# Patient Record
Sex: Male | Born: 1955
Health system: Southern US, Community
[De-identification: ages and names within clinical notes are randomized; demographics above are authoritative.]

## PROBLEM LIST (undated history)

## (undated) DIAGNOSIS — E669 Obesity, unspecified: Secondary | ICD-10-CM

## (undated) DIAGNOSIS — E785 Hyperlipidemia, unspecified: Secondary | ICD-10-CM

## (undated) DIAGNOSIS — D171 Benign lipomatous neoplasm of skin and subcutaneous tissue of trunk: Secondary | ICD-10-CM

## (undated) DIAGNOSIS — Z9289 Personal history of other medical treatment: Secondary | ICD-10-CM

## (undated) DIAGNOSIS — I10 Essential (primary) hypertension: Secondary | ICD-10-CM

## (undated) DIAGNOSIS — I2510 Atherosclerotic heart disease of native coronary artery without angina pectoris: Secondary | ICD-10-CM

## (undated) DIAGNOSIS — I441 Atrioventricular block, second degree: Secondary | ICD-10-CM

## (undated) DIAGNOSIS — G4733 Obstructive sleep apnea (adult) (pediatric): Secondary | ICD-10-CM

## (undated) DIAGNOSIS — M549 Dorsalgia, unspecified: Secondary | ICD-10-CM

## (undated) DIAGNOSIS — Z7902 Long term (current) use of antithrombotics/antiplatelets: Secondary | ICD-10-CM

## (undated) DIAGNOSIS — F329 Major depressive disorder, single episode, unspecified: Secondary | ICD-10-CM

## (undated) DIAGNOSIS — G4730 Sleep apnea, unspecified: Secondary | ICD-10-CM

## (undated) DIAGNOSIS — F419 Anxiety disorder, unspecified: Secondary | ICD-10-CM

## (undated) DIAGNOSIS — B269 Mumps without complication: Secondary | ICD-10-CM

## (undated) DIAGNOSIS — B059 Measles without complication: Secondary | ICD-10-CM

## (undated) DIAGNOSIS — K219 Gastro-esophageal reflux disease without esophagitis: Secondary | ICD-10-CM

## (undated) DIAGNOSIS — M109 Gout, unspecified: Secondary | ICD-10-CM

## (undated) DIAGNOSIS — F3289 Other specified depressive episodes: Secondary | ICD-10-CM

## (undated) DIAGNOSIS — M1990 Unspecified osteoarthritis, unspecified site: Secondary | ICD-10-CM

## (undated) HISTORY — DX: Mumps without complication: B26.9

## (undated) HISTORY — PX: KNEE SURGERY: SHX244

## (undated) HISTORY — DX: Atrioventricular block, second degree: I44.1

## (undated) HISTORY — DX: Personal history of other medical treatment: Z92.89

## (undated) HISTORY — DX: Hyperlipidemia, unspecified: E78.5

## (undated) HISTORY — PX: ANTERIOR FUSION LUMBAR SPINE: SUR629

## (undated) HISTORY — DX: Essential (primary) hypertension: I10

## (undated) HISTORY — DX: Atherosclerotic heart disease of native coronary artery without angina pectoris: I25.10

## (undated) HISTORY — DX: Unspecified osteoarthritis, unspecified site: M19.90

## (undated) HISTORY — DX: Major depressive disorder, single episode, unspecified: F32.9

## (undated) HISTORY — DX: Sleep apnea, unspecified: G47.30

## (undated) HISTORY — DX: Gout, unspecified: M10.9

## (undated) HISTORY — DX: Measles without complication: B05.9

## (undated) HISTORY — DX: Benign lipomatous neoplasm of skin and subcutaneous tissue of trunk: D17.1

## (undated) HISTORY — DX: Anxiety disorder, unspecified: F41.9

## (undated) HISTORY — DX: Other specified depressive episodes: F32.89

## (undated) HISTORY — DX: Dorsalgia, unspecified: M54.9

## (undated) HISTORY — PX: TONSILLECTOMY AND ADENOIDECTOMY: SUR1326

## (undated) HISTORY — PX: TOTAL SHOULDER REPLACEMENT: SUR1217

## (undated) HISTORY — DX: Other hemochromatosis: E83.118

---

## 2002-06-24 ENCOUNTER — Encounter: Payer: Self-pay | Admitting: Neurosurgery

## 2002-06-24 ENCOUNTER — Encounter: Admission: RE | Admit: 2002-06-24 | Discharge: 2002-06-24 | Payer: Self-pay | Admitting: Neurosurgery

## 2002-07-08 ENCOUNTER — Encounter: Payer: Self-pay | Admitting: Neurosurgery

## 2002-07-08 ENCOUNTER — Encounter: Admission: RE | Admit: 2002-07-08 | Discharge: 2002-07-08 | Payer: Self-pay | Admitting: Neurosurgery

## 2002-07-23 ENCOUNTER — Encounter: Admission: RE | Admit: 2002-07-23 | Discharge: 2002-07-23 | Payer: Self-pay | Admitting: Neurosurgery

## 2002-07-23 ENCOUNTER — Encounter: Payer: Self-pay | Admitting: Neurosurgery

## 2003-01-23 HISTORY — PX: COLONOSCOPY: SHX174

## 2003-07-14 ENCOUNTER — Encounter: Admission: RE | Admit: 2003-07-14 | Discharge: 2003-07-14 | Payer: Self-pay | Admitting: Neurosurgery

## 2003-08-02 ENCOUNTER — Encounter: Admission: RE | Admit: 2003-08-02 | Discharge: 2003-08-02 | Payer: Self-pay | Admitting: Neurosurgery

## 2003-08-23 ENCOUNTER — Encounter: Admission: RE | Admit: 2003-08-23 | Discharge: 2003-08-23 | Payer: Self-pay | Admitting: Neurosurgery

## 2003-11-02 ENCOUNTER — Ambulatory Visit (HOSPITAL_COMMUNITY): Admission: RE | Admit: 2003-11-02 | Discharge: 2003-11-03 | Payer: Self-pay | Admitting: Neurosurgery

## 2005-02-04 ENCOUNTER — Emergency Department (HOSPITAL_COMMUNITY): Admission: EM | Admit: 2005-02-04 | Discharge: 2005-02-04 | Payer: Self-pay | Admitting: Emergency Medicine

## 2006-08-07 ENCOUNTER — Encounter: Admission: RE | Admit: 2006-08-07 | Discharge: 2006-08-07 | Payer: Self-pay | Admitting: Orthopaedic Surgery

## 2006-09-01 ENCOUNTER — Encounter: Admission: RE | Admit: 2006-09-01 | Discharge: 2006-09-01 | Payer: Self-pay | Admitting: Neurosurgery

## 2007-02-09 ENCOUNTER — Encounter: Admission: RE | Admit: 2007-02-09 | Discharge: 2007-02-09 | Payer: Self-pay | Admitting: Neurosurgery

## 2008-01-23 DIAGNOSIS — T8859XA Other complications of anesthesia, initial encounter: Secondary | ICD-10-CM

## 2008-01-23 HISTORY — DX: Other complications of anesthesia, initial encounter: T88.59XA

## 2008-05-28 ENCOUNTER — Encounter: Admission: RE | Admit: 2008-05-28 | Discharge: 2008-05-28 | Payer: Self-pay | Admitting: Neurosurgery

## 2010-01-22 HISTORY — PX: BACK SURGERY: SHX140

## 2010-06-09 NOTE — H&P (Signed)
NAMEKENDELL, SAGRAVES NO.:  [id number]   MEDICAL RECORD NO.:  [id number]          PATIENT TYPE:  OIB   LOCATION:  3013                         FACILITY:  [facility]   PHYSICIAN:  Payton Doughty, M.D.      DATE OF BIRTH:  10-15-1955   DATE OF ADMISSION:  11/02/2003  DATE OF DISCHARGE:  11/03/2003                                HISTORY & PHYSICAL   ADMISSION DIAGNOSIS:  Herniated disc on the right side at L4-L5.   HISTORY OF PRESENT ILLNESS:  This is a 55 year old, right-handed, white  gentleman who has had back pain for a number of years.  I actually saw him 1-  1/2 years ago with pain in his back with degenerative change.  He got  epidural steroids and did reasonably well.  I saw him back in August with  increased pain down his right leg with foraminal disc on the right.  He is  now admitted for a right foraminal diskectomy at L4-L5.   PAST MEDICAL HISTORY:  Reflux.   MEDICATIONS:  1.  Antiinflammatory twice a Trela.  2.  Nexium 40 mg a Roller.   ALLERGIES:  No known drug allergies.   PAST SURGICAL HISTORY:  1.  Left knee reconstruction in 2000.  2.  Knee scope in 2003.  3.  Shoulder operation in 1994.   SOCIAL HISTORY:  He does not smoke.  Drinks occasionally on a social basis.  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   FAMILY HISTORY:  Parents are deceased.  History is not given.   REVIEW OF SYSTEMS:  MUSCULOSKELETAL:  Leg pain while walking.  Broken leg at  the age of 40.  Leg weakness, back pain, joint pain and arthritis.  HEENT:  Within normal limits.  He has reasonable range of motion in neck.  CHEST:  Clear.  CARDIAC:  Regular rate and rhythm.  ABDOMEN:  Nontender with no hepatosplenomegaly.  EXTREMITIES:  Without clubbing, cyanosis or edema.  Peripheral pulses are  good.  GENITALIA:  Deferred.  NEUROLOGIC:  He is awake, alert and oriented.  His cranial nerves are  intact.  Motor exam shows 5/5 strength throughout the upper and lower  extremities with the dorsiflexors of the right foot and knee extensors in  the right leg which are pain limited 5-/5.  He has sensory dysesthesias in  the L5-S1 distribution.  Reflexes are 2 at the left knee, 1 at the right, 1  at the right ankle, absent at the left ankle.  Straight leg raise is  positive on the right.  Reverse straight leg raise is not positive.  MR  demonstrates spondylitic changes at L3-L4 and L4-L5 and L5-S1 and he has a  foraminal disc at L4-L5 eccentric to the right with compression at both the  L4-L5 root.   IMPRESSION:  Right L4 radiculopathy related to foraminal disc at L4-L5 on  the right.   PLAN:  Foraminal diskectomy on the right side at L4-L5.  The risks and  benefits of this approach have been discussed with him  and wishes to  proceed.       MWR/MEDQ  D:  11/02/2003  T:  11/02/2003  Job:  [id number]

## 2010-06-09 NOTE — Op Note (Signed)
NAMELORENZA, WINKLEMAN NO.:  [id number]   MEDICAL RECORD NO.:  [id number]          PATIENT TYPE:  OIB   LOCATION:  2899                         FACILITY:  [facility]   PHYSICIAN:  Payton Doughty, M.D.      DATE OF BIRTH:  Jun 02, 1955   DATE OF PROCEDURE:  11/02/2003  DATE OF DISCHARGE:                                 OPERATIVE REPORT   PREOPERATIVE DIAGNOSIS:  Herniated disk at L4-5 in the foraminal position on  the right side.   POSTOPERATIVE DIAGNOSIS:  Herniated disk at L4-5 in the foraminal position  on the right side.   OPERATION:  Right L4-5 extraforaminal diskectomy.   SURGEON:  Payton Doughty, M.D.   ANESTHESIA:  General endotracheal.   PREPARATION:  Prepped and sterilely draped with alcohol wipe.   COMPLICATIONS:  None.   NURSE ASSISTANT:  [place].   PHYSICIAN ASSISTANT:  Hewitt Shorts, M.D.   INDICATIONS FOR PROCEDURE:  This 55 year old gentleman with a right L4  radiculopathy related to foraminal narrowing and disk at L4-5 on the right.  He is taken to the operating room and smoothly anesthetized and intubated,  and placed prone on the operating room table.  Following a shave, prep and  drape in the usual sterile fashion, the skin was then prepared with 1%  lidocaine with 1:400,000 epinephrine.  A localizing needle was placed and an  x-ray obtained.  The skin was incised from the top of L4 to the bottom of  L4, the lamina of L4, and the transverse processes of L4 and L5 were exposed  on the right side in the subperiosteal plane.  Intraoperative x-ray once  again confirmed the correctness of the level.  Using the high-speed drill, a  bit of the superior part of the inferior facet of L5, as well as the lateral  portion of the pars interarticularis was removed.  The superior end of the  superior facet of L5 was removed.  This allowed access to the right L4  dorsal ganglion, after the removal of the ligament of flavum.  The dorsal  ganglion was  carefully dissected free, mobilized slightly inferiorly.  This  allowed access to the disk space.  Entrance into the disk space produced a  small amount of disk material, but no large fragment; however, the  decompressive portion allowed marked decompression of the dorsal ganglion.  The nerve root was carefully explored both medially into the canal and  laterally into the extra-spinous tissues and found to be free.  The wound  was irrigated and hemostasis assured.  The defect in the bone was filled  with Depo-Medrol-soaked fat.  The fascia was reapproximated with #0 Vicryl  in an interrupted fashion.  The subcutaneous tissues were  reapproximated with #0 Vicryl in an interrupted fashion.  The skin was  closed with #3-0 nylon in a simple running fashion.  Betadine and a Telfa  dressing were applied and made occlusive with Op-Site.  The patient returned to the recovery room in good condition.       MWR/MEDQ  D:  11/02/2003  T:  11/02/2003  Job:  [id number]

## 2010-07-12 ENCOUNTER — Ambulatory Visit: Payer: Self-pay | Admitting: General Practice

## 2010-10-16 DIAGNOSIS — M67919 Unspecified disorder of synovium and tendon, unspecified shoulder: Secondary | ICD-10-CM | POA: Insufficient documentation

## 2011-01-03 DIAGNOSIS — M19019 Primary osteoarthritis, unspecified shoulder: Secondary | ICD-10-CM | POA: Insufficient documentation

## 2011-04-03 ENCOUNTER — Other Ambulatory Visit: Payer: Self-pay | Admitting: Neurosurgery

## 2011-04-03 DIAGNOSIS — M47817 Spondylosis without myelopathy or radiculopathy, lumbosacral region: Secondary | ICD-10-CM

## 2011-05-16 ENCOUNTER — Ambulatory Visit: Payer: Self-pay | Admitting: Internal Medicine

## 2011-08-13 ENCOUNTER — Encounter: Payer: Self-pay | Admitting: Cardiovascular Disease

## 2011-08-20 ENCOUNTER — Encounter: Payer: Self-pay | Admitting: Cardiovascular Disease

## 2011-08-20 ENCOUNTER — Ambulatory Visit (INDEPENDENT_AMBULATORY_CARE_PROVIDER_SITE_OTHER): Payer: BC Managed Care – PPO | Admitting: Cardiovascular Disease

## 2011-08-20 VITALS — BP 134/84 | HR 66 | Ht 72.0 in | Wt 227.2 lb

## 2011-08-20 DIAGNOSIS — E785 Hyperlipidemia, unspecified: Secondary | ICD-10-CM

## 2011-08-20 DIAGNOSIS — I499 Cardiac arrhythmia, unspecified: Secondary | ICD-10-CM | POA: Insufficient documentation

## 2011-08-20 DIAGNOSIS — E782 Mixed hyperlipidemia: Secondary | ICD-10-CM | POA: Insufficient documentation

## 2011-08-20 MED ORDER — ATORVASTATIN CALCIUM 20 MG PO TABS: 20.0000 mg | ORAL_TABLET | Freq: Every day | ORAL | Status: DC

## 2011-08-20 NOTE — Progress Notes (Signed)
Patient ID: Robert Greer, male    DOB: 1955/08/22, 56 y.o.   MRN: [id number]  HPI Comments: Mr. 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  He reports in the setting of his anxiety and depression, was noted to have abnormal heart rhythm. He reports feeling palpitations on  rare episodes. Mostly he appreciated the abnormal rhythm by feeling his pulse. No dizziness, near syncope or syncope, no lightheadedness. Otherwise his energy has been good with no complaints. No chest pains or shortness of breath.  He has been on cholesterol medication for several years Recent lab work showed total cholesterol 110, LDL 41, HDL 48. This is on Lipitor 10 mg daily  He reports that his sleep is better. When the economy he was not doing well, he had significant anxiety  Several EKGs are provided today from Dr. Juanetta Gosling and [facility].  EKG today shows normal sinus rhythm with no significant ST or T wave changes, no ectopy Longer rhythm strip was also run that showed no significant abnormality, no arrhythmia  Review of previous EKGs showed sinus arrhythmia, APCs, frequently running in triplets,  Does not appear to be second-degree AV block type 2 as there are not clear nonconducted P waves. Original EKGs not available and image quality is fair  He had a normal stress test and echocardiogram. Echocardiogram report was not completed, ejection fraction unavailable but estimated at normal, 60% with no wall motion abnormality on stress test   Outpatient Encounter Prescriptions as of 08/20/2011  Medication Sig Dispense Refill  . atorvastatin (LIPITOR) 20 MG tablet Take 1 tablet (20 mg total) by mouth daily.  90 tablet  3  . cetirizine (ZYRTEC) 10 MG tablet Take 10 mg by mouth as needed.      Marland Kitchen escitalopram (LEXAPRO) 10 MG tablet Take 10 mg by mouth daily.       . meloxicam (MOBIC) 7.5 MG tablet Take 7.5 mg by mouth 2 (two) times daily.      . Multiple Vitamin (MULTIVITAMIN) tablet Take 1 tablet by mouth daily.       Review of Systems  Constitutional: Negative.   HENT: Negative.   Eyes: Negative.   Respiratory: Negative.   Cardiovascular: Negative.   Gastrointestinal: Negative.   Musculoskeletal: Negative.   Skin: Negative.   Neurological: Negative.   Hematological: Negative.   Psychiatric/Behavioral: Negative.   All other systems reviewed and are negative.    BP 134/84  Pulse 66  Ht 6' (1.829 m)  Wt 227 lb 4 oz (103.08 kg)  BMI 30.82 kg/m2  Physical Exam  Nursing note and vitals reviewed. Constitutional: He is oriented to person, place, and time. He appears well-developed and well-nourished.  HENT:  Head: Normocephalic.  Nose: Nose normal.  Mouth/Throat: Oropharynx is clear and moist.  Eyes: Conjunctivae are normal. Pupils are equal, round, and reactive to light.  Neck: Normal range of motion. Neck supple. No JVD present.  Cardiovascular: Normal rate, regular rhythm, S1 normal, S2 normal, normal heart sounds and intact distal pulses.  Exam reveals no gallop and no friction rub.   No murmur heard. Pulmonary/Chest: Effort normal and breath sounds normal. No respiratory distress. He has no wheezes. He has no rales. He exhibits no tenderness.  Abdominal: Soft. Bowel sounds are normal. He exhibits no distension. There is no tenderness.  Musculoskeletal: Normal range of motion. He  exhibits no edema and no tenderness.  Lymphadenopathy:    He has no cervical adenopathy.  Neurological: He is alert and oriented to person, place, and time. Coordination normal.  Skin: Skin is warm and dry. No rash noted. No erythema.  Psychiatric: He has a normal mood and affect. His behavior is normal. Judgment and thought content normal.           Assessment and Plan

## 2011-08-20 NOTE — Patient Instructions (Addendum)
You are doing well. No medication changes were made.  Please call us if you have new issues that need to be addressed before your next appt.  Your physician wants you to follow-up in: 12 months.  You will receive a reminder letter in the mail two months in advance. If you don't receive a letter, please call our office to schedule the follow-up appointment. 

## 2011-08-20 NOTE — Assessment & Plan Note (Signed)
Cholesterol is at goal on the current lipid regimen. No changes to the medications were made.  

## 2011-08-20 NOTE — Assessment & Plan Note (Signed)
No arrhythmia on today's visit. He feels it was brought on by stress and anxiety in the winter of 2012. He feels much better now. He reports not being able to feel any arrhythmia for quite some time.  Evaluation of the EKGs is concerning for sinus arrhythmia. Unable to clearly see consistent drop atrial conduction consistent with second-degree AV block type II. I suspect he had sinus arrhythmia, APCs, possible sinus pauses. As he was asymptomatic and arrhythmia has resolved, no intervention is needed. No further workup needed as he has had stress test and echo.

## 2012-08-26 ENCOUNTER — Telehealth: Payer: Self-pay | Admitting: *Deleted

## 2012-08-26 NOTE — Telephone Encounter (Signed)
Lmom to schedule a overdue appt.

## 2013-05-04 ENCOUNTER — Ambulatory Visit: Payer: Self-pay | Admitting: Neurosurgery

## 2013-06-17 DIAGNOSIS — C44321 Squamous cell carcinoma of skin of nose: Secondary | ICD-10-CM | POA: Insufficient documentation

## 2013-12-21 ENCOUNTER — Other Ambulatory Visit: Payer: Self-pay

## 2013-12-21 NOTE — Telephone Encounter (Signed)
Refill sent for atorvastatin. 

## 2014-01-22 DIAGNOSIS — I213 ST elevation (STEMI) myocardial infarction of unspecified site: Secondary | ICD-10-CM

## 2014-01-22 HISTORY — DX: ST elevation (STEMI) myocardial infarction of unspecified site: I21.3

## 2014-06-29 ENCOUNTER — Other Ambulatory Visit: Payer: Self-pay

## 2014-06-29 NOTE — Telephone Encounter (Signed)
Refill denied for atorvastatin. Patient should make a follow up with Dr. Rockey Situ.

## 2014-06-30 ENCOUNTER — Ambulatory Visit (INDEPENDENT_AMBULATORY_CARE_PROVIDER_SITE_OTHER): Payer: BLUE CROSS/BLUE SHIELD | Admitting: Family Medicine

## 2014-06-30 ENCOUNTER — Encounter: Payer: Self-pay | Admitting: Family Medicine

## 2014-06-30 VITALS — BP 133/81 | HR 79 | Temp 98.3°F | Resp 16 | Ht 72.0 in | Wt 237.6 lb

## 2014-06-30 DIAGNOSIS — M5442 Lumbago with sciatica, left side: Secondary | ICD-10-CM | POA: Diagnosis not present

## 2014-06-30 DIAGNOSIS — M545 Low back pain, unspecified: Secondary | ICD-10-CM | POA: Insufficient documentation

## 2014-06-30 DIAGNOSIS — E785 Hyperlipidemia, unspecified: Secondary | ICD-10-CM | POA: Diagnosis not present

## 2014-06-30 DIAGNOSIS — I499 Cardiac arrhythmia, unspecified: Secondary | ICD-10-CM | POA: Diagnosis not present

## 2014-06-30 DIAGNOSIS — R009 Unspecified abnormalities of heart beat: Secondary | ICD-10-CM

## 2014-06-30 DIAGNOSIS — M5441 Lumbago with sciatica, right side: Secondary | ICD-10-CM

## 2014-06-30 DIAGNOSIS — I470 Re-entry ventricular arrhythmia: Secondary | ICD-10-CM | POA: Diagnosis not present

## 2014-06-30 DIAGNOSIS — G8929 Other chronic pain: Secondary | ICD-10-CM | POA: Insufficient documentation

## 2014-06-30 LAB — EKG 12-LEAD

## 2014-06-30 NOTE — Progress Notes (Signed)
Name: Robert Greer   MRN: [id number]    DOB: 07/13/1955   Date:06/30/2014       Progress Note  Subjective  Chief Complaint  Chief Complaint  Patient presents with  . Annual Exam  . Depression  . Back Pain  . Hyperlipidemia    HPI  Here for annual physical. He has chronic back pain and sees Dr. Carloyn Manner in [place].  Also has seen Dr. Rockey Situ re: card arrhythmia.  His depression is under good control.   No problem-specific assessment & plan notes found for this encounter.   Past Medical History  Diagnosis Date  . Hyperlipidemia   . Anxiety   . Mobitz (type) II atrioventricular block   . Arthritis   . Backache, unspecified   . Depressive disorder, not elsewhere classified   . Hypertension   . Mumps   . Measles     Past Surgical History  Procedure Laterality Date  . Back surgery  2012    x2  . Tonsillectomy and adenoidectomy    . Knee surgery      bilateral  . Anterior fusion lumbar spine      History reviewed. No pertinent family history.  History   Social History  . Marital Status: Married    Spouse Name: N/A  . Number of Children: N/A  . Years of Education: N/A   Occupational History  . Not on file.   Social History Main Topics  . Smoking status: Never Smoker   . Smokeless tobacco: Current User    Types: Snuff  . Alcohol Use: 1.2 - 1.8 oz/week    2-3 Cans of beer per week     Comment: ocassionally  . Drug Use: No  . Sexual Activity: Not on file   Other Topics Concern  . Not on file   Social History Narrative     Current outpatient prescriptions:  .  atorvastatin (LIPITOR) 20 MG tablet, Take 1 tablet (20 mg total) by mouth daily., Disp: 90 tablet, Rfl: 3 .  cetirizine (ZYRTEC) 10 MG tablet, Take 10 mg by mouth as needed., Disp: , Rfl:  .  DULoxetine (CYMBALTA) 20 MG capsule, , Disp: , Rfl: 0 .  meloxicam (MOBIC) 7.5 MG tablet, Take 7.5 mg by mouth 2 (two) times daily., Disp: , Rfl:  .  Multiple Vitamin (MULTIVITAMIN) tablet, Take 1 tablet by mouth  daily., Disp: , Rfl:  .  oxyCODONE (OXY IR/ROXICODONE) 5 MG immediate release tablet, Take 5 mg by mouth., Disp: , Rfl:   No Known Allergies   Review of Systems  Constitutional: Negative.  Negative for fever, weight loss and malaise/fatigue.  HENT: Negative for congestion and sore throat.   Eyes: Negative.  Negative for double vision.  Respiratory: Negative.  Negative for cough, shortness of breath and wheezing.   Cardiovascular: Negative.   Gastrointestinal: Negative.  Negative for heartburn, nausea, vomiting, diarrhea, constipation and blood in stool.  Genitourinary: Negative.  Negative for dysuria and frequency.  Musculoskeletal: Positive for back pain and neck pain.  Skin: Negative.  Negative for rash.  Neurological: Negative.  Negative for tingling, seizures, loss of consciousness, weakness and headaches.  Endo/Heme/Allergies: Negative.  Negative for polydipsia.  Psychiatric/Behavioral: Negative.  Negative for depression. The patient is not nervous/anxious and does not have insomnia.       Objective  Filed Vitals:   06/30/14 1412  BP: 133/81  Pulse: 79  Temp: 98.3 F (36.8 C)  TempSrc: Oral  Resp: 16  Height: 6' (1.829  m)  Weight: 237 lb 9.6 oz (107.775 kg)    Physical Exam  Constitutional: He is oriented to person, place, and time and well-developed, well-nourished, and in no distress. No distress.  HENT:  Head: Normocephalic and atraumatic.  Right Ear: External ear normal.  Left Ear: External ear normal.  Nose: Nose normal.  Mouth/Throat: Oropharynx is clear and moist.  Eyes: Conjunctivae are normal. Pupils are equal, round, and reactive to light. No scleral icterus.  Neck: Normal range of motion. Neck supple. No tracheal deviation present. No thyromegaly present.  Cardiovascular: Normal rate, regular rhythm, normal heart sounds and intact distal pulses.  Exam reveals no gallop and no friction rub.   No murmur heard. Pulmonary/Chest: Breath sounds normal. No  respiratory distress. He has no wheezes. He has no rales. He exhibits no tenderness.  Abdominal: Soft. Bowel sounds are normal. He exhibits no mass. There is no tenderness.  Genitourinary: Rectum normal, prostate normal and penis normal. No discharge found.  Musculoskeletal: Normal range of motion. He exhibits no edema.  Lymphadenopathy:    He has no cervical adenopathy.  Neurological: He is alert and oriented to person, place, and time.  Skin: Skin is warm and dry. No rash noted.  Psychiatric: Memory, affect and judgment normal.  Vitals reviewed.     Recent Results (from the past 2160 hour(s))  EKG 12-Lead     Status: Normal   Collection Time: 06/30/14  3:30 PM  Result Value Ref Range                                . SR PACS     Comment: NSR with PACs.     Assessment & Plan  Problem List Items Addressed This Visit      Cardiovascular and Mediastinum   Arrhythmia     Other   Low back pain (Chronic)   Relevant Medications   oxyCODONE (OXY IR/ROXICODONE) 5 MG immediate release tablet   Hyperlipidemia   Relevant Orders   Lipid Profile   COMPLETE METABOLIC PANEL WITH GFR    Other Visit Diagnoses    Heart beat abnormality    -  Primary    Relevant Orders    CBC with Differential    EKG 12-Lead (Completed)       Meds ordered this encounter  Medications  . DISCONTD: HYDROcodone-acetaminophen (NORCO/VICODIN) 5-325 MG per tablet    Sig: Take by mouth.  . oxyCODONE (OXY IR/ROXICODONE) 5 MG immediate release tablet    Sig: Take 5 mg by mouth.  . DISCONTD: meloxicam (MOBIC) 7.5 MG tablet    Sig: Take 7.5 mg by mouth.  . DULoxetine (CYMBALTA) 20 MG capsule    Sig:     Refill:  0

## 2014-08-25 ENCOUNTER — Encounter: Payer: Self-pay | Admitting: Cardiovascular Disease

## 2014-08-25 ENCOUNTER — Encounter (INDEPENDENT_AMBULATORY_CARE_PROVIDER_SITE_OTHER): Payer: Self-pay

## 2014-08-25 ENCOUNTER — Ambulatory Visit (INDEPENDENT_AMBULATORY_CARE_PROVIDER_SITE_OTHER): Payer: BLUE CROSS/BLUE SHIELD | Admitting: Cardiovascular Disease

## 2014-08-25 VITALS — BP 130/78 | HR 77 | Ht 72.0 in | Wt 239.2 lb

## 2014-08-25 DIAGNOSIS — E785 Hyperlipidemia, unspecified: Secondary | ICD-10-CM | POA: Diagnosis not present

## 2014-08-25 DIAGNOSIS — I470 Re-entry ventricular arrhythmia: Secondary | ICD-10-CM

## 2014-08-25 NOTE — Assessment & Plan Note (Signed)
He denies any palpitations concerning for arrhythmia. Suspect prior palpitations were in the setting of anxiety/stress. No changes needed to the medications. No further testing needed

## 2014-08-25 NOTE — Progress Notes (Signed)
Patient ID: Robert Greer, male    DOB: March 03, 1955, 59 y.o.   MRN: [id number]  HPI Comments: Mr. 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 Denies having any problems with depression or anxiety, this was during the period of the recession During this time he had palpitations. Since then he has felt well with no further arrhythmia or palpitations No chest pains or shortness of breath He has been on cholesterol medication for several years Most recent lab work not available, this has been done through the city   on Lipitor 20 mg daily, total cholesterol 165, HDL 58, LDL 87, TSH 3.6  EKG on today's visit shows normal sinus rhythm with rate 77 bpm, no significant ST or T-wave changes  Other past medical history Several EKGs are provided today from Dr. Luan Pulling and [facility]  EKG today shows normal sinus rhythm with no significant ST or T wave changes, no ectopy Longer rhythm strip was also run that showed no significant abnormality, no arrhythmia  Review of previous EKGs showed sinus arrhythmia, APCs, frequently running in triplets,  Does not appear to be second-degree AV block type 2 as there are not clear nonconducted P waves. Original EKGs not available and image quality is fair  He had a normal stress test and echocardiogram. Echocardiogram report was not completed, ejection fraction unavailable but estimated at normal, 60% with no wall motion abnormality on stress test  No  Known Allergies  Current Outpatient Prescriptions on File Prior to Visit  Medication Sig Dispense Refill  . atorvastatin (LIPITOR) 20 MG tablet Take 1 tablet (20 mg total) by mouth daily. 90 tablet 3  . cetirizine (ZYRTEC) 10 MG tablet Take 10 mg by mouth as needed.    . DULoxetine (CYMBALTA) 20 MG capsule Take 20 mg by mouth 2 (two) times daily.   0  . meloxicam (MOBIC) 7.5 MG tablet Take 7.5 mg by mouth 2 (two) times daily.    . Multiple Vitamin (MULTIVITAMIN) tablet Take 1 tablet by mouth daily.    Marland Kitchen oxyCODONE (OXY IR/ROXICODONE) 5 MG immediate release tablet Take 5 mg by mouth daily as needed.      No current facility-administered medications on file prior to visit.    Past Medical History  Diagnosis Date  . Hyperlipidemia   . Anxiety   . Mobitz (type) II atrioventricular block   . Arthritis   . Backache, unspecified   . Depressive disorder, not elsewhere classified   . Hypertension   . Mumps   . Measles     Past Surgical History  Procedure Laterality Date  . Back surgery  2012    x2  . Tonsillectomy and adenoidectomy    . Knee surgery      bilateral  . Anterior fusion lumbar spine    . Total shoulder replacement Right     Social History  reports that he has never smoked. His smokeless tobacco use includes Snuff. He reports that he drinks about 1.2 - 1.8 oz of  alcohol per week. He reports that he does not use illicit drugs.  Family History family history is not on file.   Review of Systems  Constitutional: Negative.   Respiratory: Negative.   Cardiovascular: Negative.   Gastrointestinal: Negative.   Musculoskeletal: Positive for arthralgias.  Skin: Negative.   Neurological: Negative.   Psychiatric/Behavioral: Negative.   All other systems reviewed and are negative.   BP 130/78 mmHg  Pulse 77  Ht 6' (1.829 m)  Wt 239 lb 4 oz (108.523 kg)  BMI 32.44 kg/m2  Physical Exam  Constitutional: He is oriented to person, place, and time. He appears  well-developed and well-nourished.  HENT:  Head: Normocephalic.  Nose: Nose normal.  Mouth/Throat: Oropharynx is clear and moist.  Eyes: Conjunctivae are normal. Pupils are equal, round, and reactive to light.  Neck: Normal range of motion. Neck supple. No JVD present.  Cardiovascular: Normal rate, regular rhythm, S1 normal, S2 normal, normal heart sounds and intact distal pulses.  Exam reveals no gallop and no friction rub.   No murmur heard. Pulmonary/Chest: Effort normal and breath sounds normal. No respiratory distress. He has no wheezes. He has no rales. He exhibits no tenderness.  Abdominal: Soft. Bowel sounds are normal. He exhibits no distension. There is no tenderness.  Musculoskeletal: Normal range of motion. He exhibits no edema or tenderness.  Lymphadenopathy:    He has no cervical adenopathy.  Neurological: He is alert and oriented to person, place, and time. Coordination normal.  Skin: Skin is warm and dry. No rash noted. No erythema.  Psychiatric: He has a normal mood and affect. His behavior is normal. Judgment and thought content normal.      Assessment and Plan   Nursing note and vitals reviewed.

## 2014-08-25 NOTE — Patient Instructions (Signed)
You are doing well. No medication changes were made.  Please call us if you have new issues that need to be addressed before your next appt.  Your physician wants you to follow-up in: 12 months.  You will receive a reminder letter in the mail two months in advance. If you don't receive a letter, please call our office to schedule the follow-up appointment. 

## 2014-08-25 NOTE — Assessment & Plan Note (Addendum)
Cholesterol labs were requested from primary care doctor's office, Dr. Luan Pulling . These numbers were reviewed.  We will Encourage him to stay on his current dose of Lipitor. Cholesterol under excellent control No known coronary artery disease If he is concerned about underlying coronary artery disease, coronary calcium score could be ordered in [place].

## 2014-08-25 NOTE — Addendum Note (Signed)
Addended by: Minna Merritts on: 08/25/2014 04:44 PM   Modules accepted: Level of Service

## 2014-10-27 ENCOUNTER — Telehealth: Payer: Self-pay | Admitting: Family Medicine

## 2014-10-27 NOTE — Telephone Encounter (Signed)
Advised as dr. Luan Pulling-- his stung was from yesterday an Left arm and he will follow advise as per Dr. Luan Pulling and suggested to call office if not resolved  by week.

## 2014-10-27 NOTE — Telephone Encounter (Signed)
Cont. Benadryl, 1-2 up to 4 times a Mahadeo as need.  Use ice to swollen area 4 times a Brickel.-jh

## 2014-10-27 NOTE — Telephone Encounter (Signed)
LMTCB

## 2014-10-27 NOTE — Telephone Encounter (Signed)
Pt was stung by wasp on his hand.  His hand is swelling and going up arm.  He is taking benadryl but doesn't seem to be helping.  Please call [phone]

## 2015-01-07 ENCOUNTER — Encounter: Payer: Self-pay | Admitting: Emergency Medicine

## 2015-01-07 ENCOUNTER — Emergency Department: Payer: Medicare Other

## 2015-01-07 ENCOUNTER — Other Ambulatory Visit: Payer: Self-pay | Admitting: *Deleted

## 2015-01-07 ENCOUNTER — Inpatient Hospital Stay
Admission: EM | Admit: 2015-01-07 | Discharge: 2015-01-11 | DRG: 247 | Disposition: A | Discharge status: Home / Self Care | Payer: Medicare Other | Attending: Internal Medicine | Admitting: Internal Medicine

## 2015-01-07 DIAGNOSIS — R079 Chest pain, unspecified: Secondary | ICD-10-CM

## 2015-01-07 DIAGNOSIS — R7989 Other specified abnormal findings of blood chemistry: Secondary | ICD-10-CM | POA: Diagnosis not present

## 2015-01-07 DIAGNOSIS — E785 Hyperlipidemia, unspecified: Secondary | ICD-10-CM | POA: Diagnosis present

## 2015-01-07 DIAGNOSIS — Z79899 Other long term (current) drug therapy: Secondary | ICD-10-CM | POA: Diagnosis not present

## 2015-01-07 DIAGNOSIS — F17290 Nicotine dependence, other tobacco product, uncomplicated: Secondary | ICD-10-CM | POA: Diagnosis present

## 2015-01-07 DIAGNOSIS — I214 Non-ST elevation (NSTEMI) myocardial infarction: Secondary | ICD-10-CM

## 2015-01-07 DIAGNOSIS — F329 Major depressive disorder, single episode, unspecified: Secondary | ICD-10-CM | POA: Diagnosis present

## 2015-01-07 DIAGNOSIS — Z96611 Presence of right artificial shoulder joint: Secondary | ICD-10-CM | POA: Diagnosis present

## 2015-01-07 DIAGNOSIS — I2510 Atherosclerotic heart disease of native coronary artery without angina pectoris: Secondary | ICD-10-CM | POA: Diagnosis not present

## 2015-01-07 DIAGNOSIS — M1990 Unspecified osteoarthritis, unspecified site: Secondary | ICD-10-CM | POA: Diagnosis present

## 2015-01-07 DIAGNOSIS — I10 Essential (primary) hypertension: Secondary | ICD-10-CM | POA: Diagnosis present

## 2015-01-07 DIAGNOSIS — I441 Atrioventricular block, second degree: Secondary | ICD-10-CM | POA: Diagnosis present

## 2015-01-07 DIAGNOSIS — M549 Dorsalgia, unspecified: Secondary | ICD-10-CM | POA: Diagnosis present

## 2015-01-07 DIAGNOSIS — R072 Precordial pain: Secondary | ICD-10-CM

## 2015-01-07 DIAGNOSIS — I200 Unstable angina: Secondary | ICD-10-CM | POA: Diagnosis not present

## 2015-01-07 DIAGNOSIS — I252 Old myocardial infarction: Secondary | ICD-10-CM

## 2015-01-07 DIAGNOSIS — R778 Other specified abnormalities of plasma proteins: Secondary | ICD-10-CM

## 2015-01-07 HISTORY — DX: Non-ST elevation (NSTEMI) myocardial infarction: I21.4

## 2015-01-07 LAB — BASIC METABOLIC PANEL
Anion gap: 8 (ref 5–15)
BUN: 17 mg/dL (ref 6–20)
CHLORIDE: 103 mmol/L (ref 101–111)
CO2: 28 mmol/L (ref 22–32)
Calcium: 9.1 mg/dL (ref 8.9–10.3)
Creatinine, Ser: 1.03 mg/dL (ref 0.61–1.24)
GFR calc Af Amer: >60 mL/min (ref >60)
GFR calc non Af Amer: >60 mL/min (ref >60)
GLUCOSE: 101 mg/dL — AB (ref 65–99)
POTASSIUM: 4.5 mmol/L (ref 3.5–5.1)
Sodium: 139 mmol/L (ref 135–145)

## 2015-01-07 LAB — TROPONIN I
TROPONIN I: 0.03 ng/mL (ref <0.031)
TROPONIN I: 0.63 ng/mL — AB (ref <0.031)
TROPONIN I: 3.16 ng/mL — AB (ref <0.031)

## 2015-01-07 LAB — CBC
HEMATOCRIT: 46.1 % (ref 40.0–52.0)
HEMOGLOBIN: 15.3 g/dL (ref 13.0–18.0)
MCH: 30.4 pg (ref 26.0–34.0)
MCHC: 33.1 g/dL (ref 32.0–36.0)
MCV: 91.8 fL (ref 80.0–100.0)
PLATELETS: 264 10*3/uL (ref 150–440)
RBC: 5.02 MIL/uL (ref 4.40–5.90)
RDW: 12.8 % (ref 11.5–14.5)
WBC: 8.5 10*3/uL (ref 3.8–10.6)

## 2015-01-07 LAB — TSH: TSH: 7.329 u[IU]/mL — ABNORMAL HIGH (ref 0.350–4.500)

## 2015-01-07 LAB — APTT: aPTT: 30 seconds (ref 24–36)

## 2015-01-07 MED ORDER — ATORVASTATIN CALCIUM 20 MG PO TABS: 20.0000 mg | ORAL_TABLET | Freq: Every day | ORAL | Status: DC

## 2015-01-07 MED ORDER — ONDANSETRON HCL 4 MG PO TABS: 4.0000 mg | ORAL_TABLET | Freq: Four times a day (QID) | ORAL | Status: DC | PRN

## 2015-01-07 MED ORDER — ATORVASTATIN CALCIUM 20 MG PO TABS
20.0000 mg | ORAL_TABLET | Freq: Every day | ORAL | Status: DC
  Administered 2015-01-07 – 2015-01-08 (×2): 20 mg via ORAL
  Filled 2015-01-07 (×2): qty 1

## 2015-01-07 MED ORDER — DOCUSATE SODIUM 100 MG PO CAPS
100.0000 mg | ORAL_CAPSULE | Freq: Two times a day (BID) | ORAL | Status: DC
  Administered 2015-01-07 – 2015-01-11 (×7): 100 mg via ORAL
  Filled 2015-01-07 (×7): qty 1

## 2015-01-07 MED ORDER — GI COCKTAIL ~~LOC~~
ORAL | Status: AC
  Filled 2015-01-07: qty 30

## 2015-01-07 MED ORDER — NITROGLYCERIN 2 % TD OINT
0.5000 [in_us] | TOPICAL_OINTMENT | Freq: Four times a day (QID) | TRANSDERMAL | Status: DC
  Administered 2015-01-07 – 2015-01-10 (×2): 0.5 [in_us] via TOPICAL
  Filled 2015-01-07 (×2): qty 1

## 2015-01-07 MED ORDER — NITROGLYCERIN 2 % TD OINT
1.0000 [in_us] | TOPICAL_OINTMENT | Freq: Once | TRANSDERMAL | Status: AC
  Administered 2015-01-07: 1 [in_us] via TOPICAL
  Filled 2015-01-07: qty 1

## 2015-01-07 MED ORDER — HEPARIN BOLUS VIA INFUSION
4000.0000 [IU] | Freq: Once | INTRAVENOUS | Status: AC
  Administered 2015-01-07: 4000 [IU] via INTRAVENOUS
  Filled 2015-01-07: qty 4000

## 2015-01-07 MED ORDER — OXYCODONE HCL 5 MG PO TABS
5.0000 mg | ORAL_TABLET | Freq: Every day | ORAL | Status: DC | PRN
  Administered 2015-01-07 – 2015-01-10 (×2): 5 mg via ORAL
  Filled 2015-01-07 (×2): qty 1

## 2015-01-07 MED ORDER — MORPHINE SULFATE (PF) 4 MG/ML IV SOLN: 4.0000 mg | INTRAVENOUS | Status: DC | PRN

## 2015-01-07 MED ORDER — ONDANSETRON HCL 4 MG/2ML IJ SOLN: 4.0000 mg | Freq: Four times a day (QID) | INTRAMUSCULAR | Status: DC | PRN

## 2015-01-07 MED ORDER — ACETAMINOPHEN 325 MG PO TABS
650.0000 mg | ORAL_TABLET | Freq: Once | ORAL | Status: AC
  Administered 2015-01-07: 650 mg via ORAL
  Filled 2015-01-07: qty 2

## 2015-01-07 MED ORDER — ASPIRIN 81 MG PO CHEW
CHEWABLE_TABLET | ORAL | Status: AC
  Filled 2015-01-07: qty 4

## 2015-01-07 MED ORDER — GI COCKTAIL ~~LOC~~
30.0000 mL | Freq: Once | ORAL | Status: AC
  Administered 2015-01-07: 30 mL via ORAL

## 2015-01-07 MED ORDER — METOPROLOL TARTRATE 25 MG PO TABS
25.0000 mg | ORAL_TABLET | Freq: Four times a day (QID) | ORAL | Status: DC
  Administered 2015-01-07 – 2015-01-10 (×7): 25 mg via ORAL
  Filled 2015-01-07 (×9): qty 1

## 2015-01-07 MED ORDER — ASPIRIN 81 MG PO CHEW: 324.0000 mg | CHEWABLE_TABLET | Freq: Once | ORAL | Status: DC

## 2015-01-07 MED ORDER — DULOXETINE HCL 20 MG PO CPEP
20.0000 mg | ORAL_CAPSULE | Freq: Two times a day (BID) | ORAL | Status: DC
  Administered 2015-01-07 – 2015-01-11 (×7): 20 mg via ORAL
  Filled 2015-01-07 (×7): qty 1

## 2015-01-07 MED ORDER — LORATADINE 10 MG PO TABS
10.0000 mg | ORAL_TABLET | Freq: Every day | ORAL | Status: DC
  Administered 2015-01-07 – 2015-01-11 (×4): 10 mg via ORAL
  Filled 2015-01-07 (×4): qty 1

## 2015-01-07 MED ORDER — SODIUM CHLORIDE 0.9 % IJ SOLN
3.0000 mL | Freq: Two times a day (BID) | INTRAMUSCULAR | Status: DC
  Administered 2015-01-07 – 2015-01-09 (×4): 3 mL via INTRAVENOUS

## 2015-01-07 MED ORDER — ACETAMINOPHEN 650 MG RE SUPP: 650.0000 mg | Freq: Four times a day (QID) | RECTAL | Status: DC | PRN

## 2015-01-07 MED ORDER — ASPIRIN EC 325 MG PO TBEC
325.0000 mg | DELAYED_RELEASE_TABLET | Freq: Every day | ORAL | Status: DC
  Administered 2015-01-07 – 2015-01-09 (×3): 325 mg via ORAL
  Filled 2015-01-07 (×3): qty 1

## 2015-01-07 MED ORDER — GI COCKTAIL ~~LOC~~: 30.0000 mL | Freq: Once | ORAL | Status: DC

## 2015-01-07 MED ORDER — HEPARIN (PORCINE) IN NACL 100-0.45 UNIT/ML-% IJ SOLN
1300.0000 [IU]/h | INTRAMUSCULAR | Status: DC
  Administered 2015-01-07: 1300 [IU]/h via INTRAVENOUS
  Filled 2015-01-07 (×2): qty 250

## 2015-01-07 MED ORDER — ACETAMINOPHEN 325 MG PO TABS
650.0000 mg | ORAL_TABLET | Freq: Four times a day (QID) | ORAL | Status: DC | PRN
  Administered 2015-01-08: 650 mg via ORAL
  Filled 2015-01-07: qty 2

## 2015-01-07 NOTE — ED Notes (Signed)
Patient states improvement after taking GI cocktail.

## 2015-01-07 NOTE — H&P (Signed)
Robert Greer at [place] NAME: Robert Greer    MR#:  [id number]  DATE OF BIRTH:  Nov 21, 1955  DATE OF ADMISSION:  01/07/2015  PRIMARY CARE PHYSICIAN: Dicky Doe, MD   REQUESTING/REFERRING PHYSICIAN:   CHIEF COMPLAINT:   Chief Complaint  Patient presents with  . Chest Pain    HISTORY OF PRESENT ILLNESS: Robert Greer  is a 59 y.o. male with a known history of lipidemia, unspecified arrhythmia for which patient underwent cardiac workup approximately 40 years ago with Dr. Candis Musa who presents to the hospital with complaints of chest pain. Patient does believe he was driving at around 1 PM and started having tightness in the midsternal area radiating to his neck and jaws. Pain was very intense, 9-10 out of 10 by intensity and lasted for 45 minutes. Even now in emergency room , 7 hours later, he still has some discomfort in his chest. Pain was accompanied by nausea as well as dyspnea and the diaphoresis. Patient's EKG in the emergency room was unremarkable except of nonspecific ST-T abnormality. Patient's second troponin was found to be elevated at 0.63 and hospitalist services were contacted for admission of this patient with unstable angina. Of note, patient's chest pain subsided with GI cocktail.   PAST MEDICAL HISTORY:   Past Medical History  Diagnosis Date  . Hyperlipidemia   . Anxiety   . Mobitz (type) II atrioventricular block   . Arthritis   . Backache, unspecified   . Depressive disorder, not elsewhere classified   . Hypertension   . Mumps   . Measles     PAST SURGICAL HISTORY:  Past Surgical History  Procedure Laterality Date  . Back surgery  2012    x2  . Tonsillectomy and adenoidectomy    . Knee surgery      bilateral  . Anterior fusion lumbar spine    . Total shoulder replacement Right     SOCIAL HISTORY:  Social History  Substance Use Topics  . Smoking status: Never Smoker   . Smokeless tobacco: Current User     Types: Snuff  . Alcohol Use: 1.2 - 1.8 oz/week    2-3 Cans of beer per week     Comment: ocassionally    FAMILY HISTORY: No family history on file.  DRUG ALLERGIES: No Known Allergies  Review of Systems  Constitutional: Negative for fever, chills, weight loss and malaise/fatigue.  HENT: Negative for congestion.   Eyes: Negative for blurred vision and double vision.  Respiratory: Negative for cough, sputum production, shortness of breath and wheezing.   Cardiovascular: Positive for chest pain. Negative for palpitations, orthopnea, leg swelling and PND.  Gastrointestinal: Positive for nausea. Negative for vomiting, abdominal pain, diarrhea, constipation, blood in stool and melena.  Genitourinary: Negative for dysuria, urgency, frequency and hematuria.  Musculoskeletal: Negative for falls.  Skin: Negative for rash.  Neurological: Negative for dizziness and weakness.  Psychiatric/Behavioral: Negative for depression and memory loss. The patient is not nervous/anxious.     MEDICATIONS AT HOME:  Prior to Admission medications   Medication Sig Start Date End Date Taking? Authorizing Provider  atorvastatin (LIPITOR) 20 MG tablet Take 1 tablet (20 mg total) by mouth daily. 01/07/15   Minna Merritts, MD  cetirizine (ZYRTEC) 10 MG tablet Take 10 mg by mouth as needed.    Historical Provider, MD  DULoxetine (CYMBALTA) 20 MG capsule Take 20 mg by mouth 2 (two) times daily.  06/28/14   Historical  Provider, MD  meloxicam (MOBIC) 7.5 MG tablet Take 7.5 mg by mouth 2 (two) times daily.    Historical Provider, MD  Multiple Vitamin (MULTIVITAMIN) tablet Take 1 tablet by mouth daily.    Historical Provider, MD  oxyCODONE (OXY IR/ROXICODONE) 5 MG immediate release tablet Take 5 mg by mouth daily as needed.  10/30/11   Historical Provider, MD      PHYSICAL EXAMINATION:   VITAL SIGNS: Blood pressure 127/83, pulse 64, temperature 98.4 F (36.9 C), temperature source Oral, resp. rate 18, height 6' (1.829  m), weight 106.595 kg (235 lb), SpO2 98 %.  GENERAL:  59 y.o.-year-old patient lying in the bed with no acute distress.  EYES: Pupils equal, round, reactive to light and accommodation. No scleral icterus. Extraocular muscles intact.  HEENT: Head atraumatic, normocephalic. Oropharynx and nasopharynx clear.  NECK:  Supple, no jugular venous distention. No thyroid enlargement, no tenderness.  LUNGS: Normal breath sounds bilaterally, no wheezing, rales,rhonchi or crepitation. No use of accessory muscles of respiration.  CARDIOVASCULAR: S1, S2 normal. No murmurs, rubs, or gallops.  ABDOMEN: Soft, nontender, nondistended. Bowel sounds present. No organomegaly or mass.  EXTREMITIES: No pedal edema, cyanosis, or clubbing.  NEUROLOGIC: Cranial nerves II through XII are intact. Muscle strength 5/5 in all extremities. Sensation intact. Gait not checked.  PSYCHIATRIC: The patient is alert and oriented x 3.  SKIN: No obvious rash, lesion, or ulcer.   LABORATORY PANEL:   CBC  Recent Labs Lab 01/07/15 1454  WBC 8.5  HGB 15.3  HCT 46.1  PLT 264  MCV 91.8  MCH 30.4  MCHC 33.1  RDW 12.8   ------------------------------------------------------------------------------------------------------------------  Chemistries   Recent Labs Lab 01/07/15 1454  NA 139  K 4.5  CL 103  CO2 28  GLUCOSE 101*  BUN 17  CREATININE 1.03  CALCIUM 9.1   ------------------------------------------------------------------------------------------------------------------  Cardiac Enzymes  Recent Labs Lab 01/07/15 1454 01/07/15 1800  TROPONINI 0.03 0.63*   ------------------------------------------------------------------------------------------------------------------  RADIOLOGY: Dg Chest 2 View  01/07/2015  CLINICAL DATA:  59 year old male with chest pain radiating to the jaw and left upper extremity. Initial encounter. EXAM: CHEST  2 VIEW COMPARISON:  Chest radiographs 10/28/2003 FINDINGS: Lung  volumes remain within normal limits. Normal cardiac size and mediastinal contours. Visualized tracheal air column is within normal limits. No pneumothorax, pulmonary edema, pleural effusion or confluent pulmonary opacity. No acute osseous abnormality identified. Right shoulder arthroplasty is new since 2005. IMPRESSION: No acute cardiopulmonary abnormality. Electronically Signed   By: Genevie Ann M.D.   On: 01/07/2015 15:24    EKG: Orders placed or performed during the hospital encounter of 01/07/15  . EKG 12-Lead  . EKG 12-Lead  . ED EKG within 10 minutes  . ED EKG within 10 minutes    IMPRESSION AND PLAN:  Active Problems:   Unstable angina ([place]) 1. Unstable angina. Admit patient to medical floor. Initiate him on metoprolol, aspirin, nitroglycerin topically and the heparin intravenously checkwriting enzymes 3 days, cardiologist, Dr. Candis Musa to see patient in consultation, possible cardiac catheterization. 2. Elevated blood pressure without history of hypertension, subsided with chest pain management, follow closely and continue patient on metoprolol as well as nitroglycerin, may need to be on antihypertensives at home 3. Hyperlipidemia. Continue patient on the Lipitor. Check lipid panel 4. History of depression. Continue patient on Cymbalta   All the records are reviewed and case discussed with ED provider. Management plans discussed with the patient, family and they are in agreement.  CODE STATUS: Advance Directive  Documentation        Most Recent Value   Type of Advance Directive  Healthcare Power of Attorney, Living will   Pre-existing out of facility DNR order (yellow form or pink MOST form)     "MOST" Form in Place?         TOTAL TIME TAKING CARE OF THIS PATIENT: 55 minutes.    Theodoro Grist M.D on 01/07/2015 at 8:01 PM  Between 7am to 6pm - Pager - [phone] After 6pm go to [email or web address] - password [email or web address] [facility]  Office   [phone]  CC: Primary care physician; Dicky Doe, MD

## 2015-01-07 NOTE — ED Notes (Signed)
Patient transported to x-ray. ?

## 2015-01-07 NOTE — ED Provider Notes (Signed)
[facility] Emergency Department Provider Note   ____________________________________________  Time seen:  I have reviewed the triage vital signs and the triage nursing note.  HISTORY  Chief Complaint Chest Pain   Historian Patient, wife and daughter  HPI Robert Greer is a 59 y.o. male who is here for evaluation of an episode of substernal chest pain which was described as pressure and associated with nausea and sweating that occurred around 1:30 or so. He checked his blood pressure at that point in time and his blood pressure was about 160/100. He does not have a history of hypertension. He is not a smoker. No family history of coronary artery disease. He has had a stress test probably several years ago. He is followed with Dr. Candis Musa in the past due to irregular heartbeat, but was cleared from that - was not reportedly afib, was "2 beats."  He's had a history of GERD and had been on Nexium but this was about 10 years ago. His initial impression was that this was an episode of GERD.  Symptoms were severe at first, and currently are mild. He does not typically take aspirin.      Past Medical History  Diagnosis Date  . Hyperlipidemia   . Anxiety   . Mobitz (type) II atrioventricular block   . Arthritis   . Backache, unspecified   . Depressive disorder, not elsewhere classified   . Hypertension   . Mumps   . Measles     Patient Active Problem List   Diagnosis Date Noted  . Low back pain 06/30/2014  . Squamous cell carcinoma of nose ([place]) 06/17/2013  . Arrhythmia 08/20/2011  . Hyperlipidemia 08/20/2011    Past Surgical History  Procedure Laterality Date  . Back surgery  2012    x2  . Tonsillectomy and adenoidectomy    . Knee surgery      bilateral  . Anterior fusion lumbar spine    . Total shoulder replacement Right     Current Outpatient Rx  Name  Route  Sig  Dispense  Refill  . atorvastatin (LIPITOR) 20 MG tablet   Oral   Take 1 tablet  (20 mg total) by mouth daily.   90 tablet   3   . cetirizine (ZYRTEC) 10 MG tablet   Oral   Take 10 mg by mouth as needed.         . DULoxetine (CYMBALTA) 20 MG capsule   Oral   Take 20 mg by mouth 2 (two) times daily.       0   . meloxicam (MOBIC) 7.5 MG tablet   Oral   Take 7.5 mg by mouth 2 (two) times daily.         . Multiple Vitamin (MULTIVITAMIN) tablet   Oral   Take 1 tablet by mouth daily.         Marland Kitchen oxyCODONE (OXY IR/ROXICODONE) 5 MG immediate release tablet   Oral   Take 5 mg by mouth daily as needed.            Allergies Review of patient's allergies indicates no known allergies.  No family history on file.  Social History Social History  Substance Use Topics  . Smoking status: Never Smoker   . Smokeless tobacco: Current User    Types: Snuff  . Alcohol Use: 1.2 - 1.8 oz/week    2-3 Cans of beer per week     Comment: ocassionally    Review of Systems  Constitutional: Negative for fever. Eyes: Negative for visual changes. ENT: Negative for sore throat. Cardiovascular: Positive for chest pain. Respiratory: Negative for shortness of breath. Gastrointestinal: Negative for abdominal pain, vomiting and diarrhea. Genitourinary: Negative for dysuria. Musculoskeletal: Negative for back pain. Skin: Negative for rash. Neurological: Negative for headache. 10 point Review of Systems otherwise negative ____________________________________________   PHYSICAL EXAM:  VITAL SIGNS: ED Triage Vitals  Enc Vitals Group     BP 01/07/15 1453 148/88 mmHg     Pulse Rate 01/07/15 1453 73     Resp 01/07/15 1453 18     Temp 01/07/15 1453 98.4 F (36.9 C)     Temp Source 01/07/15 1453 Oral     SpO2 01/07/15 1453 96 %     Weight 01/07/15 1450 235 lb (106.595 kg)     Height 01/07/15 1450 6' (1.829 m)     Head Cir --      Peak Flow --      Pain Score 01/07/15 1451 8     Pain Loc --      Pain Edu? --      Excl. in [place]? --      Constitutional: Alert  and oriented. Well appearing and in no distress. Eyes: Conjunctivae are normal. PERRL. Normal extraocular movements. ENT   Head: Normocephalic and atraumatic.   Nose: No congestion/rhinnorhea.   Mouth/Throat: Mucous membranes are moist.   Neck: No stridor. Cardiovascular/Chest: Normal rate, regular rhythm.  No murmurs, rubs, or gallops. Respiratory: Normal respiratory effort without tachypnea nor retractions. Breath sounds are clear and equal bilaterally. No wheezes/rales/rhonchi. Gastrointestinal: Soft. No distention, no guarding, no rebound. Nontender   Genitourinary/rectal:Deferred Musculoskeletal: Nontender with normal range of motion in all extremities. No joint effusions.  No lower extremity tenderness.  No edema. Neurologic:  Normal speech and language. No gross or focal neurologic deficits are appreciated. Skin:  Skin is warm, dry and intact. No rash noted. Psychiatric: Mood and affect are normal. Speech and behavior are normal. Patient exhibits appropriate insight and judgment.  ____________________________________________   EKG I, Lisa Roca, MD, the attending physician have personally viewed and interpreted all ECGs.  72 bpm. Normal sinus rhythm. Narrow QRS. Normal axis. Nonspecific ST/T-wave ____________________________________________  LABS (pertinent positives/negatives)  Basic metabolic panel, CBC are within normal limits Troponin 0.03 Repeat troponin 0.63  ____________________________________________  RADIOLOGY All Xrays were viewed by me. Imaging interpreted by Radiologist.  Chest x-ray two-view: No acute cardio pulmonary abnormality. __________________________________________  PROCEDURES  Procedure(s) performed: None  Critical Care performed: None  ____________________________________________   ED COURSE / ASSESSMENT AND PLAN  CONSULTATIONS: I spoke with Dr. Fletcher Anon, [facility] cardiology - set up for appointment, on Monday after initial  troponin negative in preparation for possible discharge. However once troponin repeat came back elevated, patient was admitted to the hospitalist.      Pertinent labs & imaging results that were available during my care of the patient were reviewed by me and considered in my medical decision making (see chart for details).     Patient's symptoms sound like they could be related to an episode of indigestion, however could also be related to cardiac cause based on description of symptoms. His initial EKG is without signs of acute ischemia. His initial laboratory evaluation is negative including troponin 0.03. He has risk factor for coronary artery disease of hyperlipidemia, and I will refer him for outpatient follow-up for likely stress testing. He received aspirin by fire department prior to arrival to the ED.  Patient  was treated in the emergency department with a GI cocktail. This gave him good relief. He is just complaining of feeling worn out.  I repeated a troponin, and this came back elevated 0.63. I discussed this with the patient and start him on heparin given the symptoms could have been related to acute coronary syndrome with now the elevated troponin, he'll be treated with a blood thinner until repeat troponin. I discussed the case with the hospitalist for admission.    Patient / Family / Caregiver informed of clinical course, medical decision-making process, and agree with plan.    ___________________________________________   FINAL CLINICAL IMPRESSION(S) / ED DIAGNOSES   Final diagnoses:  Precordial pain  Troponin I above reference range       Lisa Roca, MD 01/07/15 1929

## 2015-01-07 NOTE — Progress Notes (Signed)
Patient arrived to 2A Room 238. Patient denies pain and all questions answered. Patient oriented to unit and Fall Safety Plan signed. Skin assessment completed with Harriette Bouillon. Skin intact. Nursing staff will continue to monitor. Earleen Reaper, RN

## 2015-01-07 NOTE — ED Notes (Signed)
Pt states he had taken lunch to his wife today, states he broke out in a sweat, pain began midsternal chest, throat, and jaw. Pt called fire dept, EKG was "good" according to them, pt states his bp was elevated and his sugar was high. Pt anxious in triage, still having neck and jaw pain now. Pt asking for GI cocktail.

## 2015-01-07 NOTE — Progress Notes (Signed)
ANTICOAGULATION CONSULT NOTE - Initial Consult  Pharmacy Consult for Heparin  Indication: chest pain/ACS  No Known Allergies  Patient Measurements: Height: 6' (182.9 cm) Weight: 235 lb (106.595 kg) IBW/kg (Calculated) : 77.6 Heparin Dosing Weight: 99.9 kg  Vital Signs: Temp: 98.4 F (36.9 C) (12/16 1453) Temp Source: Oral (12/16 1453) BP: 127/83 mmHg (12/16 1930) Pulse Rate: 64 (12/16 1930)  Labs:  Recent Labs  01/07/15 1454 01/07/15 1800  HGB 15.3  --   HCT 46.1  --   PLT 264  --   APTT 30  --   CREATININE 1.03  --   TROPONINI 0.03 0.63*    Estimated Creatinine Clearance: 95 mL/min (by C-G formula based on Cr of 1.03).   Medical History: Past Medical History  Diagnosis Date  . Hyperlipidemia   . Anxiety   . Mobitz (type) II atrioventricular block   . Arthritis   . Backache, unspecified   . Depressive disorder, not elsewhere classified   . Hypertension   . Mumps   . Measles     Medications:   (Not in a hospital admission)  Assessment: CrCl = 95 ml/min Goal of Therapy:  Heparin level 0.3-0.7 units/ml Monitor platelets by anticoagulation protocol: Yes   Plan:  Give 4000 units bolus x 1 Start heparin infusion at 1300 units/hr  Will draw 1st HL 6 hrs after start of drip on 12/17 @ 1:30. Will check CBC daily.   Robert Greer D 01/07/2015,7:49 PM

## 2015-01-07 NOTE — ED Notes (Signed)
Second troponin collected and sent to lab.

## 2015-01-08 ENCOUNTER — Inpatient Hospital Stay (HOSPITAL_COMMUNITY)
Admit: 2015-01-08 | Discharge: 2015-01-08 | Disposition: A | Discharge status: Home / Self Care | Payer: Medicare Other | Attending: Cardiology | Admitting: Cardiology

## 2015-01-08 DIAGNOSIS — I214 Non-ST elevation (NSTEMI) myocardial infarction: Principal | ICD-10-CM

## 2015-01-08 DIAGNOSIS — R079 Chest pain, unspecified: Secondary | ICD-10-CM

## 2015-01-08 LAB — CBC
HEMATOCRIT: 43 % (ref 40.0–52.0)
Hemoglobin: 14.2 g/dL (ref 13.0–18.0)
MCH: 30.4 pg (ref 26.0–34.0)
MCHC: 32.9 g/dL (ref 32.0–36.0)
MCV: 92.4 fL (ref 80.0–100.0)
Platelets: 224 10*3/uL (ref 150–440)
RBC: 4.66 MIL/uL (ref 4.40–5.90)
RDW: 12.8 % (ref 11.5–14.5)
WBC: 8.8 10*3/uL (ref 3.8–10.6)

## 2015-01-08 LAB — HEPARIN LEVEL (UNFRACTIONATED)
HEPARIN UNFRACTIONATED: 0.33 [IU]/mL (ref 0.30–0.70)
HEPARIN UNFRACTIONATED: 0.14 [IU]/mL — AB (ref 0.30–0.70)
HEPARIN UNFRACTIONATED: 0.65 [IU]/mL (ref 0.30–0.70)
Heparin Unfractionated: 0.2 IU/mL — ABNORMAL LOW (ref 0.30–0.70)

## 2015-01-08 LAB — TROPONIN I
TROPONIN I: 9.69 ng/mL — AB (ref <0.031)
Troponin I: 10.08 ng/mL — ABNORMAL HIGH (ref <0.031)
Troponin I: 9.73 ng/mL — ABNORMAL HIGH (ref <0.031)
Troponin I: 9.58 ng/mL — ABNORMAL HIGH (ref <0.031)

## 2015-01-08 LAB — LIPID PANEL
CHOLESTEROL: 149 mg/dL (ref 0–200)
HDL: 48 mg/dL (ref >40)
LDL CALC: 78 mg/dL (ref 0–99)
TRIGLYCERIDES: 114 mg/dL (ref <150)
Total CHOL/HDL Ratio: 3.1 RATIO
VLDL: 23 mg/dL (ref 0–40)

## 2015-01-08 LAB — PROTIME-INR
INR: 1.01
Prothrombin Time: 13.5 seconds (ref 11.4–15.0)

## 2015-01-08 LAB — HEMOGLOBIN A1C: HEMOGLOBIN A1C: 5.5 % (ref 4.0–6.0)

## 2015-01-08 LAB — BASIC METABOLIC PANEL
ANION GAP: 7 (ref 5–15)
BUN: 17 mg/dL (ref 6–20)
CHLORIDE: 109 mmol/L (ref 101–111)
CO2: 24 mmol/L (ref 22–32)
Calcium: 8.5 mg/dL — ABNORMAL LOW (ref 8.9–10.3)
Creatinine, Ser: 1 mg/dL (ref 0.61–1.24)
Glucose, Bld: 102 mg/dL — ABNORMAL HIGH (ref 65–99)
POTASSIUM: 4.4 mmol/L (ref 3.5–5.1)
SODIUM: 140 mmol/L (ref 135–145)

## 2015-01-08 MED ORDER — CLOPIDOGREL BISULFATE 75 MG PO TABS
300.0000 mg | ORAL_TABLET | Freq: Once | ORAL | Status: AC
  Administered 2015-01-08: 300 mg via ORAL
  Filled 2015-01-08: qty 4

## 2015-01-08 MED ORDER — ATORVASTATIN CALCIUM 20 MG PO TABS
80.0000 mg | ORAL_TABLET | Freq: Every day | ORAL | Status: DC
  Administered 2015-01-09 – 2015-01-11 (×2): 80 mg via ORAL
  Filled 2015-01-08 (×2): qty 4

## 2015-01-08 MED ORDER — HEPARIN BOLUS VIA INFUSION
1500.0000 [IU] | Freq: Once | INTRAVENOUS | Status: AC
  Administered 2015-01-08: 1500 [IU] via INTRAVENOUS
  Filled 2015-01-08: qty 1500

## 2015-01-08 MED ORDER — CLOPIDOGREL BISULFATE 75 MG PO TABS
75.0000 mg | ORAL_TABLET | Freq: Every day | ORAL | Status: DC
  Administered 2015-01-09: 75 mg via ORAL
  Filled 2015-01-08: qty 1

## 2015-01-08 MED ORDER — HEPARIN (PORCINE) IN NACL 100-0.45 UNIT/ML-% IJ SOLN
1500.0000 [IU]/h | INTRAMUSCULAR | Status: DC
  Administered 2015-01-08: 1500 [IU]/h via INTRAVENOUS
  Filled 2015-01-08 (×3): qty 250

## 2015-01-08 MED ORDER — IBUPROFEN 200 MG PO TABS: 400.0000 mg | ORAL_TABLET | Freq: Four times a day (QID) | ORAL | Status: DC | PRN

## 2015-01-08 MED ORDER — HEPARIN (PORCINE) IN NACL 100-0.45 UNIT/ML-% IJ SOLN
1800.0000 [IU]/h | INTRAMUSCULAR | Status: DC
  Administered 2015-01-08 – 2015-01-10 (×3): 1800 [IU]/h via INTRAVENOUS
  Filled 2015-01-08 (×7): qty 250

## 2015-01-08 MED ORDER — HEPARIN BOLUS VIA INFUSION
3000.0000 [IU] | Freq: Once | INTRAVENOUS | Status: AC
  Administered 2015-01-08: 3000 [IU] via INTRAVENOUS
  Filled 2015-01-08: qty 3000

## 2015-01-08 NOTE — Consult Note (Addendum)
CARDIOLOGY CONSULT NOTE     Primary Care Physician: Dicky Doe, MD Referring Physician:  Admit Date: 01/07/2015  Reason for consultation:  Robert Greer is a 59 y.o. male with a h/o hyperlipidemia who presents to [facility] complaining of chest pain. The chest pain started yesterday as he was driving home. He states that the pain is in the middle of his chest and radiated to his neck and was a pressure type pain. He also had heartburn-type feelings and sweating. He returned home and his son called the rescue squad. Per the patient the rescue squad noted no changes on his EKG but suggested that he go to the hospital. His son drove him to the hospital and on arrival his chest pain subsided with the GI cocktail. His troponins were noted to be elevated and he was started on IV heparin and admitted to the hospital. Currently he feels well without any chest pain.  Today, he denies symptoms of palpitations, chest pain, shortness of breath, orthopnea, PND, lower extremity edema, dizziness, presyncope, syncope, or neurologic sequela. The patient is tolerating medications without difficulties and is otherwise without complaint today.   Past Medical History  Diagnosis Date  . Hyperlipidemia   . Anxiety   . Mobitz (type) II atrioventricular block   . Arthritis   . Backache, unspecified   . Depressive disorder, not elsewhere classified   . Hypertension   . Mumps   . Measles    Past Surgical History  Procedure Laterality Date  . Back surgery  2012    x2  . Tonsillectomy and adenoidectomy    . Knee surgery      bilateral  . Anterior fusion lumbar spine    . Total shoulder replacement Right     . aspirin EC  325 mg Oral Daily  . atorvastatin  20 mg Oral Daily  . docusate sodium  100 mg Oral BID  . DULoxetine  20 mg Oral BID  . loratadine  10 mg Oral Daily  . metoprolol tartrate  25 mg Oral Q6H  . nitroGLYCERIN  0.5 inch Topical 4 times per [place]  . sodium chloride  3 mL  Intravenous Q12H   . heparin 1,300 Units/hr (01/07/15 1925)    No Known Allergies  Social History   Social History  . Marital Status: Married    Spouse Name: N/A  . Number of Children: N/A  . Years of Education: N/A   Occupational History  . Not on file.   Social History Main Topics  . Smoking status: Never Smoker   . Smokeless tobacco: Current User    Types: Snuff  . Alcohol Use: 1.2 - 1.8 oz/week    2-3 Cans of beer per week     Comment: ocassionally  . Drug Use: No  . Sexual Activity: Not on file   Other Topics Concern  . Not on file   Social History Narrative    No family history on file.  ROS- All systems are reviewed and negative except as per the HPI above  Physical Exam: Telemetry: Filed Vitals:   01/07/15 2051 01/08/15 0138 01/08/15 0526 01/08/15 0805  BP: 139/89  110/71 123/74  Pulse: 68 60 59 63  Temp: 98.1 F (36.7 C)  98 F (36.7 C)   TempSrc: Oral  Oral   Resp: 18  22   Height:      Weight:      SpO2: 96%  99%     GEN- The patient  is well appearing, alert and oriented x 3 today.   Head- normocephalic, atraumatic Eyes-  Sclera clear, conjunctiva pink Ears- hearing intact Oropharynx- clear Neck- supple, no JVP Lymph- no cervical lymphadenopathy Lungs- Clear to ausculation bilaterally, normal work of breathing Heart- Regular rate and rhythm, no murmurs, rubs or gallops, PMI not laterally displaced GI- soft, NT, ND, + BS Extremities- no clubbing, cyanosis, or edema MS- no significant deformity or atrophy Skin- no rash or lesion Psych- euthymic mood, full affect Neuro- strength and sensation are intact  EKG: sinus rhythm, < 50mm coved ST elevation in II, no other ST-T wave changes  Labs:   Lab Results  Component Value Date   WBC 8.8 01/08/2015   HGB 14.2 01/08/2015   HCT 43.0 01/08/2015   MCV 92.4 01/08/2015   PLT 224 01/08/2015    Recent Labs Lab 01/08/15 0225  NA 140  K 4.4  CL 109  CO2 24  BUN 17  CREATININE 1.00    CALCIUM 8.5*  GLUCOSE 102*   Lab Results  Component Value Date   TROPONINI 10.08* 01/08/2015    Lab Results  Component Value Date   CHOL 149 01/08/2015   Lab Results  Component Value Date   HDL 48 01/08/2015   Lab Results  Component Value Date   LDLCALC 78 01/08/2015   Lab Results  Component Value Date   TRIG 114 01/08/2015   Lab Results  Component Value Date   CHOLHDL 3.1 01/08/2015   No results found for: LDLDIRECT    Radiology:CXR No acute cardiopulmonary abnormality.  Echo: pending  ASSESSMENT AND PLAN:   1. NSTEMI: With his history of chest pain and elevated troponins and without acute changes on his EKG he is currently having an an NSTEMI. He more Troponins are currently at 10 and had not yet peaked. We'll continue him on IV heparin and start 300 mg of Plavix followed by 75 mg per [place]. We will set him up to have a cardiac cath with possible intervention on Monday.  Will order a TTE today to assess LV function.  2. Hyperlipidemia: Will increase his atorvastatin to 80 mg.    Will Meredith Leeds, MD 01/08/2015  10:11 AM

## 2015-01-08 NOTE — Progress Notes (Signed)
[facility] at [place] NAME: Beniamin Mcknight    MR#:  [id number]  DATE OF BIRTH:  [birth year]  SUBJECTIVE:  CHIEF COMPLAINT:   Chief Complaint  Patient presents with  . Chest Pain   Continues to have slight substernal chest pain. No shortness of breath.  REVIEW OF SYSTEMS:   Review of Systems  Constitutional: Negative for fever.  Respiratory: Negative for shortness of breath.   Cardiovascular: Positive for chest pain. Negative for palpitations.  Gastrointestinal: Negative for nausea, vomiting and abdominal pain.  Genitourinary: Negative for dysuria.   DRUG ALLERGIES:  No Known Allergies  VITALS:  Blood pressure 120/75, pulse 67, temperature 98 F (36.7 C), temperature source Oral, resp. rate 18, height 6' (1.829 m), weight 106.595 kg (235 lb), SpO2 95 %.  PHYSICAL EXAMINATION:  GENERAL:  59 y.o.-year-old patient lying in the bed with no acute distress.  LUNGS: Normal breath sounds bilaterally, no wheezing, rales,rhonchi or crepitation. No use of accessory muscles of respiration.  CARDIOVASCULAR: S1, S2 normal. No murmurs, rubs, or gallops.  ABDOMEN: Soft, nontender, nondistended. Bowel sounds present. No organomegaly or mass.  EXTREMITIES: No pedal edema, cyanosis, or clubbing.  NEUROLOGIC: Cranial nerves II through XII are intact. Muscle strength 5/5 in all extremities. Sensation intact. Gait not checked.  PSYCHIATRIC: The patient is alert and oriented x 3.  SKIN: No obvious rash, lesion, or ulcer.   LABORATORY PANEL:   CBC  Recent Labs Lab 01/08/15 0225  WBC 8.8  HGB 14.2  HCT 43.0  PLT 224   ------------------------------------------------------------------------------------------------------------------  Chemistries   Recent Labs Lab 01/08/15 0225  NA 140  K 4.4  CL 109  CO2 24  GLUCOSE 102*  BUN 17  CREATININE 1.00  CALCIUM 8.5*    ------------------------------------------------------------------------------------------------------------------  Cardiac Enzymes  Recent Labs Lab 01/08/15 0924  TROPONINI 9.73*   ------------------------------------------------------------------------------------------------------------------  RADIOLOGY:  Dg Chest 2 View  01/07/2015  CLINICAL DATA:  59 year old male with chest pain radiating to the jaw and left upper extremity. Initial encounter. EXAM: CHEST  2 VIEW COMPARISON:  Chest radiographs 10/28/2003 FINDINGS: Lung volumes remain within normal limits. Normal cardiac size and mediastinal contours. Visualized tracheal air column is within normal limits. No pneumothorax, pulmonary edema, pleural effusion or confluent pulmonary opacity. No acute osseous abnormality identified. Right shoulder arthroplasty is new since 2005. IMPRESSION: No acute cardiopulmonary abnormality. Electronically Signed   By: Genevie Ann M.D.   On: 01/07/2015 15:24    EKG:   Orders placed or performed during the hospital encounter of 01/07/15  . EKG 12-Lead  . EKG 12-Lead  . ED EKG within 10 minutes  . ED EKG within 10 minutes    ASSESSMENT AND PLAN:   1. NSTEMI - Pain controlled - Appreciate cardiology consultation - Continue IV heparin, Plavix started, cardiac catheterization planned for Monday - Echo ordered - Continue to cycle cardiac enzymes  2. Hyperlipidemia: - Increase to high put into a statin  3. Hypertension - Controlled. Continue metoprolol  4. Depression - Continue Cymbalta  All the records are reviewed and case discussed with Care Management/Social Workerr. Management plans discussed with the patient, family and they are in agreement.  CODE STATUS: NSTEMI  TOTAL TIME TAKING CARE OF THIS PATIENT: 30 minutes.  Greater than 50% of time spent in care coordination and counseling. POSSIBLE D/C IN 4 DAYS, DEPENDING ON CLINICAL CONDITION.   Myrtis Ser M.D on 01/08/2015 at  2:14 PM  Between 7am to 6pm -  Pager - [phone]  After 6pm go to [email or web address] - password [email or web address] [facility]  Office  [phone]  CC: Primary care physician; Dicky Doe, MD

## 2015-01-08 NOTE — Progress Notes (Signed)
ANTICOAGULATION CONSULT NOTE -Follow up  Pharmacy Consult for Heparin  Indication: chest pain/ACS  No Known Allergies  Patient Measurements: Height: 6' (182.9 cm) Weight: 235 lb (106.595 kg) IBW/kg (Calculated) : 77.6 Heparin Dosing Weight: 99.9 kg  Vital Signs: Temp: 98 F (36.7 C) (12/17 1103) Temp Source: Oral (12/17 1103) BP: 120/75 mmHg (12/17 1346) Pulse Rate: 67 (12/17 1346)  Labs:  Recent Labs  01/07/15 1454  01/08/15 0225 01/08/15 0924 01/08/15 0944 01/08/15 1437 01/08/15 1438  HGB 15.3  --  14.2  --   --   --   --   HCT 46.1  --  43.0  --   --   --   --   PLT 264  --  224  --   --   --   --   APTT 30  --   --   --   --   --   --   LABPROT  --   --   --   --  13.5  --   --   INR  --   --   --   --  1.01  --   --   HEPARINUNFRC  --   --  0.33  --  0.20*  --  0.14*  CREATININE 1.03  --  1.00  --   --   --   --   TROPONINI 0.03  < > 10.08* 9.73*  --  9.58*  --   < > = values in this interval not displayed.  Estimated Creatinine Clearance: 97.9 mL/min (by C-G formula based on Cr of 1).   Medical History: Past Medical History  Diagnosis Date  . Hyperlipidemia   . Anxiety   . Mobitz (type) II atrioventricular block   . Arthritis   . Backache, unspecified   . Depressive disorder, not elsewhere classified   . Hypertension   . Mumps   . Measles     Medications:  Prescriptions prior to admission  Medication Sig Dispense Refill Last Dose  . atorvastatin (LIPITOR) 20 MG tablet Take 1 tablet (20 mg total) by mouth daily. 90 tablet 3 01/06/2015 at Unknown time  . cetirizine (ZYRTEC) 10 MG tablet Take 10 mg by mouth as needed for allergies.    PRN at PRN  . DULoxetine (CYMBALTA) 20 MG capsule Take 40 mg by mouth daily.   0 01/07/2015 at Unknown time  . meloxicam (MOBIC) 7.5 MG tablet Take 15 mg by mouth daily.    01/07/2015 at 0630  . Multiple Vitamin (MULTIVITAMIN WITH MINERALS) TABS tablet Take 1 tablet by mouth daily.   01/07/2015 at Unknown time  .  oxyCODONE-acetaminophen (PERCOCET) 10-325 MG tablet Take 1 tablet by mouth every 4 (four) hours as needed for pain.   01/07/2015 at 0630    Assessment: CrCl = 97.9 ml/min   Goal of Therapy:  Heparin level 0.3-0.7 units/ml Monitor platelets by anticoagulation protocol: Yes   Plan:  Give 4000 units bolus x 1 Start heparin infusion at 1300 units/hr  Will draw 1st HL 6 hrs after start of drip on 12/17 @ 1:30. Will check CBC daily.   12/17 02:30 heparin level 0.33.  Recheck in 6 hours to confirm. Next level 12/17 @ 0800.  12/17 Heparin level at 0944= 0.20. Will give Heparin bolus of 1500 units x 1 and increase Heparin Drip to 1500 units/hr. Next level in 6 hours today at 1600.  12/17 Heparin level at 1630= 0.14. Give Heparin bolus of  3000 units x 1 and increase Heparin drip to 1800 units/hr. Next Heparin level in 6 hrs at 2230.  Chinita Greenland PharmD Clinical Pharmacist 01/08/2015 ,5:34 PM

## 2015-01-08 NOTE — Progress Notes (Signed)
Patient requested Mobic for back pain. Pt stated he takes it at home. Notified Dr. Volanda Napoleon. Per MD okay to order ibuprofen 400 mg p6h PRN. Will continue to monitor. Horton Finer

## 2015-01-08 NOTE — Progress Notes (Signed)
ANTICOAGULATION CONSULT NOTE -Follow up  Pharmacy Consult for Heparin  Indication: chest pain/ACS  No Known Allergies  Patient Measurements: Height: 6' (182.9 cm) Weight: 235 lb (106.595 kg) IBW/kg (Calculated) : 77.6 Heparin Dosing Weight: 99.9 kg  Vital Signs: Temp: 98 F (36.7 C) (12/17 0526) Temp Source: Oral (12/17 0526) BP: 123/74 mmHg (12/17 0805) Pulse Rate: 63 (12/17 0805)  Labs:  Recent Labs  01/07/15 1454  01/07/15 2054 01/08/15 0225 01/08/15 0924 01/08/15 0944  HGB 15.3  --   --  14.2  --   --   HCT 46.1  --   --  43.0  --   --   PLT 264  --   --  224  --   --   APTT 30  --   --   --   --   --   LABPROT  --   --   --   --   --  13.5  INR  --   --   --   --   --  1.01  HEPARINUNFRC  --   --   --  0.33  --  0.20*  CREATININE 1.03  --   --  1.00  --   --   TROPONINI 0.03  < > 3.16* 10.08* 9.73*  --   < > = values in this interval not displayed.  Estimated Creatinine Clearance: 97.9 mL/min (by C-G formula based on Cr of 1).   Medical History: Past Medical History  Diagnosis Date  . Hyperlipidemia   . Anxiety   . Mobitz (type) II atrioventricular block   . Arthritis   . Backache, unspecified   . Depressive disorder, not elsewhere classified   . Hypertension   . Mumps   . Measles     Medications:  Prescriptions prior to admission  Medication Sig Dispense Refill Last Dose  . atorvastatin (LIPITOR) 20 MG tablet Take 1 tablet (20 mg total) by mouth daily. 90 tablet 3 01/06/2015 at Unknown time  . cetirizine (ZYRTEC) 10 MG tablet Take 10 mg by mouth as needed for allergies.    PRN at PRN  . DULoxetine (CYMBALTA) 20 MG capsule Take 40 mg by mouth daily.   0 01/07/2015 at Unknown time  . meloxicam (MOBIC) 7.5 MG tablet Take 15 mg by mouth daily.    01/07/2015 at 0630  . Multiple Vitamin (MULTIVITAMIN WITH MINERALS) TABS tablet Take 1 tablet by mouth daily.   01/07/2015 at Unknown time  . oxyCODONE-acetaminophen (PERCOCET) 10-325 MG tablet Take 1 tablet  by mouth every 4 (four) hours as needed for pain.   01/07/2015 at 0630    Assessment: CrCl = 97.9 ml/min   Goal of Therapy:  Heparin level 0.3-0.7 units/ml Monitor platelets by anticoagulation protocol: Yes   Plan:  Give 4000 units bolus x 1 Start heparin infusion at 1300 units/hr  Will draw 1st HL 6 hrs after start of drip on 12/17 @ 1:30. Will check CBC daily.   12/17 02:30 heparin level 0.33.  Recheck in 6 hours to confirm. Next level 12/17 @ 0800.  12/17 Heparin level at 0944= 0.20. Will give Heparin bolus of 1500 units x 1 and increase Heparin Drip to 1500 units/hr. Next level in 6 hours today at 1600.  Chinita Greenland PharmD Clinical Pharmacist 01/08/2015 ,10:50 AM

## 2015-01-08 NOTE — Progress Notes (Signed)
ANTICOAGULATION CONSULT NOTE - Initial Consult  Pharmacy Consult for Heparin  Indication: chest pain/ACS  No Known Allergies  Patient Measurements: Height: 6' (182.9 cm) Weight: 235 lb (106.595 kg) IBW/kg (Calculated) : 77.6 Heparin Dosing Weight: 99.9 kg  Vital Signs: Temp: 98.1 F (36.7 C) (12/16 2051) Temp Source: Oral (12/16 2051) BP: 139/89 mmHg (12/16 2051) Pulse Rate: 60 (12/17 0138)  Labs:  Recent Labs  01/07/15 1454 01/07/15 1800 01/07/15 2054 01/08/15 0225  HGB 15.3  --   --  14.2  HCT 46.1  --   --  43.0  PLT 264  --   --  224  APTT 30  --   --   --   HEPARINUNFRC  --   --   --  0.33  CREATININE 1.03  --   --   --   TROPONINI 0.03 0.63* 3.16*  --     Estimated Creatinine Clearance: 95 mL/min (by C-G formula based on Cr of 1.03).   Medical History: Past Medical History  Diagnosis Date  . Hyperlipidemia   . Anxiety   . Mobitz (type) II atrioventricular block   . Arthritis   . Backache, unspecified   . Depressive disorder, not elsewhere classified   . Hypertension   . Mumps   . Measles     Medications:  Prescriptions prior to admission  Medication Sig Dispense Refill Last Dose  . atorvastatin (LIPITOR) 20 MG tablet Take 1 tablet (20 mg total) by mouth daily. 90 tablet 3 01/06/2015 at Unknown time  . cetirizine (ZYRTEC) 10 MG tablet Take 10 mg by mouth as needed for allergies.    PRN at PRN  . DULoxetine (CYMBALTA) 20 MG capsule Take 40 mg by mouth daily.   0 01/07/2015 at Unknown time  . meloxicam (MOBIC) 7.5 MG tablet Take 15 mg by mouth daily.    01/07/2015 at 0630  . Multiple Vitamin (MULTIVITAMIN WITH MINERALS) TABS tablet Take 1 tablet by mouth daily.   01/07/2015 at Unknown time  . oxyCODONE-acetaminophen (PERCOCET) 10-325 MG tablet Take 1 tablet by mouth every 4 (four) hours as needed for pain.   01/07/2015 at 0630    Assessment: CrCl = 95 ml/min Goal of Therapy:  Heparin level 0.3-0.7 units/ml Monitor platelets by anticoagulation  protocol: Yes   Plan:  Give 4000 units bolus x 1 Start heparin infusion at 1300 units/hr  Will draw 1st HL 6 hrs after start of drip on 12/17 @ 1:30. Will check CBC daily.   12/17 02:30 heparin level 0.33.  Recheck in 6 hours to confirm. Next level 12/17 @ 0800.  Aiden Helzer S 01/08/2015,2:53 AM

## 2015-01-09 LAB — CBC
HCT: 45.4 % (ref 40.0–52.0)
Hemoglobin: 15.2 g/dL (ref 13.0–18.0)
MCH: 30.8 pg (ref 26.0–34.0)
MCHC: 33.5 g/dL (ref 32.0–36.0)
MCV: 92 fL (ref 80.0–100.0)
PLATELETS: 231 10*3/uL (ref 150–440)
RBC: 4.93 MIL/uL (ref 4.40–5.90)
RDW: 12.9 % (ref 11.5–14.5)
WBC: 13.2 10*3/uL — AB (ref 3.8–10.6)

## 2015-01-09 LAB — TROPONIN I: TROPONIN I: 9.24 ng/mL — AB (ref <0.031)

## 2015-01-09 LAB — HEPARIN LEVEL (UNFRACTIONATED): Heparin Unfractionated: 0.48 IU/mL (ref 0.30–0.70)

## 2015-01-09 MED ORDER — SODIUM CHLORIDE 0.9 % WEIGHT BASED INFUSION: 1.0000 mL/kg/h | INTRAVENOUS | Status: DC

## 2015-01-09 MED ORDER — ASPIRIN 81 MG PO CHEW
81.0000 mg | CHEWABLE_TABLET | ORAL | Status: AC
  Administered 2015-01-10: 81 mg via ORAL
  Filled 2015-01-09: qty 1

## 2015-01-09 MED ORDER — SODIUM CHLORIDE 0.9 % WEIGHT BASED INFUSION
3.0000 mL/kg/h | INTRAVENOUS | Status: DC
  Administered 2015-01-10: 3 mL/kg/h via INTRAVENOUS

## 2015-01-09 MED ORDER — SODIUM CHLORIDE 0.9 % IJ SOLN: 3.0000 mL | INTRAMUSCULAR | Status: DC | PRN

## 2015-01-09 MED ORDER — SODIUM CHLORIDE 0.9 % IV SOLN: 250.0000 mL | INTRAVENOUS | Status: DC | PRN

## 2015-01-09 MED ORDER — SODIUM CHLORIDE 0.9 % IJ SOLN
3.0000 mL | Freq: Two times a day (BID) | INTRAMUSCULAR | Status: DC
  Administered 2015-01-09 (×2): 3 mL via INTRAVENOUS

## 2015-01-09 NOTE — Progress Notes (Signed)
A& O. Ambulated in the hallway and tolerated it well. Plan is to do cath 12/19. Pt reports no CP. ROom air. NSR. Heparin drip at 18. Cath education was done to pt and family. Pt has no further concerns at this time.

## 2015-01-09 NOTE — Progress Notes (Signed)
[facility] at [place] NAME: Robert Greer    MR#:  [id number]  DATE OF BIRTH:  08/19/1955  SUBJECTIVE:  CHIEF COMPLAINT:   Chief Complaint  Patient presents with  . Chest Pain   No chest pain or shortness of breath. Up and walking in the hall with heparin drip.  REVIEW OF SYSTEMS:   Review of Systems  Constitutional: Negative for fever.  Respiratory: Negative for shortness of breath.   Cardiovascular: Positive for chest pain. Negative for palpitations.  Gastrointestinal: Negative for nausea, vomiting and abdominal pain.  Genitourinary: Negative for dysuria.   DRUG ALLERGIES:  No Known Allergies  VITALS:  Blood pressure 112/70, pulse 65, temperature 97.5 F (36.4 C), temperature source Oral, resp. rate 18, height 6' (1.829 m), weight 106.595 kg (235 lb), SpO2 95 %.  PHYSICAL EXAMINATION:  GENERAL:  59 y.o.-year-old patient standing up, no distress LUNGS: Normal breath sounds bilaterally, no wheezing, rales,rhonchi or crepitation. No use of accessory muscles of respiration.  CARDIOVASCULAR: S1, S2 normal. No murmurs, rubs, or gallops.  ABDOMEN: Soft, nontender, nondistended. Bowel sounds present. No organomegaly or mass.  EXTREMITIES: No pedal edema, cyanosis, or clubbing.  NEUROLOGIC: Cranial nerves II through XII are intact. Muscle strength 5/5 in all extremities. Sensation intact. Gait not checked.  PSYCHIATRIC: The patient is alert and oriented x 3.  SKIN: No obvious rash, lesion, or ulcer.   LABORATORY PANEL:   CBC  Recent Labs Lab 01/09/15 0341  WBC 13.2*  HGB 15.2  HCT 45.4  PLT 231   ------------------------------------------------------------------------------------------------------------------  Chemistries   Recent Labs Lab 01/08/15 0225  NA 140  K 4.4  CL 109  CO2 24  GLUCOSE 102*  BUN 17  CREATININE 1.00  CALCIUM 8.5*    ------------------------------------------------------------------------------------------------------------------  Cardiac Enzymes  Recent Labs Lab 01/09/15 0341  TROPONINI 9.24*   ------------------------------------------------------------------------------------------------------------------  RADIOLOGY:  No results found.  EKG:   Orders placed or performed during the hospital encounter of 01/07/15  . EKG 12-Lead  . EKG 12-Lead  . ED EKG within 10 minutes  . ED EKG within 10 minutes    ASSESSMENT AND PLAN:   1. NSTEMI - No pain today - Appreciate cardiology consultation - Continue IV heparin, Plavix started, cardiac catheterization planned for tomorrow - Echo normal, EF 60-65% - Troponin peaked at 10.08  2. Hyperlipidemia: - Increase to high intensity statin  3. Hypertension - Controlled. Continue metoprolol  4. Depression - Continue Cymbalta  All the records are reviewed and case discussed with Care Management/Social Workerr. Management plans discussed with the patient, family and they are in agreement.  CODE STATUS: Full  TOTAL TIME TAKING CARE OF THIS PATIENT: 30 minutes.  Greater than 50% of time spent in care coordination and counseling. POSSIBLE D/C IN 4 DAYS, DEPENDING ON CLINICAL CONDITION.   Myrtis Ser M.D on 01/09/2015 at 3:29 PM  Between 7am to 6pm - Pager - [phone]  After 6pm go to [email or web address] - password [email or web address] [facility]  Office  [phone]  CC: Primary care physician; Dicky Doe, MD

## 2015-01-09 NOTE — Progress Notes (Signed)
ANTICOAGULATION CONSULT NOTE -Follow up  Pharmacy Consult for Heparin  Indication: chest pain/ACS  No Known Allergies  Patient Measurements: Height: 6' (182.9 cm) Weight: 235 lb (106.595 kg) IBW/kg (Calculated) : 77.6 Heparin Dosing Weight: 99.9 kg  Vital Signs: Temp: 98.3 F (36.8 C) (12/17 2024) Temp Source: Oral (12/17 2024) BP: 140/72 mmHg (12/17 2024) Pulse Rate: 69 (12/17 2024)  Labs:  Recent Labs  01/07/15 1454  01/08/15 0225 01/08/15 0924 01/08/15 0944 01/08/15 1437 01/08/15 1438 01/08/15 2005 01/08/15 2006  HGB 15.3  --  14.2  --   --   --   --   --   --   HCT 46.1  --  43.0  --   --   --   --   --   --   PLT 264  --  224  --   --   --   --   --   --   APTT 30  --   --   --   --   --   --   --   --   LABPROT  --   --   --   --  13.5  --   --   --   --   INR  --   --   --   --  1.01  --   --   --   --   HEPARINUNFRC  --   < > 0.33  --  0.20*  --  0.14*  --  0.65  CREATININE 1.03  --  1.00  --   --   --   --   --   --   TROPONINI 0.03  < > 10.08* 9.73*  --  9.58*  --  9.69*  --   < > = values in this interval not displayed.  Estimated Creatinine Clearance: 97.9 mL/min (by C-G formula based on Cr of 1).   Medical History: Past Medical History  Diagnosis Date  . Hyperlipidemia   . Anxiety   . Mobitz (type) II atrioventricular block   . Arthritis   . Backache, unspecified   . Depressive disorder, not elsewhere classified   . Hypertension   . Mumps   . Measles     Medications:  Prescriptions prior to admission  Medication Sig Dispense Refill Last Dose  . atorvastatin (LIPITOR) 20 MG tablet Take 1 tablet (20 mg total) by mouth daily. 90 tablet 3 01/06/2015 at Unknown time  . cetirizine (ZYRTEC) 10 MG tablet Take 10 mg by mouth as needed for allergies.    PRN at PRN  . DULoxetine (CYMBALTA) 20 MG capsule Take 40 mg by mouth daily.   0 01/07/2015 at Unknown time  . meloxicam (MOBIC) 7.5 MG tablet Take 15 mg by mouth daily.    01/07/2015 at 0630  .  Multiple Vitamin (MULTIVITAMIN WITH MINERALS) TABS tablet Take 1 tablet by mouth daily.   01/07/2015 at Unknown time  . oxyCODONE-acetaminophen (PERCOCET) 10-325 MG tablet Take 1 tablet by mouth every 4 (four) hours as needed for pain.   01/07/2015 at 0630    Assessment: CrCl = 97.9 ml/min   Goal of Therapy:  Heparin level 0.3-0.7 units/ml Monitor platelets by anticoagulation protocol: Yes   Plan:  Give 4000 units bolus x 1 Start heparin infusion at 1300 units/hr  Will draw 1st HL 6 hrs after start of drip on 12/17 @ 1:30. Will check CBC daily.   12/17 02:30 heparin level 0.33.  Recheck in 6 hours to confirm. Next level 12/17 @ 0800.  12/17 Heparin level at 0944= 0.20. Will give Heparin bolus of 1500 units x 1 and increase Heparin Drip to 1500 units/hr. Next level in 6 hours today at 1600.  12/17 Heparin level at 1630= 0.14. Give Heparin bolus of 3000 units x 1 and increase Heparin drip to 1800 units/hr. Next Heparin level in 6 hrs at 2230.  12/17 2000 heparin level 0.65. Recheck with CBC in 6 hours to confirm.   Sim Boast PharmD Clinical Pharmacist 01/09/2015 ,12:27 AM

## 2015-01-09 NOTE — Progress Notes (Signed)
ANTICOAGULATION CONSULT NOTE -Follow up  Pharmacy Consult for Heparin  Indication: chest pain/ACS  No Known Allergies  Patient Measurements: Height: 6' (182.9 cm) Weight: 235 lb (106.595 kg) IBW/kg (Calculated) : 77.6 Heparin Dosing Weight: 99.9 kg  Vital Signs: Temp: 97.5 F (36.4 C) (12/18 0330) Temp Source: Oral (12/17 2024) BP: 108/76 mmHg (12/18 0330) Pulse Rate: 62 (12/18 0330)  Labs:  Recent Labs  01/07/15 1454  01/08/15 0225  01/08/15 0944 01/08/15 1437 01/08/15 1438 01/08/15 2005 01/08/15 2006 01/09/15 0341  HGB 15.3  --  14.2  --   --   --   --   --   --  15.2  HCT 46.1  --  43.0  --   --   --   --   --   --  45.4  PLT 264  --  224  --   --   --   --   --   --  231  APTT 30  --   --   --   --   --   --   --   --   --   LABPROT  --   --   --   --  13.5  --   --   --   --   --   INR  --   --   --   --  1.01  --   --   --   --   --   HEPARINUNFRC  --   < > 0.33  --  0.20*  --  0.14*  --  0.65 0.48  CREATININE 1.03  --  1.00  --   --   --   --   --   --   --   TROPONINI 0.03  < > 10.08*  < >  --  9.58*  --  9.69*  --  9.24*  < > = values in this interval not displayed.  Estimated Creatinine Clearance: 97.9 mL/min (by C-G formula based on Cr of 1).   Medical History: Past Medical History  Diagnosis Date  . Hyperlipidemia   . Anxiety   . Mobitz (type) II atrioventricular block   . Arthritis   . Backache, unspecified   . Depressive disorder, not elsewhere classified   . Hypertension   . Mumps   . Measles     Medications:  Prescriptions prior to admission  Medication Sig Dispense Refill Last Dose  . atorvastatin (LIPITOR) 20 MG tablet Take 1 tablet (20 mg total) by mouth daily. 90 tablet 3 01/06/2015 at Unknown time  . cetirizine (ZYRTEC) 10 MG tablet Take 10 mg by mouth as needed for allergies.    PRN at PRN  . DULoxetine (CYMBALTA) 20 MG capsule Take 40 mg by mouth daily.   0 01/07/2015 at Unknown time  . meloxicam (MOBIC) 7.5 MG tablet Take 15 mg  by mouth daily.    01/07/2015 at 0630  . Multiple Vitamin (MULTIVITAMIN WITH MINERALS) TABS tablet Take 1 tablet by mouth daily.   01/07/2015 at Unknown time  . oxyCODONE-acetaminophen (PERCOCET) 10-325 MG tablet Take 1 tablet by mouth every 4 (four) hours as needed for pain.   01/07/2015 at 0630    Assessment: CrCl = 97.9 ml/min   Goal of Therapy:  Heparin level 0.3-0.7 units/ml Monitor platelets by anticoagulation protocol: Yes   Plan:  Give 4000 units bolus x 1 Start heparin infusion at 1300 units/hr  Will draw 1st HL 6  hrs after start of drip on 12/17 @ 1:30. Will check CBC daily.   12/17 02:30 heparin level 0.33.  Recheck in 6 hours to confirm. Next level 12/17 @ 0800.  12/17 Heparin level at 0944= 0.20. Will give Heparin bolus of 1500 units x 1 and increase Heparin Drip to 1500 units/hr. Next level in 6 hours today at 1600.  12/17 Heparin level at 1630= 0.14. Give Heparin bolus of 3000 units x 1 and increase Heparin drip to 1800 units/hr. Next Heparin level in 6 hrs at 2230.  12/17 2000 heparin level 0.65. Recheck with CBC in 6 hours to confirm.   12/18 0330 heparin level 0.48. Continue current regimen. F/u CBC and heparin level tomorrow AM.   Sim Boast PharmD Clinical Pharmacist 01/09/2015 ,4:54 AM

## 2015-01-09 NOTE — Progress Notes (Signed)
A&O. Still on heparin drip at 18. Room air. No complaints.

## 2015-01-09 NOTE — Progress Notes (Signed)
SUBJECTIVE: The patient is doing well today.  At this time, he denies chest pain, shortness of breath, or any new concerns.  Marland Kitchen aspirin EC  325 mg Oral Daily  . atorvastatin  80 mg Oral Daily  . clopidogrel  75 mg Oral Daily  . docusate sodium  100 mg Oral BID  . DULoxetine  20 mg Oral BID  . loratadine  10 mg Oral Daily  . metoprolol tartrate  25 mg Oral Q6H  . nitroGLYCERIN  0.5 inch Topical 4 times per [place]  . sodium chloride  3 mL Intravenous Q12H   . heparin 1,800 Units/hr (01/08/15 2024)    OBJECTIVE: Physical Exam: Filed Vitals:   01/08/15 1346 01/08/15 1736 01/08/15 2024 01/09/15 0330  BP: 120/75 112/69 140/72 108/76  Pulse: 67 66 69 62  Temp:   98.3 F (36.8 C) 97.5 F (36.4 C)  TempSrc:   Oral Oral  Resp:   22 24  Height:      Weight:      SpO2:   95% 95%    Intake/Output Summary (Last 24 hours) at 01/09/15 0934 Last data filed at 01/09/15 0449  Gross per 24 hour  Intake 877.63 ml  Output   1650 ml  Net -772.37 ml    Telemetry reveals sinus rhythm  GEN- The patient is well appearing, alert and oriented x 3 today.   Head- normocephalic, atraumatic Eyes-  Sclera clear, conjunctiva pink Ears- hearing intact Oropharynx- clear Neck- supple, no JVP Lymph- no cervical lymphadenopathy Lungs- Clear to ausculation bilaterally, normal work of breathing Heart- Regular rate and rhythm, no murmurs, rubs or gallops, PMI not laterally displaced GI- soft, NT, ND, + BS Extremities- no clubbing, cyanosis, or edema Skin- no rash or lesion Psych- euthymic mood, full affect Neuro- strength and sensation are intact  LABS: Basic Metabolic Panel:  Recent Labs  01/07/15 1454 01/08/15 0225  NA 139 140  K 4.5 4.4  CL 103 109  CO2 28 24  GLUCOSE 101* 102*  BUN 17 17  CREATININE 1.03 1.00  CALCIUM 9.1 8.5*   TTE Study Conclusions  - Left ventricle: The cavity size was normal. There was moderate concentric hypertrophy. Systolic function was normal.  The estimated ejection fraction was in the range of 60% to 65%. Wall motion was normal; there were no regional wall motion abnormalities. Left ventricular diastolic function parameters were normal. - Mitral valve: There was mild regurgitation. - Right ventricle: Systolic function was normal. - Pulmonary arteries: Systolic pressure was within the normal range.  Liver Function Tests: No results for input(s): AST, ALT, ALKPHOS, BILITOT, PROT, ALBUMIN in the last 72 hours. No results for input(s): LIPASE, AMYLASE in the last 72 hours. CBC:  Recent Labs  01/08/15 0225 01/09/15 0341  WBC 8.8 13.2*  HGB 14.2 15.2  HCT 43.0 45.4  MCV 92.4 92.0  PLT 224 231   Cardiac Enzymes:  Recent Labs  01/08/15 1437 01/08/15 2005 01/09/15 0341  TROPONINI 9.58* 9.69* 9.24*   BNP: Invalid input(s): POCBNP D-Dimer: No results for input(s): DDIMER in the last 72 hours. Hemoglobin A1C:  Recent Labs  01/07/15 2054  HGBA1C 5.5   Fasting Lipid Panel:  Recent Labs  01/08/15 0225  CHOL 149  HDL 48  LDLCALC 78  TRIG 114  CHOLHDL 3.1   Thyroid Function Tests:  Recent Labs  01/07/15 2054  TSH 7.329*   Anemia Panel: No results for input(s): VITAMINB12, FOLATE, FERRITIN, TIBC, IRON, RETICCTPCT in the last 72  hours.  RADIOLOGY: Dg Chest 2 View  01/07/2015  CLINICAL DATA:  59 year old male with chest pain radiating to the jaw and left upper extremity. Initial encounter. EXAM: CHEST  2 VIEW COMPARISON:  Chest radiographs 10/28/2003 FINDINGS: Lung volumes remain within normal limits. Normal cardiac size and mediastinal contours. Visualized tracheal air column is within normal limits. No pneumothorax, pulmonary edema, pleural effusion or confluent pulmonary opacity. No acute osseous abnormality identified. Right shoulder arthroplasty is new since 2005. IMPRESSION: No acute cardiopulmonary abnormality. Electronically Signed   By: Genevie Ann M.D.   On: 01/07/2015 15:24     ASSESSMENT AND PLAN:  Active Problems:   Unstable angina (HCC) ASSESSMENT AND PLAN:   1. NSTEMI: With his history of chest pain and elevated troponins and without acute changes on his EKG he is currently having an an NSTEMI. He more Troponins peaked at 10.08. We'll continue him on IV heparin.  Started 300 mg of Plavix yesterday followed by 75 mg per [place]. We will set him up to have a cardiac cath with possible intervention on Monday. HR 60 and BP controlled, will not add beta blocker or ACEI at this time.  TTE normal.  2. Hyperlipidemia: Will increase his atorvastatin to 80 mg.   Will Meredith Leeds, MD 01/09/2015 9:34 AM

## 2015-01-10 ENCOUNTER — Encounter
Admission: EM | Disposition: A | Discharge status: Home / Self Care | Payer: Self-pay | Source: Home / Self Care | Attending: Internal Medicine

## 2015-01-10 ENCOUNTER — Telehealth: Payer: Self-pay | Admitting: *Deleted

## 2015-01-10 DIAGNOSIS — I252 Old myocardial infarction: Secondary | ICD-10-CM

## 2015-01-10 DIAGNOSIS — I2510 Atherosclerotic heart disease of native coronary artery without angina pectoris: Secondary | ICD-10-CM

## 2015-01-10 HISTORY — PX: CARDIAC CATHETERIZATION: SHX172

## 2015-01-10 LAB — CBC
HCT: 44.9 % (ref 40.0–52.0)
HEMOGLOBIN: 15.1 g/dL (ref 13.0–18.0)
MCH: 31.5 pg (ref 26.0–34.0)
MCHC: 33.6 g/dL (ref 32.0–36.0)
MCV: 93.9 fL (ref 80.0–100.0)
PLATELETS: 217 10*3/uL (ref 150–440)
RBC: 4.78 MIL/uL (ref 4.40–5.90)
RDW: 12.9 % (ref 11.5–14.5)
WBC: 10.9 10*3/uL — ABNORMAL HIGH (ref 3.8–10.6)

## 2015-01-10 LAB — HEPARIN LEVEL (UNFRACTIONATED): HEPARIN UNFRACTIONATED: 0.4 [IU]/mL (ref 0.30–0.70)

## 2015-01-10 SURGERY — LEFT HEART CATH AND CORONARY ANGIOGRAPHY
Anesthesia: Moderate Sedation

## 2015-01-10 MED ORDER — VERAPAMIL HCL 2.5 MG/ML IV SOLN
INTRAVENOUS | Status: DC | PRN
  Administered 2015-01-10: 2.5 mg via INTRA_ARTERIAL

## 2015-01-10 MED ORDER — TICAGRELOR 90 MG PO TABS
90.0000 mg | ORAL_TABLET | Freq: Two times a day (BID) | ORAL | Status: DC
  Administered 2015-01-11: 90 mg via ORAL
  Filled 2015-01-10 (×2): qty 1

## 2015-01-10 MED ORDER — FENTANYL CITRATE (PF) 100 MCG/2ML IJ SOLN
INTRAMUSCULAR | Status: DC | PRN
  Administered 2015-01-10 (×2): 50 ug via INTRAVENOUS
  Administered 2015-01-10 (×2): 25 ug via INTRAVENOUS

## 2015-01-10 MED ORDER — SODIUM CHLORIDE 0.9 % IV SOLN: 250.0000 mL | INTRAVENOUS | Status: DC | PRN

## 2015-01-10 MED ORDER — TICAGRELOR 90 MG PO TABS
ORAL_TABLET | ORAL | Status: AC
  Filled 2015-01-10: qty 2

## 2015-01-10 MED ORDER — ASPIRIN 81 MG PO CHEW
81.0000 mg | CHEWABLE_TABLET | Freq: Every day | ORAL | Status: DC
  Administered 2015-01-10 – 2015-01-11 (×2): 81 mg via ORAL
  Filled 2015-01-10 (×2): qty 1

## 2015-01-10 MED ORDER — VERAPAMIL HCL 2.5 MG/ML IV SOLN
INTRAVENOUS | Status: AC
  Filled 2015-01-10: qty 2

## 2015-01-10 MED ORDER — HEPARIN SODIUM (PORCINE) 1000 UNIT/ML IJ SOLN
INTRAMUSCULAR | Status: DC | PRN
  Administered 2015-01-10: 6000 [IU] via INTRAVENOUS
  Administered 2015-01-10 (×2): 2000 [IU] via INTRAVENOUS

## 2015-01-10 MED ORDER — SODIUM CHLORIDE 0.9 % IJ SOLN
3.0000 mL | Freq: Two times a day (BID) | INTRAMUSCULAR | Status: DC
Start: 2015-01-10 — End: 2015-01-10

## 2015-01-10 MED ORDER — SODIUM CHLORIDE 0.9 % IJ SOLN: 3.0000 mL | INTRAMUSCULAR | Status: DC | PRN

## 2015-01-10 MED ORDER — TICAGRELOR 90 MG PO TABS
ORAL_TABLET | ORAL | Status: DC | PRN
  Administered 2015-01-10: 180 mg via ORAL

## 2015-01-10 MED ORDER — LIDOCAINE HCL (PF) 1 % IJ SOLN
INTRAMUSCULAR | Status: DC | PRN
  Administered 2015-01-10: 10:00:00

## 2015-01-10 MED ORDER — NITROGLYCERIN 5 MG/ML IV SOLN
INTRAVENOUS | Status: AC
  Filled 2015-01-10: qty 10

## 2015-01-10 MED ORDER — SODIUM CHLORIDE 0.9 % IV SOLN
INTRAVENOUS | Status: DC
  Administered 2015-01-10 (×2): via INTRAVENOUS

## 2015-01-10 MED ORDER — MIDAZOLAM HCL 2 MG/2ML IJ SOLN
INTRAMUSCULAR | Status: AC
  Filled 2015-01-10: qty 2

## 2015-01-10 MED ORDER — FENTANYL CITRATE (PF) 100 MCG/2ML IJ SOLN
INTRAMUSCULAR | Status: AC
  Filled 2015-01-10: qty 2

## 2015-01-10 MED ORDER — MIDAZOLAM HCL 2 MG/2ML IJ SOLN
INTRAMUSCULAR | Status: DC | PRN
  Administered 2015-01-10: 1 mg via INTRAVENOUS
  Administered 2015-01-10 (×2): 0.5 mg via INTRAVENOUS

## 2015-01-10 MED ORDER — TIROFIBAN HCL IV 12.5 MG/250 ML
INTRAVENOUS | Status: DC | PRN
  Administered 2015-01-10: 0.15 ug/kg/min via INTRAVENOUS

## 2015-01-10 MED ORDER — IOHEXOL 300 MG/ML  SOLN
INTRAMUSCULAR | Status: DC | PRN
  Administered 2015-01-10: 335 mL via INTRAVENOUS

## 2015-01-10 MED ORDER — HEPARIN (PORCINE) IN NACL 2-0.9 UNIT/ML-% IJ SOLN
INTRAMUSCULAR | Status: AC
  Filled 2015-01-10: qty 1000

## 2015-01-10 MED ORDER — HEPARIN SODIUM (PORCINE) 1000 UNIT/ML IJ SOLN
INTRAMUSCULAR | Status: AC
  Filled 2015-01-10: qty 1

## 2015-01-10 MED ORDER — METOPROLOL TARTRATE 50 MG PO TABS
50.0000 mg | ORAL_TABLET | Freq: Two times a day (BID) | ORAL | Status: DC
  Administered 2015-01-10 – 2015-01-11 (×2): 50 mg via ORAL
  Filled 2015-01-10 (×2): qty 1

## 2015-01-10 SURGICAL SUPPLY — 20 items
BALLN ~~LOC~~ TREK RX 2.5X20 (BALLOONS) ×3
BALLN ~~LOC~~ TREK RX 2.75X12 (BALLOONS) ×3
BALLN ~~LOC~~ TREK RX 2.75X15 (BALLOONS) ×3
BALLOON ~~LOC~~ TREK RX 2.5X20 (BALLOONS) ×1 IMPLANT
BALLOON ~~LOC~~ TREK RX 2.75X12 (BALLOONS) ×1 IMPLANT
BALLOON ~~LOC~~ TREK RX 2.75X15 (BALLOONS) ×1 IMPLANT
CATH INFINITI 5FR ANG PIGTAIL (CATHETERS) ×3 IMPLANT
CATH OPTITORQUE JACKY 4.0 5F (CATHETERS) ×3 IMPLANT
CATH VISTA GUIDE 6FR XBLAD3.5 (CATHETERS) ×3 IMPLANT
DEVICE INFLAT 30 PLUS (MISCELLANEOUS) ×3 IMPLANT
DEVICE RAD COMP TR BAND LRG (VASCULAR PRODUCTS) ×3 IMPLANT
GLIDESHEATH SLEND SS 6F .021 (SHEATH) ×3 IMPLANT
GUIDELINER 6F (CATHETERS) ×3 IMPLANT
KIT MANI 3VAL PERCEP (MISCELLANEOUS) ×3 IMPLANT
PACK CARDIAC CATH (CUSTOM PROCEDURE TRAY) ×3 IMPLANT
STENT RESOLUTE INTEG 2.5X22 (Permanent Stent) ×3 IMPLANT
STENT RESOLUTE INTEG 2.75X12 (Permanent Stent) ×3 IMPLANT
STENT RESOLUTE INTEG 2.75X22 (Permanent Stent) ×3 IMPLANT
WIRE RUNTHROUGH .014X180CM (WIRE) ×6 IMPLANT
WIRE SAFE-T 1.5MM-J .035X260CM (WIRE) ×3 IMPLANT

## 2015-01-10 NOTE — Progress Notes (Signed)
[facility] [place] NAME: Robert Greer    MR#:  [id number]  DATE OF BIRTH:  1956/01/22  SUBJECTIVE:  CHIEF COMPLAINT:   Chief Complaint  Patient presents with  . Chest Pain   Seen after cath. Doing well, no complaints.  REVIEW OF SYSTEMS:   Review of Systems  Constitutional: Negative for fever.  Respiratory: Negative for shortness of breath.   Cardiovascular: Positive for chest pain. Negative for palpitations.  Gastrointestinal: Negative for nausea, vomiting and abdominal pain.  Genitourinary: Negative for dysuria.   DRUG ALLERGIES:  No Known Allergies  VITALS:  Blood pressure 126/71, pulse 70, temperature 99.3 F (37.4 C), temperature source Oral, resp. rate 20, height 6' (1.829 m), weight 106.595 kg (235 lb), SpO2 95 %.  PHYSICAL EXAMINATION:  GENERAL:  59 y.o.-year-old patient sitting up, no distress LUNGS: Normal breath sounds bilaterally, no wheezing, rales,rhonchi or crepitation. No use of accessory muscles of respiration.  CARDIOVASCULAR: S1, S2 normal. No murmurs, rubs, or gallops.  ABDOMEN: Soft, nontender, nondistended. Bowel sounds present. No organomegaly or mass.  EXTREMITIES: No pedal edema, cyanosis, or clubbing.  NEUROLOGIC: Cranial nerves II through XII are intact. Muscle strength 5/5 in all extremities. Sensation intact. Gait not checked.  PSYCHIATRIC: The patient is alert and oriented x 3.  SKIN: No obvious rash, lesion, or ulcer.   LABORATORY PANEL:   CBC  Recent Labs Lab 01/10/15 0435  WBC 10.9*  HGB 15.1  HCT 44.9  PLT 217   ------------------------------------------------------------------------------------------------------------------  Chemistries   Recent Labs Lab 01/08/15 0225  NA 140  K 4.4  CL 109  CO2 24  GLUCOSE 102*  BUN 17  CREATININE 1.00  CALCIUM 8.5*    ------------------------------------------------------------------------------------------------------------------  Cardiac Enzymes  Recent Labs Lab 01/09/15 0341  TROPONINI 9.24*   ------------------------------------------------------------------------------------------------------------------  RADIOLOGY:  No results found.  EKG:   Orders placed or performed during the hospital encounter of 01/07/15  . EKG 12-Lead  . EKG 12-Lead  . ED EKG within 10 minutes  . ED EKG within 10 minutes  . EKG 12-Lead immediately post procedure  . EKG 12-Lead  . EKG 12-Lead immediately post procedure    ASSESSMENT AND PLAN:   1. NSTEMI - cath today with multivessel disease, sp PCI - continue brilinta, asa, metoprolol, atrovastatin  - Echo normal, EF 60-65% - Troponin peaked at 10.08  2. Hyperlipidemia: - Increase to high intensity statin  3. Hypertension - Controlled. Continue metoprolol  4. Depression - Continue Cymbalta  All the records are reviewed and case discussed with Care Management/Social Workerr. Management plans discussed with the patient, family and they are in agreement.  CODE STATUS: Full  TOTAL TIME TAKING CARE OF THIS PATIENT: 30 minutes.  Greater than 50% of time spent in care coordination and counseling. POSSIBLE D/C IN 1 DAYS, DEPENDING ON CLINICAL CONDITION.   Myrtis Ser M.D on 01/10/2015 at 3:51 PM  Between 7am to 6pm - Pager - [phone]  After 6pm go to [email or web address] - password [email or web address]  Office  [phone]  CC: Primary care physician; Dicky Doe, MD

## 2015-01-10 NOTE — Progress Notes (Signed)
Pt is alert and oriented x 4, denies pain, heparin drip infusing at start of shift, cardiac cath performed, on room air, up in room independently, initial loading dose of brilinta given in cath lab , on IV aggrastat following cardiac cath, Normal saline infusing, incision site dry and intact. Family at bedside, resting in bed quietly.

## 2015-01-10 NOTE — H&P (View-Only) (Signed)
SUBJECTIVE: The patient is doing well today.  At this time, he denies chest pain, shortness of breath, or any new concerns.  Marland Kitchen aspirin EC  325 mg Oral Daily  . atorvastatin  80 mg Oral Daily  . clopidogrel  75 mg Oral Daily  . docusate sodium  100 mg Oral BID  . DULoxetine  20 mg Oral BID  . loratadine  10 mg Oral Daily  . metoprolol tartrate  25 mg Oral Q6H  . nitroGLYCERIN  0.5 inch Topical 4 times per [place]  . sodium chloride  3 mL Intravenous Q12H   . heparin 1,800 Units/hr (01/08/15 2024)    OBJECTIVE: Physical Exam: Filed Vitals:   01/08/15 1346 01/08/15 1736 01/08/15 2024 01/09/15 0330  BP: 120/75 112/69 140/72 108/76  Pulse: 67 66 69 62  Temp:   98.3 F (36.8 C) 97.5 F (36.4 C)  TempSrc:   Oral Oral  Resp:   22 24  Height:      Weight:      SpO2:   95% 95%    Intake/Output Summary (Last 24 hours) at 01/09/15 0934 Last data filed at 01/09/15 0449  Gross per 24 hour  Intake 877.63 ml  Output   1650 ml  Net -772.37 ml    Telemetry reveals sinus rhythm  GEN- The patient is well appearing, alert and oriented x 3 today.   Head- normocephalic, atraumatic Eyes-  Sclera clear, conjunctiva pink Ears- hearing intact Oropharynx- clear Neck- supple, no JVP Lymph- no cervical lymphadenopathy Lungs- Clear to ausculation bilaterally, normal work of breathing Heart- Regular rate and rhythm, no murmurs, rubs or gallops, PMI not laterally displaced GI- soft, NT, ND, + BS Extremities- no clubbing, cyanosis, or edema Skin- no rash or lesion Psych- euthymic mood, full affect Neuro- strength and sensation are intact  LABS: Basic Metabolic Panel:  Recent Labs  01/07/15 1454 01/08/15 0225  NA 139 140  K 4.5 4.4  CL 103 109  CO2 28 24  GLUCOSE 101* 102*  BUN 17 17  CREATININE 1.03 1.00  CALCIUM 9.1 8.5*   TTE Study Conclusions  - Left ventricle: The cavity size was normal. There was moderate concentric hypertrophy. Systolic function was normal.  The estimated ejection fraction was in the range of 60% to 65%. Wall motion was normal; there were no regional wall motion abnormalities. Left ventricular diastolic function parameters were normal. - Mitral valve: There was mild regurgitation. - Right ventricle: Systolic function was normal. - Pulmonary arteries: Systolic pressure was within the normal range.  Liver Function Tests: No results for input(s): AST, ALT, ALKPHOS, BILITOT, PROT, ALBUMIN in the last 72 hours. No results for input(s): LIPASE, AMYLASE in the last 72 hours. CBC:  Recent Labs  01/08/15 0225 01/09/15 0341  WBC 8.8 13.2*  HGB 14.2 15.2  HCT 43.0 45.4  MCV 92.4 92.0  PLT 224 231   Cardiac Enzymes:  Recent Labs  01/08/15 1437 01/08/15 2005 01/09/15 0341  TROPONINI 9.58* 9.69* 9.24*   BNP: Invalid input(s): POCBNP D-Dimer: No results for input(s): DDIMER in the last 72 hours. Hemoglobin A1C:  Recent Labs  01/07/15 2054  HGBA1C 5.5   Fasting Lipid Panel:  Recent Labs  01/08/15 0225  CHOL 149  HDL 48  LDLCALC 78  TRIG 114  CHOLHDL 3.1   Thyroid Function Tests:  Recent Labs  01/07/15 2054  TSH 7.329*   Anemia Panel: No results for input(s): VITAMINB12, FOLATE, FERRITIN, TIBC, IRON, RETICCTPCT in the last 72  hours.  RADIOLOGY: Dg Chest 2 View  01/07/2015  CLINICAL DATA:  59 year old male with chest pain radiating to the jaw and left upper extremity. Initial encounter. EXAM: CHEST  2 VIEW COMPARISON:  Chest radiographs 10/28/2003 FINDINGS: Lung volumes remain within normal limits. Normal cardiac size and mediastinal contours. Visualized tracheal air column is within normal limits. No pneumothorax, pulmonary edema, pleural effusion or confluent pulmonary opacity. No acute osseous abnormality identified. Right shoulder arthroplasty is new since 2005. IMPRESSION: No acute cardiopulmonary abnormality. Electronically Signed   By: Genevie Ann M.D.   On: 01/07/2015 15:24     ASSESSMENT AND PLAN:  Active Problems:   Unstable angina (HCC) ASSESSMENT AND PLAN:   1. NSTEMI: With his history of chest pain and elevated troponins and without acute changes on his EKG he is currently having an an NSTEMI. He more Troponins peaked at 10.08. We'll continue him on IV heparin.  Started 300 mg of Plavix yesterday followed by 75 mg per [place]. We Arielys Wandersee set him up to have a cardiac cath with possible intervention on Monday. HR 60 and BP controlled, Deren Degrazia not add beta blocker or ACEI at this time.  TTE normal.  2. Hyperlipidemia: Kimbely Whiteaker increase his atorvastatin to 80 mg.   Robert Bickle Meredith Leeds, MD 01/09/2015 9:34 AM

## 2015-01-10 NOTE — Progress Notes (Signed)
Per MD, restart 2 g sodium diet, Dr. Volanda Napoleon via telephone

## 2015-01-10 NOTE — Interval H&P Note (Signed)
History and Physical Interval Note:  01/10/2015 9:38 AM  Robert Greer  has presented today for surgery, with the diagnosis of chest pain  The various methods of treatment have been discussed with the patient and family. After consideration of risks, benefits and other options for treatment, the patient has consented to  Procedure(s): Left Heart Cath and Coronary Angiography (N/A) as a surgical intervention .  The patient's history has been reviewed, patient examined, no change in status, stable for surgery.  I have reviewed the patient's chart and labs.  Questions were answered to the patient's satisfaction.     Kathlyn Sacramento

## 2015-01-10 NOTE — OR Nursing (Signed)
Pt alert and oriented upon arrival and etCO2 d/c upon transfer to [facility] and initial etco2 reading28.. Pain MSCP tightness 5/10 MD aware and check post EKG. No treatment required at this time per MD

## 2015-01-10 NOTE — Telephone Encounter (Signed)
lmov to schedule a new patient hopsital fu

## 2015-01-10 NOTE — Progress Notes (Signed)
ANTICOAGULATION CONSULT NOTE -Follow up  Pharmacy Consult for Heparin  Indication: chest pain/ACS  No Known Allergies  Patient Measurements: Height: 6' (182.9 cm) Weight: 235 lb (106.595 kg) IBW/kg (Calculated) : 77.6 Heparin Dosing Weight: 99.9 kg  Vital Signs: Temp: 97.9 F (36.6 C) (12/18 2001) Temp Source: Oral (12/18 2001) BP: 116/74 mmHg (12/19 0331) Pulse Rate: 69 (12/19 0331)  Labs:  Recent Labs  01/07/15 1454  01/08/15 0225  01/08/15 0944 01/08/15 1437  01/08/15 2005 01/08/15 2006 01/09/15 0341 01/10/15 0435  HGB 15.3  --  14.2  --   --   --   --   --   --  15.2 15.1  HCT 46.1  --  43.0  --   --   --   --   --   --  45.4 44.9  PLT 264  --  224  --   --   --   --   --   --  231 217  APTT 30  --   --   --   --   --   --   --   --   --   --   LABPROT  --   --   --   --  13.5  --   --   --   --   --   --   INR  --   --   --   --  1.01  --   --   --   --   --   --   HEPARINUNFRC  --   < > 0.33  --  0.20*  --   < >  --  0.65 0.48 0.40  CREATININE 1.03  --  1.00  --   --   --   --   --   --   --   --   TROPONINI 0.03  < > 10.08*  < >  --  9.58*  --  9.69*  --  9.24*  --   < > = values in this interval not displayed.  Estimated Creatinine Clearance: 97.9 mL/min (by C-G formula based on Cr of 1).   Medical History: Past Medical History  Diagnosis Date  . Hyperlipidemia   . Anxiety   . Mobitz (type) II atrioventricular block   . Arthritis   . Backache, unspecified   . Depressive disorder, not elsewhere classified   . Hypertension   . Mumps   . Measles     Medications:  Prescriptions prior to admission  Medication Sig Dispense Refill Last Dose  . atorvastatin (LIPITOR) 20 MG tablet Take 1 tablet (20 mg total) by mouth daily. 90 tablet 3 01/06/2015 at Unknown time  . cetirizine (ZYRTEC) 10 MG tablet Take 10 mg by mouth as needed for allergies.    PRN at PRN  . DULoxetine (CYMBALTA) 20 MG capsule Take 40 mg by mouth daily.   0 01/07/2015 at Unknown time   . meloxicam (MOBIC) 7.5 MG tablet Take 15 mg by mouth daily.    01/07/2015 at 0630  . Multiple Vitamin (MULTIVITAMIN WITH MINERALS) TABS tablet Take 1 tablet by mouth daily.   01/07/2015 at Unknown time  . oxyCODONE-acetaminophen (PERCOCET) 10-325 MG tablet Take 1 tablet by mouth every 4 (four) hours as needed for pain.   01/07/2015 at 0630    Assessment: CrCl = 97.9 ml/min   Goal of Therapy:  Heparin level 0.3-0.7 units/ml Monitor platelets by anticoagulation protocol: Yes  Plan:  Give 4000 units bolus x 1 Start heparin infusion at 1300 units/hr  Will draw 1st HL 6 hrs after start of drip on 12/17 @ 1:30. Will check CBC daily.   12/17 02:30 heparin level 0.33.  Recheck in 6 hours to confirm. Next level 12/17 @ 0800.  12/17 Heparin level at 0944= 0.20. Will give Heparin bolus of 1500 units x 1 and increase Heparin Drip to 1500 units/hr. Next level in 6 hours today at 1600.  12/17 Heparin level at 1630= 0.14. Give Heparin bolus of 3000 units x 1 and increase Heparin drip to 1800 units/hr. Next Heparin level in 6 hrs at 2230.  12/17 2000 heparin level 0.65. Recheck with CBC in 6 hours to confirm.   12/18 0330 heparin level 0.48. Continue current regimen. F/u CBC and heparin level tomorrow AM.  12/19 0400 heparin level 0.4. Continue current regimen and recheck CBC and heparin level tomorrow AM.   Sim Boast PharmD Clinical Pharmacist 01/10/2015 ,5:47 AM

## 2015-01-11 ENCOUNTER — Encounter: Payer: Self-pay | Admitting: Cardiovascular Disease

## 2015-01-11 ENCOUNTER — Other Ambulatory Visit: Payer: Self-pay | Admitting: Cardiovascular Disease

## 2015-01-11 DIAGNOSIS — R079 Chest pain, unspecified: Secondary | ICD-10-CM

## 2015-01-11 DIAGNOSIS — I200 Unstable angina: Secondary | ICD-10-CM

## 2015-01-11 DIAGNOSIS — R7989 Other specified abnormal findings of blood chemistry: Secondary | ICD-10-CM

## 2015-01-11 LAB — CBC
HEMATOCRIT: 44 % (ref 40.0–52.0)
HEMOGLOBIN: 14.6 g/dL (ref 13.0–18.0)
MCH: 31.1 pg (ref 26.0–34.0)
MCHC: 33.3 g/dL (ref 32.0–36.0)
MCV: 93.6 fL (ref 80.0–100.0)
Platelets: 249 10*3/uL (ref 150–440)
RBC: 4.71 MIL/uL (ref 4.40–5.90)
RDW: 13.1 % (ref 11.5–14.5)
WBC: 10.5 10*3/uL (ref 3.8–10.6)

## 2015-01-11 LAB — BASIC METABOLIC PANEL
Anion gap: 4 — ABNORMAL LOW (ref 5–15)
BUN: 12 mg/dL (ref 6–20)
CALCIUM: 8.9 mg/dL (ref 8.9–10.3)
CHLORIDE: 109 mmol/L (ref 101–111)
CO2: 28 mmol/L (ref 22–32)
CREATININE: 1.12 mg/dL (ref 0.61–1.24)
GFR calc Af Amer: >60 mL/min (ref >60)
GFR calc non Af Amer: >60 mL/min (ref >60)
GLUCOSE: 114 mg/dL — AB (ref 65–99)
Potassium: 4.3 mmol/L (ref 3.5–5.1)
Sodium: 141 mmol/L (ref 135–145)

## 2015-01-11 MED ORDER — EZETIMIBE 10 MG PO TABS: 10.0000 mg | ORAL_TABLET | Freq: Every day | ORAL | Status: DC

## 2015-01-11 MED ORDER — NITROGLYCERIN 0.4 MG SL SUBL: 0.4000 mg | SUBLINGUAL_TABLET | SUBLINGUAL | Status: DC | PRN

## 2015-01-11 MED ORDER — TICAGRELOR 90 MG PO TABS: 90.0000 mg | ORAL_TABLET | Freq: Two times a day (BID) | ORAL | Status: DC

## 2015-01-11 MED ORDER — ASPIRIN 81 MG PO CHEW: 81.0000 mg | CHEWABLE_TABLET | Freq: Every day | ORAL | Status: DC

## 2015-01-11 MED ORDER — METOPROLOL TARTRATE 50 MG PO TABS: 50.0000 mg | ORAL_TABLET | Freq: Two times a day (BID) | ORAL | Status: DC

## 2015-01-11 MED ORDER — ATORVASTATIN CALCIUM 80 MG PO TABS: 80.0000 mg | ORAL_TABLET | Freq: Every day | ORAL | Status: DC

## 2015-01-11 NOTE — Care Management (Signed)
Patient has Pharmacist, community and confirms that drug coverage is included.  To discharge home on new Brilinta and provided with coupon for 30 Zafar free and copay assist

## 2015-01-11 NOTE — Discharge Instructions (Signed)
° °  DIET:  °Cardiac diet and Low fat, Low cholesterol diet ° °DISCHARGE CONDITION:  °Fair ° °ACTIVITY:  °Activity as tolerated ° °OXYGEN:  °Home Oxygen: No. °  °Oxygen Delivery: room air ° °DISCHARGE LOCATION:  °home  ° °If you experience worsening of your admission symptoms, develop shortness of breath, life threatening emergency, suicidal or homicidal thoughts you must seek medical attention immediately by calling 911 or calling your MD immediately  if symptoms less severe. ° °You Must read complete instructions/literature along with all the possible adverse reactions/side effects for all the Medicines you take and that have been prescribed to you. Take any new Medicines after you have completely understood and accpet all the possible adverse reactions/side effects.  ° °Please note ° °You were cared for by a hospitalist during your hospital stay. If you have any questions about your discharge medications or the care you received while you were in the hospital after you are discharged, you can call the unit and asked to speak with the hospitalist on call if the hospitalist that took care of you is not available. Once you are discharged, your primary care physician will handle any further medical issues. Please note that NO REFILLS for any discharge medications will be authorized once you are discharged, as it is imperative that you return to your primary care physician (or establish a relationship with a primary care physician if you do not have one) for your aftercare needs so that they can reassess your need for medications and monitor your lab values. ° ° °

## 2015-01-11 NOTE — Progress Notes (Signed)
IV discontinued without incident. Home medication and follow-up appointment information reviewed. Pt verbalized understanding without any questions.

## 2015-01-11 NOTE — Progress Notes (Signed)
Patient: Robert Greer / Admit Date: 01/07/2015 / Date of Encounter: 01/11/2015, 10:02 AM   Subjective: Denies any significant chest pain overnight Ambulating without symptoms, ready to go home Reports he has taken Lipitor for many years, nondiabetic, nonsmoker Anginal sx: chest pain to jaw  Review of Systems: Review of Systems  Constitutional: Negative.   Respiratory: Negative.   Cardiovascular: Negative.   Gastrointestinal: Negative.   Musculoskeletal: Negative.   Neurological: Negative.   Psychiatric/Behavioral: Negative.   All other systems reviewed and are negative.    Objective: Telemetry: NSR Physical Exam: Blood pressure 122/74, pulse 68, temperature 98.6 F (37 C), temperature source Oral, resp. rate 16, height 6' (1.829 m), weight 235 lb (106.595 kg), SpO2 98 %. Body mass index is 31.86 kg/(m^2). General: Well developed, well nourished, in no acute distress. Head: Normocephalic, atraumatic, sclera non-icteric, no xanthomas, nares are without discharge. Neck: Negative for carotid bruits. JVP not elevated. Lungs: Clear bilaterally to auscultation without wheezes, rales, or rhonchi. Breathing is unlabored. Heart: RRR S1 S2 without murmurs, rubs, or gallops.  Abdomen: Soft, non-tender, non-distended with normoactive bowel sounds. No rebound/guarding. Extremities: No clubbing or cyanosis. No edema. Distal pedal pulses are 2+ and equal bilaterally. Neuro: Alert and oriented X 3. Moves all extremities spontaneously. Psych:  Responds to questions appropriately with a normal affect.   Intake/Output Summary (Last 24 hours) at 01/11/15 1002 Last data filed at 01/11/15 0959  Gross per 24 hour  Intake    480 ml  Output      0 ml  Net    480 ml    Inpatient Medications:  . aspirin  81 mg Oral Daily  . atorvastatin  80 mg Oral Daily  . docusate sodium  100 mg Oral BID  . DULoxetine  20 mg Oral BID  . loratadine  10 mg Oral Daily  . metoprolol tartrate  50 mg Oral BID   . nitroGLYCERIN  0.5 inch Topical 4 times per [place]  . ticagrelor  90 mg Oral BID   Infusions:    Labs:  Recent Labs  01/11/15 0507  NA 141  K 4.3  CL 109  CO2 28  GLUCOSE 114*  BUN 12  CREATININE 1.12  CALCIUM 8.9   No results for input(s): AST, ALT, ALKPHOS, BILITOT, PROT, ALBUMIN in the last 72 hours.  Recent Labs  01/10/15 0435 01/11/15 0507  WBC 10.9* 10.5  HGB 15.1 14.6  HCT 44.9 44.0  MCV 93.9 93.6  PLT 217 249    Recent Labs  01/08/15 1437 01/08/15 2005 01/09/15 0341  TROPONINI 9.58* 9.69* 9.24*   Invalid input(s): POCBNP No results for input(s): HGBA1C in the last 72 hours.   Weights: Filed Weights   01/07/15 1450  Weight: 235 lb (106.595 kg)     Radiology/Studies:  Dg Chest 2 View  01/07/2015  CLINICAL DATA:  59 year old male with chest pain radiating to the jaw and left upper extremity. Initial encounter. EXAM: CHEST  2 VIEW COMPARISON:  Chest radiographs 10/28/2003 FINDINGS: Lung volumes remain within normal limits. Normal cardiac size and mediastinal contours. Visualized tracheal air column is within normal limits. No pneumothorax, pulmonary edema, pleural effusion or confluent pulmonary opacity. No acute osseous abnormality identified. Right shoulder arthroplasty is new since 2005. IMPRESSION: No acute cardiopulmonary abnormality. Electronically Signed   By: Genevie Ann M.D.   On: 01/07/2015 15:24     Assessment and Plan  59 y.o. male   1. NSTEMI:  Several stents, drug-coated,  placed to the LAD [place] RCA  Currently without symptoms, ready to go home  Long discussion concerning his risk factors, hyperlipidemia  He is not a smoker, no diabetes  ----Recommended he increase Lipitor to 80 mg daily, also will start  Zetia 10 mg daily.     2. Hyperlipidemia:  Will increase his atorvastatin to 80 mg.  Add zetia 10 mg daily  3. Anxiety/depression Prior history of palpitations, not an active issue on last office visit August 2016  we'll  arrange follow-up in clinic   Signed, Esmond Plants, MD [place] 01/11/2015, 10:02 AM

## 2015-01-15 NOTE — Discharge Summary (Signed)
[facility] [place] NAME: Robert Greer    MR#:  [id number]  DATE OF BIRTH:  12-17-1955  DATE OF ADMISSION:  01/07/2015 ADMITTING PHYSICIAN: Theodoro Grist, MD  DATE OF DISCHARGE: 01/11/2015  PRIMARY CARE PHYSICIAN: Dicky Doe, MD    ADMISSION DIAGNOSIS:  Precordial pain [R07.2] Troponin I above reference range [R79.89]  DISCHARGE DIAGNOSIS:  Active Problems:   Unstable angina (HCC)   NSTEMI (non-ST elevated myocardial infarction) ([place])   Pain in the chest   Troponin I above reference range   SECONDARY DIAGNOSIS:   Past Medical History  Diagnosis Date  . Hyperlipidemia   . Anxiety   . Mobitz (type) II atrioventricular block   . Arthritis   . Backache, unspecified   . Depressive disorder, not elsewhere classified   . Hypertension   . Mumps   . Measles     HOSPITAL COURSE:    1. NSTEMI: - cath 12/19 with multivessel disease, sp PCI 3 in the LAD, drug eluding - continue brilinta, asa, metoprolol, atrovastatin 80 - Echo normal, EF 60-65% - Troponin peaked at 10.08  2. Hyperlipidemia: - Increase to high intensity statin. Dr. Candis Musa will start  3. Hypertension - Controlled. Continue metoprolol  4. Depression - Continue Cymbalta  DISCHARGE CONDITIONS:   They will  CONSULTS OBTAINED:  Treatment Team:  Will Meredith Leeds, MD  DRUG ALLERGIES:  No Known Allergies  DISCHARGE MEDICATIONS:   Discharge Medication List as of 01/11/2015  9:19 AM    START taking these medications   Details  aspirin 81 MG chewable tablet Chew 1 tablet (81 mg total) by mouth daily., Starting 01/11/2015, Until Discontinued, Print    metoprolol (LOPRESSOR) 50 MG tablet Take 1 tablet (50 mg total) by mouth 2 (two) times daily., Starting 01/11/2015, Until Discontinued, Print    ticagrelor (BRILINTA) 90 MG TABS tablet Take 1 tablet (90 mg total) by mouth 2 (two) times daily., Starting 01/11/2015, Until  Discontinued, Print      CONTINUE these medications which have CHANGED   Details  atorvastatin (LIPITOR) 80 MG tablet Take 1 tablet (80 mg total) by mouth daily., Starting 01/11/2015, Until Discontinued, Print      CONTINUE these medications which have NOT CHANGED   Details  cetirizine (ZYRTEC) 10 MG tablet Take 10 mg by mouth as needed for allergies. , Until Discontinued, Historical Med    DULoxetine (CYMBALTA) 20 MG capsule Take 40 mg by mouth daily. , Until Discontinued, Historical Med    meloxicam (MOBIC) 7.5 MG tablet Take 15 mg by mouth daily. , Until Discontinued, Historical Med    Multiple Vitamin (MULTIVITAMIN WITH MINERALS) TABS tablet Take 1 tablet by mouth daily., Until Discontinued, Historical Med    oxyCODONE-acetaminophen (PERCOCET) 10-325 MG tablet Take 1 tablet by mouth every 4 (four) hours as needed for pain., Until Discontinued, Historical Med      STOP taking these medications     oxyCODONE (OXY IR/ROXICODONE) 5 MG immediate release tablet          DISCHARGE INSTRUCTIONS:   Heart healthy diet. No home health needs. No home oxygen. Activity as tolerated.   If you experience worsening of your admission symptoms, develop shortness of breath, life threatening emergency, suicidal or homicidal thoughts you must seek medical attention immediately by calling 911 or calling your MD immediately  if symptoms less severe.  You Must read complete instructions/literature along with all the possible adverse reactions/side effects for  all the Medicines you take and that have been prescribed to you. Take any new Medicines after you have completely understood and accept all the possible adverse reactions/side effects.   Please note  You were cared for by a hospitalist during your hospital stay. If you have any questions about your discharge medications or the care you received while you were in the hospital after you are discharged, you can call the unit and asked to speak  with the hospitalist on call if the hospitalist that took care of you is not available. Once you are discharged, your primary care physician will handle any further medical issues. Please note that NO REFILLS for any discharge medications will be authorized once you are discharged, as it is imperative that you return to your primary care physician (or establish a relationship with a primary care physician if you do not have one) for your aftercare needs so that they can reassess your need for medications and monitor your lab values.    Today   CHIEF COMPLAINT:   Chief Complaint  Patient presents with  . Chest Pain    HISTORY OF PRESENT ILLNESS:  Robert Greer is a 59 y.o. male with a known history of lipidemia, unspecified arrhythmia for which patient underwent cardiac workup approximately 40 years ago with Dr. Candis Musa who presents to the hospital with complaints of chest pain. Patient does believe he was driving at around 1 PM and started having tightness in the midsternal area radiating to his neck and jaws. Pain was very intense, 9-10 out of 10 by intensity and lasted for 45 minutes. Even now in emergency room , 7 hours later, he still has some discomfort in his chest. Pain was accompanied by nausea as well as dyspnea and the diaphoresis. Patient's EKG in the emergency room was unremarkable except of nonspecific ST-T abnormality. Patient's second troponin was found to be elevated at 0.63 and hospitalist services were contacted for admission of this patient with unstable angina. Of note, patient's chest pain subsided with GI cocktail.   VITAL SIGNS:  Blood pressure 122/74, pulse 68, temperature 98.6 F (37 C), temperature source Oral, resp. rate 16, height 6' (1.829 m), weight 106.595 kg (235 lb), SpO2 98 %.  I/O:  No intake or output data in the 24 hours ending 01/15/15 1546  PHYSICAL EXAMINATION:  GENERAL:  59 y.o.-year-old patient lying in the bed with no acute distress.  LUNGS: Normal  breath sounds bilaterally, no wheezing, rales,rhonchi or crepitation. No use of accessory muscles of respiration.  CARDIOVASCULAR: S1, S2 normal. No murmurs, rubs, or gallops.  ABDOMEN: Soft, non-tender, non-distended. Bowel sounds present. No organomegaly or mass.  EXTREMITIES: No pedal edema, cyanosis, or clubbing.  NEUROLOGIC: Cranial nerves II through XII are intact. Muscle strength 5/5 in all extremities. Sensation intact. Gait not checked.  PSYCHIATRIC: The patient is alert and oriented x 3.  SKIN: No obvious rash, lesion, or ulcer.   DATA REVIEW:   CBC  Recent Labs Lab 01/11/15 0507  WBC 10.5  HGB 14.6  HCT 44.0  PLT 249    Chemistries   Recent Labs Lab 01/11/15 0507  NA 141  K 4.3  CL 109  CO2 28  GLUCOSE 114*  BUN 12  CREATININE 1.12  CALCIUM 8.9    Cardiac Enzymes  Recent Labs Lab 01/09/15 0341  TROPONINI 9.24*    Microbiology Results  No results found for this or any previous visit.  RADIOLOGY:  No results found.  EKG:  Orders placed or performed during the hospital encounter of 01/07/15  . EKG 12-Lead  . EKG 12-Lead  . ED EKG within 10 minutes  . ED EKG within 10 minutes  . EKG 12-Lead immediately post procedure  . EKG 12-Lead  . EKG 12-Lead immediately post procedure  . EKG 12-Lead  . EKG      Management plans discussed with the patient, family and they are in agreement.  CODE STATUS:  Advance Directive Documentation        Most Recent Value   Type of Advance Directive  Healthcare Power of Attorney, Living will   Pre-existing out of facility DNR order (yellow form or pink MOST form)     "MOST" Form in Place?        TOTAL TIME TAKING CARE OF THIS PATIENT: 35 minutes.  Greater than 50% of time spent in care coordination and counseling.  Myrtis Ser M.D on 01/15/2015 at 3:46 PM  Between 7am to 6pm - Pager - [phone]  After 6pm go to [email or web address] - password [email or web address] [facility]  Office   [phone]  CC: Primary care physician; Dicky Doe, MD

## 2015-01-31 ENCOUNTER — Inpatient Hospital Stay: Payer: BLUE CROSS/BLUE SHIELD | Admitting: Family Medicine

## 2015-02-09 ENCOUNTER — Ambulatory Visit (INDEPENDENT_AMBULATORY_CARE_PROVIDER_SITE_OTHER): Payer: BLUE CROSS/BLUE SHIELD | Admitting: Family Medicine

## 2015-02-09 ENCOUNTER — Encounter: Payer: Self-pay | Admitting: Family Medicine

## 2015-02-09 VITALS — BP 137/89 | HR 70 | Temp 98.7°F | Resp 16 | Wt 243.0 lb

## 2015-02-09 DIAGNOSIS — F419 Anxiety disorder, unspecified: Secondary | ICD-10-CM | POA: Insufficient documentation

## 2015-02-09 DIAGNOSIS — F329 Major depressive disorder, single episode, unspecified: Secondary | ICD-10-CM | POA: Insufficient documentation

## 2015-02-09 DIAGNOSIS — I2510 Atherosclerotic heart disease of native coronary artery without angina pectoris: Secondary | ICD-10-CM

## 2015-02-09 DIAGNOSIS — E785 Hyperlipidemia, unspecified: Secondary | ICD-10-CM

## 2015-02-09 DIAGNOSIS — F32A Depression, unspecified: Secondary | ICD-10-CM | POA: Insufficient documentation

## 2015-02-09 DIAGNOSIS — I10 Essential (primary) hypertension: Secondary | ICD-10-CM

## 2015-02-09 DIAGNOSIS — R739 Hyperglycemia, unspecified: Secondary | ICD-10-CM | POA: Insufficient documentation

## 2015-02-09 DIAGNOSIS — J302 Other seasonal allergic rhinitis: Secondary | ICD-10-CM | POA: Insufficient documentation

## 2015-02-09 DIAGNOSIS — F411 Generalized anxiety disorder: Secondary | ICD-10-CM | POA: Insufficient documentation

## 2015-02-09 DIAGNOSIS — M1990 Unspecified osteoarthritis, unspecified site: Secondary | ICD-10-CM | POA: Insufficient documentation

## 2015-02-09 DIAGNOSIS — D171 Benign lipomatous neoplasm of skin and subcutaneous tissue of trunk: Secondary | ICD-10-CM

## 2015-02-09 DIAGNOSIS — E7800 Pure hypercholesterolemia, unspecified: Secondary | ICD-10-CM | POA: Insufficient documentation

## 2015-02-09 DIAGNOSIS — R7309 Other abnormal glucose: Secondary | ICD-10-CM

## 2015-02-09 DIAGNOSIS — Z87898 Personal history of other specified conditions: Secondary | ICD-10-CM | POA: Insufficient documentation

## 2015-02-09 DIAGNOSIS — Z8619 Personal history of other infectious and parasitic diseases: Secondary | ICD-10-CM | POA: Insufficient documentation

## 2015-02-09 DIAGNOSIS — K219 Gastro-esophageal reflux disease without esophagitis: Secondary | ICD-10-CM | POA: Diagnosis not present

## 2015-02-09 DIAGNOSIS — J329 Chronic sinusitis, unspecified: Secondary | ICD-10-CM | POA: Insufficient documentation

## 2015-02-09 HISTORY — DX: Benign lipomatous neoplasm of skin and subcutaneous tissue of trunk: D17.1

## 2015-02-09 LAB — POCT GLYCOSYLATED HEMOGLOBIN (HGB A1C): Hemoglobin A1C: 6.6

## 2015-02-09 MED ORDER — ESOMEPRAZOLE MAGNESIUM 40 MG PO CPDR: 40.0000 mg | DELAYED_RELEASE_CAPSULE | Freq: Every day | ORAL | Status: DC

## 2015-02-09 NOTE — Progress Notes (Signed)
Name: Robert Greer   MRN: [id number]    DOB: 1955-03-21   Date:02/09/2015       Progress Note  Subjective  Chief Complaint  Chief Complaint  Patient presents with  . Hospitalization Follow-up    heart attack    HPI Here for f/u of MI with angioplasty and 3 stent placement.   He had stents placed by Dr. Fletcher Anon, and follows with Dr. Rockey Situ.  To see him in 2 weeks.   He is feeling well overall except that he c/o fatigue.  Also is having some GERD sx.   Taking OTC Nexium and it helps sx.   No problem-specific assessment & plan notes found for this encounter.   Past Medical History  Diagnosis Date  . Hyperlipidemia   . Anxiety   . Mobitz (type) II atrioventricular block   . Arthritis   . Backache, unspecified   . Depressive disorder, not elsewhere classified   . Hypertension   . Mumps   . Measles     Past Surgical History  Procedure Laterality Date  . Back surgery  2012    x2  . Tonsillectomy and adenoidectomy    . Knee surgery      bilateral  . Anterior fusion lumbar spine    . Total shoulder replacement Right   . Cardiac catheterization N/A 01/10/2015    Procedure: Left Heart Cath and Coronary Angiography;  Surgeon: Wellington Hampshire, MD;  Location: [facility];  Service: Cardiovascular;  Laterality: N/A;  . Cardiac catheterization N/A 01/10/2015    Procedure: Coronary Stent Intervention;  Surgeon: Wellington Hampshire, MD;  Location: [facility];  Service: Cardiovascular;  Laterality: N/A;    History reviewed. No pertinent family history.  Social History   Social History  . Marital Status: Married    Spouse Name: N/A  . Number of Children: N/A  . Years of Education: N/A   Occupational History  . Not on file.   Social History Main Topics  . Smoking status: Never Smoker   . Smokeless tobacco: Current User    Types: Snuff  . Alcohol Use: 1.2 - 1.8 oz/week    2-3 Cans of beer per week     Comment: ocassionally  . Drug Use: No  . Sexual Activity:  Not on file   Other Topics Concern  . Not on file   Social History Narrative     Current outpatient prescriptions:  .  aspirin 81 MG chewable tablet, Chew 1 tablet (81 mg total) by mouth daily., Disp: 30 tablet, Rfl: 3 .  atorvastatin (LIPITOR) 20 MG tablet, , Disp: , Rfl: 2 .  cetirizine (ZYRTEC) 10 MG tablet, Take 10 mg by mouth as needed for allergies. Reported on 02/09/2015, Disp: , Rfl:  .  DULoxetine (CYMBALTA) 20 MG capsule, Take 40 mg by mouth daily. , Disp: , Rfl: 0 .  ezetimibe (ZETIA) 10 MG tablet, Take 1 tablet (10 mg total) by mouth daily., Disp: 30 tablet, Rfl: 11 .  meloxicam (MOBIC) 7.5 MG tablet, Take 15 mg by mouth daily. , Disp: , Rfl:  .  Multiple Vitamin (MULTIVITAMIN WITH MINERALS) TABS tablet, Take 1 tablet by mouth daily., Disp: , Rfl:  .  nitroGLYCERIN (NITROSTAT) 0.4 MG SL tablet, Place 1 tablet (0.4 mg total) under the tongue every 5 (five) minutes as needed for chest pain., Disp: 25 tablet, Rfl: 3 .  oxyCODONE-acetaminophen (PERCOCET) 10-325 MG tablet, Take 1 tablet by mouth every 4 (four)  hours as needed for pain., Disp: , Rfl:  .  ticagrelor (BRILINTA) 90 MG TABS tablet, Take 1 tablet (90 mg total) by mouth 2 (two) times daily., Disp: 60 tablet, Rfl: 6  Not on File   Review of Systems  Constitutional: Positive for malaise/fatigue. Negative for fever, chills and weight loss.  HENT: Negative for hearing loss.   Eyes: Negative for blurred vision and double vision.  Respiratory: Negative for cough, shortness of breath and wheezing.   Cardiovascular: Negative for chest pain, palpitations and leg swelling.  Gastrointestinal: Positive for heartburn. Negative for nausea, vomiting, abdominal pain, diarrhea, constipation and blood in stool.  Genitourinary: Negative for dysuria, urgency and frequency.  Musculoskeletal: Positive for back pain. Negative for myalgias and joint pain.  Skin: Negative for rash.  Neurological: Negative for dizziness, tremors, weakness and  headaches.  Psychiatric/Behavioral: Negative for depression.      Objective  Filed Vitals:   02/09/15 1528  BP: 137/89  Pulse: 70  Temp: 98.7 F (37.1 C)  TempSrc: Oral  Resp: 16  Weight: 243 lb (110.224 kg)    Physical Exam  Constitutional: He is oriented to person, place, and time and well-developed, well-nourished, and in no distress. No distress.  HENT:  Head: Normocephalic and atraumatic.  Eyes: Conjunctivae and EOM are normal. Pupils are equal, round, and reactive to light. No scleral icterus.  Neck: Normal range of motion. Neck supple. Carotid bruit is not present. No thyromegaly present.  Cardiovascular: Normal rate, regular rhythm and normal heart sounds.  Exam reveals no gallop and no friction rub.   No murmur heard. Pulmonary/Chest: Effort normal and breath sounds normal. No respiratory distress. He has no wheezes. He has no rales.  Abdominal: Soft. Bowel sounds are normal. He exhibits no distension. There is no tenderness. There is no rebound.  Musculoskeletal: He exhibits no edema.  Lymphadenopathy:    He has no cervical adenopathy.  Neurological: He is alert and oriented to person, place, and time.  Vitals reviewed.      Recent Results (from the past 2160 hour(s))  Basic metabolic panel     Status: Abnormal   Collection Time: 01/07/15  2:54 PM  Result Value Ref Range   Sodium 139 135 - 145 mmol/L   Potassium 4.5 3.5 - 5.1 mmol/L   Chloride 103 101 - 111 mmol/L   CO2 28 22 - 32 mmol/L   Glucose, Bld 101 (H) 65 - 99 mg/dL   BUN 17 6 - 20 mg/dL   Creatinine, Ser 1.03 0.61 - 1.24 mg/dL   Calcium 9.1 8.9 - 10.3 mg/dL   GFR calc non Af Amer >60 >60 mL/min   GFR calc Af Amer >60 >60 mL/min    Comment: (NOTE) The eGFR has been calculated using the CKD EPI equation. This calculation has not been validated in all clinical situations. eGFR's persistently <60 mL/min signify possible Chronic Kidney Disease.    Anion gap 8 5 - 15  CBC     Status: None    Collection Time: 01/07/15  2:54 PM  Result Value Ref Range   WBC 8.5 3.8 - 10.6 K/uL   RBC 5.02 4.40 - 5.90 MIL/uL   Hemoglobin 15.3 13.0 - 18.0 g/dL   HCT 46.1 40.0 - 52.0 %   MCV 91.8 80.0 - 100.0 fL   MCH 30.4 26.0 - 34.0 pg   MCHC 33.1 32.0 - 36.0 g/dL   RDW 12.8 11.5 - 14.5 %   Platelets 264 150 - 440  K/uL  Troponin I     Status: None   Collection Time: 01/07/15  2:54 PM  Result Value Ref Range   Troponin I 0.03 <0.031 ng/mL    Comment:        NO INDICATION OF MYOCARDIAL INJURY.   APTT     Status: None   Collection Time: 01/07/15  2:54 PM  Result Value Ref Range   aPTT 30 24 - 36 seconds  Troponin I     Status: Abnormal   Collection Time: 01/07/15  6:00 PM  Result Value Ref Range   Troponin I 0.63 (H) <0.031 ng/mL    Comment: READ BACK AND VERIFIED WITH EMMA HUNTER AT 1828 01/07/15.PM[facility]        POSSIBLE MYOCARDIAL ISCHEMIA. SERIAL TESTING RECOMMENDED.   TSH     Status: Abnormal   Collection Time: 01/07/15  8:54 PM  Result Value Ref Range   TSH 7.329 (H) 0.350 - 4.500 uIU/mL  Troponin I     Status: Abnormal   Collection Time: 01/07/15  8:54 PM  Result Value Ref Range   Troponin I 3.16 (H) <0.031 ng/mL    Comment: PREVIOUS RESULT CALLED AT 1828  01/07/15 BY PMH...SDR        POSSIBLE MYOCARDIAL ISCHEMIA. SERIAL TESTING RECOMMENDED.   Hemoglobin A1c     Status: None   Collection Time: 01/07/15  8:54 PM  Result Value Ref Range   Hgb A1c MFr Bld 5.5 4.0 - 6.0 %  Heparin level (unfractionated)     Status: None   Collection Time: 01/08/15  2:25 AM  Result Value Ref Range   Heparin Unfractionated 0.33 0.30 - 0.70 IU/mL    Comment:        IF HEPARIN RESULTS ARE BELOW EXPECTED VALUES, AND PATIENT DOSAGE HAS BEEN CONFIRMED, SUGGEST FOLLOW UP TESTING OF ANTITHROMBIN III LEVELS.   Lipid panel     Status: None   Collection Time: 01/08/15  2:25 AM  Result Value Ref Range   Cholesterol 149 0 - 200 mg/dL   Triglycerides 114 <150 mg/dL   HDL 48 >40 mg/dL   Total  CHOL/HDL Ratio 3.1 RATIO   VLDL 23 0 - 40 mg/dL   LDL Cholesterol 78 0 - 99 mg/dL    Comment:        Total Cholesterol/HDL:CHD Risk Coronary Heart Disease Risk Table                     Men   Women  1/2 Average Risk   3.4   3.3  Average Risk       5.0   4.4  2 X Average Risk   9.6   7.1  3 X Average Risk  23.4   11.0        Use the calculated Patient Ratio above and the CHD Risk Table to determine the patient's CHD Risk.        ATP III CLASSIFICATION (LDL):  <100     mg/dL   Optimal  100-129  mg/dL   Near or Above                    Optimal  130-159  mg/dL   Borderline  160-189  mg/dL   High  >190     mg/dL   Very High   Troponin I     Status: Abnormal   Collection Time: 01/08/15  2:25 AM  Result Value Ref Range   Troponin I 10.08 (  H) <0.031 ng/mL    Comment: PREVIOUS RESULT CALLED AT 1828 01/07/15.PMH        POSSIBLE MYOCARDIAL ISCHEMIA. SERIAL TESTING RECOMMENDED.   Basic metabolic panel     Status: Abnormal   Collection Time: 01/08/15  2:25 AM  Result Value Ref Range   Sodium 140 135 - 145 mmol/L   Potassium 4.4 3.5 - 5.1 mmol/L   Chloride 109 101 - 111 mmol/L   CO2 24 22 - 32 mmol/L   Glucose, Bld 102 (H) 65 - 99 mg/dL   BUN 17 6 - 20 mg/dL   Creatinine, Ser 1.00 0.61 - 1.24 mg/dL   Calcium 8.5 (L) 8.9 - 10.3 mg/dL   GFR calc non Af Amer >60 >60 mL/min   GFR calc Af Amer >60 >60 mL/min    Comment: (NOTE) The eGFR has been calculated using the CKD EPI equation. This calculation has not been validated in all clinical situations. eGFR's persistently <60 mL/min signify possible Chronic Kidney Disease.    Anion gap 7 5 - 15  CBC     Status: None   Collection Time: 01/08/15  2:25 AM  Result Value Ref Range   WBC 8.8 3.8 - 10.6 K/uL   RBC 4.66 4.40 - 5.90 MIL/uL   Hemoglobin 14.2 13.0 - 18.0 g/dL   HCT 43.0 40.0 - 52.0 %   MCV 92.4 80.0 - 100.0 fL   MCH 30.4 26.0 - 34.0 pg   MCHC 32.9 32.0 - 36.0 g/dL   RDW 12.8 11.5 - 14.5 %   Platelets 224 150 - 440  K/uL  Troponin I     Status: Abnormal   Collection Time: 01/08/15  9:24 AM  Result Value Ref Range   Troponin I 9.73 (H) <0.031 ng/mL    Comment: READ BACK AND VERIFIED WITH DONNISHA ROBINSON @ 7741 ON 01/08/2015 BY [facility]        POSSIBLE MYOCARDIAL ISCHEMIA. SERIAL TESTING RECOMMENDED.   Protime-INR     Status: None   Collection Time: 01/08/15  9:44 AM  Result Value Ref Range   Prothrombin Time 13.5 11.4 - 15.0 seconds   INR 1.01   Heparin level (unfractionated)     Status: Abnormal   Collection Time: 01/08/15  9:44 AM  Result Value Ref Range   Heparin Unfractionated 0.20 (L) 0.30 - 0.70 IU/mL    Comment:        IF HEPARIN RESULTS ARE BELOW EXPECTED VALUES, AND PATIENT DOSAGE HAS BEEN CONFIRMED, SUGGEST FOLLOW UP TESTING OF ANTITHROMBIN III LEVELS.   Troponin I (q 6hr x 3)     Status: Abnormal   Collection Time: 01/08/15  2:37 PM  Result Value Ref Range   Troponin I 9.58 (H) <0.031 ng/mL    Comment: PREVIOUS RESULT CALLED BY [facility] @ 1014 ON 01/08/2015 [facility]        POSSIBLE MYOCARDIAL ISCHEMIA. SERIAL TESTING RECOMMENDED.   Heparin level (unfractionated)     Status: Abnormal   Collection Time: 01/08/15  2:38 PM  Result Value Ref Range   Heparin Unfractionated 0.14 (L) 0.30 - 0.70 IU/mL    Comment:        IF HEPARIN RESULTS ARE BELOW EXPECTED VALUES, AND PATIENT DOSAGE HAS BEEN CONFIRMED, SUGGEST FOLLOW UP TESTING OF ANTITHROMBIN III LEVELS.   Troponin I (q 6hr x 3)     Status: Abnormal   Collection Time: 01/08/15  8:05 PM  Result Value Ref Range   Troponin I 9.69 (H) <0.031 ng/mL  Comment: PREVIOUS RESULT CALLED BY [facility] ON 01/08/2015 @ 1014 CAF        POSSIBLE MYOCARDIAL ISCHEMIA. SERIAL TESTING RECOMMENDED.   Heparin level (unfractionated)     Status: None   Collection Time: 01/08/15  8:06 PM  Result Value Ref Range   Heparin Unfractionated 0.65 0.30 - 0.70 IU/mL    Comment:        IF HEPARIN RESULTS ARE BELOW EXPECTED VALUES, AND PATIENT DOSAGE HAS BEEN  CONFIRMED, SUGGEST FOLLOW UP TESTING OF ANTITHROMBIN III LEVELS.   Troponin I (q 6hr x 3)     Status: Abnormal   Collection Time: 01/09/15  3:41 AM  Result Value Ref Range   Troponin I 9.24 (H) <0.031 ng/mL    Comment: PREVIOUS RESULT CALLED AT 1014 01/08/15.PMH        POSSIBLE MYOCARDIAL ISCHEMIA. SERIAL TESTING RECOMMENDED.   CBC     Status: Abnormal   Collection Time: 01/09/15  3:41 AM  Result Value Ref Range   WBC 13.2 (H) 3.8 - 10.6 K/uL   RBC 4.93 4.40 - 5.90 MIL/uL   Hemoglobin 15.2 13.0 - 18.0 g/dL   HCT 45.4 40.0 - 52.0 %   MCV 92.0 80.0 - 100.0 fL   MCH 30.8 26.0 - 34.0 pg   MCHC 33.5 32.0 - 36.0 g/dL   RDW 12.9 11.5 - 14.5 %   Platelets 231 150 - 440 K/uL  Heparin level (unfractionated)     Status: None   Collection Time: 01/09/15  3:41 AM  Result Value Ref Range   Heparin Unfractionated 0.48 0.30 - 0.70 IU/mL    Comment:        IF HEPARIN RESULTS ARE BELOW EXPECTED VALUES, AND PATIENT DOSAGE HAS BEEN CONFIRMED, SUGGEST FOLLOW UP TESTING OF ANTITHROMBIN III LEVELS.   CBC     Status: Abnormal   Collection Time: 01/10/15  4:35 AM  Result Value Ref Range   WBC 10.9 (H) 3.8 - 10.6 K/uL   RBC 4.78 4.40 - 5.90 MIL/uL   Hemoglobin 15.1 13.0 - 18.0 g/dL   HCT 44.9 40.0 - 52.0 %   MCV 93.9 80.0 - 100.0 fL   MCH 31.5 26.0 - 34.0 pg   MCHC 33.6 32.0 - 36.0 g/dL   RDW 12.9 11.5 - 14.5 %   Platelets 217 150 - 440 K/uL  Heparin level (unfractionated)     Status: None   Collection Time: 01/10/15  4:35 AM  Result Value Ref Range   Heparin Unfractionated 0.40 0.30 - 0.70 IU/mL    Comment:        IF HEPARIN RESULTS ARE BELOW EXPECTED VALUES, AND PATIENT DOSAGE HAS BEEN CONFIRMED, SUGGEST FOLLOW UP TESTING OF ANTITHROMBIN III LEVELS.   CBC     Status: None   Collection Time: 01/11/15  5:07 AM  Result Value Ref Range   WBC 10.5 3.8 - 10.6 K/uL   RBC 4.71 4.40 - 5.90 MIL/uL   Hemoglobin 14.6 13.0 - 18.0 g/dL   HCT 44.0 40.0 - 52.0 %   MCV 93.6 80.0 - 100.0 fL    MCH 31.1 26.0 - 34.0 pg   MCHC 33.3 32.0 - 36.0 g/dL   RDW 13.1 11.5 - 14.5 %   Platelets 249 150 - 440 K/uL  Basic metabolic panel     Status: Abnormal   Collection Time: 01/11/15  5:07 AM  Result Value Ref Range   Sodium 141 135 - 145 mmol/L   Potassium 4.3 3.5 - 5.1 mmol/L  Chloride 109 101 - 111 mmol/L   CO2 28 22 - 32 mmol/L   Glucose, Bld 114 (H) 65 - 99 mg/dL   BUN 12 6 - 20 mg/dL   Creatinine, Ser 1.12 0.61 - 1.24 mg/dL   Calcium 8.9 8.9 - 10.3 mg/dL   GFR calc non Af Amer >60 >60 mL/min   GFR calc Af Amer >60 >60 mL/min    Comment: (NOTE) The eGFR has been calculated using the CKD EPI equation. This calculation has not been validated in all clinical situations. eGFR's persistently <60 mL/min signify possible Chronic Kidney Disease.    Anion gap 4 (L) 5 - 15  POCT HgB A1C     Status: Normal   Collection Time: 02/09/15  4:06 PM  Result Value Ref Range   Hemoglobin A1C 5.6      Assessment & Plan  Problem List Items Addressed This Visit      Cardiovascular and Mediastinum   Hypertension   Relevant Medications   atorvastatin (LIPITOR) 20 MG tablet     Digestive   GERD (gastroesophageal reflux disease)     Other   Hyperlipidemia   Relevant Medications   atorvastatin (LIPITOR) 20 MG tablet   Elevated blood sugar    Other Visit Diagnoses    Type 2 diabetes mellitus without complication, without long-term current use of insulin (HCC)    -  Primary    Relevant Medications    atorvastatin (LIPITOR) 20 MG tablet    Other Relevant Orders    POCT HgB A1C (Completed)       Meds ordered this encounter  Medications  . atorvastatin (LIPITOR) 20 MG tablet    Sig:     Refill:  2

## 2015-02-14 ENCOUNTER — Ambulatory Visit (INDEPENDENT_AMBULATORY_CARE_PROVIDER_SITE_OTHER): Payer: BLUE CROSS/BLUE SHIELD | Admitting: Cardiovascular Disease

## 2015-02-14 ENCOUNTER — Encounter: Payer: Self-pay | Admitting: Cardiovascular Disease

## 2015-02-14 VITALS — BP 120/80 | HR 63 | Ht 72.0 in | Wt 245.0 lb

## 2015-02-14 DIAGNOSIS — I2510 Atherosclerotic heart disease of native coronary artery without angina pectoris: Secondary | ICD-10-CM

## 2015-02-14 DIAGNOSIS — I200 Unstable angina: Secondary | ICD-10-CM

## 2015-02-14 DIAGNOSIS — I470 Re-entry ventricular arrhythmia: Secondary | ICD-10-CM | POA: Diagnosis not present

## 2015-02-14 DIAGNOSIS — R7309 Other abnormal glucose: Secondary | ICD-10-CM

## 2015-02-14 DIAGNOSIS — R079 Chest pain, unspecified: Secondary | ICD-10-CM

## 2015-02-14 DIAGNOSIS — E785 Hyperlipidemia, unspecified: Secondary | ICD-10-CM

## 2015-02-14 DIAGNOSIS — R739 Hyperglycemia, unspecified: Secondary | ICD-10-CM

## 2015-02-14 DIAGNOSIS — R5383 Other fatigue: Secondary | ICD-10-CM | POA: Insufficient documentation

## 2015-02-14 NOTE — Progress Notes (Signed)
Patient ID: Robert Greer, male    DOB: [birth year], 60 y.o.   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  Initially presented for palpitations, possible arrhythmia in August 2016  Previous normal echocardiogram and stress test  On 12/28/2014 he presented to the emergency room with chest discomfort Symptoms had started sometime prior to that, he initially felt symptoms were from heartburn Admitted to the hospital late Friday, cardiac catheterization on Monday  In follow-up today, he reports doing relatively well, is back working on the farm, Denies any significant angina on exertion Occasionally feels a pressure sensation in his chest, presents at rest not with exertion, does not last for long. 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 Luan Pulling and [facility]. Echocardiogram report was not completed, ejection fraction unavailable but estimated at normal, 60% with no wall motion abnormality on stress test  No Known Allergies  Current Outpatient Prescriptions on File Prior to Visit  Medication Sig Dispense Refill  . aspirin 81 MG chewable tablet Chew 1 tablet (81 mg total) by mouth daily. 30 tablet 3  . atorvastatin  (LIPITOR) 20 MG tablet Take 20 mg by mouth daily at 6 PM.   2  . cetirizine (ZYRTEC) 10 MG tablet Take 10 mg by mouth as needed for allergies. Reported on 02/09/2015    . DULoxetine (CYMBALTA) 20 MG capsule Take 40 mg by mouth daily.   0  . esomeprazole (NEXIUM) 40 MG capsule Take 1 capsule (40 mg total) by mouth daily. 30 capsule 12  . ezetimibe (ZETIA) 10 MG tablet Take 1 tablet (10 mg total) by mouth daily. 30 tablet 11  . meloxicam (MOBIC) 7.5 MG tablet Take 15 mg by mouth daily.     . Multiple Vitamin (MULTIVITAMIN WITH MINERALS) TABS tablet Take 1 tablet by mouth daily.    . nitroGLYCERIN (NITROSTAT) 0.4 MG SL tablet Place 1 tablet (0.4 mg total) under the tongue every 5 (five) minutes as needed for chest pain. 25 tablet 3  . oxyCODONE-acetaminophen (PERCOCET) 10-325 MG tablet Take 1 tablet by mouth every 4 (four) hours as needed for pain.    . ticagrelor (BRILINTA) 90 MG TABS tablet Take 1 tablet (90 mg total) by mouth 2 (two) times daily. 60 tablet 6   No current facility-administered medications on file prior to visit.    Past Medical History  Diagnosis Date  . Hyperlipidemia   . Anxiety   . Mobitz (type) II atrioventricular block   . Arthritis   . Backache, unspecified   . Depressive disorder, not elsewhere classified   . Hypertension   . Mumps   . Measles     Past Surgical History  Procedure Laterality Date  . Back surgery  2012    x2  . Tonsillectomy and adenoidectomy    . Knee surgery      bilateral  . Anterior fusion lumbar spine    . Total shoulder replacement Right   . Cardiac catheterization N/A 01/10/2015    Procedure: Left Heart Cath and Coronary Angiography;  Surgeon: Wellington Hampshire, MD;  Location: [place] [facility];  Service: Cardiovascular;  Laterality: N/A;  . Cardiac catheterization N/A 01/10/2015    Procedure: Coronary Stent Intervention;  Surgeon: Wellington Hampshire, MD;  Location: [place] [facility];  Service: Cardiovascular;  Laterality: N/A;     Social History  reports that he has never smoked. His smokeless tobacco use includes Snuff. He reports that he drinks about 1.2 - 1.8 oz of alcohol per week. He reports that he does not use illicit drugs.  Family History Family history is unknown by patient.   Review of Systems  Constitutional: Negative.   Respiratory: Negative.   Cardiovascular: Negative.   Gastrointestinal: Negative.   Musculoskeletal: Positive for arthralgias.  Skin: Negative.   Neurological: Negative.   Psychiatric/Behavioral: Negative.   All other systems reviewed and are negative.   BP 120/80 mmHg  Pulse 63  Ht 6' (1.829 m)  Wt 245 lb (111.131 kg)  BMI 33.22 kg/m2  Physical Exam  Constitutional: He is oriented to person, place, and time. He appears well-developed and well-nourished.  Obese  HENT:  Head: Normocephalic.  Nose: Nose normal.  Mouth/Throat: Oropharynx is clear and moist.  Eyes: Conjunctivae are normal. Pupils are equal, round, and reactive to light.  Neck: Normal range of motion. Neck supple. No JVD present.  Cardiovascular: Normal rate, regular rhythm, S1 normal, S2 normal, normal heart sounds and intact distal pulses.  Exam reveals no gallop and no friction rub.   No murmur heard. Pulmonary/Chest: Effort normal and breath sounds normal. No respiratory distress. He has no wheezes. He has no rales. He exhibits no tenderness.  Abdominal: Soft. Bowel sounds are normal. He exhibits no distension. There is no tenderness.  Musculoskeletal: Normal range of motion. He exhibits no edema or tenderness.  Lymphadenopathy:    He has no cervical adenopathy.  Neurological: He is alert and oriented to person, place, and time. Coordination normal.  Skin: Skin is warm and dry. No rash noted. No erythema.  Psychiatric: He has a normal mood and affect. His behavior is normal. Judgment and thought content normal.      Assessment and Plan   Nursing note and vitals reviewed.

## 2015-02-14 NOTE — Assessment & Plan Note (Signed)
Recent and difficult stent placement to his proximal LAD for long calcified region Occluded mid RCA, collaterals to the distal RCA Recommended a regular exercise program. 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

## 2015-02-14 NOTE — Assessment & Plan Note (Signed)
Etiology unclear, recommended he work on his sleep hygiene He is concerned that the antiplatelet medication could be contributed to his symptoms. This is unlikely, no other medication changes made at this time. Unable to exclude mild depression following recent medical issues.  More than 25 minutes spent discussing his medical issues with him in clinic Greater than 50% was spent in counseling and coordination of care with patient

## 2015-02-14 NOTE — Assessment & Plan Note (Signed)
As detailed above, we have recommended regular walking program, weight loss, low carbohydrate diet

## 2015-02-14 NOTE — Assessment & Plan Note (Signed)
Recommended weight loss for improved cholesterol and diabetes numbers If LDL continues to run greater than 70, could increase Lipitor dosing

## 2015-02-14 NOTE — Patient Instructions (Signed)
You are doing well. No medication changes were made.  Please call us if you have new issues that need to be addressed before your next appt.  Your physician wants you to follow-up in: 6 months.  You will receive a reminder letter in the mail two months in advance. If you don't receive a letter, please call our office to schedule the follow-up appointment.   

## 2015-02-22 ENCOUNTER — Ambulatory Visit: Payer: BLUE CROSS/BLUE SHIELD | Admitting: Nurse Practitioner

## 2015-02-24 ENCOUNTER — Telehealth: Payer: Self-pay

## 2015-02-24 NOTE — Telephone Encounter (Signed)
I returned the patient's call in regards to his question concerning stopping his medications for a colonoscopy. He recently had a DES placed to the mid-LAD in 12/2014 and was noted to have 100% occlusion of the RCA at that time that already had faint collaterals. It was recommended he be on DAPT indefinitely if possible.  He reports his colonoscopy would only be for screening purposes. He has not even scheduled one yet, but knew he was overdue on the test. I informed him most Gastroenterologists would likely prefer for him to stop his DAPT for 1-2 weeks in preparation for the procedure to reduce his risk of bleeding. With his recent stent placement, we would prefer him to be on uninterrupted therapy for a minimum of at least 3-6 months following his procedure. If he developed an acute issue and needed an emergent procedure, that would be different. However, for a screening procedure, we would recommended he wait at least 6 months.  He voiced understanding of this and was in agreement. He appreciated the call.  Signed, Erma Heritage, PA-C 02/24/2015, 1:53 PM Pager: [phone]

## 2015-02-24 NOTE — Telephone Encounter (Signed)
Pt would like to know if he needs to hold any medication before having a colonoscopy. Please call and advise

## 2015-02-28 ENCOUNTER — Telehealth: Payer: Self-pay | Admitting: Family Medicine

## 2015-02-28 ENCOUNTER — Other Ambulatory Visit: Payer: Self-pay

## 2015-02-28 DIAGNOSIS — K219 Gastro-esophageal reflux disease without esophagitis: Secondary | ICD-10-CM

## 2015-02-28 NOTE — Telephone Encounter (Signed)
PT WIFE CALLED REQUESTING  A REFERRAL TO GI DR. AT  [facility]  DR. [place]  DR. FAX # IS  [phone]

## 2015-02-28 NOTE — Telephone Encounter (Signed)
review 

## 2015-02-28 NOTE — Telephone Encounter (Signed)
Please send me reason for referral and let them know I am ordering this.

## 2015-02-28 NOTE — Telephone Encounter (Signed)
Referral OK, but need reason for referral.  After getting reason, go ahead and make the referral.-jh

## 2015-02-28 NOTE — Telephone Encounter (Signed)
Acid Reflux referral ok. Per Dr Luan Pulling ok to take 2 Nexium daily. Sending referral now.[facility]

## 2015-03-08 ENCOUNTER — Telehealth: Payer: Self-pay | Admitting: Family Medicine

## 2015-03-08 DIAGNOSIS — K219 Gastro-esophageal reflux disease without esophagitis: Secondary | ICD-10-CM

## 2015-03-08 NOTE — Telephone Encounter (Signed)
Pt doubled up on his acid reflux medication but it's still not helping.  Can you suggest trying anything else.  Please call [phone]

## 2015-03-09 ENCOUNTER — Telehealth: Payer: Self-pay

## 2015-03-09 MED ORDER — ESOMEPRAZOLE MAGNESIUM 40 MG PO CPDR: 40.0000 mg | DELAYED_RELEASE_CAPSULE | Freq: Two times a day (BID) | ORAL | Status: DC

## 2015-03-09 MED ORDER — RANITIDINE HCL 150 MG PO CAPS: 150.0000 mg | ORAL_CAPSULE | Freq: Every evening | ORAL | Status: DC

## 2015-03-09 NOTE — Telephone Encounter (Signed)
Spoke with patient and Dr. Luan Pulling told patient to take Nexium 40 mg bid.  Patient has been doing that on as well as taking maalox.  He reports that he is having the pain almost 1 hour after he eats.  He does has appointment with GI next week 03/17/15.  He only has 2 pills left of nexium due to increasing dose per Luan Pulling. Patient is requesting a different medication to be sent to pharm.  Please advise.

## 2015-03-09 NOTE — Telephone Encounter (Signed)
Please review this since Dr.Hawkins is out today. Or if you feel it can wait I guess just forward to him. Thanks!Marland Kitchen

## 2015-03-09 NOTE — Telephone Encounter (Signed)
Pt states he is having acid reflux, wants to know if  Dr. Rockey Situ can call in a rx for him. Pt states that his PCP is not in today. Please call.

## 2015-03-09 NOTE — Telephone Encounter (Signed)
I have sent over a refill of Nexium to his pharmacy with BID instructions. Hopefully insurance will cover it. I also sent over Ranitidine to help with breakthrough symptoms. Take at bedtime. Hopefully this will help until he can see GI next week. Please advise patient. Thanks! AK

## 2015-03-09 NOTE — Telephone Encounter (Signed)
Would try over-the-counter omeprazole 20 mg daily If cheaper we can send in a prescription

## 2015-03-09 NOTE — Telephone Encounter (Signed)
Patient notified. AM 

## 2015-03-10 NOTE — Telephone Encounter (Signed)
Spoke w/ pt.  Dr. Luan Pulling has already sent him something in for his sx.  He will call back if we can be of further assistance.

## 2015-04-11 ENCOUNTER — Ambulatory Visit: Payer: BLUE CROSS/BLUE SHIELD | Admitting: Family Medicine

## 2015-05-23 ENCOUNTER — Other Ambulatory Visit: Payer: Self-pay | Admitting: Family Medicine

## 2015-06-27 ENCOUNTER — Telehealth: Payer: Self-pay | Admitting: Cardiovascular Disease

## 2015-06-27 NOTE — Telephone Encounter (Signed)
Spoke w/ pt.  Advised him that we will need more BP readings, ideally at home instead of stressful setting.  He will obtain a BP monitor and call the office w/ at least 5 readings if they are elevated.  He will also call back to sched 6 mo f/u in July.

## 2015-06-27 NOTE — Telephone Encounter (Signed)
Pt wife called state pt went to neurologist and his BP was elevated. Pt c/o BP issue: STAT if pt c/o blurred vision, one-sided weakness or slurred speech  1. What are your last 5 BP readings? At his neurologist 140/92. Doesn't have any more readings. STates the last several times he was at the [facility] office, the bottom # was in the 90s  2. Are you having any other symptoms (ex. Dizziness, headache, blurred vision, passed out)? no  3. What is your BP issue? elevated

## 2015-08-10 ENCOUNTER — Ambulatory Visit (INDEPENDENT_AMBULATORY_CARE_PROVIDER_SITE_OTHER): Payer: BLUE CROSS/BLUE SHIELD | Admitting: Cardiovascular Disease

## 2015-08-10 ENCOUNTER — Encounter: Payer: Self-pay | Admitting: Cardiovascular Disease

## 2015-08-10 DIAGNOSIS — E785 Hyperlipidemia, unspecified: Secondary | ICD-10-CM

## 2015-08-10 DIAGNOSIS — I10 Essential (primary) hypertension: Secondary | ICD-10-CM | POA: Diagnosis not present

## 2015-08-10 DIAGNOSIS — E7800 Pure hypercholesterolemia, unspecified: Secondary | ICD-10-CM | POA: Diagnosis not present

## 2015-08-10 DIAGNOSIS — I214 Non-ST elevation (NSTEMI) myocardial infarction: Secondary | ICD-10-CM | POA: Diagnosis not present

## 2015-08-10 MED ORDER — EZETIMIBE 10 MG PO TABS: 10.0000 mg | ORAL_TABLET | Freq: Every day | ORAL | Status: DC
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
## 2015-08-10 NOTE — Patient Instructions (Signed)
Medication Instructions:   Please restart zetia one a [place] for cholesterol  Labwork:  No new labs  Testing/Procedures:  No new testing   Follow-Up: It was a pleasure seeing you in the office today. Please call us if you have new issues that need to be addressed before your next appt.  [phone]  Your physician wants you to follow-up in: 12 months.  You will receive a reminder letter in the mail two months in advance. If you don't receive a letter, please call our office to schedule the follow-up appointment.  If you need a refill on your cardiac medications before your next appointment, please call your pharmacy.

## 2015-08-30 ENCOUNTER — Other Ambulatory Visit: Payer: Self-pay | Admitting: Cardiovascular Disease

## 2015-09-15 ENCOUNTER — Encounter: Payer: Self-pay | Admitting: Family Medicine

## 2015-09-15 ENCOUNTER — Ambulatory Visit (INDEPENDENT_AMBULATORY_CARE_PROVIDER_SITE_OTHER): Payer: BLUE CROSS/BLUE SHIELD | Admitting: Family Medicine

## 2015-09-15 VITALS — BP 144/85 | HR 70 | Temp 98.8°F | Resp 16 | Ht 72.0 in | Wt 238.0 lb

## 2015-09-15 DIAGNOSIS — L0211 Cutaneous abscess of neck: Secondary | ICD-10-CM

## 2015-09-15 MED ORDER — DOXYCYCLINE HYCLATE 100 MG PO TABS: 100.0000 mg | ORAL_TABLET | Freq: Two times a day (BID) | ORAL | 0 refills | Status: DC

## 2015-09-15 NOTE — Progress Notes (Signed)
Subjective:    Patient ID: Robert Greer, male    DOB: Oct 20, 1955, 60 y.o.   MRN: [id number]  HPI: Robert Greer is a 60 y.o. male presenting on 09/15/2015 for Cyst (more on Left side frontal neck not painful onset week )   HPI  Pt presents of lump on the side of the neck. Came up 3 days ago. Thought it was a pimple. It drained blood and pus yesterday. He does feel it is increasing size. No fevers. Non-tender. Just aggravating. Superficial. No trouble breathing.  Pt is on Brilinta s/p cardiac cath.   Past Medical History:  Diagnosis Date  . Anxiety   . Arthritis   . Backache, unspecified   . Depressive disorder, not elsewhere classified   . Hyperlipidemia   . Hypertension   . Measles   . Mobitz (type) II atrioventricular block   . Mumps     Current Outpatient Prescriptions on File Prior to Visit  Medication Sig  . aspirin 81 MG chewable tablet Chew 1 tablet (81 mg total) by mouth daily.  Marland Kitchen atorvastatin (LIPITOR) 20 MG tablet Take 20 mg by mouth daily at 6 PM.   . BRILINTA 90 MG TABS tablet Take 1 tablet (90 mg total) by mouth 2 (two) times daily.  . cetirizine (ZYRTEC) 10 MG tablet Take 10 mg by mouth as needed for allergies. Reported on 02/09/2015  . DULoxetine (CYMBALTA) 20 MG capsule Take 40 mg by mouth daily.   Marland Kitchen esomeprazole (NEXIUM) 40 MG capsule Take 1 capsule (40 mg total) by mouth 2 (two) times daily before a meal. (Patient taking differently: Take 40 mg by mouth daily. )  . ezetimibe (ZETIA) 10 MG tablet Take 1 tablet (10 mg total) by mouth daily.  . meloxicam (MOBIC) 7.5 MG tablet Take 15 mg by mouth daily.   . Multiple Vitamin (MULTIVITAMIN WITH MINERALS) TABS tablet Take 1 tablet by mouth daily.  . nitroGLYCERIN (NITROSTAT) 0.4 MG SL tablet Place 1 tablet (0.4 mg total) under the tongue every 5 (five) minutes as needed for chest pain.  Marland Kitchen oxyCODONE-acetaminophen (PERCOCET) 10-325 MG tablet Take 1 tablet by mouth every 4 (four) hours as needed for pain.   No current  facility-administered medications on file prior to visit.     Review of Systems  Constitutional: Negative for chills and fever.  Respiratory: Negative for chest tightness, shortness of breath and wheezing.   Cardiovascular: Negative for chest pain, palpitations and leg swelling.  Skin: Positive for wound. Negative for color change.   Per HPI unless specifically indicated above     Objective:    BP (!) 144/85 (BP Location: Left Arm, Patient Position: Sitting, Cuff Size: Normal)   Pulse 70   Temp 98.8 F (37.1 C) (Oral)   Resp 16   Ht 6' (1.829 m)   Wt 238 lb (108 kg)   BMI 32.28 kg/m   Wt Readings from Last 3 Encounters:  09/15/15 238 lb (108 kg)  08/10/15 236 lb 12 oz (107.4 kg)  02/14/15 245 lb (111.1 kg)    Physical Exam  Neck: Normal range of motion. Neck supple. No tracheal tenderness present. No tracheal deviation present.    Skin: Lesion noted. There is erythema.  Erythematous nodule. No drainage. Non-tender. Freely mobile. About 1cm in diameter.    Results for orders placed or performed in visit on 02/09/15  POCT HgB A1C  Result Value Ref Range   Hemoglobin A1C 6.6  Assessment & Plan:   Problem List Items Addressed This Visit    None    Visit Diagnoses    Abscess, neck    -  Primary   Will not lance 2/2 positioning and anti-coagulation. Treat with doxy BID. Warm compresses. RTC if not improving.    Relevant Medications   doxycycline (VIBRA-TABS) 100 MG tablet      Meds ordered this encounter  Medications  . colchicine 0.6 MG tablet    Sig: Take 0.6 mg by mouth.  . doxycycline (VIBRA-TABS) 100 MG tablet    Sig: Take 1 tablet (100 mg total) by mouth 2 (two) times daily.    Dispense:  20 tablet    Refill:  0    Order Specific Question:   Supervising Provider    Answer:   Arlis Porta [id number]      Follow up plan: Return in about 2 weeks (around 09/29/2015).

## 2015-09-15 NOTE — Patient Instructions (Addendum)

## 2016-01-19 ENCOUNTER — Other Ambulatory Visit: Payer: Self-pay | Admitting: Cardiovascular Disease

## 2016-03-10 ENCOUNTER — Other Ambulatory Visit: Payer: Self-pay | Admitting: Cardiovascular Disease

## 2016-03-12 ENCOUNTER — Other Ambulatory Visit: Payer: Self-pay

## 2016-03-12 MED ORDER — TICAGRELOR 90 MG PO TABS: 90.0000 mg | ORAL_TABLET | Freq: Two times a day (BID) | ORAL | 3 refills | Status: DC

## 2016-03-12 NOTE — Telephone Encounter (Signed)
Refill sent for Brilinta 90 mg  

## 2016-04-17 DIAGNOSIS — M47816 Spondylosis without myelopathy or radiculopathy, lumbar region: Secondary | ICD-10-CM | POA: Diagnosis not present

## 2016-04-17 DIAGNOSIS — G8929 Other chronic pain: Secondary | ICD-10-CM | POA: Diagnosis not present

## 2016-08-27 DIAGNOSIS — M47816 Spondylosis without myelopathy or radiculopathy, lumbar region: Secondary | ICD-10-CM | POA: Diagnosis not present

## 2016-08-27 DIAGNOSIS — G8929 Other chronic pain: Secondary | ICD-10-CM | POA: Diagnosis not present

## 2016-09-09 DIAGNOSIS — I25118 Atherosclerotic heart disease of native coronary artery with other forms of angina pectoris: Secondary | ICD-10-CM | POA: Insufficient documentation

## 2016-09-09 NOTE — Progress Notes (Signed)
Patient ID: Robert Greer, male   DOB: August 24, 1955, 61 y.o.   MRN: [id number] Cardiology Office Note  Date:  09/10/2016   ID:  Robert Greer, DOB 1955-05-26, MRN [id number]  PCP:  Arlis Porta., MD   Chief Complaint  Patient presents with  . other    12 month follow up. Pt. c/o BP being elevated. Meds reviewed by the pt. verbally.     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 Luan Pulling and [facility]. Echocardiogram report was not completed, ejection fraction unavailable but estimated at normal, 60% with no wall motion abnormality on stress test  PMH:   has a past medical  history of Anxiety; Arthritis; Backache, unspecified; Depressive disorder, not elsewhere classified; Hyperlipidemia; Hypertension; Measles; Mobitz (type) II atrioventricular block; and Mumps.  PSH:    Past Surgical History:  Procedure Laterality Date  . ANTERIOR FUSION LUMBAR SPINE    . BACK SURGERY  2012   x2  . CARDIAC CATHETERIZATION N/A 01/10/2015   Procedure: Left Heart Cath and Coronary Angiography;  Surgeon: Robert Hampshire, MD;  Location: [place] [facility];  Service: Cardiovascular;  Laterality: N/A;  . CARDIAC CATHETERIZATION N/A 01/10/2015   Procedure: Coronary Stent Intervention;  Surgeon: Robert Hampshire, MD;  Location: [place] [facility];  Service: Cardiovascular;  Laterality: N/A;  . KNEE SURGERY     bilateral  . TONSILLECTOMY AND ADENOIDECTOMY    . TOTAL SHOULDER REPLACEMENT Right     Current Outpatient Prescriptions  Medication Sig Dispense Refill  . allopurinol (ZYLOPRIM) 100 MG tablet Take 100 mg by mouth daily.    Marland Kitchen aspirin 81 MG chewable tablet Chew 1 tablet (81 mg total) by mouth daily. 30 tablet 3  . atorvastatin (LIPITOR) 20 MG tablet Take 20 mg by mouth daily at 6 PM.   2  . atorvastatin (LIPITOR) 20 MG tablet Take 1 tablet (20 mg total) by mouth daily. 90 tablet 3  . cetirizine (ZYRTEC) 10 MG tablet Take 10 mg by mouth as needed for allergies. Reported on 02/09/2015    . colchicine 0.6 MG tablet Take 0.6 mg by mouth.    . doxycycline (VIBRA-TABS) 100 MG  tablet Take 1 tablet (100 mg total) by mouth 2 (two) times daily. 20 tablet 0  . DULoxetine (CYMBALTA) 20 MG capsule Take 40 mg by mouth daily.   0  . esomeprazole (NEXIUM) 40 MG capsule Take 1 capsule (40 mg total) by mouth 2 (two) times daily before a meal. (Patient taking differently: Take 40 mg by mouth daily. ) 60 capsule 12  . ezetimibe (ZETIA) 10 MG tablet Take 1 tablet (10 mg total) by mouth daily. 30 tablet 11  . meloxicam (MOBIC) 7.5 MG tablet Take 15 mg by mouth daily.     . Multiple Vitamin  (MULTIVITAMIN WITH MINERALS) TABS tablet Take 1 tablet by mouth daily.    . nitroGLYCERIN (NITROSTAT) 0.4 MG SL tablet Place 1 tablet (0.4 mg total) under the tongue every 5 (five) minutes as needed for chest pain. 25 tablet 3  . oxyCODONE-acetaminophen (PERCOCET) 10-325 MG tablet Take 1 tablet by mouth every 4 (four) hours as needed for pain.    . ticagrelor (BRILINTA) 90 MG TABS tablet Take 1 tablet (90 mg total) by mouth 2 (two) times daily. 60 tablet 3   No current facility-administered medications for this visit.      Allergies:   Patient has no known allergies.   Social History:  The patient  reports that he has never smoked. His smokeless tobacco use includes Snuff. He reports that he drinks about 1.2 - 1.8 oz of alcohol per week . He reports that he does not use drugs.   Family History:   Family history is unknown by patient.    Review of Systems: Review of Systems  Constitutional: Negative.   Respiratory: Negative.   Cardiovascular: Negative.   Gastrointestinal: Negative.   Musculoskeletal: Negative.   Neurological: Negative.   Psychiatric/Behavioral: Negative.   All other systems reviewed and are negative.    PHYSICAL EXAM: VS:  BP 130/88 (BP Location: Left Arm, Patient Position: Sitting, Cuff Size: Normal)   Pulse 87   Ht 6' (1.829 m)   Wt 249 lb (112.9 kg)   BMI 33.77 kg/m  , BMI Body mass index is 33.77 kg/m. GEN: Well nourished, well developed, in no acute distress, obese  HEENT: normal  Neck: no JVD, carotid bruits, or masses Cardiac: RRR; no murmurs, rubs, or gallops,no edema  Respiratory:  clear to auscultation bilaterally, normal work of breathing GI: soft, nontender, nondistended, + BS MS: no deformity or atrophy  Skin: warm and dry, no rash Neuro:  Strength and sensation are intact Psych: euthymic mood, full affect    Recent Labs: No results found for requested labs within last 8760 hours.    Lipid Panel Lab Results  Component Value Date    CHOL 149 01/08/2015   HDL 48 01/08/2015   LDLCALC 78 01/08/2015   TRIG 114 01/08/2015      Wt Readings from Last 3 Encounters:  09/10/16 249 lb (112.9 kg)  09/15/15 238 lb (108 kg)  08/10/15 236 lb 12 oz (107.4 kg)       ASSESSMENT AND PLAN:   Essential hypertension - Plan: EKG 12-Lead Blood pressure is well controlled on today's visit. No changes made to the medications. Suggested he monitor diastolic pressure  Hypercholesteremia Cholesterol is at goal on the current lipid regimen. No changes to the medications were made.  NSTEMI (non-ST elevated myocardial infarction) ([place]) - Plan: EKG 12-Lead Prior stent to the LAD, occluded mid RCA with collaterals  Denies any symptoms concerning for angina,  No further testing  at this time  Nonsmoker, no diabetes   Hyperlipidemia We have requested labs from primary care Suggested he stay on zetia and statin Goal LDL <70   Total encounter time more than 25 minutes  Greater than 50% was spent in counseling and coordination of care with the patient   Disposition:   F/U  12 months   Orders Placed This Encounter  Procedures  . EKG 12-Lead     Signed, Esmond Plants, M.D., Ph.D. 09/10/2016  [place], [place]

## 2016-09-10 ENCOUNTER — Ambulatory Visit (INDEPENDENT_AMBULATORY_CARE_PROVIDER_SITE_OTHER): Payer: BLUE CROSS/BLUE SHIELD | Admitting: Cardiovascular Disease

## 2016-09-10 ENCOUNTER — Encounter: Payer: Self-pay | Admitting: Cardiovascular Disease

## 2016-09-10 VITALS — BP 130/88 | HR 87 | Ht 72.0 in | Wt 249.0 lb

## 2016-09-10 DIAGNOSIS — I209 Angina pectoris, unspecified: Secondary | ICD-10-CM

## 2016-09-10 DIAGNOSIS — E782 Mixed hyperlipidemia: Secondary | ICD-10-CM

## 2016-09-10 DIAGNOSIS — I25118 Atherosclerotic heart disease of native coronary artery with other forms of angina pectoris: Secondary | ICD-10-CM | POA: Diagnosis not present

## 2016-09-10 MED ORDER — CLOPIDOGREL BISULFATE 75 MG PO TABS: 75.0000 mg | ORAL_TABLET | Freq: Every day | ORAL | 3 refills | Status: DC

## 2016-09-10 NOTE — Patient Instructions (Addendum)
Medication Instructions:   When you run out of brilinta, Start plavix one a [place] with aspirin  Labwork:  No new labs needed  Testing/Procedures:  No further testing at this time   Follow-Up: It was a pleasure seeing you in the office today. Please call us if you have new issues that need to be addressed before your next appt.  [phone]  Your physician wants you to follow-up in: 12 months.  You will receive a reminder letter in the mail two months in advance. If you don't receive a letter, please call our office to schedule the follow-up appointment.  If you need a refill on your cardiac medications before your next appointment, please call your pharmacy.

## 2016-09-11 ENCOUNTER — Telehealth: Payer: Self-pay | Admitting: Cardiovascular Disease

## 2016-09-11 NOTE — Telephone Encounter (Signed)
Left message for pt to call back  °

## 2016-09-11 NOTE — Telephone Encounter (Signed)
Spoke w/ pt.  He is upset that he has to go through the front desk and cannot call me directly; he requests my direct #. Advised pt that the front desk is doing their job and collecting as much info as possible so that I can be prepared and call him back w/ an answer to his question instead of calling him back to see what he wants.  He does not have a medical question.   Advised him that I will need to document this call in his chart; he states to "just put something about my medicine".  He was calling to let me know that I can call him anytime I want free eggs and he will bring them by the office.  Thanked pt, but let him know that will not be necessary. Inquired as to where he had his most recent labs, as I called Dr. Christella Scheuermann office and they report pt has not been seen in their office in some time. He states that he is retired from the [employer] occupational and sees the "city doctor" for most of his medical needs. Advised him that I do not have contact info for this doctor and have been unable to obtain his most recent cholesterol level. Pt then invited me (and my husband) to a fundraiser this weekend and discussed the recent firefighter death in [place].  Advised pt that I am in clinic and if he has no further medical questions, I will need to go.  The phone cut off, so I am unsure if he hung up or if the phone call was disconnected.

## 2016-09-11 NOTE — Telephone Encounter (Signed)
Pt has a question regarding his blood thinner. Please call.

## 2016-09-11 NOTE — Telephone Encounter (Signed)
Patient states that he wants to speak specifically with [facility] Will route message over to her per his request.

## 2016-10-02 ENCOUNTER — Other Ambulatory Visit: Payer: Self-pay | Admitting: Cardiovascular Disease

## 2016-10-29 ENCOUNTER — Telehealth: Payer: Self-pay | Admitting: Cardiovascular Disease

## 2016-10-29 DIAGNOSIS — I10 Essential (primary) hypertension: Secondary | ICD-10-CM

## 2016-10-29 DIAGNOSIS — Z79899 Other long term (current) drug therapy: Secondary | ICD-10-CM

## 2016-10-29 NOTE — Telephone Encounter (Signed)
Pt c/o BP issue: STAT if pt c/o blurred vision, one-sided weakness or slurred speech  1. What are your last 5 BP readings?   10/29/16 158/100 10/28/16 150/105 10/27/16 155/105 10/26/16 158/108  2. Are you having any other symptoms (ex. Dizziness, headache, blurred vision, passed out)?   No, states not really  3. What is your BP issue?  It is running high. Saw neurologist on Friday and his BP was high, was advised to contact us and get some advise   He states normal around 140/88

## 2016-10-29 NOTE — Telephone Encounter (Signed)
Patient has been experiencing elevated BP.  10/29/16 158/100 10/28/16 150/105 10/27/16 155/105 10/26/16 158/108  He does not have any prior readings because he did not have a BP cuff until 10/26/16. When he was at the neurosurgeon last week, they advised him to contact us for high BP. Patient denies headache, blurred vision, dizziness, or swelling.  Patient is not currently on BP medication and states he's never been on it before but thought it was good for his BP to be this high for so long. Advised patient to continue to monitor BP and HR. I would route to provided for advice on BP management. Routing to Ignacia Bayley, NP for advice as Dr. Rockey Situ in out of office.

## 2016-10-30 MED ORDER — LOSARTAN POTASSIUM 25 MG PO TABS: 25.0000 mg | ORAL_TABLET | Freq: Every day | ORAL | 3 refills | Status: DC

## 2016-10-30 NOTE — Telephone Encounter (Signed)
He does not appear to be on any BP meds @ home.  Please add losartan 25 mg daily and have him come for a f/u bmet in 1 wk.

## 2016-11-02 IMAGING — CR DG CHEST 2V
2 series · 2 of 2 positions shown · non-contrast
Comparison: Chest radiographs 10/28/2003

CLINICAL DATA: 59-year-old male with chest pain radiating to the
jaw and left upper extremity. Initial encounter.

EXAM:
CHEST  2 VIEW

[chest pa]
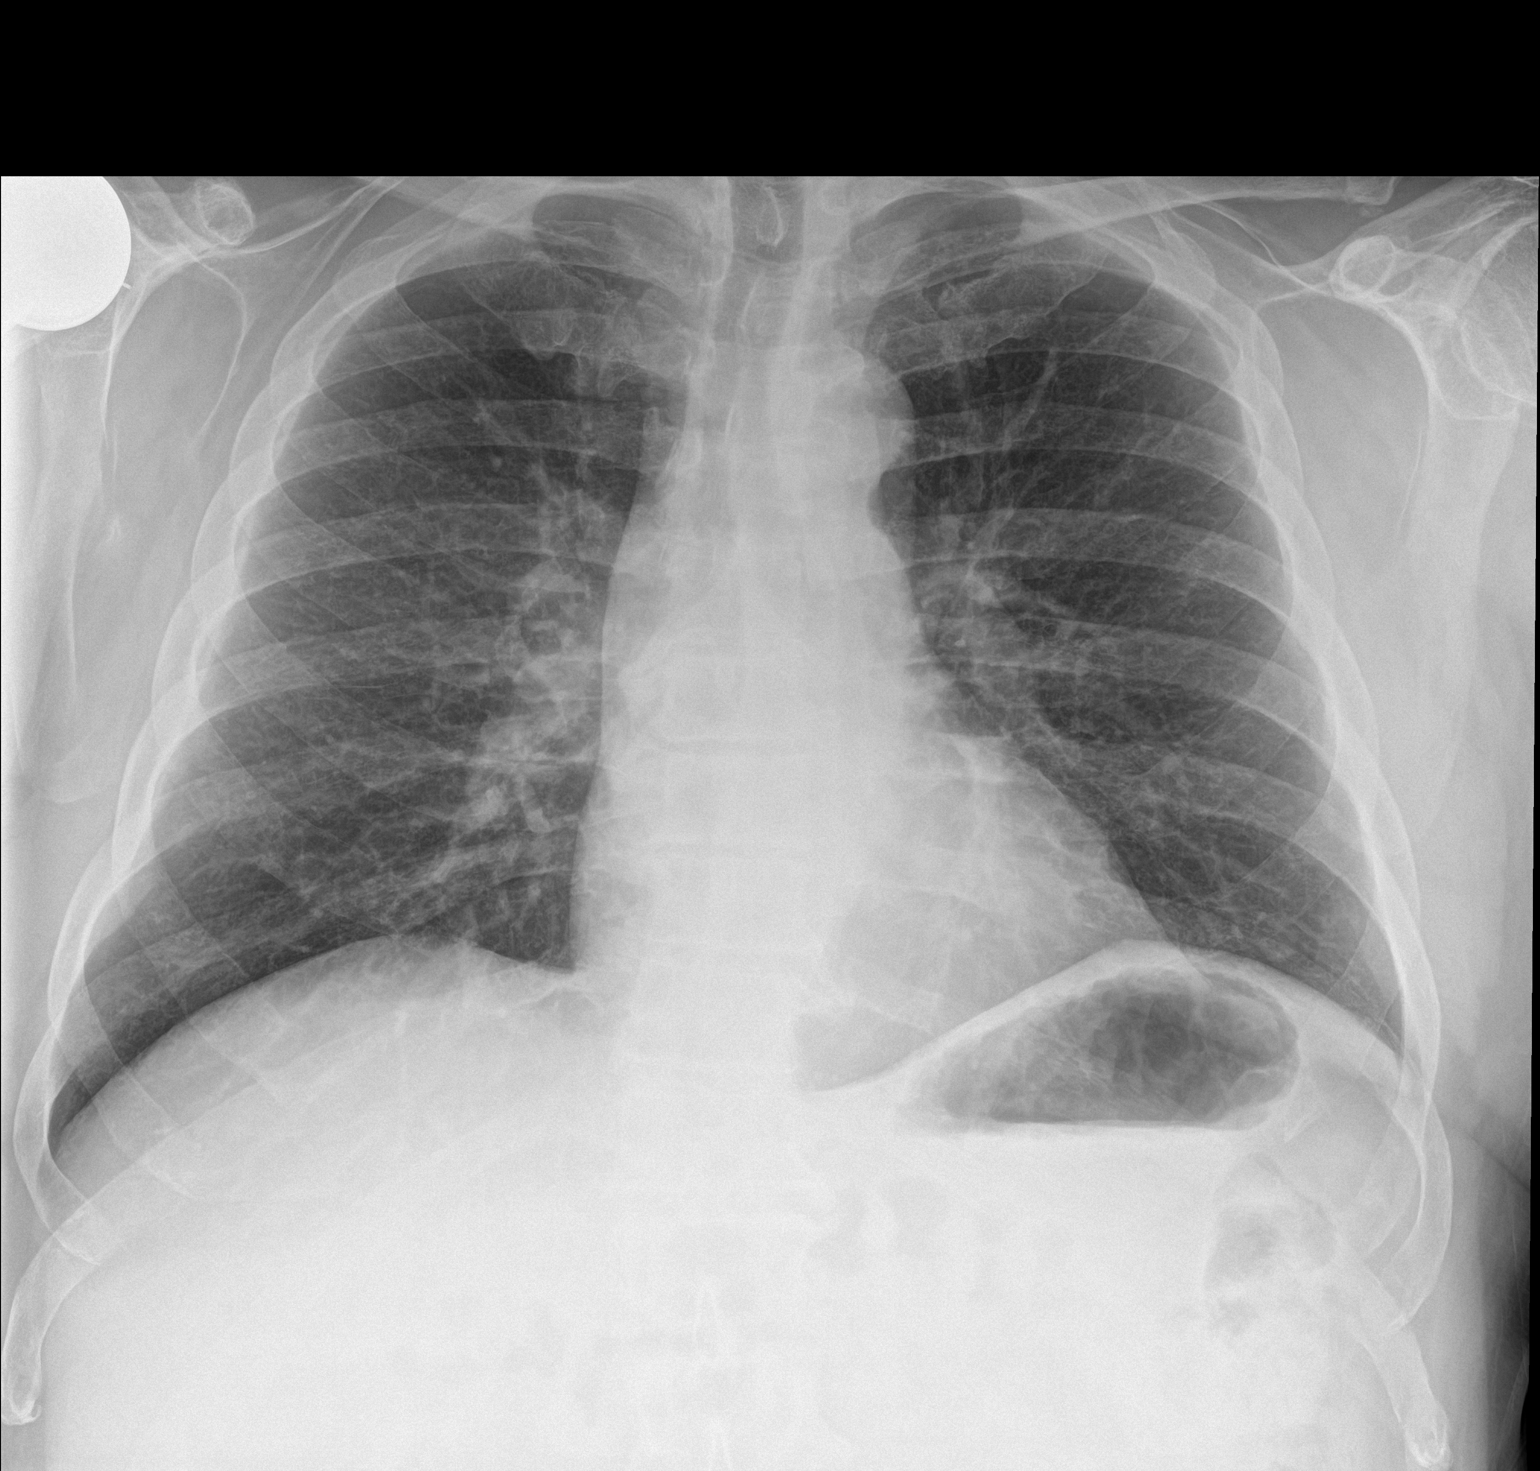

[chest lat]
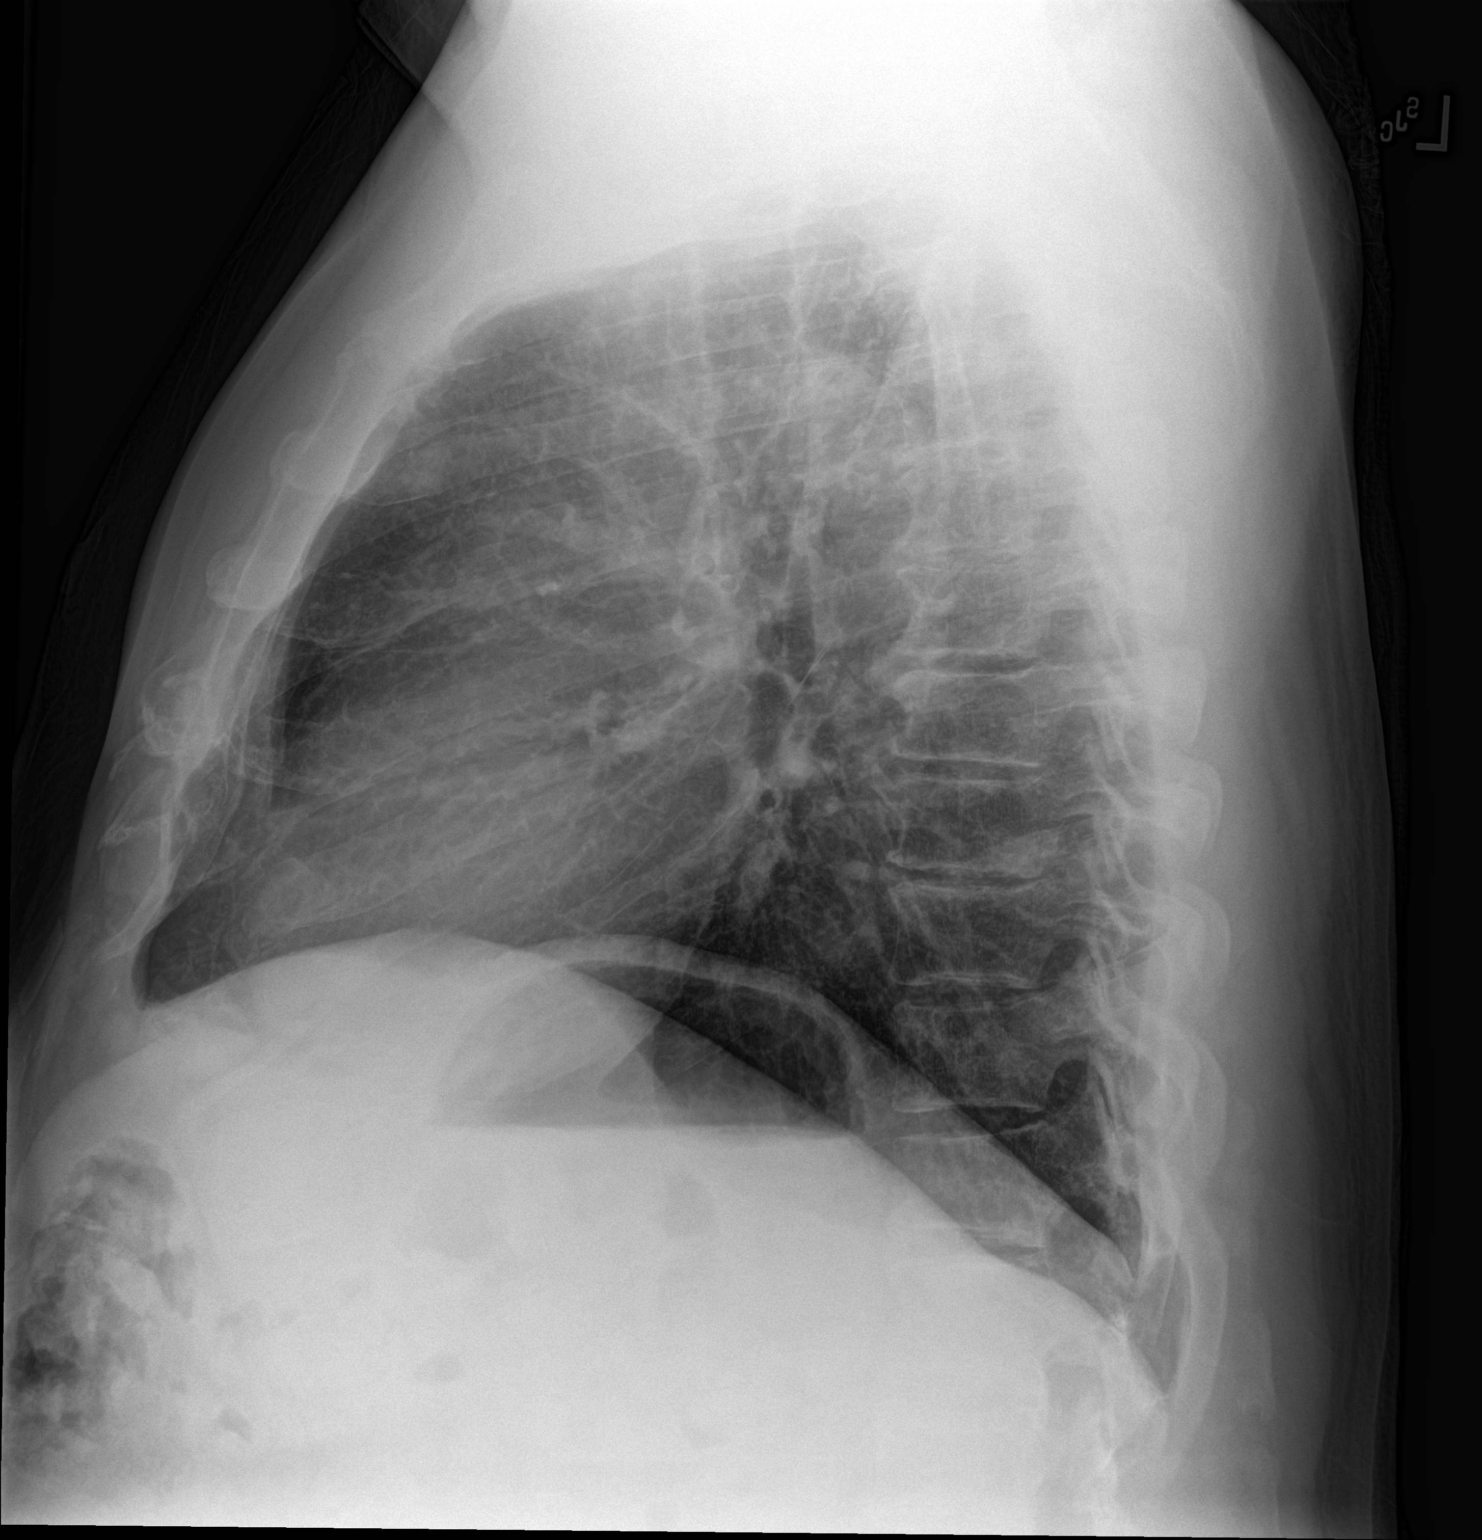

[2 of 2 positions shown; findings below may reference images not displayed]

FINDINGS: Lung volumes remain within normal limits. Normal cardiac size and
mediastinal contours. Visualized tracheal air column is within
normal limits. No pneumothorax, pulmonary edema, pleural effusion or
confluent pulmonary opacity. No acute osseous abnormality
identified. Right shoulder arthroplasty is new since 0889.
IMPRESSION: No acute cardiopulmonary abnormality.

## 2016-11-08 ENCOUNTER — Other Ambulatory Visit
Admission: RE | Admit: 2016-11-08 | Discharge: 2016-11-08 | Disposition: A | Discharge status: Home / Self Care | Payer: BLUE CROSS/BLUE SHIELD | Source: Ambulatory Visit | Attending: Cardiovascular Disease | Admitting: Cardiovascular Disease

## 2016-11-08 DIAGNOSIS — I10 Essential (primary) hypertension: Secondary | ICD-10-CM | POA: Insufficient documentation

## 2016-11-08 DIAGNOSIS — Z79899 Other long term (current) drug therapy: Secondary | ICD-10-CM | POA: Insufficient documentation

## 2016-11-08 LAB — BASIC METABOLIC PANEL
ANION GAP: 10 (ref 5–15)
BUN: 20 mg/dL (ref 6–20)
CALCIUM: 9.8 mg/dL (ref 8.9–10.3)
CO2: 25 mmol/L (ref 22–32)
Chloride: 106 mmol/L (ref 101–111)
Creatinine, Ser: 1.1 mg/dL (ref 0.61–1.24)
GFR calc Af Amer: >60 mL/min (ref >60)
GFR calc non Af Amer: >60 mL/min (ref >60)
GLUCOSE: 105 mg/dL — AB (ref 65–99)
Potassium: 4.3 mmol/L (ref 3.5–5.1)
Sodium: 141 mmol/L (ref 135–145)

## 2016-11-29 DIAGNOSIS — Z23 Encounter for immunization: Secondary | ICD-10-CM | POA: Diagnosis not present

## 2016-12-04 ENCOUNTER — Inpatient Hospital Stay: Payer: BLUE CROSS/BLUE SHIELD | Attending: Oncology | Admitting: Oncology

## 2016-12-04 ENCOUNTER — Encounter: Payer: Self-pay | Admitting: Oncology

## 2016-12-04 ENCOUNTER — Other Ambulatory Visit: Payer: Self-pay

## 2016-12-04 ENCOUNTER — Inpatient Hospital Stay: Payer: BLUE CROSS/BLUE SHIELD

## 2016-12-04 DIAGNOSIS — E785 Hyperlipidemia, unspecified: Secondary | ICD-10-CM | POA: Insufficient documentation

## 2016-12-04 DIAGNOSIS — M109 Gout, unspecified: Secondary | ICD-10-CM | POA: Insufficient documentation

## 2016-12-04 DIAGNOSIS — Z96619 Presence of unspecified artificial shoulder joint: Secondary | ICD-10-CM | POA: Diagnosis not present

## 2016-12-04 DIAGNOSIS — Z79899 Other long term (current) drug therapy: Secondary | ICD-10-CM | POA: Diagnosis not present

## 2016-12-04 DIAGNOSIS — Z7982 Long term (current) use of aspirin: Secondary | ICD-10-CM | POA: Diagnosis not present

## 2016-12-04 DIAGNOSIS — I10 Essential (primary) hypertension: Secondary | ICD-10-CM | POA: Insufficient documentation

## 2016-12-04 DIAGNOSIS — F419 Anxiety disorder, unspecified: Secondary | ICD-10-CM | POA: Insufficient documentation

## 2016-12-04 DIAGNOSIS — M47816 Spondylosis without myelopathy or radiculopathy, lumbar region: Secondary | ICD-10-CM | POA: Diagnosis not present

## 2016-12-04 DIAGNOSIS — Z6833 Body mass index (BMI) 33.0-33.9, adult: Secondary | ICD-10-CM | POA: Diagnosis not present

## 2016-12-04 DIAGNOSIS — G4730 Sleep apnea, unspecified: Secondary | ICD-10-CM | POA: Insufficient documentation

## 2016-12-04 DIAGNOSIS — F329 Major depressive disorder, single episode, unspecified: Secondary | ICD-10-CM | POA: Diagnosis not present

## 2016-12-04 LAB — COMPREHENSIVE METABOLIC PANEL
ALBUMIN: 4.5 g/dL (ref 3.5–5.0)
ALT: 38 U/L (ref 17–63)
AST: 27 U/L (ref 15–41)
Alkaline Phosphatase: 62 U/L (ref 38–126)
Anion gap: 10 (ref 5–15)
BUN: 24 mg/dL — AB (ref 6–20)
CHLORIDE: 102 mmol/L (ref 101–111)
CO2: 25 mmol/L (ref 22–32)
CREATININE: 1.16 mg/dL (ref 0.61–1.24)
Calcium: 9.3 mg/dL (ref 8.9–10.3)
GFR calc Af Amer: >60 mL/min (ref >60)
GFR calc non Af Amer: >60 mL/min (ref >60)
GLUCOSE: 108 mg/dL — AB (ref 65–99)
POTASSIUM: 4.7 mmol/L (ref 3.5–5.1)
SODIUM: 137 mmol/L (ref 135–145)
Total Bilirubin: 0.7 mg/dL (ref 0.3–1.2)
Total Protein: 8.1 g/dL (ref 6.5–8.1)

## 2016-12-04 LAB — CBC WITH DIFFERENTIAL/PLATELET
BASOS ABS: 0.1 10*3/uL (ref 0–0.1)
BASOS PCT: 1 %
EOS PCT: 6 %
Eosinophils Absolute: 0.5 10*3/uL (ref 0–0.7)
HCT: 45.7 % (ref 40.0–52.0)
Hemoglobin: 15.7 g/dL (ref 13.0–18.0)
LYMPHS PCT: 20 %
Lymphs Abs: 1.6 10*3/uL (ref 1.0–3.6)
MCH: 32.1 pg (ref 26.0–34.0)
MCHC: 34.3 g/dL (ref 32.0–36.0)
MCV: 93.7 fL (ref 80.0–100.0)
MONO ABS: 0.6 10*3/uL (ref 0.2–1.0)
Monocytes Relative: 8 %
Neutro Abs: 5.1 10*3/uL (ref 1.4–6.5)
Neutrophils Relative %: 65 %
PLATELETS: 269 10*3/uL (ref 150–440)
RBC: 4.88 MIL/uL (ref 4.40–5.90)
RDW: 12.6 % (ref 11.5–14.5)
WBC: 7.8 10*3/uL (ref 3.8–10.6)

## 2016-12-04 LAB — IRON AND TIBC
IRON: 142 ug/dL (ref 45–182)
Saturation Ratios: 42 % — ABNORMAL HIGH (ref 17.9–39.5)
TIBC: 335 ug/dL (ref 250–450)
UIBC: 193 ug/dL

## 2016-12-04 LAB — FERRITIN: FERRITIN: 327 ng/mL (ref 24–336)

## 2016-12-04 NOTE — Progress Notes (Signed)
Hematology/Oncology Consult note [facility] Telephone:([phone] [phone]   Patient Care Team: Arlis Porta., MD as PCP - General (Unknown Physician Specialty)  REFERRING PROVIDER: Dr.Rabinowitz. PURPOSE OF CONSULTATION:  hemochromotosis  HISTORY OF PRESENTING ILLNESS:  Robert Greer is a  61 y.o.  male with PMH listed below who was referred to me for evaluation of  hemochromatosis. Patient is a Airline pilot. Patient was seen at [facility]. He had some lab work done. His iron has been persistently elevated. Most recent serum iron level is 196. Ferritin is elevated at 510. Hemochromatosis mutation PCR was checked. Patient is positive for compound heterozygous with C2 82Y/H63D. Patient also has mild elevation of transaminitis ALT is elevated at 47, normal AST 32. LDH 207. Creatinine 1.07. TSH is elevated at 5.6 hemoglobin 15, platelet 273, WBC 7.8. Elevated eosinophil count on differential at 0.5. Breast differential normal.   ROS Constitutional: Negative for fever, night sweats,change in appetite. unintentional weight loss, positive for fatigue HENT: Negative for ear pain, hearing loss, nasal bleeding Eyes: Negative for eye pain, double vision   Respiratory: Negative for wheezing, shortness of breath, cough Cardiovascular: Negative for chest pain, palpitation.   Gastrointestinal: Negative abdominal pain, diarrhea, nausea vomiting Endocrine: Negative  Genitourinary: Negative for dysuria, hematuria, frequency Skin: Negative for rash, iching, bruising Neurological: Negative for headache, dizziness, seizure Hematological: Negative for easy bruising/bleeding, lymph node enlargement Psychiatric/Behavioral: Negative for depression, anxiety, suicidality  MEDICAL HISTORY:  Past Medical History:  Diagnosis Date  . Anxiety   . Arthritis   . Backache, unspecified   . Depressive disorder, not elsewhere classified   .  Gout   . Hyperlipidemia   . Hypertension   . Measles   . Mobitz (type) II atrioventricular block   . Mumps   . Sleep apnea     SURGICAL HISTORY: Past Surgical History:  Procedure Laterality Date  . ANTERIOR FUSION LUMBAR SPINE    . BACK SURGERY  2012   x2  . COLONOSCOPY  2005  . KNEE SURGERY     bilateral  . TONSILLECTOMY AND ADENOIDECTOMY    . TOTAL SHOULDER REPLACEMENT Right     SOCIAL HISTORY: Social History   Socioeconomic History  . Marital status: Married    Spouse name: Not on file  . Number of children: Not on file  . Years of education: Not on file  . Highest education level: Not on file  Social Needs  . Financial resource strain: Not on file  . Food insecurity - worry: Not on file  . Food insecurity - inability: Not on file  . Transportation needs - medical: Not on file  . Transportation needs - non-medical: Not on file  Occupational History  . Not on file  Tobacco Use  . Smoking status: Never Smoker  . Smokeless tobacco: Current User    Types: Snuff  Substance and Sexual Activity  . Alcohol use: Yes    Alcohol/week: 1.2 - 1.8 oz    Types: 2 - 3 Cans of beer per week    Comment: ocassionally  . Drug use: No  . Sexual activity: Not on file  Other Topics Concern  . Not on file  Social History Narrative  . Not on file    FAMILY HISTORY: Family History  Problem Relation Age of Onset  . Stroke Mother   . Liver disease Father   . Lung cancer Sister   . Diabetes Sister   . Liver  disease Brother   . Lung cancer Paternal Grandmother     ALLERGIES:  has No Known Allergies.  MEDICATIONS:  Current Outpatient Medications  Medication Sig Dispense Refill  . allopurinol (ZYLOPRIM) 100 MG tablet Take 100 mg by mouth daily.    Marland Kitchen aspirin 81 MG chewable tablet Chew 1 tablet (81 mg total) by mouth daily. 30 tablet 3  . atorvastatin (LIPITOR) 20 MG tablet Take 1 tablet (20 mg total) by mouth daily. 90 tablet 3  . cetirizine (ZYRTEC) 10 MG tablet Take 10  mg by mouth as needed for allergies. Reported on 02/09/2015    . clopidogrel (PLAVIX) 75 MG tablet Take 1 tablet (75 mg total) by mouth daily. 90 tablet 3  . DULoxetine (CYMBALTA) 20 MG capsule Take 40 mg by mouth daily.   0  . ezetimibe (ZETIA) 10 MG tablet Take 1 tablet (10 mg total) by mouth daily. 30 tablet 5  . losartan (COZAAR) 25 MG tablet Take 1 tablet (25 mg total) by mouth daily. 90 tablet 3  . meloxicam (MOBIC) 7.5 MG tablet Take 15 mg by mouth daily.     Marland Kitchen oxyCODONE-acetaminophen (PERCOCET) 10-325 MG tablet Take 1 tablet by mouth every 4 (four) hours as needed for pain.    Marland Kitchen colchicine 0.6 MG tablet Take 0.6 mg by mouth.    . nitroGLYCERIN (NITROSTAT) 0.4 MG SL tablet Place 1 tablet (0.4 mg total) under the tongue every 5 (five) minutes as needed for chest pain. (Patient not taking: Reported on 12/04/2016) 25 tablet 3   No current facility-administered medications for this visit.       Marland Kitchen  PHYSICAL EXAMINATION: ECOG PERFORMANCE STATUS: 0 - Asymptomatic Vitals:   12/04/16 0959  BP: 134/81  Pulse: 69  Temp: 98.6 F (37 C)   Filed Weights   12/04/16 0959  Weight: 245 lb 6 oz (111.3 kg)    GENERAL: No distress, well nourished.  SKIN:  No rashes or significant lesions  HEAD: Normocephalic, No masses, lesions, tenderness or abnormalities  EYES: Conjunctiva are pink, non icteric ENT: External ears normal ,lips , buccal mucosa, and tongue normal and mucous membranes are moist  LYMPH: No palpable cervical and axillary lymphadenopathy  LUNGS: Clear to auscultation, no crackles or wheezes HEART: Regular rate & rhythm, no murmurs, no gallops, S1 normal and S2 normal  ABDOMEN: Abdomen soft, non-tender, normal bowel sounds, I did not appreciate any  masses or organomegaly  MUSCULOSKELETAL: No CVA tenderness and no tenderness on percussion of the back or rib cage.  EXTREMITIES: No edema, no skin discoloration or tenderness NEURO: Alert & oriented, no focal motor/sensory  deficits.    LABORATORY DATA:  I have reviewed the data as listed Lab Results  Component Value Date   WBC 7.8 12/04/2016   HGB 15.7 12/04/2016   HCT 45.7 12/04/2016   MCV 93.7 12/04/2016   PLT 269 12/04/2016   Recent Labs    11/08/16 1005 12/04/16 1115  NA 141 137  K 4.3 4.7  CL 106 102  CO2 25 25  GLUCOSE 105* 108*  BUN 20 24*  CREATININE 1.10 1.16  CALCIUM 9.8 9.3  GFRNONAA >60 >60  GFRAA >60 >60  PROT  --  8.1  ALBUMIN  --  4.5  AST  --  27  ALT  --  38  ALKPHOS  --  62  BILITOT  --  0.7   ASSESSMENT & PLAN:  1. Hereditary hemochromatosis ([place])   2. Iron overload  discussed with patient and his wife regarding hereditary hemochromatosis. Recommend therapeutic phlebotomy 2 units weekly for 3 times. Goal of ferritin he prefer to be less than 50, or at least 100.   discussed with patient to avoid alcohol, avoid uncooked seafood Check CBC, CMP, iron TIBC, ferritin All questions were answered. The patient knows to call the clinic with any problems questions or concerns.  Return of visit:  follow-up in 4 weeks with labs done ahead of visit.  Thank you for this kind referral and the opportunity to participate in the care of this patient. A copy of today's note is routed to referring provider    Earlie Server, MD, PhD Hematology Oncology [facility] Pager- [phone] 12/04/2016

## 2016-12-04 NOTE — Progress Notes (Signed)
Patient here today as a new patient  

## 2016-12-05 ENCOUNTER — Other Ambulatory Visit: Payer: Self-pay | Admitting: Oncology

## 2016-12-05 ENCOUNTER — Encounter: Payer: Self-pay | Admitting: Oncology

## 2016-12-05 ENCOUNTER — Inpatient Hospital Stay: Payer: BLUE CROSS/BLUE SHIELD

## 2016-12-05 DIAGNOSIS — G4730 Sleep apnea, unspecified: Secondary | ICD-10-CM | POA: Diagnosis not present

## 2016-12-05 DIAGNOSIS — F329 Major depressive disorder, single episode, unspecified: Secondary | ICD-10-CM | POA: Diagnosis not present

## 2016-12-05 DIAGNOSIS — M109 Gout, unspecified: Secondary | ICD-10-CM | POA: Diagnosis not present

## 2016-12-05 DIAGNOSIS — F419 Anxiety disorder, unspecified: Secondary | ICD-10-CM | POA: Diagnosis not present

## 2016-12-05 DIAGNOSIS — E785 Hyperlipidemia, unspecified: Secondary | ICD-10-CM | POA: Diagnosis not present

## 2016-12-05 DIAGNOSIS — Z79899 Other long term (current) drug therapy: Secondary | ICD-10-CM | POA: Diagnosis not present

## 2016-12-05 DIAGNOSIS — I10 Essential (primary) hypertension: Secondary | ICD-10-CM | POA: Diagnosis not present

## 2016-12-05 DIAGNOSIS — Z7982 Long term (current) use of aspirin: Secondary | ICD-10-CM | POA: Diagnosis not present

## 2016-12-05 DIAGNOSIS — Z96619 Presence of unspecified artificial shoulder joint: Secondary | ICD-10-CM | POA: Diagnosis not present

## 2016-12-05 HISTORY — DX: Other hemochromatosis: E83.118

## 2016-12-06 LAB — HEMOGLOBIN A1C
HEMOGLOBIN A1C: 5.7 % — AB (ref 4.8–5.6)
MEAN PLASMA GLUCOSE: 116.89 mg/dL

## 2016-12-09 DIAGNOSIS — Z23 Encounter for immunization: Secondary | ICD-10-CM | POA: Diagnosis not present

## 2016-12-11 ENCOUNTER — Inpatient Hospital Stay: Payer: BLUE CROSS/BLUE SHIELD

## 2016-12-11 DIAGNOSIS — E785 Hyperlipidemia, unspecified: Secondary | ICD-10-CM | POA: Diagnosis not present

## 2016-12-11 DIAGNOSIS — Z79899 Other long term (current) drug therapy: Secondary | ICD-10-CM | POA: Diagnosis not present

## 2016-12-11 DIAGNOSIS — Z96619 Presence of unspecified artificial shoulder joint: Secondary | ICD-10-CM | POA: Diagnosis not present

## 2016-12-11 DIAGNOSIS — F329 Major depressive disorder, single episode, unspecified: Secondary | ICD-10-CM | POA: Diagnosis not present

## 2016-12-11 DIAGNOSIS — Z7982 Long term (current) use of aspirin: Secondary | ICD-10-CM | POA: Diagnosis not present

## 2016-12-11 DIAGNOSIS — I10 Essential (primary) hypertension: Secondary | ICD-10-CM | POA: Diagnosis not present

## 2016-12-11 DIAGNOSIS — M109 Gout, unspecified: Secondary | ICD-10-CM | POA: Diagnosis not present

## 2016-12-11 DIAGNOSIS — G4730 Sleep apnea, unspecified: Secondary | ICD-10-CM | POA: Diagnosis not present

## 2016-12-11 DIAGNOSIS — F419 Anxiety disorder, unspecified: Secondary | ICD-10-CM | POA: Diagnosis not present

## 2016-12-18 ENCOUNTER — Telehealth: Payer: Self-pay

## 2016-12-18 ENCOUNTER — Inpatient Hospital Stay: Payer: BLUE CROSS/BLUE SHIELD

## 2016-12-18 DIAGNOSIS — Z96619 Presence of unspecified artificial shoulder joint: Secondary | ICD-10-CM | POA: Diagnosis not present

## 2016-12-18 DIAGNOSIS — M109 Gout, unspecified: Secondary | ICD-10-CM | POA: Diagnosis not present

## 2016-12-18 DIAGNOSIS — F329 Major depressive disorder, single episode, unspecified: Secondary | ICD-10-CM | POA: Diagnosis not present

## 2016-12-18 DIAGNOSIS — G4730 Sleep apnea, unspecified: Secondary | ICD-10-CM | POA: Diagnosis not present

## 2016-12-18 DIAGNOSIS — E785 Hyperlipidemia, unspecified: Secondary | ICD-10-CM | POA: Diagnosis not present

## 2016-12-18 DIAGNOSIS — I10 Essential (primary) hypertension: Secondary | ICD-10-CM | POA: Diagnosis not present

## 2016-12-18 DIAGNOSIS — Z7982 Long term (current) use of aspirin: Secondary | ICD-10-CM | POA: Diagnosis not present

## 2016-12-18 DIAGNOSIS — Z79899 Other long term (current) drug therapy: Secondary | ICD-10-CM | POA: Diagnosis not present

## 2016-12-18 DIAGNOSIS — F419 Anxiety disorder, unspecified: Secondary | ICD-10-CM | POA: Diagnosis not present

## 2016-12-18 NOTE — Telephone Encounter (Signed)
Pt here for phlebotomy today, per Dr Collie Siad note, pt is to have 3 phlebotomies and return for lab and MD encounter. However, pt states this is his 3rd phlebotomy and he is scheduled for one more on 12/4. Per checkout note for scheduling pt is to be scheduled for 4 phlebotomies and pt is questioning what he should do. Called Dr Tasia Catchings on her cell and she states she will look into tomorrow 11/28. Can you call pt and let him know what he needs to do?? Thx!

## 2016-12-18 NOTE — Telephone Encounter (Signed)
This was a scheduling error.  I made sure everything was corrected.

## 2016-12-24 ENCOUNTER — Other Ambulatory Visit: Payer: Medicare Other

## 2016-12-25 ENCOUNTER — Inpatient Hospital Stay: Payer: BLUE CROSS/BLUE SHIELD | Attending: Oncology

## 2016-12-25 DIAGNOSIS — I10 Essential (primary) hypertension: Secondary | ICD-10-CM | POA: Diagnosis not present

## 2016-12-25 DIAGNOSIS — Z96619 Presence of unspecified artificial shoulder joint: Secondary | ICD-10-CM | POA: Diagnosis not present

## 2016-12-25 DIAGNOSIS — F419 Anxiety disorder, unspecified: Secondary | ICD-10-CM | POA: Insufficient documentation

## 2016-12-25 DIAGNOSIS — M109 Gout, unspecified: Secondary | ICD-10-CM | POA: Insufficient documentation

## 2016-12-25 DIAGNOSIS — K921 Melena: Secondary | ICD-10-CM | POA: Diagnosis not present

## 2016-12-25 DIAGNOSIS — F329 Major depressive disorder, single episode, unspecified: Secondary | ICD-10-CM | POA: Diagnosis not present

## 2016-12-25 DIAGNOSIS — G4730 Sleep apnea, unspecified: Secondary | ICD-10-CM | POA: Insufficient documentation

## 2016-12-25 DIAGNOSIS — Z7982 Long term (current) use of aspirin: Secondary | ICD-10-CM | POA: Insufficient documentation

## 2016-12-25 DIAGNOSIS — E785 Hyperlipidemia, unspecified: Secondary | ICD-10-CM | POA: Diagnosis not present

## 2016-12-25 DIAGNOSIS — Z955 Presence of coronary angioplasty implant and graft: Secondary | ICD-10-CM | POA: Insufficient documentation

## 2016-12-25 DIAGNOSIS — Z79899 Other long term (current) drug therapy: Secondary | ICD-10-CM | POA: Diagnosis not present

## 2016-12-25 LAB — CBC WITH DIFFERENTIAL/PLATELET
BASOS PCT: 1 %
Basophils Absolute: 0 10*3/uL (ref 0–0.1)
EOS ABS: 0.3 10*3/uL (ref 0–0.7)
Eosinophils Relative: 3 %
HCT: 39 % — ABNORMAL LOW (ref 40.0–52.0)
HEMOGLOBIN: 13.3 g/dL (ref 13.0–18.0)
Lymphocytes Relative: 20 %
Lymphs Abs: 1.5 10*3/uL (ref 1.0–3.6)
MCH: 32.2 pg (ref 26.0–34.0)
MCHC: 34.1 g/dL (ref 32.0–36.0)
MCV: 94.5 fL (ref 80.0–100.0)
Monocytes Absolute: 0.5 10*3/uL (ref 0.2–1.0)
Monocytes Relative: 7 %
NEUTROS PCT: 69 %
Neutro Abs: 5.4 10*3/uL (ref 1.4–6.5)
PLATELETS: 287 10*3/uL (ref 150–440)
RBC: 4.13 MIL/uL — AB (ref 4.40–5.90)
RDW: 13.4 % (ref 11.5–14.5)
WBC: 7.8 10*3/uL (ref 3.8–10.6)

## 2016-12-25 LAB — IRON AND TIBC
Iron: 70 ug/dL (ref 45–182)
SATURATION RATIOS: 24 % (ref 17.9–39.5)
TIBC: 288 ug/dL (ref 250–450)
UIBC: 218 ug/dL

## 2016-12-25 LAB — FERRITIN: FERRITIN: 195 ng/mL (ref 24–336)

## 2016-12-27 ENCOUNTER — Inpatient Hospital Stay: Payer: BLUE CROSS/BLUE SHIELD | Admitting: Oncology

## 2016-12-27 ENCOUNTER — Other Ambulatory Visit: Payer: Self-pay | Admitting: Oncology

## 2016-12-27 ENCOUNTER — Inpatient Hospital Stay: Payer: BLUE CROSS/BLUE SHIELD

## 2016-12-27 NOTE — Progress Notes (Unsigned)
Hematology/Oncology Consult note [facility] Telephone:([phone] [phone]   Patient Care Team: Arlis Porta., MD as PCP - General (Unknown Physician Specialty)  REFERRING PROVIDER: Dr.Rabinowitz. PURPOSE OF CONSULTATION:  hemochromotosis  HISTORY OF PRESENTING ILLNESS:  Robert Greer is a  61 y.o.  male with PMH listed below who was referred to me for evaluation of  hemochromatosis. Patient is a Airline pilot. Patient was seen at city [facility] occupational health clinic. He had some lab work done. His iron has been persistently elevated. Most recent serum iron level is 196. Ferritin is elevated at 510. Hemochromatosis mutation PCR was checked. Patient is positive for compound heterozygous with C2 82Y/H63D. Patient also has mild elevation of transaminitis ALT is elevated at 47, normal AST 32. LDH 207. Creatinine 1.07. TSH is elevated at 5.6 hemoglobin 15, platelet 273, WBC 7.8. Elevated eosinophil count on differential at 0.5. Breast differential normal.   ROS Constitutional: Negative for fever, night sweats,change in appetite. unintentional weight loss, positive for fatigue HENT: Negative for ear pain, hearing loss, nasal bleeding Eyes: Negative for eye pain, double vision   Respiratory: Negative for wheezing, shortness of breath, cough Cardiovascular: Negative for chest pain, palpitation.   Gastrointestinal: Negative abdominal pain, diarrhea, nausea vomiting Endocrine: Negative  Genitourinary: Negative for dysuria, hematuria, frequency Skin: Negative for rash, iching, bruising Neurological: Negative for headache, dizziness, seizure Hematological: Negative for easy bruising/bleeding, lymph node enlargement Psychiatric/Behavioral: Negative for depression, anxiety, suicidality  MEDICAL HISTORY:  Past Medical History:  Diagnosis Date  . Anxiety   . Arthritis   . Backache, unspecified   . Depressive disorder, not elsewhere classified   .  Gout   . Hyperlipidemia   . Hypertension   . Measles   . Mobitz (type) II atrioventricular block   . Mumps   . Other hemochromatosis 12/05/2016  . Sleep apnea     SURGICAL HISTORY: Past Surgical History:  Procedure Laterality Date  . ANTERIOR FUSION LUMBAR SPINE    . BACK SURGERY  2012   x2  . CARDIAC CATHETERIZATION N/A 01/10/2015   Procedure: Left Heart Cath and Coronary Angiography;  Surgeon: Wellington Hampshire, MD;  Location: [place] [facility];  Service: Cardiovascular;  Laterality: N/A;  . CARDIAC CATHETERIZATION N/A 01/10/2015   Procedure: Coronary Stent Intervention;  Surgeon: Wellington Hampshire, MD;  Location: [place] [facility];  Service: Cardiovascular;  Laterality: N/A;  . COLONOSCOPY  2005  . KNEE SURGERY     bilateral  . TONSILLECTOMY AND ADENOIDECTOMY    . TOTAL SHOULDER REPLACEMENT Right     SOCIAL HISTORY: Social History   Socioeconomic History  . Marital status: Married    Spouse name: Not on file  . Number of children: Not on file  . Years of education: Not on file  . Highest education level: Not on file  Social Needs  . Financial resource strain: Not on file  . Food insecurity - worry: Not on file  . Food insecurity - inability: Not on file  . Transportation needs - medical: Not on file  . Transportation needs - non-medical: Not on file  Occupational History  . Not on file  Tobacco Use  . Smoking status: Never Smoker  . Smokeless tobacco: Current User    Types: Snuff  Substance and Sexual Activity  . Alcohol use: Yes    Alcohol/week: 1.2 - 1.8 oz    Types: 2 - 3 Cans of beer per week    Comment: ocassionally  .  Drug use: No  . Sexual activity: Not on file  Other Topics Concern  . Not on file  Social History Narrative  . Not on file    FAMILY HISTORY: Family History  Problem Relation Age of Onset  . Stroke Mother   . Liver disease Father   . Lung cancer Sister   . Diabetes Sister   . Liver disease Brother   . Lung cancer Paternal  Grandmother     ALLERGIES:  has No Known Allergies.  MEDICATIONS:  Current Outpatient Medications  Medication Sig Dispense Refill  . allopurinol (ZYLOPRIM) 100 MG tablet Take 100 mg by mouth daily.    Marland Kitchen aspirin 81 MG chewable tablet Chew 1 tablet (81 mg total) by mouth daily. 30 tablet 3  . atorvastatin (LIPITOR) 20 MG tablet Take 1 tablet (20 mg total) by mouth daily. 90 tablet 3  . cetirizine (ZYRTEC) 10 MG tablet Take 10 mg by mouth as needed for allergies. Reported on 02/09/2015    . clopidogrel (PLAVIX) 75 MG tablet Take 1 tablet (75 mg total) by mouth daily. 90 tablet 3  . colchicine 0.6 MG tablet Take 0.6 mg by mouth.    . DULoxetine (CYMBALTA) 20 MG capsule Take 40 mg by mouth daily.   0  . ezetimibe (ZETIA) 10 MG tablet Take 1 tablet (10 mg total) by mouth daily. 30 tablet 5  . losartan (COZAAR) 25 MG tablet Take 1 tablet (25 mg total) by mouth daily. 90 tablet 3  . meloxicam (MOBIC) 7.5 MG tablet Take 15 mg by mouth daily.     . nitroGLYCERIN (NITROSTAT) 0.4 MG SL tablet Place 1 tablet (0.4 mg total) under the tongue every 5 (five) minutes as needed for chest pain. (Patient not taking: Reported on 12/04/2016) 25 tablet 3  . oxyCODONE-acetaminophen (PERCOCET) 10-325 MG tablet Take 1 tablet by mouth every 4 (four) hours as needed for pain.     No current facility-administered medications for this visit.       Marland Kitchen  PHYSICAL EXAMINATION: ECOG PERFORMANCE STATUS: 0 - Asymptomatic There were no vitals filed for this visit. There were no vitals filed for this visit.  GENERAL: No distress, well nourished.  SKIN:  No rashes or significant lesions  HEAD: Normocephalic, No masses, lesions, tenderness or abnormalities  EYES: Conjunctiva are pink, non icteric ENT: External ears normal ,lips , buccal mucosa, and tongue normal and mucous membranes are moist  LYMPH: No palpable cervical and axillary lymphadenopathy  LUNGS: Clear to auscultation, no crackles or wheezes HEART: Regular  rate & rhythm, no murmurs, no gallops, S1 normal and S2 normal  ABDOMEN: Abdomen soft, non-tender, normal bowel sounds, I did not appreciate any  masses or organomegaly  MUSCULOSKELETAL: No CVA tenderness and no tenderness on percussion of the back or rib cage.  EXTREMITIES: No edema, no skin discoloration or tenderness NEURO: Alert & oriented, no focal motor/sensory deficits.    LABORATORY DATA:  I have reviewed the data as listed Lab Results  Component Value Date   WBC 7.8 12/25/2016   HGB 13.3 12/25/2016   HCT 39.0 (L) 12/25/2016   MCV 94.5 12/25/2016   PLT 287 12/25/2016   Recent Labs    11/08/16 1005 12/04/16 1115  NA 141 137  K 4.3 4.7  CL 106 102  CO2 25 25  GLUCOSE 105* 108*  BUN 20 24*  CREATININE 1.10 1.16  CALCIUM 9.8 9.3  GFRNONAA >60 >60  GFRAA >60 >60  PROT  --  8.1  ALBUMIN  --  4.5  AST  --  27  ALT  --  38  ALKPHOS  --  62  BILITOT  --  0.7   ASSESSMENT & PLAN:  No diagnosis found.  discussed with patient and his wife regarding hereditary hemochromatosis. Recommend therapeutic phlebotomy 2 units weekly for 3 times. Goal of ferritin he prefer to be less than 50, or at least 100.   discussed with patient to avoid alcohol, avoid uncooked seafood Check CBC, CMP, iron TIBC, ferritin All questions were answered. The patient knows to call the clinic with any problems questions or concerns.  Return of visit:  follow-up in 4 weeks with labs done ahead of visit.  Thank you for this kind referral and the opportunity to participate in the care of this patient. A copy of today's note is routed to referring provider    Earlie Server, MD, PhD Hematology Oncology [facility] at [facility] Pager- [phone] 12/27/2016

## 2016-12-27 NOTE — Progress Notes (Deleted)
Hematology/Oncology Consult note [facility] Telephone:([phone] [phone]   Patient Care Team: Arlis Porta., MD as PCP - General (Unknown Physician Specialty)  REFERRING PROVIDER: Dr.Rabinowitz. PURPOSE OF CONSULTATION:  hemochromotosis  HISTORY OF PRESENTING ILLNESS:  Robert Greer is a  61 y.o.  male with PMH listed below who was referred to me for evaluation of  hemochromatosis. 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 He had some lab work done. His iron has been persistently elevated. Most recent serum iron level is 196. Ferritin is elevated at 510. Hemochromatosis mutation PCR was checked. Patient is positive for compound heterozygous with C2 82Y/H63D. Patient also has mild elevation of transaminitis ALT is elevated at 47, normal AST 32. LDH 207. Creatinine 1.07. TSH is elevated at 5.6 hemoglobin 15, platelet 273, WBC 7.8. Elevated eosinophil count on differential at 0.5. Breast differential normal.   ROS Constitutional: Negative for fever, night sweats,change in appetite. unintentional weight loss, positive for fatigue HENT: Negative for ear pain, hearing loss, nasal bleeding Eyes: Negative for eye pain, double vision   Respiratory: Negative for wheezing, shortness of breath, cough Cardiovascular: Negative for chest pain, palpitation.   Gastrointestinal: Negative abdominal pain, diarrhea, nausea vomiting Endocrine: Negative  Genitourinary: Negative for dysuria, hematuria, frequency Skin: Negative for rash, iching, bruising Neurological: Negative for headache, dizziness, seizure Hematological: Negative for easy bruising/bleeding, lymph node enlargement Psychiatric/Behavioral: Negative for depression, anxiety, suicidality  MEDICAL HISTORY:  Past Medical History:  Diagnosis Date  . Anxiety   . Arthritis   . Backache, unspecified   . Depressive disorder, not elsewhere classified   .  Gout   . Hyperlipidemia   . Hypertension   . Measles   . Mobitz (type) II atrioventricular block   . Mumps   . Other hemochromatosis 12/05/2016  . Sleep apnea     SURGICAL HISTORY: Past Surgical History:  Procedure Laterality Date  . ANTERIOR FUSION LUMBAR SPINE    . BACK SURGERY  2012   x2  . CARDIAC CATHETERIZATION N/A 01/10/2015   Procedure: Left Heart Cath and Coronary Angiography;  Surgeon: Wellington Hampshire, MD;  Location: [place] [facility];  Service: Cardiovascular;  Laterality: N/A;  . CARDIAC CATHETERIZATION N/A 01/10/2015   Procedure: Coronary Stent Intervention;  Surgeon: Wellington Hampshire, MD;  Location: [place] [facility];  Service: Cardiovascular;  Laterality: N/A;  . COLONOSCOPY  2005  . KNEE SURGERY     bilateral  . TONSILLECTOMY AND ADENOIDECTOMY    . TOTAL SHOULDER REPLACEMENT Right     SOCIAL HISTORY: Social History   Socioeconomic History  . Marital status: Married    Spouse name: Not on file  . Number of children: Not on file  . Years of education: Not on file  . Highest education level: Not on file  Social Needs  . Financial resource strain: Not on file  . Food insecurity - worry: Not on file  . Food insecurity - inability: Not on file  . Transportation needs - medical: Not on file  . Transportation needs - non-medical: Not on file  Occupational History  . Not on file  Tobacco Use  . Smoking status: Never Smoker  . Smokeless tobacco: Current User    Types: Snuff  Substance and Sexual Activity  . Alcohol use: Yes    Alcohol/week: 1.2 - 1.8 oz    Types: 2 - 3 Cans of beer per week    Comment: ocassionally  .  Drug use: No  . Sexual activity: Not on file  Other Topics Concern  . Not on file  Social History Narrative  . Not on file    FAMILY HISTORY: Family History  Problem Relation Age of Onset  . Stroke Mother   . Liver disease Father   . Lung cancer Sister   . Diabetes Sister   . Liver disease Brother   . Lung cancer Paternal  Grandmother     ALLERGIES:  has No Known Allergies.  MEDICATIONS:  Current Outpatient Medications  Medication Sig Dispense Refill  . allopurinol (ZYLOPRIM) 100 MG tablet Take 100 mg by mouth daily.    Marland Kitchen aspirin 81 MG chewable tablet Chew 1 tablet (81 mg total) by mouth daily. 30 tablet 3  . atorvastatin (LIPITOR) 20 MG tablet Take 1 tablet (20 mg total) by mouth daily. 90 tablet 3  . cetirizine (ZYRTEC) 10 MG tablet Take 10 mg by mouth as needed for allergies. Reported on 02/09/2015    . clopidogrel (PLAVIX) 75 MG tablet Take 1 tablet (75 mg total) by mouth daily. 90 tablet 3  . colchicine 0.6 MG tablet Take 0.6 mg by mouth.    . DULoxetine (CYMBALTA) 20 MG capsule Take 40 mg by mouth daily.   0  . ezetimibe (ZETIA) 10 MG tablet Take 1 tablet (10 mg total) by mouth daily. 30 tablet 5  . losartan (COZAAR) 25 MG tablet Take 1 tablet (25 mg total) by mouth daily. 90 tablet 3  . meloxicam (MOBIC) 7.5 MG tablet Take 15 mg by mouth daily.     . nitroGLYCERIN (NITROSTAT) 0.4 MG SL tablet Place 1 tablet (0.4 mg total) under the tongue every 5 (five) minutes as needed for chest pain. (Patient not taking: Reported on 12/04/2016) 25 tablet 3  . oxyCODONE-acetaminophen (PERCOCET) 10-325 MG tablet Take 1 tablet by mouth every 4 (four) hours as needed for pain.     No current facility-administered medications for this visit.       Marland Kitchen  PHYSICAL EXAMINATION: ECOG PERFORMANCE STATUS: 0 - Asymptomatic There were no vitals filed for this visit. There were no vitals filed for this visit.  GENERAL: No distress, well nourished.  SKIN:  No rashes or significant lesions  HEAD: Normocephalic, No masses, lesions, tenderness or abnormalities  EYES: Conjunctiva are pink, non icteric ENT: External ears normal ,lips , buccal mucosa, and tongue normal and mucous membranes are moist  LYMPH: No palpable cervical and axillary lymphadenopathy  LUNGS: Clear to auscultation, no crackles or wheezes HEART: Regular  rate & rhythm, no murmurs, no gallops, S1 normal and S2 normal  ABDOMEN: Abdomen soft, non-tender, normal bowel sounds, I did not appreciate any  masses or organomegaly  MUSCULOSKELETAL: No CVA tenderness and no tenderness on percussion of the back or rib cage.  EXTREMITIES: No edema, no skin discoloration or tenderness NEURO: Alert & oriented, no focal motor/sensory deficits.    LABORATORY DATA:  I have reviewed the data as listed Lab Results  Component Value Date   WBC 7.8 12/25/2016   HGB 13.3 12/25/2016   HCT 39.0 (L) 12/25/2016   MCV 94.5 12/25/2016   PLT 287 12/25/2016   Recent Labs    11/08/16 1005 12/04/16 1115  NA 141 137  K 4.3 4.7  CL 106 102  CO2 25 25  GLUCOSE 105* 108*  BUN 20 24*  CREATININE 1.10 1.16  CALCIUM 9.8 9.3  GFRNONAA >60 >60  GFRAA >60 >60  PROT  --  8.1  ALBUMIN  --  4.5  AST  --  27  ALT  --  38  ALKPHOS  --  62  BILITOT  --  0.7   ASSESSMENT & PLAN:  No diagnosis found.  discussed with patient and his wife regarding hereditary hemochromatosis. Recommend therapeutic phlebotomy 2 units weekly for 3 times. Goal of ferritin he prefer to be less than 50, or at least 100.   discussed with patient to avoid alcohol, avoid uncooked seafood Check CBC, CMP, iron TIBC, ferritin All questions were answered. The patient knows to call the clinic with any problems questions or concerns.  Return of visit:  follow-up in 4 weeks with labs done ahead of visit.  Thank you for this kind referral and the opportunity to participate in the care of this patient. A copy of today's note is routed to referring provider    Earlie Server, MD, PhD Hematology Oncology [facility] at [facility] Pager- [phone] 12/27/2016

## 2016-12-31 ENCOUNTER — Other Ambulatory Visit: Payer: Medicare Other

## 2017-01-01 ENCOUNTER — Ambulatory Visit: Payer: Medicare Other | Admitting: Oncology

## 2017-01-03 NOTE — Progress Notes (Signed)
Hematology/Oncology Consult note [facility] [phone] Fax:([phone]   Patient Care Team: Arlis Porta., MD as PCP - General (Unknown Physician Specialty)  REFERRING PROVIDER: Dr.Rabinowitz. PURPOSE OF CONSULTATION:  hemochromotosis  HISTORY OF PRESENTING ILLNESS:  Robert Greer is a  61 y.o.  male with PMH listed below who was referred to me for evaluation of  hemochromatosis. 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 He had some lab work done. His iron has been persistently elevated. Most recent serum iron level is 196. Ferritin is elevated at 510. Hemochromatosis mutation PCR was checked. Patient is positive for compound heterozygous with C2 82Y/H63D. Patient also has mild elevation of transaminitis ALT is elevated at 47, normal AST 32. LDH 207. Creatinine 1.07. TSH is elevated at 5.6 hemoglobin 15, platelet 273, WBC 7.8. Elevated eosinophil count on differential at 0.5. Rest differential was normal.   INTERVAL HISTORY Robert Greer is a 61 y.o. male who has above history reviewed by me today presents for follow up visit for management of hemochromatosis. He has had 3 phlebotomy during interval. He tolerates procedure well without feeling fatigue or dizziness. He mentioned to me today that recently he had one episode of dark stool. His last colonoscopy was when he was 62, he is due for repeat colonoscopy. Denies seeing any blood in the stool. Denies any bowel movement habit changes.  Review of Systems  Constitutional: Negative for chills and fever.  HENT: Negative for hearing loss.   Eyes: Negative for blurred vision.  Cardiovascular: Negative for chest pain.  Gastrointestinal: Positive for melena. Negative for heartburn.  Genitourinary: Negative for dysuria.  Skin: Negative for rash.  Neurological: Negative for dizziness.  Endo/Heme/Allergies: Does not bruise/bleed easily.    Psychiatric/Behavioral: Negative for depression.   MEDICAL HISTORY:  Past Medical History:  Diagnosis Date  . Anxiety   . Arthritis   . Backache, unspecified   . Depressive disorder, not elsewhere classified   . Gout   . Hyperlipidemia   . Hypertension   . Measles   . Mobitz (type) II atrioventricular block   . Mumps   . Other hemochromatosis 12/05/2016  . Sleep apnea     SURGICAL HISTORY: Past Surgical History:  Procedure Laterality Date  . ANTERIOR FUSION LUMBAR SPINE    . BACK SURGERY  2012   x2  . CARDIAC CATHETERIZATION N/A 01/10/2015   Procedure: Left Heart Cath and Coronary Angiography;  Surgeon: Wellington Hampshire, MD;  Location: [place] [facility];  Service: Cardiovascular;  Laterality: N/A;  . CARDIAC CATHETERIZATION N/A 01/10/2015   Procedure: Coronary Stent Intervention;  Surgeon: Wellington Hampshire, MD;  Location: [place] [facility];  Service: Cardiovascular;  Laterality: N/A;  . COLONOSCOPY  2005  . KNEE SURGERY     bilateral  . TONSILLECTOMY AND ADENOIDECTOMY    . TOTAL SHOULDER REPLACEMENT Right     SOCIAL HISTORY: Social History   Socioeconomic History  . Marital status: Married    Spouse name: Not on file  . Number of children: Not on file  . Years of education: Not on file  . Highest education level: Not on file  Social Needs  . Financial resource strain: Not on file  . Food insecurity - worry: Not on file  . Food insecurity - inability: Not on file  . Transportation needs - medical: Not on file  . Transportation needs - non-medical: Not on file  Occupational History  .  Not on file  Tobacco Use  . Smoking status: Never Smoker  . Smokeless tobacco: Current User    Types: Snuff  Substance and Sexual Activity  . Alcohol use: Yes    Alcohol/week: 1.2 - 1.8 oz    Types: 2 - 3 Cans of beer per week    Comment: ocassionally  . Drug use: No  . Sexual activity: Not on file  Other Topics Concern  . Not on file  Social History Narrative  .  Not on file    FAMILY HISTORY: Family History  Problem Relation Age of Onset  . Stroke Mother   . Liver disease Father   . Lung cancer Sister   . Diabetes Sister   . Liver disease Brother   . Lung cancer Paternal Grandmother     ALLERGIES:  has No Known Allergies.  MEDICATIONS:  Current Outpatient Medications  Medication Sig Dispense Refill  . allopurinol (ZYLOPRIM) 100 MG tablet Take 100 mg by mouth daily.    Marland Kitchen aspirin 81 MG chewable tablet Chew 1 tablet (81 mg total) by mouth daily. 30 tablet 3  . atorvastatin (LIPITOR) 20 MG tablet Take 1 tablet (20 mg total) by mouth daily. 90 tablet 3  . cetirizine (ZYRTEC) 10 MG tablet Take 10 mg by mouth as needed for allergies. Reported on 02/09/2015    . clopidogrel (PLAVIX) 75 MG tablet Take 1 tablet (75 mg total) by mouth daily. 90 tablet 3  . colchicine 0.6 MG tablet Take 0.6 mg by mouth.    . DULoxetine (CYMBALTA) 20 MG capsule Take 40 mg by mouth daily.   0  . ezetimibe (ZETIA) 10 MG tablet Take 1 tablet (10 mg total) by mouth daily. 30 tablet 5  . losartan (COZAAR) 25 MG tablet Take 1 tablet (25 mg total) by mouth daily. 90 tablet 3  . meloxicam (MOBIC) 7.5 MG tablet Take 15 mg by mouth daily.     . nitroGLYCERIN (NITROSTAT) 0.4 MG SL tablet Place 1 tablet (0.4 mg total) under the tongue every 5 (five) minutes as needed for chest pain. (Patient not taking: Reported on 12/04/2016) 25 tablet 3  . oxyCODONE-acetaminophen (PERCOCET) 10-325 MG tablet Take 1 tablet by mouth every 4 (four) hours as needed for pain.     No current facility-administered medications for this visit.       Marland Kitchen  PHYSICAL EXAMINATION: ECOG PERFORMANCE STATUS: 0 - Asymptomatic Vitals:   01/04/17 1136  BP: 120/74  Pulse: 67   Filed Weights   01/04/17 1136  Weight: 238 lb 7 oz (108.2 kg)    GENERAL: No distress, well nourished.  SKIN:  No rashes or significant lesions  HEAD: Normocephalic, No masses, lesions, tenderness or abnormalities  EYES:  Conjunctiva are pink, non icteric ENT: External ears normal ,lips , buccal mucosa, and tongue normal and mucous membranes are moist  LYMPH: No palpable cervical and axillary lymphadenopathy  LUNGS: Clear to auscultation, no crackles or wheezes HEART: Regular rate & rhythm, no murmurs, no gallops, S1 normal and S2 normal  ABDOMEN: Abdomen soft, non-tender, normal bowel sounds, I did not appreciate any  masses or organomegaly  MUSCULOSKELETAL: No CVA tenderness and no tenderness on percussion of the back or rib cage.  EXTREMITIES: No edema, no skin discoloration or tenderness NEURO: Alert & oriented, no focal motor/sensory deficits.  LABORATORY DATA:  I have reviewed the data as listed Lab Results  Component Value Date   WBC 7.8 12/25/2016   HGB 13.3 12/25/2016  HCT 39.0 (L) 12/25/2016   MCV 94.5 12/25/2016   PLT 287 12/25/2016   Recent Labs    11/08/16 1005 12/04/16 1115  NA 141 137  K 4.3 4.7  CL 106 102  CO2 25 25  GLUCOSE 105* 108*  BUN 20 24*  CREATININE 1.10 1.16  CALCIUM 9.8 9.3  GFRNONAA >60 >60  GFRAA >60 >60  PROT  --  8.1  ALBUMIN  --  4.5  AST  --  27  ALT  --  38  ALKPHOS  --  62  BILITOT  --  0.7   ASSESSMENT & PLAN:  1. Hereditary hemochromatosis ([place])   2. Iron overload   3. Black stool    # Iron panel responded well to phlebotomy. Goal ferritin for him prefer to be at least less than 100, preferably less than 50. We'll schedule him for phlebotomy 500 ml blood removal for another 3 times, including today, with holding parametrium of hemoglobin less than 12.5. # discussed with patient to avoid alcohol, avoid uncooked seafood. Otherwise first-degree relatives is to be screened. # Black stools: He is overdue for colonoscopy. We'll refer to GI for colonoscopy. Check CBC, iron TIBC ferratin in 5 weeks. All questions were answered. The patient knows to call the clinic with any problems questions or concerns.  Return of visit: Follow-up in 5 weeks with  labs done ahead of visit.   Earlie Server, MD, PhD Hematology Oncology [facility] at [place] Pager- [phone] 01/04/2017

## 2017-01-04 ENCOUNTER — Inpatient Hospital Stay: Payer: BLUE CROSS/BLUE SHIELD

## 2017-01-04 ENCOUNTER — Other Ambulatory Visit: Payer: Self-pay

## 2017-01-04 ENCOUNTER — Inpatient Hospital Stay (HOSPITAL_BASED_OUTPATIENT_CLINIC_OR_DEPARTMENT_OTHER): Payer: BLUE CROSS/BLUE SHIELD | Admitting: Oncology

## 2017-01-04 ENCOUNTER — Encounter: Payer: Self-pay | Admitting: Oncology

## 2017-01-04 DIAGNOSIS — F329 Major depressive disorder, single episode, unspecified: Secondary | ICD-10-CM | POA: Diagnosis not present

## 2017-01-04 DIAGNOSIS — G4730 Sleep apnea, unspecified: Secondary | ICD-10-CM | POA: Diagnosis not present

## 2017-01-04 DIAGNOSIS — M109 Gout, unspecified: Secondary | ICD-10-CM | POA: Diagnosis not present

## 2017-01-04 DIAGNOSIS — F419 Anxiety disorder, unspecified: Secondary | ICD-10-CM | POA: Diagnosis not present

## 2017-01-04 DIAGNOSIS — K921 Melena: Secondary | ICD-10-CM | POA: Diagnosis not present

## 2017-01-04 DIAGNOSIS — Z7982 Long term (current) use of aspirin: Secondary | ICD-10-CM | POA: Diagnosis not present

## 2017-01-04 DIAGNOSIS — Z79899 Other long term (current) drug therapy: Secondary | ICD-10-CM | POA: Diagnosis not present

## 2017-01-04 DIAGNOSIS — Z96619 Presence of unspecified artificial shoulder joint: Secondary | ICD-10-CM | POA: Diagnosis not present

## 2017-01-04 DIAGNOSIS — I10 Essential (primary) hypertension: Secondary | ICD-10-CM | POA: Diagnosis not present

## 2017-01-04 DIAGNOSIS — E785 Hyperlipidemia, unspecified: Secondary | ICD-10-CM | POA: Diagnosis not present

## 2017-01-04 DIAGNOSIS — Z955 Presence of coronary angioplasty implant and graft: Secondary | ICD-10-CM | POA: Diagnosis not present

## 2017-01-04 NOTE — Progress Notes (Signed)
Patient here today for follow up.  Patient states no new concerns today  

## 2017-01-11 ENCOUNTER — Inpatient Hospital Stay: Payer: BLUE CROSS/BLUE SHIELD

## 2017-01-11 ENCOUNTER — Telehealth: Payer: Self-pay | Admitting: Cardiovascular Disease

## 2017-01-11 DIAGNOSIS — E785 Hyperlipidemia, unspecified: Secondary | ICD-10-CM | POA: Diagnosis not present

## 2017-01-11 DIAGNOSIS — F329 Major depressive disorder, single episode, unspecified: Secondary | ICD-10-CM | POA: Diagnosis not present

## 2017-01-11 DIAGNOSIS — K921 Melena: Secondary | ICD-10-CM | POA: Diagnosis not present

## 2017-01-11 DIAGNOSIS — G4730 Sleep apnea, unspecified: Secondary | ICD-10-CM | POA: Diagnosis not present

## 2017-01-11 DIAGNOSIS — Z96619 Presence of unspecified artificial shoulder joint: Secondary | ICD-10-CM | POA: Diagnosis not present

## 2017-01-11 DIAGNOSIS — Z79899 Other long term (current) drug therapy: Secondary | ICD-10-CM | POA: Diagnosis not present

## 2017-01-11 DIAGNOSIS — F419 Anxiety disorder, unspecified: Secondary | ICD-10-CM | POA: Diagnosis not present

## 2017-01-11 DIAGNOSIS — Z7982 Long term (current) use of aspirin: Secondary | ICD-10-CM | POA: Diagnosis not present

## 2017-01-11 DIAGNOSIS — M109 Gout, unspecified: Secondary | ICD-10-CM | POA: Diagnosis not present

## 2017-01-11 DIAGNOSIS — Z955 Presence of coronary angioplasty implant and graft: Secondary | ICD-10-CM | POA: Diagnosis not present

## 2017-01-11 DIAGNOSIS — I10 Essential (primary) hypertension: Secondary | ICD-10-CM | POA: Diagnosis not present

## 2017-01-11 LAB — CBC
HCT: 38.5 % — ABNORMAL LOW (ref 40.0–52.0)
Hemoglobin: 13 g/dL (ref 13.0–18.0)
MCH: 32.5 pg (ref 26.0–34.0)
MCHC: 33.8 g/dL (ref 32.0–36.0)
MCV: 96 fL (ref 80.0–100.0)
PLATELETS: 287 10*3/uL (ref 150–440)
RBC: 4.01 MIL/uL — ABNORMAL LOW (ref 4.40–5.90)
RDW: 13.8 % (ref 11.5–14.5)
WBC: 7.7 10*3/uL (ref 3.8–10.6)

## 2017-01-11 NOTE — Telephone Encounter (Signed)
Patient here in the lobby. He saw there was a recall on Losartan. I advised patient to contact his pharmacy to see if his Losartan was made by one of the affected manufacturers. He verbalized understanding.

## 2017-01-11 NOTE — Telephone Encounter (Signed)
Patient came by to discuss recall on losartan Please discuss

## 2017-01-18 ENCOUNTER — Inpatient Hospital Stay: Payer: BLUE CROSS/BLUE SHIELD

## 2017-01-18 DIAGNOSIS — Z96619 Presence of unspecified artificial shoulder joint: Secondary | ICD-10-CM | POA: Diagnosis not present

## 2017-01-18 DIAGNOSIS — K921 Melena: Secondary | ICD-10-CM | POA: Diagnosis not present

## 2017-01-18 DIAGNOSIS — E785 Hyperlipidemia, unspecified: Secondary | ICD-10-CM | POA: Diagnosis not present

## 2017-01-18 DIAGNOSIS — F329 Major depressive disorder, single episode, unspecified: Secondary | ICD-10-CM | POA: Diagnosis not present

## 2017-01-18 DIAGNOSIS — I10 Essential (primary) hypertension: Secondary | ICD-10-CM | POA: Diagnosis not present

## 2017-01-18 DIAGNOSIS — F419 Anxiety disorder, unspecified: Secondary | ICD-10-CM | POA: Diagnosis not present

## 2017-01-18 DIAGNOSIS — Z79899 Other long term (current) drug therapy: Secondary | ICD-10-CM | POA: Diagnosis not present

## 2017-01-18 DIAGNOSIS — M109 Gout, unspecified: Secondary | ICD-10-CM | POA: Diagnosis not present

## 2017-01-18 DIAGNOSIS — G4730 Sleep apnea, unspecified: Secondary | ICD-10-CM | POA: Diagnosis not present

## 2017-01-18 DIAGNOSIS — Z7982 Long term (current) use of aspirin: Secondary | ICD-10-CM | POA: Diagnosis not present

## 2017-01-18 DIAGNOSIS — Z955 Presence of coronary angioplasty implant and graft: Secondary | ICD-10-CM | POA: Diagnosis not present

## 2017-01-18 LAB — CBC
HEMATOCRIT: 41.5 % (ref 40.0–52.0)
HEMOGLOBIN: 14 g/dL (ref 13.0–18.0)
MCH: 32.4 pg (ref 26.0–34.0)
MCHC: 33.7 g/dL (ref 32.0–36.0)
MCV: 96.2 fL (ref 80.0–100.0)
Platelets: 303 10*3/uL (ref 150–440)
RBC: 4.31 MIL/uL — AB (ref 4.40–5.90)
RDW: 13.6 % (ref 11.5–14.5)
WBC: 7.3 10*3/uL (ref 3.8–10.6)

## 2017-01-29 ENCOUNTER — Encounter: Payer: Self-pay | Admitting: Gastroenterology

## 2017-01-29 ENCOUNTER — Ambulatory Visit (INDEPENDENT_AMBULATORY_CARE_PROVIDER_SITE_OTHER): Payer: BLUE CROSS/BLUE SHIELD | Admitting: Gastroenterology

## 2017-01-29 DIAGNOSIS — Z1211 Encounter for screening for malignant neoplasm of colon: Secondary | ICD-10-CM | POA: Diagnosis not present

## 2017-01-29 DIAGNOSIS — K7469 Other cirrhosis of liver: Secondary | ICD-10-CM

## 2017-01-29 DIAGNOSIS — Z1212 Encounter for screening for malignant neoplasm of rectum: Secondary | ICD-10-CM | POA: Diagnosis not present

## 2017-01-29 DIAGNOSIS — K769 Liver disease, unspecified: Secondary | ICD-10-CM

## 2017-01-29 DIAGNOSIS — K7400 Hepatic fibrosis, unspecified: Secondary | ICD-10-CM

## 2017-01-29 NOTE — Progress Notes (Signed)
[name] MD, MRCP(U.K) [address]  [place]  [place], [place] [address]  Main: [phone]  Fax: [phone]   Gastroenterology Consultation  Referring Provider:     Arlis Porta., MD Primary Care Physician:  Arlis Porta., MD Primary Gastroenterologist:  Dr. [name]  Reason for Consultation:     Hemochromotosis and colon cancer screening         HPI:   Robert Greer is a 62 y.o. y/o male referred for consultation & management  by Dr. Luan Pulling, Ronelle Nigh., MD.     He has been referred by Dr Tasia Catchings for a colonoscopy. She sees him for an elevated iron level. positive for compound heterozygous with C2 82Y/H63D. He undergoes phlebotomy with her.    Last colonoscopy was 14 years back , Denies any prior polyps, no family history of colon cancer or polyps. Had one episode of black colored stools one Symonette - tarry black. No abdominal pain. Since that isolated episode has had none. Denies NSAID use, no change in shape or nature of bowel habits, +     Past Medical History:  Diagnosis Date  . Anxiety   . Arthritis   . Backache, unspecified   . Depressive disorder, not elsewhere classified   . Gout   . Hyperlipidemia   . Hypertension   . Measles   . Mobitz (type) II atrioventricular block   . Mumps   . Other hemochromatosis 12/05/2016  . Sleep apnea     Past Surgical History:  Procedure Laterality Date  . ANTERIOR FUSION LUMBAR SPINE    . BACK SURGERY  2012   x2  . CARDIAC CATHETERIZATION N/A 01/10/2015   Procedure: Left Heart Cath and Coronary Angiography;  Surgeon: Wellington Hampshire, MD;  Location: [facility];  Service: Cardiovascular;  Laterality: N/A;  . CARDIAC CATHETERIZATION N/A 01/10/2015   Procedure: Coronary Stent Intervention;  Surgeon: Wellington Hampshire, MD;  Location: [facility];  Service: Cardiovascular;  Laterality: N/A;  . COLONOSCOPY  2005  . KNEE SURGERY     bilateral  . TONSILLECTOMY AND ADENOIDECTOMY    . TOTAL  SHOULDER REPLACEMENT Right     Prior to Admission medications   Medication Sig Start Date End Date Taking? Authorizing Provider  Docusate Sodium 100 MG capsule Take 100 mg by mouth. 01/25/17 04/25/17 Yes [provider]  allopurinol (ZYLOPRIM) 100 MG tablet Take 100 mg by mouth daily.    [provider]  ALPRAZolam Duanne Moron) 0.5 MG tablet Take 0.5 mg by mouth daily. 01/03/17   [provider]  aspirin 81 MG chewable tablet Chew 1 tablet (81 mg total) by mouth daily. 01/11/15   Aldean Jewett, MD  atorvastatin (LIPITOR) 20 MG tablet Take 1 tablet (20 mg total) by mouth daily. 01/19/16   Minna Merritts, MD  busPIRone (BUSPAR) 10 MG tablet  01/21/17   [provider]  cetirizine (ZYRTEC) 10 MG tablet Take 10 mg by mouth as needed for allergies. Reported on 02/09/2015    [provider]  clopidogrel (PLAVIX) 75 MG tablet Take 1 tablet (75 mg total) by mouth daily. 09/10/16   Minna Merritts, MD  colchicine 0.6 MG tablet Take 0.6 mg by mouth. 09/07/15 09/10/16  [provider]  DULoxetine (CYMBALTA) 20 MG capsule Take 40 mg by mouth daily.     [provider]  DULoxetine (CYMBALTA) 60 MG capsule  01/21/17   [provider]  ezetimibe (ZETIA) 10 MG tablet Take 1 tablet (10 mg total) by mouth daily. 10/02/16   Minna Merritts, MD  losartan (COZAAR) 25 MG tablet Take 1 tablet (25 mg total) by mouth daily. 10/30/16 01/28/17  Theora Gianotti, NP  meloxicam (MOBIC) 7.5 MG tablet Take 15 mg by mouth daily.     [provider]  nitroGLYCERIN (NITROSTAT) 0.4 MG SL tablet Place 1 tablet (0.4 mg total) under the tongue every 5 (five) minutes as needed for chest pain. 01/11/15   Minna Merritts, MD  oxyCODONE-acetaminophen (PERCOCET) 10-325 MG tablet Take 1 tablet by mouth every 4 (four) hours as needed for pain.    [provider]    Family History  Problem Relation Age of Onset  . Stroke Mother   . Liver  disease Father   . Lung cancer Sister   . Diabetes Sister   . Liver disease Brother   . Lung cancer Paternal Grandmother      Social History   Tobacco Use  . Smoking status: Never Smoker  . Smokeless tobacco: Current User    Types: Snuff  Substance Use Topics  . Alcohol use: Yes    Alcohol/week: 1.2 - 1.8 oz    Types: 2 - 3 Cans of beer per week    Comment: ocassionally  . Drug use: No    Allergies as of 01/29/2017  . (No Known Allergies)    Review of Systems:    All systems reviewed and negative except where noted in HPI.   Physical Exam:  BP 138/76 (BP Location: Left Arm, Patient Position: Sitting, Cuff Size: Large)   Pulse 76   Temp 98.5 F (36.9 C) (Oral)   Ht 5' 10.5" (1.791 m)   Wt 238 lb 12.8 oz (108.3 kg)   BMI 33.78 kg/m  No LMP for male patient. Psych:  Alert and cooperative. Normal mood and affect. General:   Alert,  Well-developed, well-nourished, pleasant and cooperative in NAD Head:  Normocephalic and atraumatic. Eyes:  Sclera clear, no icterus.   Conjunctiva pink. Ears:  Normal auditory acuity. Nose:  No deformity, discharge, or lesions. Mouth:  No deformity or lesions,oropharynx pink & moist. Neck:  Supple; no masses or thyromegaly. Lungs:  Respirations even and unlabored.  Clear throughout to auscultation.   No wheezes, crackles, or rhonchi. No acute distress.few spider angiomas over chest  Heart:  Regular rate and rhythm; no murmurs, clicks, rubs, or gallops. Abdomen:  Normal bowel sounds.  No bruits.  Soft, non-tender and non-distended without masses, hepatosplenomegaly or hernias noted.  No guarding or rebound tenderness.    Msk:  Symmetrical without gross deformities. Good, equal movement & strength bilaterally. Pulses:  Normal pulses noted. Extremities:  No clubbing or edema.  No cyanosis. Neurologic:  Alert and oriented x3;  grossly normal neurologically. Skin:  Intact without significant lesions or rashes. No jaundice. Lymph Nodes:  No  significant cervical adenopathy. Psych:  Alert and cooperative. Normal mood and affect.  Imaging Studies: No results found.  Assessment and Plan:   Robert Greer is a 62 y.o. y/o male has been referred for Hemochromatosis and colon cancer screening .He is a C282Y/H63Dcompound heterozygote genotype and they usually have a milder form of disease . Would be interesting to see if his elevated glucose levels improve with phlebotomy. No biochemical evidence of liver cirrhosis.   Plan 1. Suggest  limit the intake of ethanol, avoid taking iron or vitamin C supplements, and avoid ingestion of uncooked  seafood due to risk of vibrio vulnificus infection  2. Screen for immunity for hepatitis A/B, r/o hepatitis C. Check INR . Will need annual flu shot, pneumonia , Tdap vaccine with Luan Pulling Ronelle Nigh., MD . IF not immune to hepA/B will need to be immunized.  3. EGD+colonoscopy for black tarry stool and colorectal cancer screening  4. Consider screening of family members for hemochromatosis.  5. Liver elastography to determine degree of liver fibrosis - he has spider angiomas of chest and may have chronic liver disease.     I have discussed alternative options, risks & benefits,  which include, but are not limited to, bleeding, infection, perforation,respiratory complication & drug reaction.  The patient agrees with this plan & written consent will be obtained.     Follow up in 6 months   Dr [name] MD,MRCP(U.K)

## 2017-01-30 ENCOUNTER — Telehealth: Payer: Self-pay | Admitting: Cardiovascular Disease

## 2017-01-30 ENCOUNTER — Other Ambulatory Visit: Payer: Self-pay

## 2017-01-30 DIAGNOSIS — K769 Liver disease, unspecified: Secondary | ICD-10-CM

## 2017-01-30 MED ORDER — NA SULFATE-K SULFATE-MG SULF 17.5-3.13-1.6 GM/177ML PO SOLN: 1.0000 | Freq: Once | ORAL | 0 refills | Status: AC

## 2017-01-30 NOTE — Addendum Note (Signed)
Addended by: Peggye Ley on: 01/30/2017 03:04 PM   Modules accepted: Orders

## 2017-01-30 NOTE — Telephone Encounter (Signed)
Routing to Dr Gollan. 

## 2017-01-30 NOTE — Addendum Note (Signed)
Addended by: Peggye Ley on: 01/30/2017 03:01 PM   Modules accepted: Orders

## 2017-01-30 NOTE — Addendum Note (Signed)
Addended by: Peggye Ley on: 01/30/2017 02:41 PM   Modules accepted: Orders, SmartSet

## 2017-01-30 NOTE — Telephone Encounter (Signed)
° °  [facility] Pre-operative Risk Assessment    Request for surgical clearance:  1. What type of surgery is being performed? Colonscopy  2. When is this surgery scheduled? 02/14/17   3. Are there any medications that need to be held prior to surgery and how long? Plavix   4. Practice name and name of physician performing surgery? Dr [name]   5. What is your office phone and fax number? Phone [phone] and Fax [phone]  6. Anesthesia type (None, local, MAC, general) ? Not listed    Placed in nurses Bin_________________________________________________

## 2017-01-31 NOTE — Telephone Encounter (Signed)
Acceptable risk to hold plavix for 5 days prior to procedure Stay on aspirin Restart plavix after procedure as directed by surgeon

## 2017-01-31 NOTE — Telephone Encounter (Signed)
Clearance routed to number provided.  

## 2017-02-04 ENCOUNTER — Telehealth: Payer: Self-pay

## 2017-02-04 NOTE — Telephone Encounter (Signed)
LVM for patient callback x 2.   Patient needs Plavix clearance information from Dr. Rockey Situ.   Attempted to call home phone. Line was busy.

## 2017-02-05 ENCOUNTER — Telehealth: Payer: Self-pay

## 2017-02-05 NOTE — Telephone Encounter (Signed)
Advised patient of HCV Fibrosure lab due to insurance not paying for Liver Elastography.   Calling the "[place] doctor" to add lab test. [phone]).

## 2017-02-06 ENCOUNTER — Inpatient Hospital Stay: Payer: BLUE CROSS/BLUE SHIELD

## 2017-02-06 ENCOUNTER — Telehealth: Payer: Self-pay | Admitting: Gastroenterology

## 2017-02-06 NOTE — Telephone Encounter (Signed)
Patient left a voice message that he has a medication question. Please call

## 2017-02-07 ENCOUNTER — Inpatient Hospital Stay: Payer: BLUE CROSS/BLUE SHIELD | Attending: Oncology

## 2017-02-07 DIAGNOSIS — F419 Anxiety disorder, unspecified: Secondary | ICD-10-CM | POA: Insufficient documentation

## 2017-02-07 DIAGNOSIS — D649 Anemia, unspecified: Secondary | ICD-10-CM | POA: Diagnosis not present

## 2017-02-07 DIAGNOSIS — I10 Essential (primary) hypertension: Secondary | ICD-10-CM | POA: Diagnosis not present

## 2017-02-07 DIAGNOSIS — Z7982 Long term (current) use of aspirin: Secondary | ICD-10-CM | POA: Insufficient documentation

## 2017-02-07 DIAGNOSIS — F329 Major depressive disorder, single episode, unspecified: Secondary | ICD-10-CM | POA: Insufficient documentation

## 2017-02-07 DIAGNOSIS — Z955 Presence of coronary angioplasty implant and graft: Secondary | ICD-10-CM | POA: Diagnosis not present

## 2017-02-07 DIAGNOSIS — Z96619 Presence of unspecified artificial shoulder joint: Secondary | ICD-10-CM | POA: Diagnosis not present

## 2017-02-07 DIAGNOSIS — Z79899 Other long term (current) drug therapy: Secondary | ICD-10-CM | POA: Insufficient documentation

## 2017-02-07 DIAGNOSIS — G4730 Sleep apnea, unspecified: Secondary | ICD-10-CM | POA: Insufficient documentation

## 2017-02-07 DIAGNOSIS — M109 Gout, unspecified: Secondary | ICD-10-CM | POA: Diagnosis not present

## 2017-02-07 DIAGNOSIS — E785 Hyperlipidemia, unspecified: Secondary | ICD-10-CM | POA: Insufficient documentation

## 2017-02-07 LAB — CBC
HCT: 42.1 % (ref 40.0–52.0)
HEMOGLOBIN: 14 g/dL (ref 13.0–18.0)
MCH: 32.2 pg (ref 26.0–34.0)
MCHC: 33.3 g/dL (ref 32.0–36.0)
MCV: 96.8 fL (ref 80.0–100.0)
PLATELETS: 310 10*3/uL (ref 150–440)
RBC: 4.34 MIL/uL — AB (ref 4.40–5.90)
RDW: 13.3 % (ref 11.5–14.5)
WBC: 8 10*3/uL (ref 3.8–10.6)

## 2017-02-07 NOTE — Telephone Encounter (Signed)
Patient wants to know if he should stop taking asa 81 mg po q [place] as well

## 2017-02-07 NOTE — Telephone Encounter (Signed)
S/w patient. He verbalized understanding to stop Plavix 5 days prior and to stay on the aspirin. He is aware not to restart plavix until after procedure as directed by the surgeon.

## 2017-02-07 NOTE — Progress Notes (Signed)
Hematology/Oncology Follow up note [facility] [phone] [phone]   Patient Care Team: Arlis Porta., MD as PCP - General (Unknown Physician Specialty)  REFERRING PROVIDER: Dr.Rabinowitz PURPOSE OF CONSULTATION:  hemochromotosis  HISTORY OF PRESENTING ILLNESS:  Robert Greer is a  62 y.o.  male with PMH listed below who was referred to me for evaluation of  hemochromatosis. Patient is a Airline pilot. Patient was seen at [facility] health clinic. He had some lab work done. His iron has been persistently elevated. Most recent serum iron level is 196. Ferritin is elevated at 510. Hemochromatosis mutation PCR was checked. Patient is positive for compound heterozygous with C2 82Y/H63D. Patient also has mild elevation of transaminitis ALT is elevated at 47, normal AST 32. LDH 207. Creatinine 1.07. TSH is elevated at 5.6 hemoglobin 15, platelet 273, WBC 7.8. Elevated eosinophil count on differential at 0.5. Rest differential was normal.   INTERVAL HISTORY Robert Greer is a 62 y.o. male who has above history reviewed by me today presents for follow up visit for management of hemochromatosis. He has had 3 phlebotomy during interval. He tolerates procedure well without feeling fatigue or dizziness. Patient also has been seen and evaluated by Dr. Vicente Males, recommending EGD and colonoscopy for evaluation of black tarry stool  And colorectal cancer screening. He has had lab work done with his primary care physician on 01/21/2017. Hemoglobin 12.7, hematocrit 37.8, MCV 97, platelet count 303,000, WBC 8.3, eosinophil 0.5 breast differential normal. Normal bilirubin, normal AST and ALT. Ferritin level was 174.   Review of Systems  Constitutional: Negative for chills, diaphoresis and fever.  HENT: Negative for hearing loss.   Eyes: Negative for blurred vision and discharge.  Cardiovascular: Negative for chest pain.  Gastrointestinal:  Positive for melena. Negative for diarrhea and heartburn.  Genitourinary: Negative for dysuria and frequency.  Skin: Negative for rash.  Neurological: Negative for dizziness and tingling.  Endo/Heme/Allergies: Does not bruise/bleed easily.  Psychiatric/Behavioral: Negative for depression. The patient is not nervous/anxious.    MEDICAL HISTORY:  Past Medical History:  Diagnosis Date  . Anxiety   . Arthritis   . Backache, unspecified   . Depressive disorder, not elsewhere classified   . Gout   . Hyperlipidemia   . Hypertension   . Measles   . Mobitz (type) II atrioventricular block   . Mumps   . Other hemochromatosis 12/05/2016  . Sleep apnea     SURGICAL HISTORY: Past Surgical History:  Procedure Laterality Date  . ANTERIOR FUSION LUMBAR SPINE    . BACK SURGERY  2012   x2  . CARDIAC CATHETERIZATION N/A 01/10/2015   Procedure: Left Heart Cath and Coronary Angiography;  Surgeon: Wellington Hampshire, MD;  Location: [facility];  Service: Cardiovascular;  Laterality: N/A;  . CARDIAC CATHETERIZATION N/A 01/10/2015   Procedure: Coronary Stent Intervention;  Surgeon: Wellington Hampshire, MD;  Location: [facility];  Service: Cardiovascular;  Laterality: N/A;  . COLONOSCOPY  2005  . KNEE SURGERY     bilateral  . TONSILLECTOMY AND ADENOIDECTOMY    . TOTAL SHOULDER REPLACEMENT Right     SOCIAL HISTORY: Social History   Socioeconomic History  . Marital status: Married    Spouse name: Not on file  . Number of children: Not on file  . Years of education: Not on file  . Highest education level: Not on file  Social Needs  . Financial resource strain: Not on file  .  Food insecurity - worry: Not on file  . Food insecurity - inability: Not on file  . Transportation needs - medical: Not on file  . Transportation needs - non-medical: Not on file  Occupational History  . Not on file  Tobacco Use  . Smoking status: Never Smoker  . Smokeless tobacco: Current User     Types: Snuff  Substance and Sexual Activity  . Alcohol use: Yes    Alcohol/week: 1.2 - 1.8 oz    Types: 2 - 3 Cans of beer per week    Comment: ocassionally  . Drug use: No  . Sexual activity: Yes  Other Topics Concern  . Not on file  Social History Narrative  . Not on file    FAMILY HISTORY: Family History  Problem Relation Age of Onset  . Stroke Mother   . Liver disease Father   . Lung cancer Sister   . Diabetes Sister   . Liver disease Brother   . Lung cancer Paternal Grandmother     ALLERGIES:  has No Known Allergies.  MEDICATIONS:  Current Outpatient Medications  Medication Sig Dispense Refill  . allopurinol (ZYLOPRIM) 100 MG tablet Take 100 mg by mouth daily.    Marland Kitchen ALPRAZolam (XANAX) 0.5 MG tablet Take 0.5 mg by mouth daily.  0  . aspirin 81 MG chewable tablet Chew 1 tablet (81 mg total) by mouth daily. 30 tablet 3  . atorvastatin (LIPITOR) 20 MG tablet Take 1 tablet (20 mg total) by mouth daily. 90 tablet 3  . busPIRone (BUSPAR) 10 MG tablet   1  . cetirizine (ZYRTEC) 10 MG tablet Take 10 mg by mouth as needed for allergies. Reported on 02/09/2015    . colchicine 0.6 MG tablet Take 0.6 mg by mouth.    Mariane Baumgarten Sodium 100 MG capsule Take 100 mg by mouth.    . DULoxetine (CYMBALTA) 60 MG capsule   1  . ezetimibe (ZETIA) 10 MG tablet Take 1 tablet (10 mg total) by mouth daily. 30 tablet 5  . losartan (COZAAR) 25 MG tablet Take 1 tablet (25 mg total) by mouth daily. 90 tablet 3  . meloxicam (MOBIC) 7.5 MG tablet Take 15 mg by mouth daily.     . nitroGLYCERIN (NITROSTAT) 0.4 MG SL tablet Place 1 tablet (0.4 mg total) under the tongue every 5 (five) minutes as needed for chest pain. 25 tablet 3  . oxyCODONE-acetaminophen (PERCOCET) 10-325 MG tablet Take 1 tablet by mouth every 4 (four) hours as needed for pain.    Marland Kitchen clopidogrel (PLAVIX) 75 MG tablet Take 1 tablet (75 mg total) by mouth daily. (Patient not taking: Reported on 02/08/2017) 90 tablet 3   No current  facility-administered medications for this visit.       Marland Kitchen  PHYSICAL EXAMINATION: ECOG PERFORMANCE STATUS: 0 - Asymptomatic Vitals:   02/08/17 1356 02/08/17 1402  BP:  111/73  Pulse:  78  Resp: 16   Temp:  97.9 F (36.6 C)   Filed Weights   02/08/17 1356  Weight: 240 lb 11.2 oz (109.2 kg)    GENERAL: No distress, well nourished.  SKIN:  No rashes or significant lesions  HEAD: Normocephalic, No masses, lesions, tenderness or abnormalities  EYES: Conjunctiva are pink, non icteric ENT: External ears normal ,lips , buccal mucosa, and tongue normal and mucous membranes are moist  LYMPH: No palpable cervical and axillary lymphadenopathy  LUNGS: Clear to auscultation, no crackles or wheezes HEART: Regular rate & rhythm, no murmurs,  no gallops, S1 normal and S2 normal  ABDOMEN: Abdomen soft, non-tender, normal bowel sounds, I did not appreciate any  masses or organomegaly  MUSCULOSKELETAL: No CVA tenderness and no tenderness on percussion of the back or rib cage.  EXTREMITIES: No edema, no skin discoloration or tenderness NEURO: Alert & oriented, no focal motor/sensory deficits.   LABORATORY DATA:  I have reviewed the data as listed Lab Results  Component Value Date   WBC 8.0 02/07/2017   HGB 14.0 02/07/2017   HCT 42.1 02/07/2017   MCV 96.8 02/07/2017   PLT 310 02/07/2017   Recent Labs    11/08/16 1005 12/04/16 1115  NA 141 137  K 4.3 4.7  CL 106 102  CO2 25 25  GLUCOSE 105* 108*  BUN 20 24*  CREATININE 1.10 1.16  CALCIUM 9.8 9.3  GFRNONAA >60 >60  GFRAA >60 >60  PROT  --  8.1  ALBUMIN  --  4.5  AST  --  27  ALT  --  38  ALKPHOS  --  62  BILITOT  --  0.7   ASSESSMENT & PLAN:  1. Hereditary hemochromatosis ([place])   2. Iron overload    # Iron panel responded well to phlebotomy.  most recent ferritin done at primary care's office was 174.  Goal ferritin for him prefer to be at least less than 100, preferably less than 50. We'll schedule him for phlebotomy  500 ml blood removal for another 3 times, including today, with holding parametrium of hemoglobin less than 12.5. Mild anemia is acceptable. Discussed with patient.  We will enroll patient to our phlebotomy program. # discussed with patient to avoid alcohol, avoid uncooked seafood. Otherwise first-degree relatives is to be screened. # Black stools:  follow-up with GI for EGD and colonoscopy.  Check  CBC, iron TIBC ferratin in 5 weeks. All questions were answered. The patient knows to call the clinic with any problems questions or concerns.  Return of visit: Follow-up in 5 weeks with labs done ahead of visit.   Earlie Server, MD, PhD Hematology Oncology [facility] Pager- [phone] 02/08/2017

## 2017-02-08 ENCOUNTER — Inpatient Hospital Stay: Payer: BLUE CROSS/BLUE SHIELD

## 2017-02-08 ENCOUNTER — Encounter: Payer: Self-pay | Admitting: Oncology

## 2017-02-08 ENCOUNTER — Inpatient Hospital Stay (HOSPITAL_BASED_OUTPATIENT_CLINIC_OR_DEPARTMENT_OTHER): Payer: BLUE CROSS/BLUE SHIELD | Admitting: Oncology

## 2017-02-08 ENCOUNTER — Other Ambulatory Visit: Payer: Self-pay

## 2017-02-08 DIAGNOSIS — D649 Anemia, unspecified: Secondary | ICD-10-CM

## 2017-02-08 DIAGNOSIS — I10 Essential (primary) hypertension: Secondary | ICD-10-CM | POA: Diagnosis not present

## 2017-02-08 DIAGNOSIS — Z955 Presence of coronary angioplasty implant and graft: Secondary | ICD-10-CM | POA: Diagnosis not present

## 2017-02-08 DIAGNOSIS — G4730 Sleep apnea, unspecified: Secondary | ICD-10-CM | POA: Diagnosis not present

## 2017-02-08 DIAGNOSIS — F329 Major depressive disorder, single episode, unspecified: Secondary | ICD-10-CM | POA: Diagnosis not present

## 2017-02-08 DIAGNOSIS — Z79899 Other long term (current) drug therapy: Secondary | ICD-10-CM | POA: Diagnosis not present

## 2017-02-08 DIAGNOSIS — Z96619 Presence of unspecified artificial shoulder joint: Secondary | ICD-10-CM | POA: Diagnosis not present

## 2017-02-08 DIAGNOSIS — Z7982 Long term (current) use of aspirin: Secondary | ICD-10-CM | POA: Diagnosis not present

## 2017-02-08 DIAGNOSIS — E785 Hyperlipidemia, unspecified: Secondary | ICD-10-CM | POA: Diagnosis not present

## 2017-02-08 DIAGNOSIS — F419 Anxiety disorder, unspecified: Secondary | ICD-10-CM | POA: Diagnosis not present

## 2017-02-08 DIAGNOSIS — M109 Gout, unspecified: Secondary | ICD-10-CM | POA: Diagnosis not present

## 2017-02-08 NOTE — Progress Notes (Signed)
No changes since last appoinment.

## 2017-02-14 ENCOUNTER — Ambulatory Visit: Payer: BLUE CROSS/BLUE SHIELD | Admitting: Registered Nurse

## 2017-02-14 ENCOUNTER — Ambulatory Visit
Admission: RE | Admit: 2017-02-14 | Discharge: 2017-02-14 | Disposition: A | Discharge status: Home / Self Care | Payer: BLUE CROSS/BLUE SHIELD | Source: Ambulatory Visit | Attending: Gastroenterology | Admitting: Gastroenterology

## 2017-02-14 ENCOUNTER — Encounter
Admission: RE | Disposition: A | Discharge status: Home / Self Care | Payer: Self-pay | Source: Ambulatory Visit | Attending: Gastroenterology

## 2017-02-14 ENCOUNTER — Encounter: Payer: Self-pay | Admitting: Anesthesiology

## 2017-02-14 ENCOUNTER — Other Ambulatory Visit: Payer: Self-pay

## 2017-02-14 DIAGNOSIS — M109 Gout, unspecified: Secondary | ICD-10-CM | POA: Insufficient documentation

## 2017-02-14 DIAGNOSIS — I441 Atrioventricular block, second degree: Secondary | ICD-10-CM | POA: Diagnosis not present

## 2017-02-14 DIAGNOSIS — Z955 Presence of coronary angioplasty implant and graft: Secondary | ICD-10-CM | POA: Diagnosis not present

## 2017-02-14 DIAGNOSIS — E785 Hyperlipidemia, unspecified: Secondary | ICD-10-CM | POA: Insufficient documentation

## 2017-02-14 DIAGNOSIS — G4730 Sleep apnea, unspecified: Secondary | ICD-10-CM | POA: Diagnosis not present

## 2017-02-14 DIAGNOSIS — I252 Old myocardial infarction: Secondary | ICD-10-CM | POA: Insufficient documentation

## 2017-02-14 DIAGNOSIS — Z7902 Long term (current) use of antithrombotics/antiplatelets: Secondary | ICD-10-CM | POA: Insufficient documentation

## 2017-02-14 DIAGNOSIS — Z720 Tobacco use: Secondary | ICD-10-CM | POA: Insufficient documentation

## 2017-02-14 DIAGNOSIS — Z981 Arthrodesis status: Secondary | ICD-10-CM | POA: Diagnosis not present

## 2017-02-14 DIAGNOSIS — F419 Anxiety disorder, unspecified: Secondary | ICD-10-CM | POA: Insufficient documentation

## 2017-02-14 DIAGNOSIS — F329 Major depressive disorder, single episode, unspecified: Secondary | ICD-10-CM | POA: Diagnosis not present

## 2017-02-14 DIAGNOSIS — K921 Melena: Secondary | ICD-10-CM

## 2017-02-14 DIAGNOSIS — K2950 Unspecified chronic gastritis without bleeding: Secondary | ICD-10-CM | POA: Diagnosis not present

## 2017-02-14 DIAGNOSIS — Z79899 Other long term (current) drug therapy: Secondary | ICD-10-CM | POA: Diagnosis not present

## 2017-02-14 DIAGNOSIS — K2960 Other gastritis without bleeding: Secondary | ICD-10-CM | POA: Diagnosis not present

## 2017-02-14 DIAGNOSIS — Z1212 Encounter for screening for malignant neoplasm of rectum: Secondary | ICD-10-CM

## 2017-02-14 DIAGNOSIS — Z1211 Encounter for screening for malignant neoplasm of colon: Secondary | ICD-10-CM | POA: Insufficient documentation

## 2017-02-14 DIAGNOSIS — I2510 Atherosclerotic heart disease of native coronary artery without angina pectoris: Secondary | ICD-10-CM | POA: Diagnosis not present

## 2017-02-14 DIAGNOSIS — Z7982 Long term (current) use of aspirin: Secondary | ICD-10-CM | POA: Diagnosis not present

## 2017-02-14 DIAGNOSIS — I10 Essential (primary) hypertension: Secondary | ICD-10-CM | POA: Diagnosis not present

## 2017-02-14 DIAGNOSIS — I739 Peripheral vascular disease, unspecified: Secondary | ICD-10-CM | POA: Diagnosis not present

## 2017-02-14 DIAGNOSIS — K2970 Gastritis, unspecified, without bleeding: Secondary | ICD-10-CM

## 2017-02-14 DIAGNOSIS — M1990 Unspecified osteoarthritis, unspecified site: Secondary | ICD-10-CM | POA: Insufficient documentation

## 2017-02-14 DIAGNOSIS — Z9989 Dependence on other enabling machines and devices: Secondary | ICD-10-CM | POA: Diagnosis not present

## 2017-02-14 HISTORY — PX: COLONOSCOPY WITH PROPOFOL: SHX5780

## 2017-02-14 HISTORY — PX: ESOPHAGOGASTRODUODENOSCOPY (EGD) WITH PROPOFOL: SHX5813

## 2017-02-14 SURGERY — ESOPHAGOGASTRODUODENOSCOPY (EGD) WITH PROPOFOL
Anesthesia: General

## 2017-02-14 MED ORDER — PHENYLEPHRINE HCL 10 MG/ML IJ SOLN
INTRAMUSCULAR | Status: AC
  Filled 2017-02-14: qty 1

## 2017-02-14 MED ORDER — PROPOFOL 10 MG/ML IV BOLUS
INTRAVENOUS | Status: DC | PRN
  Administered 2017-02-14: 100 mg via INTRAVENOUS
  Administered 2017-02-14: 20 mg via INTRAVENOUS

## 2017-02-14 MED ORDER — PROPOFOL 500 MG/50ML IV EMUL
INTRAVENOUS | Status: DC | PRN
  Administered 2017-02-14: 150 ug/kg/min via INTRAVENOUS

## 2017-02-14 MED ORDER — MIDAZOLAM HCL 2 MG/2ML IJ SOLN
INTRAMUSCULAR | Status: AC
  Filled 2017-02-14: qty 2

## 2017-02-14 MED ORDER — SUCCINYLCHOLINE CHLORIDE 20 MG/ML IJ SOLN
INTRAMUSCULAR | Status: AC
  Filled 2017-02-14: qty 1

## 2017-02-14 MED ORDER — FENTANYL CITRATE (PF) 100 MCG/2ML IJ SOLN
INTRAMUSCULAR | Status: AC
  Filled 2017-02-14: qty 2

## 2017-02-14 MED ORDER — MIDAZOLAM HCL 2 MG/2ML IJ SOLN
INTRAMUSCULAR | Status: DC | PRN
  Administered 2017-02-14: 2 mg via INTRAVENOUS

## 2017-02-14 MED ORDER — PHENYLEPHRINE HCL 10 MG/ML IJ SOLN
INTRAMUSCULAR | Status: DC | PRN
  Administered 2017-02-14 (×4): 100 ug via INTRAVENOUS

## 2017-02-14 MED ORDER — LIDOCAINE HCL (CARDIAC) 20 MG/ML IV SOLN
INTRAVENOUS | Status: DC | PRN
  Administered 2017-02-14: 40 mg via INTRAVENOUS

## 2017-02-14 MED ORDER — GLYCOPYRROLATE 0.2 MG/ML IJ SOLN
INTRAMUSCULAR | Status: AC
  Filled 2017-02-14: qty 1

## 2017-02-14 MED ORDER — PROPOFOL 500 MG/50ML IV EMUL
INTRAVENOUS | Status: AC
  Filled 2017-02-14: qty 50

## 2017-02-14 MED ORDER — FENTANYL CITRATE (PF) 100 MCG/2ML IJ SOLN
INTRAMUSCULAR | Status: DC | PRN
  Administered 2017-02-14: 25 ug via INTRAVENOUS

## 2017-02-14 MED ORDER — SODIUM CHLORIDE 0.9 % IV SOLN
INTRAVENOUS | Status: DC
  Administered 2017-02-14: 08:00:00 via INTRAVENOUS

## 2017-02-14 MED ORDER — GLYCOPYRROLATE 0.2 MG/ML IJ SOLN
INTRAMUSCULAR | Status: DC | PRN
  Administered 2017-02-14: 0.2 mg via INTRAVENOUS

## 2017-02-14 NOTE — H&P (Signed)
Robert Bellows, MD [address], [place], [place], [place], [address] [id number], [place], [place], [place], [address] Phone: [phone]  Fax: [phone]  Primary Care Physician:  Arlis Porta., MD   Pre-Procedure History & Physical: HPI:  Robert Greer is a 62 y.o. male is here for an endoscopy and colonoscopy    Past Medical History:  Diagnosis Date  . Anxiety   . Arthritis   . Backache, unspecified   . Depressive disorder, not elsewhere classified   . Gout   . Hyperlipidemia   . Hypertension   . Measles   . Mobitz (type) II atrioventricular block   . Mumps   . Other hemochromatosis 12/05/2016  . Sleep apnea     Past Surgical History:  Procedure Laterality Date  . ANTERIOR FUSION LUMBAR SPINE    . BACK SURGERY  2012   x2  . CARDIAC CATHETERIZATION N/A 01/10/2015   Procedure: Left Heart Cath and Coronary Angiography;  Surgeon: Robert Hampshire, MD;  Location: [facility];  Service: Cardiovascular;  Laterality: N/A;  . CARDIAC CATHETERIZATION N/A 01/10/2015   Procedure: Coronary Stent Intervention;  Surgeon: Robert Hampshire, MD;  Location: [facility];  Service: Cardiovascular;  Laterality: N/A;  . COLONOSCOPY  2005  . KNEE SURGERY     bilateral  . TONSILLECTOMY AND ADENOIDECTOMY    . TOTAL SHOULDER REPLACEMENT Right     Prior to Admission medications   Medication Sig Start Date End Date Taking? Authorizing Provider  allopurinol (ZYLOPRIM) 100 MG tablet Take 100 mg by mouth daily.   Yes [provider]  aspirin 81 MG chewable tablet Chew 1 tablet (81 mg total) by mouth daily. 01/11/15  Yes Aldean Jewett, MD  atorvastatin (LIPITOR) 20 MG tablet Take 1 tablet (20 mg total) by mouth daily. 01/19/16  Yes Minna Merritts, MD  clopidogrel (PLAVIX) 75 MG tablet Take 1 tablet (75 mg total) by mouth daily. 09/10/16  Yes Minna Merritts, MD  DULoxetine (CYMBALTA) 60 MG capsule  01/21/17  Yes [provider]    ezetimibe (ZETIA) 10 MG tablet Take 1 tablet (10 mg total) by mouth daily. 10/02/16  Yes Gollan, Kathlene November, MD  losartan (COZAAR) 25 MG tablet Take 1 tablet (25 mg total) by mouth daily. 10/30/16 02/14/17 Yes Theora Gianotti, NP  meloxicam (MOBIC) 7.5 MG tablet Take 15 mg by mouth daily.    Yes [provider]  oxyCODONE-acetaminophen (PERCOCET) 10-325 MG tablet Take 1 tablet by mouth every 4 (four) hours as needed for pain.   Yes [provider]  ALPRAZolam Duanne Moron) 0.5 MG tablet Take 0.5 mg by mouth daily. 01/03/17   [provider]  busPIRone (BUSPAR) 10 MG tablet  01/21/17   [provider]  cetirizine (ZYRTEC) 10 MG tablet Take 10 mg by mouth as needed for allergies. Reported on 02/09/2015    [provider]  colchicine 0.6 MG tablet Take 0.6 mg by mouth. 09/07/15 02/08/17  [provider]  Docusate Sodium 100 MG capsule Take 100 mg by mouth. 01/25/17 04/25/17  [provider]  nitroGLYCERIN (NITROSTAT) 0.4 MG SL tablet Place 1 tablet (0.4 mg total) under the tongue every 5 (five) minutes as needed for chest pain. Patient not taking: Reported on 02/14/2017 01/11/15   Minna Merritts, MD    Allergies as of 01/30/2017  . (No Known Allergies)    Family History  Problem Relation Age of  Onset  . Stroke Mother   . Liver disease Father   . Lung cancer Sister   . Diabetes Sister   . Liver disease Brother   . Lung cancer Paternal Grandmother     Social History   Socioeconomic History  . Marital status: Married    Spouse name: Not on file  . Number of children: Not on file  . Years of education: Not on file  . Highest education level: Not on file  Social Needs  . Financial resource strain: Not on file  . Food insecurity - worry: Not on file  . Food insecurity - inability: Not on file  . Transportation needs - medical: Not on file  . Transportation needs - non-medical: Not on file  Occupational History  . Not on file   Tobacco Use  . Smoking status: Never Smoker  . Smokeless tobacco: Current User    Types: Snuff  Substance and Sexual Activity  . Alcohol use: Yes    Alcohol/week: 1.2 - 1.8 oz    Types: 2 - 3 Cans of beer per week    Comment: ocassionally  . Drug use: No  . Sexual activity: Yes  Other Topics Concern  . Not on file  Social History Narrative  . Not on file    Review of Systems: See HPI, otherwise negative ROS  Physical Exam: BP (!) 146/96   Pulse 70   Temp (!) 96.4 F (35.8 C) (Tympanic)   SpO2 100%  General:   Alert,  pleasant and cooperative in NAD Head:  Normocephalic and atraumatic. Neck:  Supple; no masses or thyromegaly. Lungs:  Clear throughout to auscultation, normal respiratory effort.    Heart:  +S1, +S2, Regular rate and rhythm, No edema. Abdomen:  Soft, nontender and nondistended. Normal bowel sounds, without guarding, and without rebound.   Neurologic:  Alert and  oriented x4;  grossly normal neurologically.  Impression/Plan: Robert Greer is here for an endoscopy and colonoscopy  to be performed for  evaluation of melena and colon cancer screening.    Risks, benefits, limitations, and alternatives regarding endoscopy have been reviewed with the patient.  Questions have been answered.  All parties agreeable.   Robert Bellows, MD  02/14/2017, 7:54 AM

## 2017-02-14 NOTE — Anesthesia Post-op Follow-up Note (Signed)
Anesthesia QCDR form completed.        

## 2017-02-14 NOTE — Anesthesia Preprocedure Evaluation (Signed)
Anesthesia Evaluation  Patient identified by MRN, date of birth, ID band Patient awake    Reviewed: Allergy & Precautions, H&P , NPO status , Patient's Chart, lab work & pertinent test results  Airway Mallampati: III  TM Distance: <3 FB Neck ROM: limited    Dental  (+) Chipped, Poor Dentition, Missing   Pulmonary neg shortness of breath, sleep apnea and Continuous Positive Airway Pressure Ventilation ,           Cardiovascular Exercise Tolerance: Good hypertension, (-) angina+ CAD, + Past MI, + Cardiac Stents and + Peripheral Vascular Disease  (-) DOE + dysrhythmias      Neuro/Psych PSYCHIATRIC DISORDERS Anxiety Depression negative neurological ROS     GI/Hepatic Neg liver ROS, GERD  Medicated and Controlled,  Endo/Other  negative endocrine ROS  Renal/GU negative Renal ROS  negative genitourinary   Musculoskeletal  (+) Arthritis ,   Abdominal   Peds  Hematology negative hematology ROS (+)   Anesthesia Other Findings Past Medical History: No date: Anxiety No date: Arthritis No date: Backache, unspecified No date: Depressive disorder, not elsewhere classified No date: Gout No date: Hyperlipidemia No date: Hypertension No date: Measles No date: Mobitz (type) II atrioventricular block No date: Mumps 12/05/2016: Other hemochromatosis No date: Sleep apnea  Past Surgical History: No date: ANTERIOR FUSION LUMBAR SPINE 2012: BACK SURGERY     Comment:  x2 01/10/2015: CARDIAC CATHETERIZATION; N/A     Comment:  Procedure: Left Heart Cath and Coronary Angiography;                Surgeon: Wellington Hampshire, MD;  Location: [place]               CV LAB;  Service: Cardiovascular;  Laterality: N/A; 01/10/2015: CARDIAC CATHETERIZATION; N/A     Comment:  Procedure: Coronary Stent Intervention;  Surgeon:               Wellington Hampshire, MD;  Location: [place] CV LAB;                Service: Cardiovascular;   Laterality: N/A; 2005: COLONOSCOPY No date: KNEE SURGERY     Comment:  bilateral No date: TONSILLECTOMY AND ADENOIDECTOMY No date: TOTAL SHOULDER REPLACEMENT; Right     Reproductive/Obstetrics negative OB ROS                             Anesthesia Physical Anesthesia Plan  ASA: III  Anesthesia Plan: General   Post-op Pain Management:    Induction: Intravenous  PONV Risk Score and Plan: Propofol infusion  Airway Management Planned: Natural Airway and Nasal Cannula  Additional Equipment:   Intra-op Plan:   Post-operative Plan:   Informed Consent: I have reviewed the patients History and Physical, chart, labs and discussed the procedure including the risks, benefits and alternatives for the proposed anesthesia with the patient or authorized representative who has indicated his/her understanding and acceptance.   Dental Advisory Given  Plan Discussed with: Anesthesiologist, CRNA and Surgeon  Anesthesia Plan Comments: (Patient consented for risks of anesthesia including but not limited to:  - adverse reactions to medications - risk of intubation if required - damage to teeth, lips or other oral mucosa - sore throat or hoarseness - Damage to heart, brain, lungs or loss of life  Patient voiced understanding.)        Anesthesia Quick Evaluation

## 2017-02-14 NOTE — Anesthesia Postprocedure Evaluation (Signed)
Anesthesia Post Note  Patient: Robert Greer  Procedure(s) Performed: ESOPHAGOGASTRODUODENOSCOPY (EGD) WITH PROPOFOL (N/A ) COLONOSCOPY WITH PROPOFOL (N/A )  Patient location during evaluation: Endoscopy Anesthesia Type: General Level of consciousness: awake and alert Pain management: pain level controlled Vital Signs Assessment: post-procedure vital signs reviewed and stable Respiratory status: spontaneous breathing, nonlabored ventilation, respiratory function stable and patient connected to nasal cannula oxygen Cardiovascular status: blood pressure returned to baseline and stable Postop Assessment: no apparent nausea or vomiting Anesthetic complications: no     Last Vitals:  Vitals:   02/14/17 1019 02/14/17 1030  BP: 100/67 104/87  Pulse: 69 69  Resp: 16 16  Temp:    SpO2: 100% 100%    Last Pain:  Vitals:   02/14/17 1030  TempSrc:   PainSc: 0-No pain                 Precious Haws Piscitello

## 2017-02-14 NOTE — Anesthesia Procedure Notes (Signed)
Date/Time: 02/14/2017 9:16 AM Performed by: Doreen Salvage, CRNA Pre-anesthesia Checklist: Patient identified, Emergency Drugs available, Suction available and Patient being monitored Patient Re-evaluated:Patient Re-evaluated prior to induction Oxygen Delivery Method: Nasal cannula Induction Type: IV induction Dental Injury: Teeth and Oropharynx as per pre-operative assessment  Comments: Nasal cannula with etCO2 monitoring

## 2017-02-14 NOTE — Transfer of Care (Signed)
Immediate Anesthesia Transfer of Care Note  Patient: Robert Greer  Procedure(s) Performed: Procedure(s): ESOPHAGOGASTRODUODENOSCOPY (EGD) WITH PROPOFOL (N/A) COLONOSCOPY WITH PROPOFOL (N/A)  Patient Location: PACU and Endoscopy Unit  Anesthesia Type:General  Level of Consciousness: sedated  Airway & Oxygen Therapy: Patient Spontanous Breathing and Patient connected to nasal cannula oxygen  Post-op Assessment: Report given to RN and Post -op Vital signs reviewed and stable  Post vital signs: Reviewed and stable  Last Vitals:  Vitals:   02/14/17 0726 02/14/17 0949  BP: (!) 146/96 103/72  Pulse: 70 87  Resp:  17  Temp: (!) 35.8 C (!) 36.2 C  SpO2: 188% 67%    Complications: No apparent anesthesia complications

## 2017-02-14 NOTE — Op Note (Signed)
[facility] Gastroenterology Patient Name: Robert Greer Procedure Date: 02/14/2017 9:12 AM MRN: [id number] Account #: [id number] Date of Birth: 1955/01/26 Admit Type: Outpatient Age: 62 Room: [facility] ENDO ROOM 3 Gender: Male Note Status: Finalized Procedure:            Upper GI endoscopy Indications:          Melena Providers:            [name] MD, MD Medicines:            Monitored Anesthesia Care Complications:        No immediate complications. Procedure:            Pre-Anesthesia Assessment:                       - Prior to the procedure, a History and Physical was                        performed, and patient medications, allergies and                        sensitivities were reviewed. The patient's tolerance of                        previous anesthesia was reviewed.                       - The risks and benefits of the procedure and the                        sedation options and risks were discussed with the                        patient. All questions were answered and informed                        consent was obtained.                       - ASA Grade Assessment: III - A patient with severe                        systemic disease.                       After obtaining informed consent, the endoscope was                        passed under direct vision. Throughout the procedure,                        the patient's blood pressure, pulse, and oxygen                        saturations were monitored continuously. The Endoscope                        was introduced through the mouth, and advanced to the                        third part of duodenum. The upper GI endoscopy was  accomplished with ease. The patient tolerated the                        procedure well. Findings:      The esophagus was normal.      The examined duodenum was normal.      Scattered moderate inflammation characterized by congestion (edema) and   erythema was found in the gastric body, in the gastric antrum and in the       prepyloric region of the stomach. Biopsies were taken with a cold       forceps for histology.      The cardia and gastric fundus were normal on retroflexion.      The exam was otherwise without abnormality. Impression:           - Normal esophagus.                       - Normal examined duodenum.                       - Gastritis. Biopsied.                       - The examination was otherwise normal. Recommendation:       - Await pathology results.                       - Perform a colonoscopy today. Procedure Code(s):    --- Professional ---                       [phone], Esophagogastroduodenoscopy, flexible, transoral;                        with biopsy, single or multiple Diagnosis Code(s):    --- Professional ---                       K29.70, Gastritis, unspecified, without bleeding                       K92.1, Melena (includes Hematochezia) CPT copyright 2016 American Medical Association. All rights reserved. The codes documented in this report are preliminary and upon coder review may  be revised to meet current compliance requirements. [name], MD [name] MD, MD 02/14/2017 9:25:54 AM This report has been signed electronically. Number of Addenda: 0 Note Initiated On: 02/14/2017 9:12 AM      [facility]

## 2017-02-14 NOTE — Op Note (Signed)
[facility] Gastroenterology Patient Name: Robert Greer Procedure Date: 02/14/2017 9:11 AM MRN: [id number] Account #: [id number] Date of Birth: 1955/08/07 Admit Type: Outpatient Age: 62 Room: [facility] ENDO ROOM 3 Gender: Male Note Status: Finalized Procedure:            Colonoscopy Indications:          Screening for colorectal malignant neoplasm Providers:            [name] MD, MD Referring MD:         No Local Md, MD (Referring MD) Medicines:            Monitored Anesthesia Care Complications:        No immediate complications. Procedure:            Pre-Anesthesia Assessment:                       - Prior to the procedure, a History and Physical was                        performed, and patient medications, allergies and                        sensitivities were reviewed. The patient's tolerance of                        previous anesthesia was reviewed.                       - The risks and benefits of the procedure and the                        sedation options and risks were discussed with the                        patient. All questions were answered and informed                        consent was obtained.                       - ASA Grade Assessment: III - A patient with severe                        systemic disease.                       After obtaining informed consent, the colonoscope was                        passed under direct vision. Throughout the procedure,                        the patient's blood pressure, pulse, and oxygen                        saturations were monitored continuously. The                        Colonoscope was introduced through the anus and  advanced to the the cecum, identified by the                        appendiceal orifice, IC valve and transillumination.                        The colonoscopy was performed with ease. The patient                        tolerated the procedure well. The quality of  the bowel                        preparation was good. Findings:      The perianal and digital rectal examinations were normal.      The entire examined colon appeared normal on direct and retroflexion       views. Impression:           - The entire examined colon is normal on direct and                        retroflexion views.                       - No specimens collected. Recommendation:       - Discharge patient to home (with escort).                       - Resume previous diet.                       - Continue present medications.                       - Repeat colonoscopy in 10 years for screening purposes. Procedure Code(s):    --- Professional ---                       [phone], Colonoscopy, flexible; diagnostic, including                        collection of specimen(s) by brushing or washing, when                        performed (separate procedure) Diagnosis Code(s):    --- Professional ---                       Z12.11, Encounter for screening for malignant neoplasm                        of colon CPT copyright 2016 American Medical Association. All rights reserved. The codes documented in this report are preliminary and upon coder review may  be revised to meet current compliance requirements. [name], MD [name] MD, MD 02/14/2017 9:44:36 AM This report has been signed electronically. Number of Addenda: 0 Note Initiated On: 02/14/2017 9:11 AM Scope Withdrawal Time: 0 hours 11 minutes 1 second  Total Procedure Duration: 0 hours 15 minutes 33 seconds       [facility]

## 2017-02-15 ENCOUNTER — Inpatient Hospital Stay: Payer: BLUE CROSS/BLUE SHIELD

## 2017-02-15 ENCOUNTER — Encounter: Payer: Self-pay | Admitting: Gastroenterology

## 2017-02-15 LAB — SURGICAL PATHOLOGY

## 2017-02-17 ENCOUNTER — Encounter: Payer: Self-pay | Admitting: Gastroenterology

## 2017-02-22 ENCOUNTER — Inpatient Hospital Stay: Payer: Medicare Other | Attending: Oncology

## 2017-03-01 ENCOUNTER — Inpatient Hospital Stay: Payer: Medicare Other | Attending: Oncology

## 2017-03-12 DIAGNOSIS — Z981 Arthrodesis status: Secondary | ICD-10-CM | POA: Diagnosis not present

## 2017-03-12 DIAGNOSIS — M5136 Other intervertebral disc degeneration, lumbar region: Secondary | ICD-10-CM | POA: Diagnosis not present

## 2017-03-12 DIAGNOSIS — M48062 Spinal stenosis, lumbar region with neurogenic claudication: Secondary | ICD-10-CM | POA: Diagnosis not present

## 2017-03-12 DIAGNOSIS — M47816 Spondylosis without myelopathy or radiculopathy, lumbar region: Secondary | ICD-10-CM | POA: Diagnosis not present

## 2017-03-12 DIAGNOSIS — Z6832 Body mass index (BMI) 32.0-32.9, adult: Secondary | ICD-10-CM | POA: Diagnosis not present

## 2017-03-13 ENCOUNTER — Inpatient Hospital Stay: Payer: Medicare Other | Attending: Oncology

## 2017-03-14 ENCOUNTER — Other Ambulatory Visit: Payer: Self-pay

## 2017-03-14 DIAGNOSIS — C44321 Squamous cell carcinoma of skin of nose: Secondary | ICD-10-CM

## 2017-03-15 ENCOUNTER — Inpatient Hospital Stay: Payer: Medicare Other | Admitting: Oncology

## 2017-03-18 ENCOUNTER — Ambulatory Visit (INDEPENDENT_AMBULATORY_CARE_PROVIDER_SITE_OTHER): Payer: BLUE CROSS/BLUE SHIELD | Admitting: Gastroenterology

## 2017-03-18 ENCOUNTER — Encounter: Payer: Self-pay | Admitting: Gastroenterology

## 2017-03-18 VITALS — BP 135/83 | HR 79 | Temp 98.3°F | Ht 71.0 in | Wt 239.8 lb

## 2017-03-18 DIAGNOSIS — K2970 Gastritis, unspecified, without bleeding: Secondary | ICD-10-CM | POA: Diagnosis not present

## 2017-03-18 DIAGNOSIS — K921 Melena: Secondary | ICD-10-CM

## 2017-03-18 MED ORDER — OMEPRAZOLE 40 MG PO CPDR: 40.0000 mg | DELAYED_RELEASE_CAPSULE | Freq: Every day | ORAL | 3 refills | Status: DC

## 2017-03-18 NOTE — Progress Notes (Signed)
Robert Bellows MD, MRCP(U.K) [address]  [place]  [place], [place] [address]  Main: [phone]  Fax: [phone]   Primary Care Physician: Arlis Porta., MD  Primary Gastroenterologist:  Dr. Jonathon Greer   No chief complaint on file.   HPI: Robert Greer is a 62 y.o. male     Summary of history :  He is here to follow up for  for an elevated iron level. positive for compound heterozygous with C2 82Y/H63D. He undergoes phlebotomy withDr Tasia Catchings    Interval history   01/30/2016-  03/18/2016    02/14/17- EGD bx show moderate chronic gastritis. Colonoscopy -normal   Last visit hepatitis A/B/Fibrosure ordered but not yet done. Had one black stool but no other . He takes meloxicam on a regular basis- for his joints.        Current Outpatient Medications  Medication Sig Dispense Refill  . allopurinol (ZYLOPRIM) 100 MG tablet Take 100 mg by mouth daily.    Marland Kitchen ALPRAZolam (XANAX) 0.5 MG tablet Take 0.5 mg by mouth daily.  0  . aspirin 81 MG chewable tablet Chew 1 tablet (81 mg total) by mouth daily. 30 tablet 3  . atorvastatin (LIPITOR) 20 MG tablet Take 1 tablet (20 mg total) by mouth daily. 90 tablet 3  . busPIRone (BUSPAR) 10 MG tablet   1  . cetirizine (ZYRTEC) 10 MG tablet Take 10 mg by mouth as needed for allergies. Reported on 02/09/2015    . clopidogrel (PLAVIX) 75 MG tablet Take 1 tablet (75 mg total) by mouth daily. 90 tablet 3  . colchicine 0.6 MG tablet Take 0.6 mg by mouth.    Mariane Baumgarten Sodium 100 MG capsule Take 100 mg by mouth.    . DULoxetine (CYMBALTA) 60 MG capsule   1  . ezetimibe (ZETIA) 10 MG tablet Take 1 tablet (10 mg total) by mouth daily. 30 tablet 5  . losartan (COZAAR) 25 MG tablet Take 1 tablet (25 mg total) by mouth daily. 90 tablet 3  . meloxicam (MOBIC) 7.5 MG tablet Take 15 mg by mouth daily.     . nitroGLYCERIN (NITROSTAT) 0.4 MG SL tablet Place 1 tablet (0.4 mg total) under the tongue every 5 (five) minutes as needed for chest pain.  (Patient not taking: Reported on 02/14/2017) 25 tablet 3  . oxyCODONE-acetaminophen (PERCOCET) 10-325 MG tablet Take 1 tablet by mouth every 4 (four) hours as needed for pain.     No current facility-administered medications for this visit.     Allergies as of 03/18/2017  . (No Known Allergies)    ROS:  General: Negative for anorexia, weight loss, fever, chills, fatigue, weakness. ENT: Negative for hoarseness, difficulty swallowing , nasal congestion. CV: Negative for chest pain, angina, palpitations, dyspnea on exertion, peripheral edema.  Respiratory: Negative for dyspnea at rest, dyspnea on exertion, cough, sputum, wheezing.  GI: See history of present illness. GU:  Negative for dysuria, hematuria, urinary incontinence, urinary frequency, nocturnal urination.  Endo: Negative for unusual weight change.    Physical Examination:   There were no vitals taken for this visit.  General: Well-nourished, well-developed in no acute distress.  Eyes: No icterus. Conjunctivae pink. Mouth: Oropharyngeal mucosa moist and pink , no lesions erythema or exudate. Lungs: Clear to auscultation bilaterally. Non-labored. Heart: Regular rate and rhythm, no murmurs rubs or gallops.  Abdomen: Bowel sounds are normal, nontender, nondistended, no hepatosplenomegaly or masses, no abdominal bruits or hernia , no rebound or guarding.  Extremities: No lower extremity edema. No clubbing or deformities. Neuro: Alert and oriented x 3.  Grossly intact. Skin: Warm and dry, no jaundice.   Psych: Alert and cooperative, normal mood and affect.   Imaging Studies: No results found.  Assessment and Plan:   Robert Greer is a 62 y.o. y/o male here to follow up for  Hemochromatosis .He is a C282Y/H63Dcompound heterozygote genotype and they usually have a milder form of disease . Marland Kitchen No biochemical evidence of liver cirrhosis. Likely gastritis from NSAID use.   Plan 1. Suggest  limit the intake of ethanol, avoid  taking iron or vitamin C supplements, and avoid ingestion of uncooked seafood due to risk of vibrio vulnificus infection  2. Screen for immunity for hepatitis A/B, r/o hepatitis C.Will need annual flu shot, pneumonia , Tdap vaccine with Robert Pulling Ronelle Nigh., MD . IF not immune to hepA/B will need to be immunized.  3. Try stop use of meloxicam , start on PPI  Dr Robert Bellows  MD,MRCP [facility]) Follow up in 6 months

## 2017-03-20 DIAGNOSIS — M48062 Spinal stenosis, lumbar region with neurogenic claudication: Secondary | ICD-10-CM | POA: Diagnosis not present

## 2017-03-20 DIAGNOSIS — M47816 Spondylosis without myelopathy or radiculopathy, lumbar region: Secondary | ICD-10-CM | POA: Diagnosis not present

## 2017-04-30 ENCOUNTER — Telehealth: Payer: Self-pay | Admitting: *Deleted

## 2017-04-30 NOTE — Telephone Encounter (Signed)
Please arrange him to have  CBC, iron TIBC ferritin 1-2 days prior to MD/possible phlebotomy

## 2017-04-30 NOTE — Telephone Encounter (Signed)
Patient called wanting to reschedule his cancelled appointment from Feb

## 2017-05-01 ENCOUNTER — Other Ambulatory Visit: Payer: Self-pay | Admitting: *Deleted

## 2017-05-07 ENCOUNTER — Other Ambulatory Visit: Payer: Self-pay

## 2017-05-07 ENCOUNTER — Inpatient Hospital Stay: Payer: BLUE CROSS/BLUE SHIELD | Attending: Oncology

## 2017-05-07 DIAGNOSIS — C44321 Squamous cell carcinoma of skin of nose: Secondary | ICD-10-CM

## 2017-05-07 LAB — CBC WITH DIFFERENTIAL/PLATELET
BASOS ABS: 0 10*3/uL (ref 0–0.1)
BASOS PCT: 1 %
EOS PCT: 3 %
Eosinophils Absolute: 0.2 10*3/uL (ref 0–0.7)
HEMATOCRIT: 42.9 % (ref 40.0–52.0)
Hemoglobin: 14.8 g/dL (ref 13.0–18.0)
Lymphocytes Relative: 23 %
Lymphs Abs: 1.8 10*3/uL (ref 1.0–3.6)
MCH: 31.6 pg (ref 26.0–34.0)
MCHC: 34.6 g/dL (ref 32.0–36.0)
MCV: 91.3 fL (ref 80.0–100.0)
MONO ABS: 0.5 10*3/uL (ref 0.2–1.0)
Monocytes Relative: 6 %
NEUTROS ABS: 5.3 10*3/uL (ref 1.4–6.5)
Neutrophils Relative %: 67 %
Platelets: 224 10*3/uL (ref 150–440)
RBC: 4.69 MIL/uL (ref 4.40–5.90)
RDW: 12.4 % (ref 11.5–14.5)
WBC: 7.8 10*3/uL (ref 3.8–10.6)

## 2017-05-07 LAB — IRON AND TIBC
IRON: 111 ug/dL (ref 45–182)
Saturation Ratios: 37 % (ref 17.9–39.5)
TIBC: 298 ug/dL (ref 250–450)
UIBC: 187 ug/dL

## 2017-05-07 LAB — FERRITIN: FERRITIN: 118 ng/mL (ref 24–336)

## 2017-05-09 ENCOUNTER — Inpatient Hospital Stay (HOSPITAL_BASED_OUTPATIENT_CLINIC_OR_DEPARTMENT_OTHER): Payer: BLUE CROSS/BLUE SHIELD | Admitting: Oncology

## 2017-05-09 ENCOUNTER — Other Ambulatory Visit: Payer: Self-pay

## 2017-05-09 ENCOUNTER — Inpatient Hospital Stay: Payer: BLUE CROSS/BLUE SHIELD

## 2017-05-09 ENCOUNTER — Encounter: Payer: Self-pay | Admitting: Oncology

## 2017-05-09 NOTE — Progress Notes (Signed)
Hematology/Oncology Follow up note [facility] [phone] [phone]   Patient Care Team: Arlis Porta., MD as PCP - General (Unknown Physician Specialty)  REFERRING PROVIDER: Dr.Rabinowitz REASON FOR VISIT Follow up for treatment of hemochromotosis  HISTORY OF PRESENTING ILLNESS:  Robert Greer is a  62 y.o.  male with PMH listed below who was referred to me for evaluation of  hemochromatosis. Patient is a Airline pilot. Patient was seen at [facility] occupational health clinic. He had some lab work done. His iron has been persistently elevated. Most recent serum iron level is 196. Ferritin is elevated at 510. Hemochromatosis mutation PCR was checked. Patient is positive for compound heterozygous with C2 82Y/H63D. Patient also has mild elevation of transaminitis ALT is elevated at 47, normal AST 32. LDH 207. Creatinine 1.07. TSH is elevated at 5.6 hemoglobin 15, platelet 273, WBC 7.8. Elevated eosinophil count on differential at 0.5. Rest differential was normal.  #Patient also has been seen and evaluated by Dr. Vicente Males, recommending EGD and colonoscopy for evaluation of black tarry stool  And colorectal cancer screening. 02/19/2017 normal examination of colonoscopy. Upper Endoscopy showed gastritis.   INTERVAL HISTORY Robert Greer is a 62 y.o. male who has above history reviewed by me today presents for follow up visit for management of hemochromatosis.  4/16 lab showed stable hemoglobin, ferritin 118.  Reports multiple joint pain otherwise no complaints.   Review of Systems  Constitutional: Negative for chills, diaphoresis, fever and weight loss.  HENT: Negative for hearing loss and nosebleeds.   Eyes: Negative for blurred vision, photophobia, pain and discharge.  Respiratory: Negative for cough, hemoptysis and sputum production.   Cardiovascular: Negative for chest pain, palpitations and orthopnea.  Gastrointestinal: Negative for  abdominal pain, diarrhea, heartburn, melena, nausea and vomiting.  Genitourinary: Negative for dysuria, frequency and urgency.  Musculoskeletal: Negative for myalgias and neck pain.  Skin: Negative for rash.  Neurological: Negative for dizziness, tingling and sensory change.  Endo/Heme/Allergies: Negative for environmental allergies. Does not bruise/bleed easily.  Psychiatric/Behavioral: Negative for depression, substance abuse and suicidal ideas. The patient is not nervous/anxious.    MEDICAL HISTORY:  Past Medical History:  Diagnosis Date  . Anxiety   . Arthritis   . Backache, unspecified   . Depressive disorder, not elsewhere classified   . Gout   . Hyperlipidemia   . Hypertension   . Measles   . Mobitz (type) II atrioventricular block   . Mumps   . Other hemochromatosis 12/05/2016  . Sleep apnea     SURGICAL HISTORY: Past Surgical History:  Procedure Laterality Date  . ANTERIOR FUSION LUMBAR SPINE    . BACK SURGERY  2012   x2  . CARDIAC CATHETERIZATION N/A 01/10/2015   Procedure: Left Heart Cath and Coronary Angiography;  Surgeon: Wellington Hampshire, MD;  Location: [facility];  Service: Cardiovascular;  Laterality: N/A;  . CARDIAC CATHETERIZATION N/A 01/10/2015   Procedure: Coronary Stent Intervention;  Surgeon: Wellington Hampshire, MD;  Location: [facility];  Service: Cardiovascular;  Laterality: N/A;  . COLONOSCOPY  2005  . COLONOSCOPY WITH PROPOFOL N/A 02/14/2017   Procedure: COLONOSCOPY WITH PROPOFOL;  Surgeon: [name], MD;  Location: [facility] ENDOSCOPY;  Service: Gastroenterology;  Laterality: N/A;  . ESOPHAGOGASTRODUODENOSCOPY (EGD) WITH PROPOFOL N/A 02/14/2017   Procedure: ESOPHAGOGASTRODUODENOSCOPY (EGD) WITH PROPOFOL;  Surgeon: [name], MD;  Location: [facility] ENDOSCOPY;  Service: Gastroenterology;  Laterality: N/A;  . KNEE SURGERY  bilateral  . TONSILLECTOMY AND ADENOIDECTOMY    . TOTAL SHOULDER REPLACEMENT Right     SOCIAL HISTORY: Social  History   Socioeconomic History  . Marital status: Married    Spouse name: Not on file  . Number of children: Not on file  . Years of education: Not on file  . Highest education level: Not on file  Occupational History  . Not on file  Social Needs  . Financial resource strain: Not on file  . Food insecurity:    Worry: Not on file    Inability: Not on file  . Transportation needs:    Medical: Not on file    Non-medical: Not on file  Tobacco Use  . Smoking status: Never Smoker  . Smokeless tobacco: Current User    Types: Snuff  Substance and Sexual Activity  . Alcohol use: Yes    Alcohol/week: 1.2 - 1.8 oz    Types: 2 - 3 Cans of beer per week    Comment: ocassionally  . Drug use: No  . Sexual activity: Yes  Lifestyle  . Physical activity:    Days per week: Not on file    Minutes per session: Not on file  . Stress: Not on file  Relationships  . Social connections:    Talks on phone: Not on file    Gets together: Not on file    Attends religious service: Not on file    Active member of club or organization: Not on file    Attends meetings of clubs or organizations: Not on file    Relationship status: Not on file  . Intimate partner violence:    Fear of current or ex partner: Not on file    Emotionally abused: Not on file    Physically abused: Not on file    Forced sexual activity: Not on file  Other Topics Concern  . Not on file  Social History Narrative  . Not on file    FAMILY HISTORY: Family History  Problem Relation Age of Onset  . Stroke Mother   . Liver disease Father   . Lung cancer Sister   . Diabetes Sister   . Liver disease Brother   . Lung cancer Paternal Grandmother     ALLERGIES:  has No Known Allergies.  MEDICATIONS:  Current Outpatient Medications  Medication Sig Dispense Refill  . allopurinol (ZYLOPRIM) 100 MG tablet Take 100 mg by mouth daily.    Marland Kitchen ALPRAZolam (XANAX) 0.5 MG tablet Take 0.5 mg by mouth daily.  0  . amoxicillin  (AMOXIL) 500 MG capsule   0  . aspirin 81 MG chewable tablet Chew 1 tablet (81 mg total) by mouth daily. 30 tablet 3  . atorvastatin (LIPITOR) 20 MG tablet Take 1 tablet (20 mg total) by mouth daily. 90 tablet 3  . busPIRone (BUSPAR) 10 MG tablet   1  . cetirizine (ZYRTEC) 10 MG tablet Take 10 mg by mouth as needed for allergies. Reported on 02/09/2015    . clopidogrel (PLAVIX) 75 MG tablet Take 1 tablet (75 mg total) by mouth daily. 90 tablet 3  . colchicine 0.6 MG tablet Take 0.6 mg by mouth.    . DULoxetine (CYMBALTA) 60 MG capsule   1  . ezetimibe (ZETIA) 10 MG tablet Take 1 tablet (10 mg total) by mouth daily. 30 tablet 5  . losartan (COZAAR) 25 MG tablet Take 1 tablet (25 mg total) by mouth daily. 90 tablet 3  . meloxicam (MOBIC)  15 MG tablet   0  . meloxicam (MOBIC) 7.5 MG tablet Take 15 mg by mouth daily.     . nitroGLYCERIN (NITROSTAT) 0.4 MG SL tablet Place 1 tablet (0.4 mg total) under the tongue every 5 (five) minutes as needed for chest pain. 25 tablet 3  . omeprazole (PRILOSEC) 40 MG capsule Take 1 capsule (40 mg total) by mouth daily. 90 capsule 3  . oxyCODONE-acetaminophen (PERCOCET) 10-325 MG tablet Take 1 tablet by mouth every 4 (four) hours as needed for pain.    Marland Kitchen tiZANidine (ZANAFLEX) 4 MG tablet   0   No current facility-administered medications for this visit.       Marland Kitchen  PHYSICAL EXAMINATION: ECOG PERFORMANCE STATUS: 0 - Asymptomatic Vitals:   05/09/17 1510  BP: (!) 142/80  Pulse: 77  Temp: 99 F (37.2 C)   Filed Weights   05/09/17 1510  Weight: 236 lb 9 oz (107.3 kg)   GENERAL: No distress, well nourished.  SKIN:  No rashes or significant lesions  HEAD: Normocephalic, No masses, lesions, tenderness or abnormalities  EYES: Conjunctiva are pink, non icteric ENT: External ears normal ,lips , buccal mucosa, and tongue normal and mucous membranes are moist  LYMPH: No palpable cervical and axillary lymphadenopathy  LUNGS: Clear to auscultation, no crackles or  wheezes HEART: Regular rate & rhythm, no murmurs, no gallops, S1 normal and S2 normal  ABDOMEN: Abdomen soft, non-tender, normal bowel sounds, I did not appreciate any  masses or organomegaly  MUSCULOSKELETAL: No CVA tenderness and no tenderness on percussion of the back or rib cage.  EXTREMITIES: No edema, no skin discoloration or tenderness NEURO: Alert & oriented, no focal motor/sensory deficits.    LABORATORY DATA:  I have reviewed the data as listed Lab Results  Component Value Date   WBC 7.8 05/07/2017   HGB 14.8 05/07/2017   HCT 42.9 05/07/2017   MCV 91.3 05/07/2017   PLT 224 05/07/2017   Recent Labs    11/08/16 1005 12/04/16 1115  NA 141 137  K 4.3 4.7  CL 106 102  CO2 25 25  GLUCOSE 105* 108*  BUN 20 24*  CREATININE 1.10 1.16  CALCIUM 9.8 9.3  GFRNONAA >60 >60  GFRAA >60 >60  PROT  --  8.1  ALBUMIN  --  4.5  AST  --  27  ALT  --  38  ALKPHOS  --  62  BILITOT  --  0.7   ASSESSMENT & PLAN:  1. Hereditary hemochromatosis ([place])   2. Iron overload    # Iron panel responded well to phlebotomy.  most recent ferritin was 118.   Goal ferritin for him prefer to be at least less than 100, preferably less than 50. Discuss with him for phlebotomy 500 ml blood removal utilizing blood bank program and patient is interested.   We will enroll patient to our phlebotomy program. # discussed with patient to avoid alcohol, avoid uncooked seafood. Otherwise first-degree relatives is to be screened.   Check  CBC, iron TIBC ferratin in 3 months.  All questions were answered. The patient knows to call the clinic with any problems questions or concerns.  Return of visit: Follow-up in 3 months with labs done ahead of visit.   Earlie Server, MD, PhD Hematology Oncology [facility] Pager- [phone] 05/09/2017

## 2017-05-09 NOTE — Progress Notes (Signed)
Patient here today for follow up.  Patient states no new concerns today  

## 2017-05-15 DIAGNOSIS — M47816 Spondylosis without myelopathy or radiculopathy, lumbar region: Secondary | ICD-10-CM | POA: Diagnosis not present

## 2017-05-15 DIAGNOSIS — Z6832 Body mass index (BMI) 32.0-32.9, adult: Secondary | ICD-10-CM | POA: Diagnosis not present

## 2017-05-15 DIAGNOSIS — M48062 Spinal stenosis, lumbar region with neurogenic claudication: Secondary | ICD-10-CM | POA: Diagnosis not present

## 2017-05-20 ENCOUNTER — Inpatient Hospital Stay: Payer: BLUE CROSS/BLUE SHIELD

## 2017-05-29 ENCOUNTER — Other Ambulatory Visit: Payer: Self-pay

## 2017-05-30 ENCOUNTER — Telehealth: Payer: Self-pay

## 2017-05-30 NOTE — Telephone Encounter (Signed)
LVM for patient callback concerning lab results from [place].  Per Dr. Vicente Males, thyroid level should be monitored by PCP. Possible medication needed.

## 2017-06-07 ENCOUNTER — Other Ambulatory Visit: Payer: Self-pay | Admitting: Cardiovascular Disease

## 2017-06-18 ENCOUNTER — Other Ambulatory Visit: Payer: Self-pay

## 2017-06-18 ENCOUNTER — Encounter: Payer: Self-pay | Admitting: Nurse Practitioner

## 2017-06-18 ENCOUNTER — Ambulatory Visit (INDEPENDENT_AMBULATORY_CARE_PROVIDER_SITE_OTHER): Payer: BLUE CROSS/BLUE SHIELD | Admitting: Nurse Practitioner

## 2017-06-18 VITALS — BP 122/69 | HR 80 | Temp 97.8°F | Resp 16 | Ht 73.0 in | Wt 251.6 lb

## 2017-06-18 DIAGNOSIS — M109 Gout, unspecified: Secondary | ICD-10-CM | POA: Insufficient documentation

## 2017-06-18 DIAGNOSIS — M10071 Idiopathic gout, right ankle and foot: Secondary | ICD-10-CM

## 2017-06-18 MED ORDER — ALLOPURINOL 100 MG PO TABS: 100.0000 mg | ORAL_TABLET | Freq: Every day | ORAL | 1 refills | Status: DC

## 2017-06-18 MED ORDER — COLCHICINE 0.6 MG PO TABS: ORAL_TABLET | ORAL | 1 refills | Status: DC

## 2017-06-18 NOTE — Patient Instructions (Addendum)
Robert Greer,   Thank you for coming in to clinic today.  1. Resume colchicine 2. Continue allopurinol 3. You will be due for NON-FASTING BLOOD WORK in about 4 weeks - Please go ahead and schedule a "Lab Only" visit in the morning at the clinic for lab draw in 4 weeks. - Your results will be available about 2-3 days after blood draw.  If you have set up a MyChart account, you can can log in to MyChart online to view your results and a brief explanation. Also, we can discuss your results together at your next office visit if you would like.   Please schedule a follow-up appointment with Cassell Smiles, AGNP. Return if symptoms worsen or fail to improve.  If you have any other questions or concerns, please feel free to call the clinic or send a message through [place]. You may also schedule an earlier appointment if necessary.  You will receive a survey after today's visit either digitally by e-mail or paper by [employer]. Your experiences and feedback matter to [place].  Please respond so we know how we are doing as we provide care for you.   Cassell Smiles, DNP, AGNP-BC Adult Gerontology Nurse Practitioner [facility], [facility]  Low-Purine Diet Purines are compounds that affect the level of uric acid in your body. A low-purine diet is a diet that is low in purines. Eating a low-purine diet can prevent the level of uric acid in your body from getting too high and causing gout or kidney stones or both. What do I need to know about this diet?  Choose low-purine foods. Examples of low-purine foods are listed in the next section.  Drink plenty of fluids, especially water. Fluids can help remove uric acid from your body. Try to drink 8-16 cups (1.9-3.8 L) a [place].  Limit foods high in fat, especially saturated fat, as fat makes it harder for the body to get rid of uric acid. Foods high in saturated fat include pizza, cheese, ice cream, whole milk, fried foods, and gravies. Choose foods that  are lower in fat and lean sources of protein. Use olive oil when cooking as it contains healthy fats that are not high in saturated fat.  Limit alcohol. Alcohol interferes with the elimination of uric acid from your body. If you are having a gout attack, avoid all alcohol.  Keep in mind that different people's bodies react differently to different foods. You will probably learn over time which foods do or do not affect you. If you discover that a food tends to cause your gout to flare up, avoid eating that food. You can more freely enjoy foods that do not cause problems. If you have any questions about a food item, talk to your dietitian or health care provider. Which foods are low, moderate, and high in purines? The following is a list of foods that are low, moderate, and high in purines. You can eat any amount of the foods that are low in purines. You may be able to have small amounts of foods that are moderate in purines. Ask your health care provider how much of a food moderate in purines you can have. Avoid foods high in purines. Grains  Foods low in purines: Enriched white bread, pasta, rice, cake, cornbread, popcorn.  Foods moderate in purines: Whole-grain breads and cereals, wheat germ, bran, oatmeal. Uncooked oatmeal. Dry wheat bran or wheat germ.  Foods high in purines: Pancakes, [place] toast, biscuits, muffins. Vegetables  Foods low in purines: All vegetables, except those that are moderate in purines.  Foods moderate in purines: Asparagus, cauliflower, spinach, mushrooms, green peas. Fruits  All fruits are low in purines. Meats and other Protein Foods  Foods low in purines: Eggs, nuts, peanut butter.  Foods moderate in purines: 80-90% lean beef, lamb, veal, pork, poultry, fish, eggs, peanut butter, nuts. Crab, lobster, oysters, and shrimp. Cooked dried beans, peas, and lentils.  Foods high in purines: Anchovies, sardines, herring, mussels, tuna, codfish, scallops, trout, and  haddock. Berniece Salines. Organ meats (such as liver or kidney). Tripe. Game meat. Goose. Sweetbreads. Dairy  All dairy foods are low in purines. Low-fat and fat-free dairy products are best because they are low in saturated fat. Beverages  Drinks low in purines: Water, carbonated beverages, tea, coffee, cocoa.  Drinks moderate in purines: Soft drinks and other drinks sweetened with high-fructose corn syrup. Juices. To find whether a food or drink is sweetened with high-fructose corn syrup, look at the ingredients list.  Drinks high in purines: Alcoholic beverages (such as beer). Condiments  Foods low in purines: Salt, herbs, olives, pickles, relishes, vinegar.  Foods moderate in purines: Butter, margarine, oils, mayonnaise. Fats and Oils  Foods low in purines: All types, except gravies and sauces made with meat.  Foods high in purines: Gravies and sauces made with meat. Other Foods  Foods low in purines: Sugars, sweets, gelatin. Cake. Soups made without meat.  Foods moderate in purines: Meat-based or fish-based soups, broths, or bouillons. Foods and drinks sweetened with high-fructose corn syrup.  Foods high in purines: High-fat desserts (such as ice cream, cookies, cakes, pies, doughnuts, and chocolate). Contact your dietitian for more information on foods that are not listed here. This information is not intended to replace advice given to you by your health care provider. Make sure you discuss any questions you have with your health care provider. Document Released: 05/05/2010 Document Revised: 06/16/2015 Document Reviewed: 12/15/2012 Elsevier Interactive Patient Education  2017 [employer].

## 2017-06-18 NOTE — Progress Notes (Signed)
Subjective:    Patient ID: Robert Greer, male    DOB: February 15, 1955, 62 y.o.   MRN: [id number]  Robert Greer is a 62 y.o. male presenting on 06/18/2017 for Gout (Right foot flare up started yesterday-out of medication. )   HPI Gout Pt states R foot (great toe) flare began yesterday, but did not become severe until this morning.  Is usually taking allopurinol 100 mg daily.  Also usually has colchicine 0.6 mg tablets for flares, but he is currently out.    He has had prior gout flares of R great toe 1st MTP joint.  It is very painful to bear weight and walk on R foot today. - No recent uric acid level checked.   Is planning to have physical and labs with the city of [place] in June.  Will transition to Medicare physicals for 2020.   Social History   Tobacco Use  . Smoking status: Never Smoker  . Smokeless tobacco: Current User    Types: Snuff  Substance Use Topics  . Alcohol use: Yes    Alcohol/week: 1.2 - 1.8 oz    Types: 2 - 3 Cans of beer per week    Comment: ocassionally  . Drug use: No    Review of Systems Per HPI unless specifically indicated above     Objective:    BP 122/69   Pulse 80   Temp 97.8 F (36.6 C) (Oral)   Resp 16   Ht 6\' 1"  (1.854 m)   Wt 251 lb 9.6 oz (114.1 kg)   SpO2 99%   BMI 33.19 kg/m   Wt Readings from Last 3 Encounters:  06/18/17 251 lb 9.6 oz (114.1 kg)  05/09/17 236 lb 9 oz (107.3 kg)  03/18/17 239 lb 12.8 oz (108.8 kg)    Physical Exam  Constitutional: He is oriented to person, place, and time. He appears well-developed and well-nourished. No distress.  HENT:  Head: Normocephalic and atraumatic.  Musculoskeletal:       Right foot: There is decreased range of motion, tenderness and swelling.       Feet:  Neurological: He is alert and oriented to person, place, and time.  Skin: Skin is warm and dry.  Psychiatric: He has a normal mood and affect. His behavior is normal.  Vitals reviewed.    Results for orders placed or performed  in visit on 05/07/17  Iron and TIBC  Result Value Ref Range   Iron 111 45 - 182 ug/dL   TIBC 298 250 - 450 ug/dL   Saturation Ratios 37 17.9 - 39.5 %   UIBC 187 ug/dL  Ferritin  Result Value Ref Range   Ferritin 118 24 - 336 ng/mL  CBC with Differential/Platelet  Result Value Ref Range   WBC 7.8 3.8 - 10.6 K/uL   RBC 4.69 4.40 - 5.90 MIL/uL   Hemoglobin 14.8 13.0 - 18.0 g/dL   HCT 42.9 40.0 - 52.0 %   MCV 91.3 80.0 - 100.0 fL   MCH 31.6 26.0 - 34.0 pg   MCHC 34.6 32.0 - 36.0 g/dL   RDW 12.4 11.5 - 14.5 %   Platelets 224 150 - 440 K/uL   Neutrophils Relative % 67 %   Neutro Abs 5.3 1.4 - 6.5 K/uL   Lymphocytes Relative 23 %   Lymphs Abs 1.8 1.0 - 3.6 K/uL   Monocytes Relative 6 %   Monocytes Absolute 0.5 0.2 - 1.0 K/uL   Eosinophils Relative 3 %  Eosinophils Absolute 0.2 0 - 0.7 K/uL   Basophils Relative 1 %   Basophils Absolute 0.0 0 - 0.1 K/uL      Assessment & Plan:   Problem List Items Addressed This Visit    None    Visit Diagnoses    Acute idiopathic gout involving toe of right foot    -  Primary   Relevant Medications   colchicine 0.6 MG tablet   allopurinol (ZYLOPRIM) 100 MG tablet   Other Relevant Orders   Uric acid    Clinically consistent with acute gout flare of Right great toe MTP, onset 1 Bega ago) with worsening/improvement, out of colchicine, but has worked in past. Known chronic history of gout, recurrent flare in this joint, last flare several years ago. Differential Dx - unlikely trauma without known inciting injury, unlikely OA, no sign of infection or systemic symptoms. - Currently on allopurinol 100 mg once daily. - Last uric acid level unknown  Plan: 1. Start colchicine 0.6mg  two tablets for one dose.  Then take BID until 2-3 days after resolved 2. Continue allopurinol for prophylaxis 3. Avoid excessive ambulation, relative rest, ice if helps, can take Tylenol PRN 4. Declined note for work - still able to work on reduced duty.   Self-employed. 5. Avoid food triggers (red meat, alcohol)  Low purine diet handout provided. - Encouraged adequate hydration 6. Follow-up within 4 weeks for labs and office visit if remains elevated.    Meds ordered this encounter  Medications  . colchicine 0.6 MG tablet    Sig: Take 2 tabs (1.2 mg) once dose at start of gout flare.  Then take 1 tab (0.6 mg) every 12 hours until end of gout flare or max of 14 days.    Dispense:  30 tablet    Refill:  1    Order Specific Question:   Supervising Provider    Answer:   Olin Hauser [[id number]]  . allopurinol (ZYLOPRIM) 100 MG tablet    Sig: Take 1 tablet (100 mg total) by mouth daily.    Dispense:  90 tablet    Refill:  1    Order Specific Question:   Supervising Provider    Answer:   Olin Hauser [[id number]]    Follow up plan: Return if symptoms worsen or fail to improve.  Cassell Smiles, DNP, AGPCNP-BC Adult Gerontology Primary Care Nurse Practitioner [facility] 06/18/2017, 10:14 AM

## 2017-07-02 ENCOUNTER — Encounter: Payer: Self-pay | Admitting: Nurse Practitioner

## 2017-07-02 ENCOUNTER — Ambulatory Visit (INDEPENDENT_AMBULATORY_CARE_PROVIDER_SITE_OTHER): Payer: BLUE CROSS/BLUE SHIELD | Admitting: Nurse Practitioner

## 2017-07-02 ENCOUNTER — Other Ambulatory Visit: Payer: Self-pay

## 2017-07-02 VITALS — BP 109/55 | HR 75 | Temp 97.9°F | Ht 73.0 in | Wt 232.8 lb

## 2017-07-02 DIAGNOSIS — M8949 Other hypertrophic osteoarthropathy, multiple sites: Secondary | ICD-10-CM

## 2017-07-02 DIAGNOSIS — Z0001 Encounter for general adult medical examination with abnormal findings: Secondary | ICD-10-CM | POA: Diagnosis not present

## 2017-07-02 DIAGNOSIS — S30861A Insect bite (nonvenomous) of abdominal wall, initial encounter: Secondary | ICD-10-CM

## 2017-07-02 DIAGNOSIS — Z23 Encounter for immunization: Secondary | ICD-10-CM

## 2017-07-02 DIAGNOSIS — M150 Primary generalized (osteo)arthritis: Secondary | ICD-10-CM

## 2017-07-02 DIAGNOSIS — W57XXXA Bitten or stung by nonvenomous insect and other nonvenomous arthropods, initial encounter: Secondary | ICD-10-CM | POA: Diagnosis not present

## 2017-07-02 DIAGNOSIS — M159 Polyosteoarthritis, unspecified: Secondary | ICD-10-CM

## 2017-07-02 MED ORDER — MELOXICAM 7.5 MG PO TABS: 7.5000 mg | ORAL_TABLET | Freq: Every day | ORAL | 5 refills | Status: DC | PRN

## 2017-07-02 NOTE — Patient Instructions (Addendum)
Robert Greer,   Thank you for coming in to clinic today.  1. Increase your physical activity until you are increasing your heart rate for 30 minutes on most days of the week.  2. Continue eating healthy diet.  Reduce a little of your convenience foods and snacks.  3. Watch for any red bulls-eye rash.  4. Call to find out about shingles vaccine coverage.  You will need two Shingrix vaccines within 6 months for full coverage.  Please schedule a follow-up appointment with Cassell Smiles, AGNP. Return in about 1 year (around 07/03/2018), or if symptoms worsen or fail to improve, for annual physical.  If you have any other questions or concerns, please feel free to call the clinic or send a message through [name]. You may also schedule an earlier appointment if necessary.  You will receive a survey after today's visit either digitally by e-mail or paper by [employer]. Your experiences and feedback matter to [place].  Please respond so we know how we are doing as we provide care for you.   Cassell Smiles, DNP, AGNP-BC Adult Gerontology Nurse Practitioner [place]

## 2017-07-02 NOTE — Progress Notes (Signed)
Subjective:    Patient ID: Robert Greer, male    DOB: 10-03-1955, 62 y.o.   MRN: [id number]  Robert Greer is a 62 y.o. male presenting on 07/02/2017 for Annual Exam   HPI  Annual Physical Exam Patient has been feeling well.  They have no acute concerns today. Sleeps 6-7 hours per night usually uninterrupted. Wears CPAP nightly.  HEALTH MAINTENANCE: Weight/BMI: stable with April weight Physical activity: regular with work Diet: generally healthy.  Rarely meals out.  Often skips lunch.  Eats bacon eggs or sausage eggs for breakfast daily. Dinner usually meat and vegetables. Seatbelt: always  Sunscreen: rarely Prostate exam/PSA: DRE due,  Colon Cancer Screen: up to date - Also had EGD and was put on omeprazole for esophageal irritation - reduced meloxicam to QOD.  Is willing to try reduced dose for arthritis pain. HIV/HEP C: due Optometry: last exam "a while" - needs exam Dentistry: regular  VACCINES: Tetanus: unknown  Influenza: yearly Shingles: Zostavax  Smokeless tobacco - small can every 2-3 days x 20 years   Past Medical History:  Diagnosis Date  . Anxiety   . Arthritis   . Backache, unspecified   . Depressive disorder, not elsewhere classified   . Gout   . Hyperlipidemia   . Hypertension   . Measles   . Mobitz (type) II atrioventricular block   . Mumps   . Other hemochromatosis 12/05/2016  . Sleep apnea    Past Surgical History:  Procedure Laterality Date  . ANTERIOR FUSION LUMBAR SPINE    . BACK SURGERY  2012   x2  . CARDIAC CATHETERIZATION N/A 01/10/2015   Procedure: Left Heart Cath and Coronary Angiography;  Surgeon: Wellington Hampshire, MD;  Location: [facility];  Service: Cardiovascular;  Laterality: N/A;  . CARDIAC CATHETERIZATION N/A 01/10/2015   Procedure: Coronary Stent Intervention;  Surgeon: Wellington Hampshire, MD;  Location: [facility];  Service: Cardiovascular;  Laterality: N/A;  . COLONOSCOPY  2005  . COLONOSCOPY WITH PROPOFOL  N/A 02/14/2017   Procedure: COLONOSCOPY WITH PROPOFOL;  Surgeon: [name], MD;  Location: [facility]COPY;  Service: Gastroenterology;  Laterality: N/A;  . ESOPHAGOGASTRODUODENOSCOPY (EGD) WITH PROPOFOL N/A 02/14/2017   Procedure: ESOPHAGOGASTRODUODENOSCOPY (EGD) WITH PROPOFOL;  Surgeon: [name], MD;  Location: [facility]COPY;  Service: Gastroenterology;  Laterality: N/A;  . KNEE SURGERY     bilateral  . TONSILLECTOMY AND ADENOIDECTOMY    . TOTAL SHOULDER REPLACEMENT Right    Social History   Socioeconomic History  . Marital status: Married    Spouse name: Not on file  . Number of children: Not on file  . Years of education: Not on file  . Highest education level: Not on file  Occupational History  . Not on file  Social Needs  . Financial resource strain: Not on file  . Food insecurity:    Worry: Not on file    Inability: Not on file  . Transportation needs:    Medical: Not on file    Non-medical: Not on file  Tobacco Use  . Smoking status: Never Smoker  . Smokeless tobacco: Current User    Types: Snuff  Substance and Sexual Activity  . Alcohol use: Yes    Alcohol/week: 1.2 - 1.8 oz    Types: 2 - 3 Cans of beer per week    Comment: ocassionally  . Drug use: No  . Sexual activity: Yes  Lifestyle  . Physical activity:    Days  per week: Not on file    Minutes per session: Not on file  . Stress: Not on file  Relationships  . Social connections:    Talks on phone: Not on file    Gets together: Not on file    Attends religious service: Not on file    Active member of club or organization: Not on file    Attends meetings of clubs or organizations: Not on file    Relationship status: Not on file  . Intimate partner violence:    Fear of current or ex partner: Not on file    Emotionally abused: Not on file    Physically abused: Not on file    Forced sexual activity: Not on file  Other Topics Concern  . Not on file  Social History Narrative  . Not on file    Family History  Problem Relation Age of Onset  . Stroke Mother   . Liver disease Father   . Lung cancer Sister   . Diabetes Sister   . Liver disease Brother   . Lung cancer Paternal Grandmother    Current Outpatient Medications on File Prior to Visit  Medication Sig  . allopurinol (ZYLOPRIM) 100 MG tablet Take 1 tablet (100 mg total) by mouth daily.  Marland Kitchen aspirin 81 MG chewable tablet Chew 1 tablet (81 mg total) by mouth daily.  Marland Kitchen atorvastatin (LIPITOR) 20 MG tablet Take 1 tablet (20 mg total) by mouth daily.  . cetirizine (ZYRTEC) 10 MG tablet Take 10 mg by mouth as needed for allergies. Reported on 02/09/2015  . clopidogrel (PLAVIX) 75 MG tablet Take 1 tablet (75 mg total) by mouth daily.  . colchicine 0.6 MG tablet Take 2 tabs (1.2 mg) once dose at start of gout flare.  Then take 1 tab (0.6 mg) every 12 hours until end of gout flare or max of 14 days.  . DULoxetine (CYMBALTA) 60 MG capsule   . ezetimibe (ZETIA) 10 MG tablet Take 1 tablet (10 mg total) by mouth daily.  . meloxicam (MOBIC) 7.5 MG tablet Take 15 mg by mouth daily.   Marland Kitchen oxyCODONE-acetaminophen (PERCOCET) 10-325 MG tablet Take 1 tablet by mouth daily as needed for pain.   Marland Kitchen losartan (COZAAR) 25 MG tablet Take 1 tablet (25 mg total) by mouth daily.  Marland Kitchen omeprazole (PRILOSEC) 40 MG capsule Take 1 capsule (40 mg total) by mouth daily. (Patient not taking: Reported on 07/02/2017)   No current facility-administered medications on file prior to visit.     Review of Systems  Constitutional: Negative for activity change, appetite change, fatigue and unexpected weight change.  HENT: Negative for congestion, hearing loss and trouble swallowing.   Eyes: Negative for visual disturbance.  Respiratory: Negative for choking, shortness of breath and wheezing.   Cardiovascular: Negative for chest pain and palpitations.  Gastrointestinal: Negative for abdominal pain, blood in stool, constipation and diarrhea.  Genitourinary: Negative for  difficulty urinating, discharge, flank pain, genital sores, penile pain, penile swelling, scrotal swelling and testicular pain.  Musculoskeletal: Negative for arthralgias, back pain and myalgias.  Skin: Negative for color change, rash and wound.  Allergic/Immunologic: Negative for environmental allergies.  Neurological: Negative for dizziness, seizures, weakness and headaches.  Hematological: Negative for adenopathy. Does not bruise/bleed easily.  Psychiatric/Behavioral: Negative for behavioral problems, decreased concentration, dysphoric mood, sleep disturbance and suicidal ideas. The patient is not nervous/anxious.    Per HPI unless specifically indicated above    Objective:    BP (!) 109/55 (BP  Location: Left Arm, Patient Position: Sitting, Cuff Size: Normal)   Pulse 75   Temp 97.9 F (36.6 C) (Oral)   Ht 6\' 1"  (1.854 m)   Wt 232 lb 12.8 oz (105.6 kg)   BMI 30.71 kg/m   Wt Readings from Last 3 Encounters:  07/02/17 232 lb 12.8 oz (105.6 kg)  06/18/17 251 lb 9.6 oz (114.1 kg)  05/09/17 236 lb 9 oz (107.3 kg)    Physical Exam  Constitutional: He is oriented to person, place, and time. He appears well-developed and well-nourished. No distress.  HENT:  Head: Normocephalic and atraumatic.  Right Ear: External ear normal.  Left Ear: External ear normal.  Nose: Nose normal.  Mouth/Throat: Oropharynx is clear and moist.  Eyes: Pupils are equal, round, and reactive to light. Conjunctivae are normal.  Neck: Normal range of motion. Neck supple. No JVD present. No tracheal deviation present. No thyromegaly present.  Cardiovascular: Normal rate, regular rhythm, normal heart sounds and intact distal pulses. Exam reveals no gallop and no friction rub.  No murmur heard. Pulmonary/Chest: Effort normal and breath sounds normal. No respiratory distress.  Abdominal: Soft. Normal appearance and bowel sounds are normal. He exhibits no distension. There is no hepatosplenomegaly. There is no  tenderness.    Genitourinary:  Genitourinary Comments: Genital and Rectal Exam chaperoned by Donnie Mesa, CMA Rectal/DRE: Normal external exam without hemorrhoids fissures or abnormality. DRE with palpation of non-enlarged prostate smooth symmetrical without nodule or tenderness.   Musculoskeletal: Normal range of motion.  Lymphadenopathy:    He has no cervical adenopathy.  Neurological: He is alert and oriented to person, place, and time. No cranial nerve deficit.  Skin: Skin is warm and dry.  Psychiatric: He has a normal mood and affect. His behavior is normal. Judgment and thought content normal.  Nursing note and vitals reviewed.   Results for orders placed or performed in visit on 05/07/17  Iron and TIBC  Result Value Ref Range   Iron 111 45 - 182 ug/dL   TIBC 298 250 - 450 ug/dL   Saturation Ratios 37 17.9 - 39.5 %   UIBC 187 ug/dL  Ferritin  Result Value Ref Range   Ferritin 118 24 - 336 ng/mL  CBC with Differential/Platelet  Result Value Ref Range   WBC 7.8 3.8 - 10.6 K/uL   RBC 4.69 4.40 - 5.90 MIL/uL   Hemoglobin 14.8 13.0 - 18.0 g/dL   HCT 42.9 40.0 - 52.0 %   MCV 91.3 80.0 - 100.0 fL   MCH 31.6 26.0 - 34.0 pg   MCHC 34.6 32.0 - 36.0 g/dL   RDW 12.4 11.5 - 14.5 %   Platelets 224 150 - 440 K/uL   Neutrophils Relative % 67 %   Neutro Abs 5.3 1.4 - 6.5 K/uL   Lymphocytes Relative 23 %   Lymphs Abs 1.8 1.0 - 3.6 K/uL   Monocytes Relative 6 %   Monocytes Absolute 0.5 0.2 - 1.0 K/uL   Eosinophils Relative 3 %   Eosinophils Absolute 0.2 0 - 0.7 K/uL   Basophils Relative 1 %   Basophils Absolute 0.0 0 - 0.1 K/uL      Assessment & Plan:   Problem List Items Addressed This Visit    None    Visit Diagnoses    Encounter for general adult medical examination with abnormal findings    -  Primary   Need for Tdap vaccination       Relevant Orders   Tdap vaccine  greater than or equal to 7yo IM (Completed)   Primary osteoarthritis involving multiple joints        Relevant Medications   meloxicam (MOBIC) 7.5 MG tablet   Tick bite of abdomen, initial encounter          Physical exam with new findings of tick bite.  Well adult with no acute concerns.  Is willing to try lower dose of meloxicam to reduce GI side effect risk.  Plan: 1. Obtain health maintenance screenings as above according to age. - Labs reviewed from scanned document in May 2019.  Labs collected as part of [employer] screening.  Continues to have elevated glucose consistent with prediabetes (known diagnosis) - Recommend Shingrix x 2 doses if covered by preventive.  Instructed pt to call insurance to ask. - Increase physical activity to 30 minutes most days of the week.  - Eat healthy diet high in vegetables and fruits; low in refined carbohydrates. 2. Tick removed with forceps.  All components of tick present on removal.  Deer tick < 3 mm in size.  No current rash or bulls-eye appearance to skin.   3. Return 1 year for annual physical  - Will be Medicare Welcome visit    Meds ordered this encounter  Medications  . meloxicam (MOBIC) 7.5 MG tablet    Sig: Take 1 tablet (7.5 mg total) by mouth daily as needed for pain.    Dispense:  30 tablet    Refill:  5    Order Specific Question:   Supervising Provider    Answer:   Olin Hauser [[id number]]    Follow up plan: Return in about 1 year (around 07/03/2018), or if symptoms worsen or fail to improve, for annual physical.  Cassell Smiles, DNP, AGPCNP-BC Adult Gerontology Primary Care Nurse Practitioner [facility] 07/02/2017, 2:54 PM

## 2017-07-16 ENCOUNTER — Other Ambulatory Visit: Payer: Self-pay

## 2017-07-16 ENCOUNTER — Encounter: Payer: Self-pay | Admitting: Nurse Practitioner

## 2017-07-16 ENCOUNTER — Ambulatory Visit (INDEPENDENT_AMBULATORY_CARE_PROVIDER_SITE_OTHER): Payer: BLUE CROSS/BLUE SHIELD | Admitting: Nurse Practitioner

## 2017-07-16 ENCOUNTER — Other Ambulatory Visit: Payer: Self-pay | Admitting: Cardiovascular Disease

## 2017-07-16 VITALS — BP 114/73 | HR 76 | Temp 98.3°F | Ht 73.0 in | Wt 227.8 lb

## 2017-07-16 DIAGNOSIS — N4889 Other specified disorders of penis: Secondary | ICD-10-CM

## 2017-07-16 DIAGNOSIS — F419 Anxiety disorder, unspecified: Secondary | ICD-10-CM | POA: Diagnosis not present

## 2017-07-16 DIAGNOSIS — N529 Male erectile dysfunction, unspecified: Secondary | ICD-10-CM

## 2017-07-16 MED ORDER — DULOXETINE HCL 60 MG PO CPEP: 60.0000 mg | ORAL_CAPSULE | Freq: Two times a day (BID) | ORAL | 1 refills | Status: DC

## 2017-07-16 MED ORDER — BUSPIRONE HCL 5 MG PO TABS: 5.0000 mg | ORAL_TABLET | Freq: Two times a day (BID) | ORAL | 3 refills | Status: DC

## 2017-07-16 NOTE — Progress Notes (Signed)
Subjective:    Patient ID: Robert Greer, male    DOB: Oct 18, 1955, 62 y.o.   MRN: [id number]  Robert Greer is a 62 y.o. male presenting on 07/16/2017 for Gout, ganglion cyst, Erectile dysfunction, anxiety  HPI Gout Patient continues to have pain, redness of R great toe.  Uric acid 4.2 05/24/2017 - allopurinol 100 mg once daily.  Colchicine has helped pain, but not redness in last acute flare, beginning 06/18/2017.    Ganglion cyst Patient has seen [place] R lateral wrist ganglion cyst in past.  This has worsened again and patient would like to be referred to orthopedics.  He has seen them in past and would like to see Lennox Solders or partner.    Erectile Dysfunction Patient reports new curve in penis and inability to obtain an erection since this has occurred.  Patient recalls specific time that this began.  Problem has been present > 3 weeks.  He is able to obtain partial erection, but not full erection and only for short periods of time.  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  Patient finds himself easily irritated and with ruminative thoughts.  This is impacting his ability to interact with his family, handle workplace demands.  Small inconveniences become big stressors.  Depression screen [facility] 2/9 07/16/2017 07/02/2017 06/30/2014  Decreased Interest 1 0 0  Down, Depressed, Hopeless 1 0 0  PHQ - 2 Score 2 0 0  Altered sleeping 0 0 -  Tired, decreased energy 2 0 -  Change in appetite 1 0 -  Feeling bad or failure about yourself  2 0 -  Trouble concentrating 1 0 -  Moving slowly or fidgety/restless 0 0 -  Suicidal thoughts 0 0 -  PHQ-9 Score 8 0 -  Difficult doing work/chores Somewhat difficult Not difficult at all -    GAD 7 : Generalized Anxiety Score 07/16/2017  Nervous, Anxious, on Edge 2  Control/stop worrying 3  Worry too much - different things 3  Trouble relaxing 2  Restless 2  Easily annoyed or irritable 2  Afraid - awful  might happen 0  Total GAD 7 Score 14  Anxiety Difficulty Somewhat difficult      Social History   Tobacco Use  . Smoking status: Never Smoker  . Smokeless tobacco: Current User    Types: Snuff  Substance Use Topics  . Alcohol use: Yes    Alcohol/week: 1.2 - 1.8 oz    Types: 2 - 3 Cans of beer per week    Comment: ocassionally  . Drug use: No    Review of Systems Per HPI unless specifically indicated above     Objective:    BP 114/73 (BP Location: Right Arm, Patient Position: Sitting, Cuff Size: Large)   Pulse 76   Temp 98.3 F (36.8 C) (Oral)   Ht 6\' 1"  (1.854 m)   Wt 227 lb 12.8 oz (103.3 kg)   BMI 30.05 kg/m   Wt Readings from Last 3 Encounters:  07/16/17 227 lb 12.8 oz (103.3 kg)  07/02/17 232 lb 12.8 oz (105.6 kg)  06/18/17 251 lb 9.6 oz (114.1 kg)    Physical Exam  Constitutional: He is oriented to person, place, and time. He appears well-developed and well-nourished. No distress.  HENT:  Head: Normocephalic and atraumatic.  Cardiovascular: Normal rate, regular rhythm, S1 normal, S2 normal, normal heart sounds and intact distal pulses.  Pulmonary/Chest: Effort normal and breath sounds  normal. No respiratory distress.  Genitourinary:  Genitourinary Comments: Deferred to urology  Musculoskeletal:       Feet:  Neurological: He is alert and oriented to person, place, and time.  Skin: Skin is warm and dry. Capillary refill takes less than 2 seconds.  Psychiatric: His behavior is normal. Judgment and thought content normal. His mood appears anxious. His speech is rapid and/or pressured and tangential.  Vitals reviewed.   Results for orders placed or performed in visit on 05/07/17  Iron and TIBC  Result Value Ref Range   Iron 111 45 - 182 ug/dL   TIBC 298 250 - 450 ug/dL   Saturation Ratios 37 17.9 - 39.5 %   UIBC 187 ug/dL  Ferritin  Result Value Ref Range   Ferritin 118 24 - 336 ng/mL  CBC with Differential/Platelet  Result Value Ref Range   WBC 7.8  3.8 - 10.6 K/uL   RBC 4.69 4.40 - 5.90 MIL/uL   Hemoglobin 14.8 13.0 - 18.0 g/dL   HCT 42.9 40.0 - 52.0 %   MCV 91.3 80.0 - 100.0 fL   MCH 31.6 26.0 - 34.0 pg   MCHC 34.6 32.0 - 36.0 g/dL   RDW 12.4 11.5 - 14.5 %   Platelets 224 150 - 440 K/uL   Neutrophils Relative % 67 %   Neutro Abs 5.3 1.4 - 6.5 K/uL   Lymphocytes Relative 23 %   Lymphs Abs 1.8 1.0 - 3.6 K/uL   Monocytes Relative 6 %   Monocytes Absolute 0.5 0.2 - 1.0 K/uL   Eosinophils Relative 3 %   Eosinophils Absolute 0.2 0 - 0.7 K/uL   Basophils Relative 1 %   Basophils Absolute 0.0 0 - 0.1 K/uL      Assessment & Plan:   Problem List Items Addressed This Visit      Other   Anxiety - Primary Currently uncontrolled.  Patient with significant life stressors and inadequate coping skills.  Patient desires to start medications and consider counseling for stress management strategies.  Plan: 1. START buspirone 5 mg bid. 2. Continue duloxetine 60 mg one cap twice daily. 3. Provided psychology references for counseling. 4. Followup 6 weeks.   Relevant Medications   DULoxetine (CYMBALTA) 60 MG capsule    Other Visit Diagnoses    Acquired curvature of penis       Erectile dysfunction, unspecified erectile dysfunction type     Patient with likely penile fracture after prior sexual encounter as patient has very distinct timeline when ED began, which corresponds to new curvature of penis.  Exam deferred.   Plan: 1. Referral urology. 2. Follow-up prn.   Relevant Orders   Ambulatory referral to Urology      Meds ordered this encounter  Medications  . DULoxetine (CYMBALTA) 60 MG capsule    Sig: Take 1 capsule (60 mg total) by mouth 2 (two) times daily.    Dispense:  180 capsule    Refill:  1    Order Specific Question:   Supervising Provider    Answer:   Olin Hauser [[id number]]  . busPIRone (BUSPAR) 5 MG tablet    Sig: Take 1 tablet (5 mg total) by mouth 2 (two) times daily.    Dispense:  60 tablet     Refill:  3    Order Specific Question:   Supervising Provider    Answer:   Olin Hauser [[id number]]    Follow up plan: Return in about 6 weeks (around 08/27/2017)  for anxiety.  Cassell Smiles, DNP, AGPCNP-BC Adult Gerontology Primary Care Nurse Practitioner [facility] 07/16/2017, 4:27 PM

## 2017-07-16 NOTE — Patient Instructions (Addendum)
Robert Greer,   Thank you for coming in to clinic today.  1. Psych Counseling ONLY   Self Referral: 1. [facility] [place]   Address: [place], [place], [place] [address] Hours: Open today  9AM-7PM Phone: [phone]  2. [place] [place] Address: [address] [place], [place], [place] [address] Phone: [phone]    2. START buspirone 5 mg one tablet twice daily. 3. Continue duloxetine 60 mg one tablet twice daily.  Please schedule a follow-up appointment with Cassell Smiles, AGNP. Return in about 6 weeks (around 08/27/2017) for anxiety.  If you have any other questions or concerns, please feel free to call the clinic or send a message through [place]. You may also schedule an earlier appointment if necessary.  You will receive a survey after today's visit either digitally by e-mail or paper by [employer]. Your experiences and feedback matter to [place].  Please respond so we know how we are doing as we provide care for you.   Cassell Smiles, DNP, AGNP-BC Adult Gerontology Nurse Practitioner [facility]   Serotonin Syndrome Serotonin is a brain chemical that regulates the nervous system, which includes the brain, spinal cord, and nerves. Serotonin appears to play a role in all types of behavior, including appetite, emotions, movement, thinking, and response to stress. Excessively high levels of serotonin in the body can cause serotonin syndrome, which is a very dangerous condition. What are the causes? This condition can be caused by taking medicines or drugs that increase the level of serotonin in your body. These include:  Antidepressant medicines.  Migraine medicines.  Certain pain medicines.  Certain recreational drugs, including ecstasy, LSD, cocaine, and amphetamines.  Over-the-counter cough or cold medicines that contain dextromethorphan.  Certain herbal supplements, including St. John's wort,  ginseng, and nutmeg.  This condition usually occurs when you take these medicines or drugs in combination, but it can also happen with a high dose of a single medicine or drug. What increases the risk? This condition is more likely to develop in:  People who have recently increased the dosage of medicine that increases the serotonin level.  People who just started taking medicine that increases the serotonin level.  What are the signs or symptoms? Symptoms of this condition usually happens within several hours of a medicine change. Symptoms include:  Headache.  Muscle twitching or stiffness.  Diarrhea.  Confusion.  Restlessness or agitation.  Shivering or goose bumps.  Loss of muscle coordination.  Rapid heart rate.  Sweating.  Severe cases of serotonin syndromecan cause:  Irregular heartbeat.  Seizures.  Loss of consciousness.  High fever.  How is this diagnosed? This condition is diagnosed with a medical history and physical exam. You will be asked aboutyour symptoms and your use of medicines and recreational drugs. Your health care provider may also order lab work or additional tests to rule out other causes of your symptoms. How is this treated? The treatment for this condition depends on the severity of your symptoms. For mild cases, stopping the medicine that caused your condition is usually all that is needed. For moderate to severe cases, hospitalization is required to monitor you and to prevent further muscle damage. Follow these instructions at home:  Take over-the-counter and prescription medicines only as told by your health care provider. This is important.  Check with your health care provider before you start taking any new prescriptions, over-the-counter  medicines, herbs, or supplements.  Avoid combining any medicines that can cause this condition to occur.  Keep all follow-up visits as told by your health care provider.This is  important.  Maintain a healthy lifestyle. ? Eat healthy foods. ? Get plenty of sleep. ? Exercise regularly. ? Do not drink alcohol. ? Do not use recreational drugs. Contact a health care provider if:  Medicines do not seem to be helping.  Your symptoms do not improve or they get worse.  You have trouble taking care of yourself. Get help right away if:  You have worsening confusion, severe headache, chest pain, high fever, seizures, or loss of consciousness.  You have serious thoughts about hurting yourself or others.  You experience serious side effects of medicine, such as swelling of your face, lips, tongue, or throat. This information is not intended to replace advice given to you by your health care provider. Make sure you discuss any questions you have with your health care provider. Document Released: 02/16/2004 Document Revised: 09/03/2015 Document Reviewed: 01/21/2014 Elsevier Interactive Patient Education  [employer].

## 2017-07-31 ENCOUNTER — Telehealth: Payer: Self-pay

## 2017-07-31 ENCOUNTER — Telehealth: Payer: Self-pay | Admitting: Nurse Practitioner

## 2017-07-31 DIAGNOSIS — F419 Anxiety disorder, unspecified: Secondary | ICD-10-CM

## 2017-07-31 MED ORDER — HYDROXYZINE HCL 25 MG PO TABS: 12.5000 mg | ORAL_TABLET | Freq: Three times a day (TID) | ORAL | 1 refills | Status: DC | PRN

## 2017-07-31 NOTE — Telephone Encounter (Signed)
The pt called stating he had to discontinue the Buspar, because it mad his depression worse. He took the medication for 3 days and stopped. He would like to try something different. The pt state the Cymbalta works well, but he need something just on a as needed basis when he haves bad days. Please advise

## 2017-07-31 NOTE — Telephone Encounter (Signed)
Pt asked for a call back to discuss medication [phone]

## 2017-07-31 NOTE — Telephone Encounter (Signed)
Patient may try hydroxyzine 25 mg tablet.  Take 1/2-1 tab up to three times daily for anxiety.   - PLEASE caution drowsiness.  Should be taken when not using heavy equipment or driving.  This is most important for first several doses until he knows how he will react.

## 2017-07-31 NOTE — Telephone Encounter (Signed)
The pt was notified. No questions or concerns. 

## 2017-07-31 NOTE — Telephone Encounter (Signed)
pateint call back advised as per Cassell Smiles DNP.

## 2017-08-16 ENCOUNTER — Inpatient Hospital Stay: Payer: BLUE CROSS/BLUE SHIELD | Attending: Oncology

## 2017-08-16 DIAGNOSIS — R5383 Other fatigue: Secondary | ICD-10-CM | POA: Insufficient documentation

## 2017-08-19 ENCOUNTER — Inpatient Hospital Stay (HOSPITAL_BASED_OUTPATIENT_CLINIC_OR_DEPARTMENT_OTHER): Payer: BLUE CROSS/BLUE SHIELD | Admitting: Oncology

## 2017-08-19 ENCOUNTER — Inpatient Hospital Stay: Payer: BLUE CROSS/BLUE SHIELD

## 2017-08-19 ENCOUNTER — Other Ambulatory Visit: Payer: Self-pay

## 2017-08-19 ENCOUNTER — Encounter: Payer: Self-pay | Admitting: Oncology

## 2017-08-19 DIAGNOSIS — R5383 Other fatigue: Secondary | ICD-10-CM | POA: Diagnosis not present

## 2017-08-19 LAB — CBC WITH DIFFERENTIAL/PLATELET
BASOS ABS: 0.1 10*3/uL (ref 0–0.1)
BASOS PCT: 1 %
EOS PCT: 2 %
Eosinophils Absolute: 0.3 10*3/uL (ref 0–0.7)
HCT: 45.2 % (ref 40.0–52.0)
Hemoglobin: 15.4 g/dL (ref 13.0–18.0)
Lymphocytes Relative: 25 %
Lymphs Abs: 2.9 10*3/uL (ref 1.0–3.6)
MCH: 32.4 pg (ref 26.0–34.0)
MCHC: 34.2 g/dL (ref 32.0–36.0)
MCV: 94.8 fL (ref 80.0–100.0)
MONO ABS: 0.8 10*3/uL (ref 0.2–1.0)
Monocytes Relative: 7 %
Neutro Abs: 7.4 10*3/uL — ABNORMAL HIGH (ref 1.4–6.5)
Neutrophils Relative %: 65 %
Platelets: 286 10*3/uL (ref 150–440)
RBC: 4.77 MIL/uL (ref 4.40–5.90)
RDW: 13.1 % (ref 11.5–14.5)
WBC: 11.4 10*3/uL — AB (ref 3.8–10.6)

## 2017-08-19 LAB — IRON AND TIBC
IRON: 122 ug/dL (ref 45–182)
Saturation Ratios: 35 % (ref 17.9–39.5)
TIBC: 352 ug/dL (ref 250–450)
UIBC: 230 ug/dL

## 2017-08-19 LAB — TSH: TSH: 3.31 u[IU]/mL (ref 0.350–4.500)

## 2017-08-19 LAB — FERRITIN: FERRITIN: 125 ng/mL (ref 24–336)

## 2017-08-19 NOTE — Progress Notes (Signed)
Patient here for follow up. No concerns voiced.  °

## 2017-08-20 NOTE — Progress Notes (Signed)
Hematology/Oncology Follow up note [facility] [phone] [phone]   Patient Care Team: Arlis Porta., MD as PCP - General (Unknown Physician Specialty)  REFERRING PROVIDER: Dr.Rabinowitz REASON FOR VISIT Follow up for treatment of hemochromotosis  HISTORY OF PRESENTING ILLNESS:  Robert Greer is a  62 y.o.  male with PMH listed below who was referred to me for evaluation of  hemochromatosis. Patient is a Airline pilot. Patient was seen at [facility]. He had some lab work done. His iron has been persistently elevated. Most recent serum iron level is 196. Ferritin is elevated at 510. Hemochromatosis mutation PCR was checked. Patient is positive for compound heterozygous with C2 82Y/H63D. Patient also has mild elevation of transaminitis ALT is elevated at 47, normal AST 32. LDH 207. Creatinine 1.07. TSH is elevated at 5.6 hemoglobin 15, platelet 273, WBC 7.8. Elevated eosinophil count on differential at 0.5. Rest differential was normal.  #Patient also has been seen and evaluated by Dr. Vicente Males, recommending EGD and colonoscopy for evaluation of black tarry stool  And colorectal cancer screening. 02/19/2017 normal examination of colonoscopy. Upper Endoscopy showed gastritis.   INTERVAL HISTORY Robert Greer is a 62 y.o. male who has above history reviewed by me today presents for follow-up visit for hemochromatosis/iron overload.  # Patient reports feeling at baseline.  He did not have labs done prior to this visit. Continue to feel fatigued and attributes to aging. Chronic multiple joint pain otherwise no new complaints. Review of Systems  Constitutional: Positive for malaise/fatigue. Negative for chills, diaphoresis, fever and weight loss.  HENT: Negative for hearing loss, nosebleeds and sore throat.   Eyes: Negative for blurred vision, double vision, photophobia, pain, discharge and redness.  Respiratory:  Negative for cough, hemoptysis, sputum production, shortness of breath and wheezing.   Cardiovascular: Negative for chest pain, palpitations and orthopnea.  Gastrointestinal: Negative for abdominal pain, blood in stool, diarrhea, heartburn, melena, nausea and vomiting.  Genitourinary: Negative for dysuria, frequency and urgency.  Musculoskeletal: Positive for joint pain. Negative for back pain, myalgias and neck pain.  Skin: Negative for itching and rash.  Neurological: Negative for dizziness, tingling, tremors and sensory change (.ros).  Endo/Heme/Allergies: Negative for environmental allergies. Does not bruise/bleed easily.  Psychiatric/Behavioral: Negative for depression, substance abuse and suicidal ideas. The patient is not nervous/anxious.    MEDICAL HISTORY:  Past Medical History:  Diagnosis Date  . Anxiety   . Arthritis   . Backache, unspecified   . Depressive disorder, not elsewhere classified   . Gout   . Hyperlipidemia   . Hypertension   . Measles   . Mobitz (type) II atrioventricular block   . Mumps   . Other hemochromatosis 12/05/2016  . Sleep apnea     SURGICAL HISTORY: Past Surgical History:  Procedure Laterality Date  . ANTERIOR FUSION LUMBAR SPINE    . BACK SURGERY  2012   x2  . CARDIAC CATHETERIZATION N/A 01/10/2015   Procedure: Left Heart Cath and Coronary Angiography;  Surgeon: Wellington Hampshire, MD;  Location: [place] [facility];  Service: Cardiovascular;  Laterality: N/A;  . CARDIAC CATHETERIZATION N/A 01/10/2015   Procedure: Coronary Stent Intervention;  Surgeon: Wellington Hampshire, MD;  Location: [place] [facility];  Service: Cardiovascular;  Laterality: N/A;  . COLONOSCOPY  2005  . COLONOSCOPY WITH PROPOFOL N/A 02/14/2017   Procedure: COLONOSCOPY WITH PROPOFOL;  Surgeon: [name], MD;  Location: [facility] ENDOSCOPY;  Service: Gastroenterology;  Laterality:  N/A;  . ESOPHAGOGASTRODUODENOSCOPY (EGD) WITH PROPOFOL N/A 02/14/2017   Procedure:  ESOPHAGOGASTRODUODENOSCOPY (EGD) WITH PROPOFOL;  Surgeon: [name], MD;  Location: [facility] ENDOSCOPY;  Service: Gastroenterology;  Laterality: N/A;  . KNEE SURGERY     bilateral  . TONSILLECTOMY AND ADENOIDECTOMY    . TOTAL SHOULDER REPLACEMENT Right     SOCIAL HISTORY: Social History   Socioeconomic History  . Marital status: Married    Spouse name: Not on file  . Number of children: Not on file  . Years of education: Not on file  . Highest education level: Not on file  Occupational History  . Not on file  Social Needs  . Financial resource strain: Not on file  . Food insecurity:    Worry: Not on file    Inability: Not on file  . Transportation needs:    Medical: Not on file    Non-medical: Not on file  Tobacco Use  . Smoking status: Never Smoker  . Smokeless tobacco: Current User    Types: Snuff  Substance and Sexual Activity  . Alcohol use: Yes    Alcohol/week: 1.2 - 1.8 oz    Types: 2 - 3 Cans of beer per week    Comment: ocassionally  . Drug use: No  . Sexual activity: Yes  Lifestyle  . Physical activity:    Days per week: Not on file    Minutes per session: Not on file  . Stress: Not on file  Relationships  . Social connections:    Talks on phone: Not on file    Gets together: Not on file    Attends religious service: Not on file    Active member of club or organization: Not on file    Attends meetings of clubs or organizations: Not on file    Relationship status: Not on file  . Intimate partner violence:    Fear of current or ex partner: Not on file    Emotionally abused: Not on file    Physically abused: Not on file    Forced sexual activity: Not on file  Other Topics Concern  . Not on file  Social History Narrative  . Not on file    FAMILY HISTORY: Family History  Problem Relation Age of Onset  . Stroke Mother   . Liver disease Father   . Lung cancer Sister   . Diabetes Sister   . Liver disease Brother   . Lung cancer Paternal Grandmother      ALLERGIES:  has No Known Allergies.  MEDICATIONS:  Current Outpatient Medications  Medication Sig Dispense Refill  . allopurinol (ZYLOPRIM) 100 MG tablet Take 1 tablet (100 mg total) by mouth daily. 90 tablet 1  . aspirin 81 MG chewable tablet Chew 1 tablet (81 mg total) by mouth daily. 30 tablet 3  . atorvastatin (LIPITOR) 20 MG tablet Take 1 tablet (20 mg total) by mouth daily. 90 tablet 0  . cetirizine (ZYRTEC) 10 MG tablet Take 10 mg by mouth as needed for allergies. Reported on 02/09/2015    . clopidogrel (PLAVIX) 75 MG tablet Take 1 tablet (75 mg total) by mouth daily. 90 tablet 3  . Colchicine (MITIGARE) 0.6 MG CAPS Take by mouth.    . colchicine 0.6 MG tablet Take 2 tabs (1.2 mg) once dose at start of gout flare.  Then take 1 tab (0.6 mg) every 12 hours until end of gout flare or max of 14 days. 30 tablet 1  . DULoxetine (CYMBALTA)  60 MG capsule Take 1 capsule (60 mg total) by mouth 2 (two) times daily. 180 capsule 1  . ezetimibe (ZETIA) 10 MG tablet Take 1 tablet (10 mg total) by mouth daily. 30 tablet 3  . hydrOXYzine (ATARAX/VISTARIL) 25 MG tablet Take 0.5-1 tablets (12.5-25 mg total) by mouth 3 (three) times daily as needed for anxiety. 30 tablet 1  . omeprazole (PRILOSEC) 40 MG capsule Take 1 capsule (40 mg total) by mouth daily. 90 capsule 3  . oxyCODONE-acetaminophen (PERCOCET) 10-325 MG tablet Take 1 tablet by mouth daily as needed for pain.     Marland Kitchen tiZANidine (ZANAFLEX) 4 MG capsule Take 4 mg by mouth 3 (three) times daily as needed.    Marland Kitchen losartan (COZAAR) 25 MG tablet Take 1 tablet (25 mg total) by mouth daily. 90 tablet 3  . meloxicam (MOBIC) 7.5 MG tablet Take 1 tablet (7.5 mg total) by mouth daily as needed for pain. (Patient not taking: Reported on 07/16/2017) 30 tablet 5   No current facility-administered medications for this visit.       Marland Kitchen  PHYSICAL EXAMINATION: ECOG PERFORMANCE STATUS: 0 - Asymptomatic Vitals:   08/19/17 1510  BP: 109/69  Pulse: 92  Resp:  18  Temp: 97.7 F (36.5 C)   Filed Weights   08/19/17 1510  Weight: 237 lb 4.8 oz (107.6 kg)   Physical Exam  Constitutional: He is oriented to person, place, and time. He appears well-developed and well-nourished. No distress.  HENT:  Head: Normocephalic and atraumatic.  Mouth/Throat: Oropharynx is clear and moist.  Eyes: Pupils are equal, round, and reactive to light. Conjunctivae and EOM are normal. No scleral icterus.  Neck: Normal range of motion. Neck supple.  Cardiovascular: Normal rate, regular rhythm and normal heart sounds.  Pulmonary/Chest: Effort normal. No respiratory distress. He has no wheezes. He has no rales. He exhibits no tenderness.  Abdominal: Soft. Bowel sounds are normal. He exhibits no distension and no mass. There is no tenderness.  Musculoskeletal: Normal range of motion. He exhibits no edema or deformity.  Lymphadenopathy:    He has no cervical adenopathy.  Neurological: He is alert and oriented to person, place, and time. No cranial nerve deficit. Coordination normal.  Skin: Skin is warm and dry. No rash noted.  Psychiatric: He has a normal mood and affect. His behavior is normal. Thought content normal.      LABORATORY DATA:  I have reviewed the data as listed Lab Results  Component Value Date   WBC 11.4 (H) 08/19/2017   HGB 15.4 08/19/2017   HCT 45.2 08/19/2017   MCV 94.8 08/19/2017   PLT 286 08/19/2017   Recent Labs    11/08/16 1005 12/04/16 1115  NA 141 137  K 4.3 4.7  CL 106 102  CO2 25 25  GLUCOSE 105* 108*  BUN 20 24*  CREATININE 1.10 1.16  CALCIUM 9.8 9.3  GFRNONAA >60 >60  GFRAA >60 >60  PROT  --  8.1  ALBUMIN  --  4.5  AST  --  27  ALT  --  38  ALKPHOS  --  62  BILITOT  --  0.7   ASSESSMENT & PLAN:  1. Hereditary hemochromatosis ([place])   2. Iron overload   3. Other fatigue    #As patient did not have labs done prior to today's visit, he is going to have CBC, iron, TIBC, ferritin checked today.  Labs reviewed.   Ferritin 125, saturation ratio 35.  Hemoglobin 15.4. Plan phlebotomy  500 mL x 1 # Fatigue: check TSH.  TSH is normal.   Repeat CBC, iron, TIBC, ferritin in 3 months. Orders Placed This Encounter  Procedures  . CBC with Differential/Platelet    Standing Status:   Future    Number of Occurrences:   1    Standing Expiration Date:   08/20/2018  . TSH    Standing Status:   Future    Number of Occurrences:   1    Standing Expiration Date:   08/20/2018  . CBC with Differential/Platelet    Standing Status:   Future    Number of Occurrences:   1    Standing Expiration Date:   08/20/2018  . Ferritin    Standing Status:   Future    Number of Occurrences:   1    Standing Expiration Date:   08/20/2018  . Iron and TIBC    Standing Status:   Future    Number of Occurrences:   1    Standing Expiration Date:   08/20/2018  . Iron and TIBC    Standing Status:   Future    Standing Expiration Date:   08/21/2018  . Ferritin    Standing Status:   Future    Standing Expiration Date:   08/21/2018   All questions were answered. The patient knows to call the clinic with any problems questions or concerns.  Return of visit: 3 months with labs done prior to the visit.  Earlie Server, MD, PhD Hematology Oncology [facility] Pager- [phone] 08/20/2017

## 2017-08-21 ENCOUNTER — Telehealth: Payer: Self-pay | Admitting: Oncology

## 2017-08-21 NOTE — Telephone Encounter (Signed)
Attempts to contact pt have been unanswered. I left VM at both numbers listed yesterday and this morning for pt to contact office to schedule phlebotomy per Dr. Tasia Catchings.

## 2017-08-22 DIAGNOSIS — H5203 Hypermetropia, bilateral: Secondary | ICD-10-CM | POA: Diagnosis not present

## 2017-08-23 ENCOUNTER — Inpatient Hospital Stay: Payer: Medicare Other

## 2017-09-03 DIAGNOSIS — M47816 Spondylosis without myelopathy or radiculopathy, lumbar region: Secondary | ICD-10-CM | POA: Diagnosis not present

## 2017-09-03 DIAGNOSIS — M48062 Spinal stenosis, lumbar region with neurogenic claudication: Secondary | ICD-10-CM | POA: Diagnosis not present

## 2017-09-06 ENCOUNTER — Encounter: Payer: Self-pay | Admitting: Urology

## 2017-09-06 ENCOUNTER — Ambulatory Visit: Payer: BLUE CROSS/BLUE SHIELD | Admitting: Urology

## 2017-09-16 ENCOUNTER — Other Ambulatory Visit: Payer: Self-pay | Admitting: Oncology

## 2017-09-16 ENCOUNTER — Inpatient Hospital Stay: Payer: BLUE CROSS/BLUE SHIELD

## 2017-09-16 ENCOUNTER — Ambulatory Visit: Payer: BLUE CROSS/BLUE SHIELD | Admitting: Gastroenterology

## 2017-09-16 ENCOUNTER — Encounter: Payer: Self-pay | Admitting: Gastroenterology

## 2017-09-16 NOTE — Progress Notes (Signed)
[name] MD, MRCP(U.K) [address]  [place]  [place], [place] [address]  Main: [phone]  Fax: [phone]   Primary Care Physician: Arlis Porta., MD  Primary Gastroenterologist:  Dr. [name]   Chief Complaint  Patient presents with  . Follow-up    Iron Overload    HPI: Wilmar Prabhakar Bakos is a 62 y.o. male   Summary of history :  He is here to follow up for  for an elevated iron level.  He is positive for compound heterozygous with C2 82Y/H63D. He undergoes phlebotomy withDr Tasia Catchings  02/14/17- EGD bx show moderate chronic gastritis. Colonoscopy -normal   Interval history    03/18/2017 to 09/16/2017   Labs 08/19/2017.  Hemoglobin 15.4 g, ferritin 125, iron percentage saturation 35%.  TSH elevated, low free T4.  Hepatitis B surface HCV FibroSure shows no fibrosis antibody positive, no inflammatory activity.  Hepatitis C antibody hepatitis A total antibody not checked.  Undergoing phlebotomy with Dr. Tasia Catchings and hematology.  Hepatic Function Latest Ref Rng & Units 12/04/2016  Total Protein 6.5 - 8.1 g/dL 8.1  Albumin 3.5 - 5.0 g/dL 4.5  AST 15 - 41 U/L 27  ALT 17 - 63 U/L 38  Alk Phosphatase 38 - 126 U/L 62  Total Bilirubin 0.3 - 1.2 mg/dL 0.7    Still has back pain. Fatigue is better.  Stopped all meloxicam use. Takes oxycodone as needed.Brown stool.    Current Outpatient Medications  Medication Sig Dispense Refill  . allopurinol (ZYLOPRIM) 100 MG tablet Take 1 tablet (100 mg total) by mouth daily. 90 tablet 1  . aspirin 81 MG chewable tablet Chew 1 tablet (81 mg total) by mouth daily. 30 tablet 3  . atorvastatin (LIPITOR) 20 MG tablet Take 1 tablet (20 mg total) by mouth daily. 90 tablet 0  . cetirizine (ZYRTEC) 10 MG tablet Take 10 mg by mouth as needed for allergies. Reported on 02/09/2015    . clopidogrel (PLAVIX) 75 MG tablet Take 1 tablet (75 mg total) by mouth daily. 90 tablet 3  . Colchicine (MITIGARE) 0.6 MG CAPS Take by mouth.    . colchicine 0.6  MG tablet Take 2 tabs (1.2 mg) once dose at start of gout flare.  Then take 1 tab (0.6 mg) every 12 hours until end of gout flare or max of 14 days. 30 tablet 1  . DULoxetine (CYMBALTA) 60 MG capsule Take 1 capsule (60 mg total) by mouth 2 (two) times daily. 180 capsule 1  . esomeprazole (NEXIUM) 40 MG capsule     . ezetimibe (ZETIA) 10 MG tablet Take 1 tablet (10 mg total) by mouth daily. 30 tablet 3  . hydrOXYzine (ATARAX/VISTARIL) 25 MG tablet Take 0.5-1 tablets (12.5-25 mg total) by mouth 3 (three) times daily as needed for anxiety. 30 tablet 1  . omeprazole (PRILOSEC) 40 MG capsule Take 1 capsule (40 mg total) by mouth daily. 90 capsule 3  . oxyCODONE-acetaminophen (PERCOCET) 10-325 MG tablet Take 1 tablet by mouth daily as needed for pain.     Marland Kitchen losartan (COZAAR) 25 MG tablet Take 1 tablet (25 mg total) by mouth daily. 90 tablet 3  . meloxicam (MOBIC) 7.5 MG tablet Take 1 tablet (7.5 mg total) by mouth daily as needed for pain. (Patient not taking: Reported on 07/16/2017) 30 tablet 5  . tiZANidine (ZANAFLEX) 4 MG capsule Take 4 mg by mouth 3 (three) times daily as needed.     No current facility-administered medications  for this visit.     Allergies as of 09/16/2017  . (No Known Allergies)    ROS:  General: Negative for anorexia, weight loss, fever, chills, fatigue, weakness. ENT: Negative for hoarseness, difficulty swallowing , nasal congestion. CV: Negative for chest pain, angina, palpitations, dyspnea on exertion, peripheral edema.  Respiratory: Negative for dyspnea at rest, dyspnea on exertion, cough, sputum, wheezing.  GI: See history of present illness. GU:  Negative for dysuria, hematuria, urinary incontinence, urinary frequency, nocturnal urination.  Endo: Negative for unusual weight change.    Physical Examination:   BP 118/72   Pulse 98   Ht _0  (1.854 m)   Wt 239 lb 6.4 oz (108.6 kg)   BMI 31.59 kg/m   General: Well-nourished, well-developed in no acute  distress.  Eyes: No icterus. Conjunctivae pink. Mouth: Oropharyngeal mucosa moist and pink , no lesions erythema or exudate. Lungs: Clear to auscultation bilaterally. Non-labored. Heart: Regular rate and rhythm, no murmurs rubs or gallops.  Abdomen: Bowel sounds are normal, nontender, nondistended, no hepatosplenomegaly or masses, no abdominal bruits or hernia , no rebound or guarding.   Extremities: No lower extremity edema. No clubbing or deformities. Neuro: Alert and oriented x 3.  Grossly intact. Skin: Warm and dry, no jaundice.   Psych: Alert and cooperative, normal mood and affect.   Imaging Studies: No results found.  Assessment and Plan:   Abhay Godbolt Warzecha is a 62 y.o. y/o male  here to follow up for  Hemochromatosis .He is aC282Y/H63Dcompound heterozygote genotypeand they usually have a milder form of disease . Marland Kitchen No biochemical evidence of liver cirrhosis. He is immune to hepatitis B.  Plan 1. Suggestlimit the intake of ethanol, avoid taking iron or vitamin Csupplements, and avoid ingestion of uncooked seafooddue to risk of vibrio vulnificus infection  2.  Check for hepatitis A antibody and hepatitis C antibody, HIV.  Will need annual flu shot, pneumonia , Tdap vaccine withHawkins, Ronelle Nigh., MD. IF not immune to hepA/B will need to be immunized. Check LFT;s and INR 3. Trial of D/c PPI  4. F/u with Dr Tasia Catchings - he says he is due for phlebotomy and was supposed to hear back for an appoiintment . I will message Dr Tasia Catchings.   Dr [name]  MD,MRCP [facility]) Follow up in 8 months

## 2017-09-17 ENCOUNTER — Other Ambulatory Visit: Payer: Self-pay | Admitting: *Deleted

## 2017-09-20 ENCOUNTER — Telehealth: Payer: Self-pay | Admitting: Oncology

## 2017-09-20 ENCOUNTER — Telehealth: Payer: Self-pay

## 2017-09-20 NOTE — Telephone Encounter (Signed)
I have made several attempts and left messages for pt to call me back to resch the phlebotomy appt. To no avail I have not received a call back.

## 2017-09-20 NOTE — Telephone Encounter (Signed)
Spoke with pt son, Konrad Dolores and informed him that we have been unable to contact pt. He states he will contact pt and have him call our office.

## 2017-09-24 ENCOUNTER — Encounter: Payer: Self-pay | Admitting: *Deleted

## 2017-09-25 ENCOUNTER — Encounter: Payer: Self-pay | Admitting: Gastroenterology

## 2017-09-25 LAB — HEPATIC FUNCTION PANEL
ALT: 25 IU/L (ref 0–44)
AST: 29 IU/L (ref 0–40)
Albumin: 4.6 g/dL (ref 3.6–4.8)
Alkaline Phosphatase: 67 IU/L (ref 39–117)
BILIRUBIN TOTAL: 0.3 mg/dL (ref 0.0–1.2)
BILIRUBIN, DIRECT: 0.1 mg/dL (ref 0.00–0.40)
Total Protein: 7.3 g/dL (ref 6.0–8.5)

## 2017-09-25 LAB — HIV ANTIBODY (ROUTINE TESTING W REFLEX): HIV Screen 4th Generation wRfx: NONREACTIVE

## 2017-09-25 LAB — PROTIME-INR

## 2017-09-25 LAB — HEPATITIS C ANTIBODY: Hep C Virus Ab: 0.4 s/co ratio (ref 0.0–0.9)

## 2017-09-25 LAB — HEPATITIS A ANTIBODY, TOTAL: Hep A Total Ab: NEGATIVE

## 2017-10-01 ENCOUNTER — Other Ambulatory Visit: Payer: Self-pay

## 2017-10-01 ENCOUNTER — Telehealth: Payer: Self-pay

## 2017-10-01 DIAGNOSIS — K769 Liver disease, unspecified: Secondary | ICD-10-CM

## 2017-10-01 NOTE — Telephone Encounter (Signed)
I was able to contact pt to notify him of lab results, inform him to return for Hep A vaccine series, and to have PT INR collected. Pt is aware and will be returning to our office for a nurse visit on 10-02-17.

## 2017-10-01 NOTE — Telephone Encounter (Signed)
-----   Message from [name], MD sent at 09/25/2017  9:04 AM EDT ----- Inform he needs to come into the office for hepatitis A vaccine series to be given by nurse.

## 2017-10-02 ENCOUNTER — Telehealth: Payer: Self-pay | Admitting: Gastroenterology

## 2017-10-02 NOTE — Telephone Encounter (Signed)
PT IS CALLING TO LET NURSE KNOW HE CAN NOT COME IN TODAY TO DO HIS LAB WORK AND SHOT PLEASE CALL PT

## 2017-10-03 ENCOUNTER — Ambulatory Visit: Payer: BLUE CROSS/BLUE SHIELD | Admitting: Nurse Practitioner

## 2017-10-06 ENCOUNTER — Other Ambulatory Visit: Payer: Self-pay | Admitting: Cardiovascular Disease

## 2017-10-08 NOTE — Telephone Encounter (Signed)
Called pt in regards to rescheduling visit for labs. LVM to return call

## 2017-10-15 ENCOUNTER — Encounter: Payer: Self-pay | Admitting: Nurse Practitioner

## 2017-10-16 DIAGNOSIS — N486 Induration penis plastica: Secondary | ICD-10-CM | POA: Diagnosis not present

## 2017-11-06 DIAGNOSIS — H5319 Other subjective visual disturbances: Secondary | ICD-10-CM | POA: Diagnosis not present

## 2017-11-06 DIAGNOSIS — H43811 Vitreous degeneration, right eye: Secondary | ICD-10-CM | POA: Diagnosis not present

## 2017-11-15 ENCOUNTER — Inpatient Hospital Stay: Payer: BLUE CROSS/BLUE SHIELD

## 2017-11-18 ENCOUNTER — Inpatient Hospital Stay: Payer: BLUE CROSS/BLUE SHIELD

## 2017-11-18 ENCOUNTER — Telehealth: Payer: Self-pay | Admitting: *Deleted

## 2017-11-18 ENCOUNTER — Other Ambulatory Visit: Payer: Self-pay

## 2017-11-18 ENCOUNTER — Inpatient Hospital Stay: Payer: BLUE CROSS/BLUE SHIELD | Admitting: Oncology

## 2017-11-18 MED ORDER — LOSARTAN POTASSIUM 25 MG PO TABS: 25.0000 mg | ORAL_TABLET | Freq: Every day | ORAL | 0 refills | Status: DC

## 2017-11-18 NOTE — Telephone Encounter (Signed)
Patient was scheduled for Lab appt on Friday, 11/15/17.  He was reminded by the automated phone tree and he spoke with Benjamine Mola who also reminded him of his appts.  He missed this appt.    Patient was scheduled for Md & Phlebotomy visit for today.  Per appointment desk notes, appointments were cancelled by Jordan Hawks today stating "patient sick will call back to reschedule when he knows work schedule".

## 2017-11-18 NOTE — Telephone Encounter (Signed)
FYI Patient is not complaint with labs and phlebotomy.

## 2017-12-10 DIAGNOSIS — M47816 Spondylosis without myelopathy or radiculopathy, lumbar region: Secondary | ICD-10-CM | POA: Diagnosis not present

## 2017-12-10 DIAGNOSIS — M48062 Spinal stenosis, lumbar region with neurogenic claudication: Secondary | ICD-10-CM | POA: Diagnosis not present

## 2017-12-17 ENCOUNTER — Other Ambulatory Visit: Payer: Self-pay | Admitting: Cardiovascular Disease

## 2018-01-01 ENCOUNTER — Ambulatory Visit (INDEPENDENT_AMBULATORY_CARE_PROVIDER_SITE_OTHER): Payer: BLUE CROSS/BLUE SHIELD

## 2018-01-01 DIAGNOSIS — Z23 Encounter for immunization: Secondary | ICD-10-CM

## 2018-01-08 ENCOUNTER — Other Ambulatory Visit: Payer: Self-pay | Admitting: Nurse Practitioner

## 2018-01-08 DIAGNOSIS — F419 Anxiety disorder, unspecified: Secondary | ICD-10-CM

## 2018-01-20 ENCOUNTER — Other Ambulatory Visit: Payer: Self-pay | Admitting: Nurse Practitioner

## 2018-01-20 ENCOUNTER — Other Ambulatory Visit: Payer: Self-pay | Admitting: Cardiovascular Disease

## 2018-01-20 DIAGNOSIS — F419 Anxiety disorder, unspecified: Secondary | ICD-10-CM

## 2018-01-20 NOTE — Telephone Encounter (Signed)
Pt overdue for appointment. Pt last seen 08/2016 needs appointment.

## 2018-01-21 ENCOUNTER — Other Ambulatory Visit: Payer: Self-pay

## 2018-01-21 MED ORDER — LOSARTAN POTASSIUM 25 MG PO TABS: 25.0000 mg | ORAL_TABLET | Freq: Every day | ORAL | 3 refills | Status: DC

## 2018-01-21 NOTE — Telephone Encounter (Signed)
lmov to schedule appt  °

## 2018-01-28 NOTE — Telephone Encounter (Signed)
Attempted to schedule ov .  Call dropped .

## 2018-02-05 NOTE — Telephone Encounter (Signed)
Scheduled

## 2018-02-06 ENCOUNTER — Other Ambulatory Visit: Payer: Self-pay | Admitting: Cardiovascular Disease

## 2018-02-07 ENCOUNTER — Other Ambulatory Visit: Payer: Self-pay | Admitting: *Deleted

## 2018-02-07 MED ORDER — EZETIMIBE 10 MG PO TABS: 10.0000 mg | ORAL_TABLET | Freq: Every day | ORAL | 0 refills | Status: DC

## 2018-02-20 ENCOUNTER — Encounter: Payer: Self-pay | Admitting: Nurse Practitioner

## 2018-02-20 ENCOUNTER — Ambulatory Visit (INDEPENDENT_AMBULATORY_CARE_PROVIDER_SITE_OTHER): Payer: BLUE CROSS/BLUE SHIELD | Admitting: Nurse Practitioner

## 2018-02-20 ENCOUNTER — Other Ambulatory Visit: Payer: Self-pay

## 2018-02-20 VITALS — BP 150/88 | HR 101 | Temp 98.4°F | Resp 16 | Ht 73.0 in | Wt 248.0 lb

## 2018-02-20 DIAGNOSIS — H65192 Other acute nonsuppurative otitis media, left ear: Secondary | ICD-10-CM

## 2018-02-20 DIAGNOSIS — J0100 Acute maxillary sinusitis, unspecified: Secondary | ICD-10-CM | POA: Diagnosis not present

## 2018-02-20 DIAGNOSIS — F419 Anxiety disorder, unspecified: Secondary | ICD-10-CM | POA: Diagnosis not present

## 2018-02-20 MED ORDER — AMOXICILLIN-POT CLAVULANATE 875-125 MG PO TABS: 1.0000 | ORAL_TABLET | Freq: Two times a day (BID) | ORAL | 0 refills | Status: AC

## 2018-02-20 MED ORDER — HYDROXYZINE HCL 25 MG PO TABS: 12.5000 mg | ORAL_TABLET | Freq: Three times a day (TID) | ORAL | 0 refills | Status: DC | PRN

## 2018-02-20 NOTE — Progress Notes (Signed)
Subjective:    Patient ID: Robert Greer, male    DOB: 1955-07-31, 63 y.o.   MRN: [id number]  Robert Greer is a 63 y.o. male presenting on 02/20/2018 for Cough (more in his head, cough up clear mucus, chills, onset 3weeks but worse in 1-2 days)  HPI URI symptoms Initial onset 3 weeks ago with intermittent worsening/improvement, but had more significant worsening over last 1-2 days. - Last night, started having sore throat, increasing cough/ congestion - Sinus,nasal congestion, post nasal drip - Impacted sleep last night.    - No reported fevers.  Mild chills and sweats at times.   - Sick contacts with chicken farmhand, son, grandson  Social History   Tobacco Use  . Smoking status: Never Smoker  . Smokeless tobacco: Current User    Types: Snuff  Substance Use Topics  . Alcohol use: Yes    Alcohol/week: 2.0 - 3.0 standard drinks    Types: 2 - 3 Cans of beer per week    Comment: ocassionally  . Drug use: No    Review of Systems Per HPI unless specifically indicated above     Objective:    BP (!) 150/88   Pulse (!) 101   Temp 98.4 F (36.9 C) (Oral)   Resp 16   Ht 6\' 1"  (1.854 m)   Wt 248 lb (112.5 kg)   SpO2 97%   BMI 32.72 kg/m   Wt Readings from Last 3 Encounters:  02/20/18 248 lb (112.5 kg)  09/16/17 239 lb 6.4 oz (108.6 kg)  08/19/17 237 lb 4.8 oz (107.6 kg)    Physical Exam Vitals signs reviewed.  Constitutional:      Appearance: He is well-developed.  HENT:     Head: Normocephalic and atraumatic.     Right Ear: Hearing, tympanic membrane, ear canal and external ear normal.     Left Ear: Hearing, ear canal and external ear normal. Tympanic membrane is erythematous.     Nose: Mucosal edema and rhinorrhea present.     Right Sinus: Maxillary sinus tenderness and frontal sinus tenderness present.     Left Sinus: Maxillary sinus tenderness and frontal sinus tenderness present.     Mouth/Throat:     Lips: Pink.     Mouth: Mucous membranes are moist.   Pharynx: Uvula midline. No pharyngeal swelling, oropharyngeal exudate, posterior oropharyngeal erythema or uvula swelling.     Tonsils: No tonsillar exudate. Swelling: 1+ on the right. 1+ on the left.  Eyes:     General: Lids are normal.        Right eye: No discharge.        Left eye: No discharge.     Conjunctiva/sclera: Conjunctivae normal.     Pupils: Pupils are equal, round, and reactive to light.  Neck:     Musculoskeletal: Full passive range of motion without pain, normal range of motion and neck supple.  Cardiovascular:     Rate and Rhythm: Normal rate and regular rhythm.     Pulses: Normal pulses.     Heart sounds: Normal heart sounds, S1 normal and S2 normal.  Pulmonary:     Effort: Pulmonary effort is normal. No respiratory distress.     Breath sounds: Normal breath sounds.  Musculoskeletal:     Right lower leg: No edema.     Left lower leg: No edema.  Lymphadenopathy:     Cervical: Cervical adenopathy present.     Right cervical: Superficial cervical adenopathy present.  Left cervical: Superficial cervical adenopathy present.  Skin:    General: Skin is warm and dry.     Capillary Refill: Capillary refill takes less than 2 seconds.  Neurological:     General: No focal deficit present.     Mental Status: He is alert.     GCS: GCS eye subscore is 4. GCS verbal subscore is 5. GCS motor subscore is 6.  Psychiatric:        Attention and Perception: Attention normal.        Mood and Affect: Mood normal.        Behavior: Behavior normal. Behavior is cooperative.      Results for orders placed or performed in visit on 09/16/17  Hepatitis A Ab, Total  Result Value Ref Range   Hep A Total Ab Negative Negative  Hepatitis C Antibody  Result Value Ref Range   Hep C Virus Ab 0.4 0.0 - 0.9 s/co ratio  HIV antibody (with reflex)  Result Value Ref Range   HIV Screen 4th Generation wRfx Non Reactive Non Reactive  Hepatic function panel  Result Value Ref Range   Total  Protein 7.3 6.0 - 8.5 g/dL   Albumin 4.6 3.6 - 4.8 g/dL   Bilirubin Total 0.3 0.0 - 1.2 mg/dL   Bilirubin, Direct 0.10 0.00 - 0.40 mg/dL   Alkaline Phosphatase 67 39 - 117 IU/L   AST 29 0 - 40 IU/L   ALT 25 0 - 44 IU/L  Protime-INR  Result Value Ref Range   INR CANCELED       Assessment & Plan:   Problem List Items Addressed This Visit      Respiratory   Sinus infection - Primary   Relevant Medications   amoxicillin-clavulanate (AUGMENTIN) 875-125 MG tablet     Other   Anxiety   Relevant Medications   hydrOXYzine (ATARAX/VISTARIL) 25 MG tablet    Other Visit Diagnoses    Other non-recurrent acute nonsuppurative otitis media of left ear       Relevant Medications   amoxicillin-clavulanate (AUGMENTIN) 875-125 MG tablet    Consistent with URI and secondary sinusitis/left AOM with symptoms worsening over the past 7 days and initial symptoms of nasal congestion and sinus pressure over 2 weeks ago.   Plan: 1.START taking Augmentin 875-125 mg tablets every 12 hours for 10 days.  Discussed completing antibiotic. - While on antibiotic, take a probiotic OTC or from food. - Continue anti-histamine loratadine 10mg  daily. - Can use Flonase 2 sprays each nostril daily for up to 4-6 weeks if no epistaxis. - Start Mucinex-DM OTC for  7-10 days prn congestion 2. Supportive care with nasal saline, warm herbal tea with honey, 3. Improve hydration 4. Tylenol / Motrin PRN fevers  5. Return criteria given    Meds ordered this encounter  Medications  . amoxicillin-clavulanate (AUGMENTIN) 875-125 MG tablet    Sig: Take 1 tablet by mouth 2 (two) times daily for 10 days.    Dispense:  20 tablet    Refill:  0    Order Specific Question:   Supervising Provider    Answer:   Olin Hauser [[id number]]  . hydrOXYzine (ATARAX/VISTARIL) 25 MG tablet    Sig: Take 0.5-1 tablets (12.5-25 mg total) by mouth 3 (three) times daily as needed for anxiety. NEED APPT    Dispense:  30 tablet     Refill:  0    Order Specific Question:   Supervising Provider    Answer:  Nobie Putnam J [[id number]]    Follow up plan: Return if symptoms worsen or fail to improve.  Cassell Smiles, DNP, AGPCNP-BC Adult Gerontology Primary Care Nurse Practitioner [facility] 02/20/2018, 3:14 PM

## 2018-02-20 NOTE — Patient Instructions (Addendum)
Robert Greer,   Thank you for coming in to clinic today.  1. It sounds like you have a Bacterial sinus and left ear infection.  Recommend good hand washing. - START Augmentin 875-125 mg one tablet every 12 hours for 10 days.  Make sure to take all doses of your antibiotic. - While you are on an antibiotic, take a probiotic.  Antibiotics kill good and bad bacteria.  A probiotic helps to replace your good bacteria. Probiotic pills can be found over the counter.  One brand is Florastor, but you can use any brand you prefer.  You can also get good bacteria from foods.  These foods are yogurt, kefir, kombucha, and fresh, refrigerated and uncooked sauerkraut. - Drink plenty of fluids.  - Start anti-histamine loratadine or cetirizine 10mg  daily. - You may also use Flonase 2 sprays each nostril daily for up to 4-6 weeks  AVOID over the counter medicines with decongestants.  Other over the counter medications you may try, if needed for symptoms are: - If congestion is worse, start OTC Mucinex (or may try Mucinex-DM for cough) up to 7-10 days then stop - You may try over the counter Nasal Saline spray (Simply Saline, Ocean Spray) as needed to reduce congestion. - Start taking Tylenol extra strength 1 to 2 tablets every 6-8 hours for aches or fever/chills for next few days as needed.  Do not take more than 3,000 mg in 24 hours from all medicines.   - Drink warm herbal tea with honey for sore throat.   If symptoms are significantly worse with persistent fevers/chills despite tylenol/ibpurofen, nausea, vomiting unable to tolerate food/fluids or medicine, body aches, or shortness of breath, sinus pain pressure or worsening productive cough, then follow-up for re-evaluation, may seek more immediate care at Urgent Care or the ED if you are more concerned that it is an emergency.  Please schedule a follow-up appointment with Cassell Smiles, AGNP. Return if symptoms worsen or fail to improve.  If you have any  other questions or concerns, please feel free to call the clinic or send a message through [place]. You may also schedule an earlier appointment if necessary.  You will receive a survey after today's visit either digitally by e-mail or paper by [employer]. Your experiences and feedback matter to [place].  Please respond so we know how we are doing as we provide care for you.   Cassell Smiles, DNP, AGNP-BC Adult Gerontology Nurse Practitioner [place]

## 2018-02-21 ENCOUNTER — Encounter: Payer: Self-pay | Admitting: Nurse Practitioner

## 2018-02-25 ENCOUNTER — Telehealth: Payer: Self-pay | Admitting: Nurse Practitioner

## 2018-02-25 NOTE — Telephone Encounter (Signed)
Pt said he has been having sinus headaches and asked what he should take [phone]

## 2018-02-25 NOTE — Telephone Encounter (Signed)
Continue Zyrtec. START fluticasone (flonase or nasocort) 1 spray in each nostril daily. - May start mucinex prn OTC for congestion/pressure

## 2018-02-26 NOTE — Telephone Encounter (Signed)
The pt was notified. No questions or concerns. 

## 2018-03-07 ENCOUNTER — Other Ambulatory Visit: Payer: Self-pay | Admitting: Cardiovascular Disease

## 2018-03-11 ENCOUNTER — Ambulatory Visit (INDEPENDENT_AMBULATORY_CARE_PROVIDER_SITE_OTHER): Payer: Medicare Other | Admitting: Nurse Practitioner

## 2018-03-11 ENCOUNTER — Encounter: Payer: Self-pay | Admitting: Nurse Practitioner

## 2018-03-11 ENCOUNTER — Other Ambulatory Visit: Payer: Self-pay

## 2018-03-11 VITALS — BP 115/66 | HR 92 | Temp 98.1°F | Ht 73.0 in | Wt 252.4 lb

## 2018-03-11 DIAGNOSIS — M10071 Idiopathic gout, right ankle and foot: Secondary | ICD-10-CM | POA: Diagnosis not present

## 2018-03-11 DIAGNOSIS — M159 Polyosteoarthritis, unspecified: Secondary | ICD-10-CM

## 2018-03-11 DIAGNOSIS — I2510 Atherosclerotic heart disease of native coronary artery without angina pectoris: Secondary | ICD-10-CM | POA: Diagnosis not present

## 2018-03-11 DIAGNOSIS — M8949 Other hypertrophic osteoarthropathy, multiple sites: Secondary | ICD-10-CM

## 2018-03-11 DIAGNOSIS — E782 Mixed hyperlipidemia: Secondary | ICD-10-CM

## 2018-03-11 DIAGNOSIS — K219 Gastro-esophageal reflux disease without esophagitis: Secondary | ICD-10-CM | POA: Diagnosis not present

## 2018-03-11 DIAGNOSIS — M150 Primary generalized (osteo)arthritis: Secondary | ICD-10-CM

## 2018-03-11 DIAGNOSIS — R739 Hyperglycemia, unspecified: Secondary | ICD-10-CM | POA: Diagnosis not present

## 2018-03-11 DIAGNOSIS — I25118 Atherosclerotic heart disease of native coronary artery with other forms of angina pectoris: Secondary | ICD-10-CM

## 2018-03-11 DIAGNOSIS — F419 Anxiety disorder, unspecified: Secondary | ICD-10-CM | POA: Diagnosis not present

## 2018-03-11 MED ORDER — ALLOPURINOL 100 MG PO TABS: 100.0000 mg | ORAL_TABLET | Freq: Every day | ORAL | 1 refills | Status: DC

## 2018-03-11 MED ORDER — DULOXETINE HCL 60 MG PO CPEP: 60.0000 mg | ORAL_CAPSULE | Freq: Two times a day (BID) | ORAL | 1 refills | Status: DC

## 2018-03-11 MED ORDER — HYDROXYZINE HCL 25 MG PO TABS: 12.5000 mg | ORAL_TABLET | Freq: Three times a day (TID) | ORAL | 0 refills | Status: DC | PRN

## 2018-03-11 MED ORDER — COLCHICINE 0.6 MG PO TABS: ORAL_TABLET | ORAL | 1 refills | Status: DC

## 2018-03-11 MED ORDER — DICLOFENAC SODIUM 50 MG PO TBEC: 50.0000 mg | DELAYED_RELEASE_TABLET | Freq: Two times a day (BID) | ORAL | 5 refills | Status: DC | PRN

## 2018-03-11 NOTE — Progress Notes (Signed)
Subjective:    Patient ID: Robert Greer, male    DOB: 1955/03/15, 63 y.o.   MRN: [id number]  Robert Greer is a 63 y.o. male presenting on 03/11/2018 for Anxiety   HPI Anxiety Patient notes good improvement with addition of medications.  - Patient takes hydroxyzine every morning with Cymbalta with good relief.    Arthritis Patient notes no significant improvement in arthritis pain with Cymbalta, but no worsening. Patient continues taking meloxicam regularly.   [name] in [place] at [facility].  Will be retiring.  Patient has not yet transferred care for pain.  Plan to go to [facility] pain management.  Gout Patient has had no flares recently. Continues tolerating  Depression screen [facility] 2/9 03/11/2018 02/20/2018 02/20/2018 07/16/2017 07/02/2017  Decreased Interest 0 1 1 1  0  Down, Depressed, Hopeless 0 0 0 1 0  PHQ - 2 Score 0 1 1 2  0  Altered sleeping 0 0 - 0 0  Tired, decreased energy 0 0 - 2 0  Change in appetite 0 0 - 1 0  Feeling bad or failure about yourself  0 0 - 2 0  Trouble concentrating 0 0 - 1 0  Moving slowly or fidgety/restless 0 0 - 0 0  Suicidal thoughts 0 0 - 0 0  PHQ-9 Score 0 1 - 8 0  Difficult doing work/chores Not difficult at all Not difficult at all - Somewhat difficult Not difficult at all    GAD 7 : Generalized Anxiety Score 03/11/2018 02/20/2018 07/16/2017  Nervous, Anxious, on Edge 0 1 2  Control/stop worrying 0 1 3  Worry too much - different things 0 1 3  Trouble relaxing 0 0 2  Restless 0 0 2  Easily annoyed or irritable 0 1 2  Afraid - awful might happen 0 0 0  Total GAD 7 Score 0 4 14  Anxiety Difficulty Not difficult at all Not difficult at all Somewhat difficult    Social History   Tobacco Use  . Smoking status: Never Smoker  . Smokeless tobacco: Current User    Types: Snuff  Substance Use Topics  . Alcohol use: Yes    Alcohol/week: 2.0 - 3.0 standard drinks    Types: 2 - 3 Cans of beer per week    Comment: ocassionally  . Drug use: No     Review of Systems Per HPI unless specifically indicated above     Objective:    BP 115/66 (BP Location: Left Arm, Patient Position: Sitting, Cuff Size: Large)   Pulse 92   Temp 98.1 F (36.7 C) (Oral)   Ht 6\' 1"  (1.854 m)   Wt 252 lb 6.4 oz (114.5 kg)   BMI 33.30 kg/m   Wt Readings from Last 3 Encounters:  03/11/18 252 lb 6.4 oz (114.5 kg)  02/20/18 248 lb (112.5 kg)  09/16/17 239 lb 6.4 oz (108.6 kg)    Physical Exam Vitals signs reviewed.  Constitutional:      General: He is awake. He is not in acute distress.    Appearance: He is well-developed.  HENT:     Head: Normocephalic and atraumatic.  Neck:     Musculoskeletal: Normal range of motion and neck supple.     Vascular: No carotid bruit.  Cardiovascular:     Rate and Rhythm: Normal rate and regular rhythm.     Pulses:          Radial pulses are 2+ on the right side and  2+ on the left side.       Posterior tibial pulses are 1+ on the right side and 1+ on the left side.     Heart sounds: Normal heart sounds, S1 normal and S2 normal.  Pulmonary:     Effort: Pulmonary effort is normal. No respiratory distress.     Breath sounds: Normal breath sounds and air entry.  Abdominal:     General: Bowel sounds are normal. There is no distension.     Palpations: Abdomen is soft. There is no hepatomegaly or splenomegaly.     Tenderness: There is no abdominal tenderness.     Hernia: No hernia is present.  Skin:    General: Skin is warm and dry.     Capillary Refill: Capillary refill takes less than 2 seconds.  Neurological:     General: No focal deficit present.     Mental Status: He is alert and oriented to person, place, and time. Mental status is at baseline.  Psychiatric:        Attention and Perception: Attention normal.        Mood and Affect: Mood and affect normal.        Behavior: Behavior normal. Behavior is cooperative.        Thought Content: Thought content normal.        Judgment: Judgment normal.     Results for orders placed or performed in visit on 09/16/17  Hepatitis A Ab, Total  Result Value Ref Range   Hep A Total Ab Negative Negative  Hepatitis C Antibody  Result Value Ref Range   Hep C Virus Ab 0.4 0.0 - 0.9 s/co ratio  HIV antibody (with reflex)  Result Value Ref Range   HIV Screen 4th Generation wRfx Non Reactive Non Reactive  Hepatic function panel  Result Value Ref Range   Total Protein 7.3 6.0 - 8.5 g/dL   Albumin 4.6 3.6 - 4.8 g/dL   Bilirubin Total 0.3 0.0 - 1.2 mg/dL   Bilirubin, Direct 0.10 0.00 - 0.40 mg/dL   Alkaline Phosphatase 67 39 - 117 IU/L   AST 29 0 - 40 IU/L   ALT 25 0 - 44 IU/L  Protime-INR  Result Value Ref Range   INR CANCELED       Assessment & Plan:   Problem List Items Addressed This Visit      Cardiovascular and Mediastinum   ASCVD (arteriosclerotic cardiovascular disease) - Primary   Relevant Orders   COMPLETE METABOLIC PANEL WITH GFR   Lipid panel   Coronary artery disease of native artery of native heart with stable angina pectoris (HCC)     Digestive   GERD (gastroesophageal reflux disease)     Other   Mixed hyperlipidemia   Anxiety   Relevant Medications   hydrOXYzine (ATARAX/VISTARIL) 25 MG tablet   DULoxetine (CYMBALTA) 60 MG capsule   Elevated blood sugar   Gout   Relevant Medications   colchicine 0.6 MG tablet   allopurinol (ZYLOPRIM) 100 MG tablet   Other Relevant Orders   COMPLETE METABOLIC PANEL WITH GFR   Uric acid    Other Visit Diagnoses    Primary osteoarthritis involving multiple joints       Relevant Medications   colchicine 0.6 MG tablet   allopurinol (ZYLOPRIM) 100 MG tablet   diclofenac (VOLTAREN) 50 MG EC tablet   Other Relevant Orders   COMPLETE METABOLIC PANEL WITH GFR     # ASCVD, CAD, mixed hyperlipidemia Previously controlled hyperlipidemia.  Currently taking atorvastatin 20 mg once daily. No new ASCVD events since last visit.  Patient denies any regular anginal symptoms. - Personal  history of prior ASCVD events STEMI in 12/2014 -  Family history of ASCVD events/atherosclerosis.  Plan: 1. Continue atorvastatin 20 mg once daily. Consider dose change after labs. 2. Discussed and reinforced prior education for heart healthy, low glycemic diet 3. Discussed increasing exercise to 30 minutes most days of the week. 4. Follow-up 6 months.     # Anxiety Currently stable.  Patient continues having situational stressors, but feels he can tolerate them much better since starting duloxetine at last visit about 6 weeks ago.   - Can stop hydroxyzine unless needed for overwhelming anxiety. Patient taking daily - Continue duloxetine 60 mg once daily. - Follow-up 6 months   # Arthritis Chronic and stable arthritis multiple joints.  No current severe activity limitations.    Plan: 1. Continue diclofenac 75 mg one tab twice daily prn for multiple arthralgias.  Skip doses when able to protect stomach. 2. Can start using diclofenac gel prn in future. 3. Continue duloxetine for anxiety and arthritis pain. 4. Continue follow-up with orthopedics prn.   # Gout Well controlled currently.  Patient without any flares since last visit and no known chronic gouty arthritis.  Check control with uric acid labs.  Continue allopurinol daily, colchicine prn. Refills sent today.  Follow-up 1 year and prn.  Meds ordered this encounter  Medications  . hydrOXYzine (ATARAX/VISTARIL) 25 MG tablet    Sig: Take 0.5-1 tablets (12.5-25 mg total) by mouth 3 (three) times daily as needed for anxiety. NEED APPT    Dispense:  30 tablet    Refill:  0    Order Specific Question:   Supervising Provider    Answer:   Olin Hauser [[id number]]  . DULoxetine (CYMBALTA) 60 MG capsule    Sig: Take 1 capsule (60 mg total) by mouth 2 (two) times daily.    Dispense:  180 capsule    Refill:  1    Order Specific Question:   Supervising Provider    Answer:   Olin Hauser [[id number]]  . colchicine 0.6 MG  tablet    Sig: Take 2 tabs (1.2 mg) once dose at start of gout flare.  Then take 1 tab (0.6 mg) every 12 hours until end of gout flare or max of 14 days.    Dispense:  30 tablet    Refill:  1    May choose brand based on insurance preference.    Order Specific Question:   Supervising Provider    Answer:   Olin Hauser [[id number]]  . allopurinol (ZYLOPRIM) 100 MG tablet    Sig: Take 1 tablet (100 mg total) by mouth daily.    Dispense:  90 tablet    Refill:  1    Order Specific Question:   Supervising Provider    Answer:   Olin Hauser [[id number]]  . diclofenac (VOLTAREN) 50 MG EC tablet    Sig: Take 1 tablet (50 mg total) by mouth 2 (two) times daily as needed for moderate pain (arthritis).    Dispense:  60 tablet    Refill:  5    Order Specific Question:   Supervising Provider    Answer:   Olin Hauser [[id number]]    Follow up plan: Return in about 6 months (around 09/09/2018) for anxiety, arthritis AND Welcome to Medicare visit at pt convenience.  Cassell Smiles, DNP,  AGPCNP-BC Adult Gerontology Primary Care Nurse Practitioner [facility] 03/11/2018, 4:15 PM

## 2018-03-11 NOTE — Patient Instructions (Addendum)
Robert Greer,   Thank you for coming in to clinic today.  1. Use hydroxyzine only as needed.  2. Continue Cymbalta.  3. STOP meloxicam  4. START diclofenac 50 mg one tablet twice daily as needed instead.  This does not last 24 hours, so it should be better for your stomach.  Skip this when able to skip this to protect your stomach.  5. You will be due for FASTING BLOOD WORK.  This means you should eat no food or drink after midnight.  Drink only water or coffee without cream/sugar on the morning of your lab visit. - Please go ahead and schedule a "Lab Only" visit in the morning at the clinic for lab draw in the next 7 days. - Your results will be available about 2-3 days after blood draw.  If you have set up a MyChart account, you can can log in to MyChart online to view your results and a brief explanation. Also, we can discuss your results together at your next office visit if you would like.  Please schedule a follow-up appointment with Cassell Smiles, AGNP. Return in about 6 months (around 09/09/2018) for anxiety, arthritis AND Welcome to Medicare visit at pt convenience in next 3 months.  If you have any other questions or concerns, please feel free to call the clinic or send a message through [place]. You may also schedule an earlier appointment if necessary.  You will receive a survey after today's visit either digitally by e-mail or paper by [employer]. Your experiences and feedback matter to [place].  Please respond so we know how we are doing as we provide care for you.   Cassell Smiles, DNP, AGNP-BC Adult Gerontology Nurse Practitioner [place]

## 2018-03-14 ENCOUNTER — Encounter: Payer: Self-pay | Admitting: Nurse Practitioner

## 2018-03-14 ENCOUNTER — Other Ambulatory Visit: Payer: Self-pay | Admitting: Nurse Practitioner

## 2018-03-14 DIAGNOSIS — J0100 Acute maxillary sinusitis, unspecified: Secondary | ICD-10-CM

## 2018-03-14 MED ORDER — LEVOFLOXACIN 500 MG PO TABS: 500.0000 mg | ORAL_TABLET | Freq: Every day | ORAL | 0 refills | Status: DC

## 2018-03-14 NOTE — Progress Notes (Signed)
Call placed to RN call center this morning at 8:55 am.  Patient without resolution of sinusitis from 02/20/2018.  Send levofloxacin 500 mg x 7 days to pharmacy.  Patient called and informed.

## 2018-03-20 ENCOUNTER — Telehealth: Payer: Self-pay | Admitting: Nurse Practitioner

## 2018-03-20 DIAGNOSIS — J0100 Acute maxillary sinusitis, unspecified: Secondary | ICD-10-CM

## 2018-03-20 MED ORDER — LEVOFLOXACIN 500 MG PO TABS: 500.0000 mg | ORAL_TABLET | Freq: Every day | ORAL | 0 refills | Status: DC

## 2018-03-20 NOTE — Telephone Encounter (Signed)
Pt want to talk about getting more antibiotics [phone]

## 2018-03-20 NOTE — Telephone Encounter (Signed)
Pt requesting another round of Levaquin. He states he's notice improvement with his symptoms, but his symptoms have not resolved. He complains of head congestion and just not feeling completely well. Please advise

## 2018-03-20 NOTE — Telephone Encounter (Signed)
This is max for levaquin.  If symptoms continue after this, he will need to see ENT.

## 2018-03-20 NOTE — Telephone Encounter (Signed)
The pt was notified. He verbalize understanding, no questions or concerns. 

## 2018-03-24 ENCOUNTER — Ambulatory Visit (INDEPENDENT_AMBULATORY_CARE_PROVIDER_SITE_OTHER): Payer: Medicare Other | Admitting: Nurse Practitioner

## 2018-03-24 ENCOUNTER — Encounter: Payer: Self-pay | Admitting: Nurse Practitioner

## 2018-03-24 VITALS — BP 128/77 | HR 96 | Temp 97.4°F | Ht 73.0 in | Wt 255.4 lb

## 2018-03-24 DIAGNOSIS — J3089 Other allergic rhinitis: Secondary | ICD-10-CM | POA: Diagnosis not present

## 2018-03-24 DIAGNOSIS — L270 Generalized skin eruption due to drugs and medicaments taken internally: Secondary | ICD-10-CM | POA: Diagnosis not present

## 2018-03-24 MED ORDER — TRIAMCINOLONE ACETONIDE 0.1 % EX CREA: 1.0000 "application " | TOPICAL_CREAM | Freq: Two times a day (BID) | CUTANEOUS | 0 refills | Status: DC

## 2018-03-24 MED ORDER — TRIAMCINOLONE ACETONIDE 55 MCG/ACT NA AERO: 2.0000 | INHALATION_SPRAY | Freq: Every day | NASAL | 12 refills | Status: DC

## 2018-03-24 NOTE — Patient Instructions (Addendum)
Robert Greer,   Thank you for coming in to clinic today.  1. START triamcinolone 7.7% one application twice daily for 7-10 days.  Please schedule a follow-up appointment with Cassell Smiles, AGNP. Return if symptoms worsen or fail to improve.  If you have any other questions or concerns, please feel free to call the clinic or send a message through [place]. You may also schedule an earlier appointment if necessary.  You will receive a survey after today's visit either digitally by e-mail or paper by [employer]. Your experiences and feedback matter to [place].  Please respond so we know how we are doing as we provide care for you.  Cassell Smiles, DNP, AGNP-BC Adult Gerontology Nurse Practitioner [place]

## 2018-03-24 NOTE — Progress Notes (Signed)
Subjective:    Patient ID: Robert Greer, male    DOB: December 14, 1955, 63 y.o.   MRN: [id number]  Robert Greer is a 63 y.o. male presenting on 03/24/2018 for Rash (on the back x 3-5 days w/ severe itching. The pt seems to think the rash is coming from levaquin. )   HPI New rash back Last dose levaquin was Saturday morning.   - Rash first appeared about 4-5 days ago with itching after finishing first doses of Levaquin.  Patient has not started extension of doses, but notes his URI is improving and he does not want to take the rest that was added.  He notes his wife has helped scratch it and he reports she has caused some excoriation.  Nasal Congestion Patient reports continued nasal congestion, but less sinus pressure and no ongoing infection symptoms.  Patient would like to have better control of congestion.  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  Social History   Tobacco Use  . Smoking status: Never Smoker  . Smokeless tobacco: Current User    Types: Snuff  Substance Use Topics  . Alcohol use: Yes    Alcohol/week: 2.0 - 3.0 standard drinks    Types: 2 - 3 Cans of beer per week    Comment: ocassionally  . Drug use: No    Review of Systems Per HPI unless specifically indicated above     Objective:    BP 128/77 (BP Location: Right Arm, Patient Position: Sitting, Cuff Size: Large)   Pulse 96   Temp (!) 97.4 F (36.3 C) (Oral)   Ht 6\' 1"  (1.854 m)   Wt 255 lb 6.4 oz (115.8 kg)   BMI 33.70 kg/m   Wt Readings from Last 3 Encounters:  03/24/18 255 lb 6.4 oz (115.8 kg)  03/11/18 252 lb 6.4 oz (114.5 kg)  02/20/18 248 lb (112.5 kg)    Physical Exam Vitals signs reviewed.  Constitutional:      General: He is not in acute distress.    Appearance: He is well-developed.  HENT:     Head: Normocephalic and atraumatic.  Cardiovascular:     Rate and Rhythm: Normal rate and regular rhythm.     Pulses:          Radial pulses are 2+ on the right side and 2+  on the left side.       Posterior tibial pulses are 1+ on the right side and 1+ on the left side.     Heart sounds: Normal heart sounds, S1 normal and S2 normal.  Pulmonary:     Effort: Pulmonary effort is normal. No respiratory distress.     Breath sounds: Normal breath sounds and air entry.  Musculoskeletal:     Right lower leg: No edema.     Left lower leg: No edema.  Skin:    General: Skin is warm and dry.     Capillary Refill: Capillary refill takes less than 2 seconds.     Findings: Rash (macular, erythematious rash - pruritic and with excoriation.  No evidence of periwound edema, erythema or drainage.) present.  Neurological:     Mental Status: He is alert and oriented to person, place, and time.  Psychiatric:        Attention and Perception: Attention normal.        Mood and Affect: Mood and affect normal.        Behavior: Behavior normal. Behavior is cooperative.  Results for orders placed or performed in visit on 09/16/17  Hepatitis A Ab, Total  Result Value Ref Range   Hep A Total Ab Negative Negative  Hepatitis C Antibody  Result Value Ref Range   Hep C Virus Ab 0.4 0.0 - 0.9 s/co ratio  HIV antibody (with reflex)  Result Value Ref Range   HIV Screen 4th Generation wRfx Non Reactive Non Reactive  Hepatic function panel  Result Value Ref Range   Total Protein 7.3 6.0 - 8.5 g/dL   Albumin 4.6 3.6 - 4.8 g/dL   Bilirubin Total 0.3 0.0 - 1.2 mg/dL   Bilirubin, Direct 0.10 0.00 - 0.40 mg/dL   Alkaline Phosphatase 67 39 - 117 IU/L   AST 29 0 - 40 IU/L   ALT 25 0 - 44 IU/L  Protime-INR  Result Value Ref Range   INR CANCELED       Assessment & Plan:   Problem List Items Addressed This Visit    None    Visit Diagnoses    Drug-induced skin rash    -  Primary   Relevant Medications   triamcinolone cream (KENALOG) 0.1 %   Non-seasonal allergic rhinitis due to other allergic trigger       Relevant Medications   triamcinolone (NASACORT) 55 MCG/ACT AERO nasal  inhaler    Rash - drug induced Acute rash likely drug-induced, mild severity.  No evidence of Steven's Johnson syndrome, cellulitis, or any other complications.  Offending agent has been stopped. No involvement of airway. - Continue off of levaquin.  Rash is mild in severity, so drug may be considered in future but should only be used if absolutely necessary. - START triamcinolone cream bid to affected areas as needed up to 14 days max.  - Follow-up prn.  Nasal congestion - allergic rhinitis Poorly managed and continues congestion.   Patient has only started taking OTC antihistamine.  Is not using any intranasal steroid. - START Nasacort once daily - Continue OTC antihistamine - Consider future ENT referral (Past patient of Dr. Beverly Gust). - Follow-up prn.  Meds ordered this encounter  Medications  . triamcinolone cream (KENALOG) 0.1 %    Sig: Apply 1 application topically 2 (two) times daily.    Dispense:  30 g    Refill:  0    Order Specific Question:   Supervising Provider    Answer:   Olin Hauser [[id number]]  . triamcinolone (NASACORT) 55 MCG/ACT AERO nasal inhaler    Sig: Place 2 sprays into the nose daily.    Dispense:  1 Inhaler    Refill:  12    Order Specific Question:   Supervising Provider    Answer:   Olin Hauser [[id number]]    Follow up plan: Return if symptoms worsen or fail to improve.  Cassell Smiles, DNP, AGPCNP-BC Adult Gerontology Primary Care Nurse Practitioner [facility] 03/24/2018, 11:50 AM

## 2018-03-31 ENCOUNTER — Encounter: Payer: Self-pay | Admitting: Nurse Practitioner

## 2018-04-01 ENCOUNTER — Ambulatory Visit: Payer: BLUE CROSS/BLUE SHIELD | Admitting: Cardiovascular Disease

## 2018-04-07 ENCOUNTER — Other Ambulatory Visit: Payer: Self-pay | Admitting: Gastroenterology

## 2018-04-07 DIAGNOSIS — K921 Melena: Secondary | ICD-10-CM

## 2018-04-09 ENCOUNTER — Telehealth: Payer: Self-pay | Admitting: Cardiovascular Disease

## 2018-04-09 NOTE — Telephone Encounter (Signed)
Left voicemail message for patient to call back.

## 2018-04-09 NOTE — Telephone Encounter (Signed)
   Cardiac Questionnaire:    Since your last visit or hospitalization:    1. Have you been having new or worsening chest pain? No   2. Have you been having new or worsening shortness of breath? No 3. Have you been having new or worsening leg swelling, wt gain, or increase in abdominal girth (pants fitting more tightly)? No   4. Have you had any passing out spells? No    *A YES to any of these questions would result in the appointment being kept. *If all the answers to these questions are NO, we should indicate that given the current situation regarding the worldwide coronarvirus pandemic, at the recommendation of the CDC, we are looking to limit gatherings in our waiting area, and thus will reschedule their appointment beyond four weeks from today.   _____________   COVID-19 Pre-Screening Questions:  . Have you recently travelled abroad or to [place], [place], or [place]? No (4) . Do you currently have a fever? No (4) . Have you been in contact with someone that is currently pending confirmation of Covid19 testing or has been confirmed to have the Covid19 virus?  No (4) . Are you currently experiencing fatigue or cough? No (2) . Are you currently experiencing new or worsening shortness of breath at rest or with minimal activity? No (1) . Have you been in contact with someone that was recently sick with fever/cough/fatigue? No (1)   **A score of 4 or more should result in cancellation of the pts cardiology appt.  **A score of  2 should be provided a mask prior to admission into the lobby  *Travel to a high risk area or contact with a confirmed case should stay at home,  away from confirmed patient, monitor symptoms, and reach out to PCP for e-visit,  additional testing.  *ALL PTS W/ FEVER SHOULD BE REFERRED TO PCP FOR E-VISIT*         

## 2018-04-11 ENCOUNTER — Ambulatory Visit: Payer: Self-pay | Admitting: Cardiovascular Disease

## 2018-04-21 NOTE — Telephone Encounter (Signed)
lmov to schedule evisit

## 2018-04-24 ENCOUNTER — Telehealth: Payer: Self-pay | Admitting: Cardiovascular Disease

## 2018-04-24 NOTE — Telephone Encounter (Signed)
Scheduled 4/3 with Dr Rockey Situ

## 2018-04-24 NOTE — Telephone Encounter (Signed)
Spoke with patient. He is doing well and would like an E-visit. He prefers phone visit as he states he has limited technology. Will route to scheduling pool and take out of COVID19 pool.

## 2018-04-24 NOTE — Telephone Encounter (Signed)
Virtual Visit Pre-Appointment Phone Call  Steps For Call:  1. Confirm consent - "In the setting of the current Covid19 crisis, you are scheduled for a (phone or video) visit with your provider on (date) at (time).  Just as we do with many in-office visits, in order for you to participate in this visit, we must obtain consent.  If you'd like, I can send this to your mychart (if signed up) or email for you to review.  Otherwise, I can obtain your verbal consent now.  All virtual visits are billed to your insurance company just like a normal visit would be.  By agreeing to a virtual visit, we'd like you to understand that the technology does not allow for your provider to perform an examination, and thus may limit your provider's ability to fully assess your condition.  Finally, though the technology is pretty good, we cannot assure that it will always work on either your or our end, and in the setting of a video visit, we may have to convert it to a phone-only visit.  In either situation, we cannot ensure that we have a secure connection.  Are you willing to proceed?"  2. Give patient instructions for WebEx download to smartphone as below if video visit  3. Advise patient to be prepared with any vital sign or heart rhythm information, their current medicines, and a piece of paper and pen handy for any instructions they may receive the [place] of their visit  4. Inform patient they will receive a phone call 15 minutes prior to their appointment time (may be from unknown caller ID) so they should be prepared to answer  5. Confirm that appointment type is correct in Epic appointment notes (video vs telephone)    TELEPHONE CALL NOTE  Robert Greer has been deemed a candidate for a follow-up tele-health visit to limit community exposure during the Covid-19 pandemic. I spoke with the patient via phone to ensure availability of phone/video source, confirm preferred email & phone number, and discuss  instructions and expectations.  I reminded Robert Greer to be prepared with any vital sign and/or heart rhythm information that could potentially be obtained via home monitoring, at the time of his visit. I reminded Robert Greer to expect a phone call at the time of his visit if his visit.  Did the patient verbally acknowledge consent to treatment? Yes  [name] 04/24/2018 11:32 AM   DOWNLOADING THE WEBEX SOFTWARE TO SMARTPHONE  - If Apple, go to [employer] and type in WebEx in the search bar. [place] [employer], the blue/green circle. The app is free but as with any other app downloads, their phone may require them to verify saved payment information or Apple password. The patient does NOT have to create an account.  - If Android, ask patient to go to [employer] and type in WebEx in the search bar. [place] [employer], the blue/green circle. The app is free but as with any other app downloads, their phone may require them to verify saved payment information or Android password. The patient does NOT have to create an account.   CONSENT FOR TELE-HEALTH VISIT - PLEASE REVIEW  I hereby voluntarily request, consent and authorize [facility] and its employed or contracted physicians, physician assistants, nurse practitioners or other licensed health care professionals (the Practitioner), to provide me with telemedicine health care services (the Services") as deemed necessary by the treating Practitioner. I acknowledge  and consent to receive the Services by the Practitioner via telemedicine. I understand that the telemedicine visit will involve communicating with the Practitioner through live audiovisual communication technology and the disclosure of certain medical information by electronic transmission. I acknowledge that I have been given the opportunity to request an in-person assessment or other available alternative prior to the telemedicine visit and am  voluntarily participating in the telemedicine visit.  I understand that I have the right to withhold or withdraw my consent to the use of telemedicine in the course of my care at any time, without affecting my right to future care or treatment, and that the Practitioner or I may terminate the telemedicine visit at any time. I understand that I have the right to inspect all information obtained and/or recorded in the course of the telemedicine visit and may receive copies of available information for a reasonable fee.  I understand that some of the potential risks of receiving the Services via telemedicine include:   Delay or interruption in medical evaluation due to technological equipment failure or disruption;  Information transmitted may not be sufficient (e.g. poor resolution of images) to allow for appropriate medical decision making by the Practitioner; and/or   In rare instances, security protocols could fail, causing a breach of personal health information.  Furthermore, I acknowledge that it is my responsibility to provide information about my medical history, conditions and care that is complete and accurate to the best of my ability. I acknowledge that Practitioner's advice, recommendations, and/or decision may be based on factors not within their control, such as incomplete or inaccurate data provided by me or distortions of diagnostic images or specimens that may result from electronic transmissions. I understand that the practice of medicine is not an exact science and that Practitioner makes no warranties or guarantees regarding treatment outcomes. I acknowledge that I will receive a copy of this consent concurrently upon execution via email to the email address I last provided but may also request a printed copy by calling the office of [facility].    I understand that my insurance will be billed for this visit.   I have read or had this consent read to me.  I understand the  contents of this consent, which adequately explains the benefits and risks of the Services being provided via telemedicine.   I have been provided ample opportunity to ask questions regarding this consent and the Services and have had my questions answered to my satisfaction.  I give my informed consent for the services to be provided through the use of telemedicine in my medical care  By participating in this telemedicine visit I agree to the above.

## 2018-04-25 ENCOUNTER — Telehealth (INDEPENDENT_AMBULATORY_CARE_PROVIDER_SITE_OTHER): Payer: Medicare Other | Admitting: Cardiovascular Disease

## 2018-04-25 ENCOUNTER — Other Ambulatory Visit: Payer: Self-pay

## 2018-04-25 DIAGNOSIS — I10 Essential (primary) hypertension: Secondary | ICD-10-CM

## 2018-04-25 DIAGNOSIS — G8929 Other chronic pain: Secondary | ICD-10-CM

## 2018-04-25 DIAGNOSIS — M545 Low back pain, unspecified: Secondary | ICD-10-CM

## 2018-04-25 DIAGNOSIS — E782 Mixed hyperlipidemia: Secondary | ICD-10-CM | POA: Diagnosis not present

## 2018-04-25 DIAGNOSIS — I25118 Atherosclerotic heart disease of native coronary artery with other forms of angina pectoris: Secondary | ICD-10-CM

## 2018-04-25 MED ORDER — ATORVASTATIN CALCIUM 20 MG PO TABS: 20.0000 mg | ORAL_TABLET | Freq: Every day | ORAL | 2 refills | Status: DC

## 2018-04-25 MED ORDER — CLOPIDOGREL BISULFATE 75 MG PO TABS: 75.0000 mg | ORAL_TABLET | Freq: Every day | ORAL | 2 refills | Status: DC

## 2018-04-25 MED ORDER — LOSARTAN POTASSIUM 25 MG PO TABS: 25.0000 mg | ORAL_TABLET | Freq: Every day | ORAL | 2 refills | Status: DC

## 2018-04-25 MED ORDER — EZETIMIBE 10 MG PO TABS: 10.0000 mg | ORAL_TABLET | Freq: Every day | ORAL | 2 refills | Status: DC

## 2018-04-25 NOTE — Patient Instructions (Addendum)
Medication Instructions:  We will refill your meds, #90, with refills  If you need a refill on your cardiac medications before your next appointment, please call your pharmacy.    Lab work: No new labs needed   If you have labs (blood work) drawn today and your tests are completely normal, you will receive your results only by: Marland Kitchen MyChart Message (if you have MyChart) OR . A paper copy in the mail If you have any lab test that is abnormal or we need to change your treatment, we will call you to review the results.   Testing/Procedures: No new testing needed   Follow-Up: At [facility], you and your health needs are our priority.  As part of our continuing mission to provide you with exceptional heart care, we have created designated Provider Care Teams.  These Care Teams include your primary Cardiologist (physician) and Advanced Practice Providers (APPs -  Physician Assistants and Nurse Practitioners) who all work together to provide you with the care you need, when you need it.  . You will need a follow up appointment in 6 months .   Please call our office 2 months in advance to schedule this appointment.    . Providers on your designated Care Team:   . Murray Hodgkins, NP . Christell Faith, PA-C . Marrianne Mood, PA-C  Any Other Special Instructions Will Be Listed Below (If Applicable).  For educational health videos Log in to : [email or web address] Or : [email or web address], password : [email or web address]

## 2018-04-25 NOTE — Progress Notes (Addendum)
Virtual Visit via Telephone Note   This visit type was conducted due to national recommendations for restrictions regarding the COVID-19 Pandemic (e.g. social distancing) in an effort to limit this patient's exposure and mitigate transmission in our community.  Due to her co-morbid illnesses, this patient is at least at moderate risk for complications without adequate follow up.  This format is felt to be most appropriate for this patient at this time.  The patient did not have access to video technology/had technical difficulties with video requiring transitioning to audio format only (telephone).  All issues noted in this document were discussed and addressed.  No physical exam could be performed with this format.  Please refer to the patient's chart for her  consent to telehealth for [facility].    Date:  04/25/2018   ID:  Robert Greer, DOB 01/05/1956, MRN [id number]  Patient Location:  [place] [address]   Provider location:   [place], [place] office  PCP:  Mikey College, NP  Cardiologist:  Patsy Baltimore  Chief Complaint:  Back pain   History of Present Illness:    Robert Greer is a 63 y.o. male who presents via audio/video conferencing for a telehealth visit today.   The patient does not symptoms concerning for COVID-19 infection (fever, chills, cough, or new SHORTNESS OF BREATH).   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 Carloyn Manner for back pain, stenosis Feels that he will need another surgery at some point  Medications reviewed with him in detail on Lipitor 20 mg daily,  total chol 156, LDL 57 in 05/2017 lipitor and zetia, no side effects  Nonsmoker Bp ok, previous office numbers [id number] systolic, with primary care  No chest pain concerning for angina Denies having shortness of breath on exertion Limited by orthopedic issues to some degree   Prior CV studies:   The following studies were reviewed today:  Echocardiogram report was not completed, ejection fraction unavailable but estimated at normal, 60% with no wall motion abnormality on stress test  Cath 2016  Mid RCA lesion, 100% stenosed.  Dist Cx lesion, 60% stenosed.  Ost 1st Diag to 1st Diag lesion, 50% stenosed.  Prox LAD to Mid LAD lesion, 85% stenosed. Post intervention, there is a 5% residual stenosis.  The left ventricular systolic function is normal.   1. Significant three-vessel coronary artery disease.  Occluded mid right coronary artery is likely the culprit for myocardial infarction.  However, there are readily right to right collaterals and left-to-right collaterals. There is a 60% distal left circumflex stenosis and an 85% proximal to mid heavily calcified LAD disease.  2. Low normal LV systolic function with mildly elevated left ventricular end-diastolic pressure.  3. Successful angioplasty and 3 overlapped drug-eluting stent placement to the proximal to mid LAD. Sluggish flow in the jailed first diagonal which improved with nitroglycerin.  It appears that the RCA occlusion is more than 65 days old and already has some faint collaterals. Thus, no PCI  was performed. The distal vessel also appears to be diffusely diseased and does not seem to be a good target for intervention. I decided to intervene in the proximal to mid LAD which was heavily calcified. This was a difficult procedure. The distal left circumflex disease can  be left to be treated medically. Continue indefinite dual antiplatelet therapy if possible. Aggressive treatment of risk factors.   Past Medical History:  Diagnosis Date  . Anxiety   . Arthritis   . Backache, unspecified   . Depressive disorder, not elsewhere classified   . Gout   . Hyperlipidemia   . Hypertension   . Lipoma of back 02/09/2015  . Measles   . Mobitz (type) II atrioventricular block   . Mumps   . Other hemochromatosis 12/05/2016  . Sleep apnea    Past Surgical History:  Procedure Laterality Date  . ANTERIOR FUSION LUMBAR SPINE    . BACK SURGERY  2012   x2  . CARDIAC CATHETERIZATION N/A 01/10/2015   Procedure: Left Heart Cath and Coronary Angiography;  Surgeon: Wellington Hampshire, MD;  Location: [place] [facility];  Service: Cardiovascular;  Laterality: N/A;  . CARDIAC CATHETERIZATION N/A 01/10/2015   Procedure: Coronary Stent Intervention;  Surgeon: Wellington Hampshire, MD;  Location: [place] [facility];  Service: Cardiovascular;  Laterality: N/A;  . COLONOSCOPY  2005  . COLONOSCOPY WITH PROPOFOL N/A 02/14/2017   Procedure: COLONOSCOPY WITH PROPOFOL;  Surgeon: [name], MD;  Location: [facility] ENDOSCOPY;  Service: Gastroenterology;  Laterality: N/A;  . ESOPHAGOGASTRODUODENOSCOPY (EGD) WITH PROPOFOL N/A 02/14/2017   Procedure: ESOPHAGOGASTRODUODENOSCOPY (EGD) WITH PROPOFOL;  Surgeon: [name], MD;  Location: [facility] ENDOSCOPY;  Service: Gastroenterology;  Laterality: N/A;  . KNEE SURGERY     bilateral  . TONSILLECTOMY AND ADENOIDECTOMY    . TOTAL SHOULDER REPLACEMENT Right      No outpatient medications have been marked as taking for the 04/25/18 encounter (Appointment) with Minna Merritts, MD.     Allergies:   Levaquin [levofloxacin]   Social History   Tobacco Use  . Smoking status: Never Smoker  . Smokeless tobacco: Current User    Types: Snuff  Substance Use Topics  . Alcohol use: Yes    Alcohol/week: 2.0 - 3.0 standard drinks    Types: 2 - 3 Cans of  beer per week    Comment: ocassionally  . Drug use: No     Current Outpatient Medications on File Prior to Visit  Medication Sig Dispense Refill  . allopurinol (ZYLOPRIM) 100 MG tablet Take 1 tablet (100 mg total) by mouth daily. 90 tablet 1  . aspirin 81 MG chewable tablet Chew 1 tablet (81 mg total) by mouth daily. 30 tablet 3  . atorvastatin (LIPITOR) 20 MG tablet Take 1 tablet (20 mg total) by mouth daily. 90 tablet 0  . cetirizine (ZYRTEC) 10 MG tablet Take 10 mg by mouth as needed for allergies. Reported on 02/09/2015    . clopidogrel (PLAVIX) 75 MG tablet Take 1 tablet (75 mg total) by mouth daily. 90 tablet 0  . colchicine 0.6 MG tablet Take 2 tabs (1.2 mg) once dose at start of gout flare.  Then take 1 tab (0.6 mg) every 12 hours until end of gout flare or max of 14 days. 30 tablet 1  . diclofenac (VOLTAREN) 50 MG EC tablet Take 1 tablet (50 mg total) by mouth 2 (two) times daily as needed for moderate pain (arthritis). 60 tablet 5  . DULoxetine (CYMBALTA) 60  MG capsule Take 1 capsule (60 mg total) by mouth 2 (two) times daily. 180 capsule 1  . esomeprazole (NEXIUM) 40 MG capsule     . ezetimibe (ZETIA) 10 MG tablet Take 1 tablet (10 mg total) by mouth daily. 30 tablet 0  . hydrOXYzine (ATARAX/VISTARIL) 25 MG tablet Take 0.5-1 tablets (12.5-25 mg total) by mouth 3 (three) times daily as needed for anxiety. NEED APPT 30 tablet 0  . losartan (COZAAR) 25 MG tablet Take 1 tablet (25 mg total) by mouth daily. 30 tablet 3  . omeprazole (PRILOSEC) 40 MG capsule Take 1 capsule (40 mg total) by mouth daily. 90 capsule 3  . oxyCODONE-acetaminophen (PERCOCET) 10-325 MG tablet Take 1 tablet by mouth daily as needed for pain.     Marland Kitchen tiZANidine (ZANAFLEX) 4 MG capsule Take 4 mg by mouth 3 (three) times daily as needed.    . triamcinolone (NASACORT) 55 MCG/ACT AERO nasal inhaler Place 2 sprays into the nose daily. 1 Inhaler 12  . triamcinolone cream (KENALOG) 0.1 % Apply 1 application topically 2  (two) times daily. 30 g 0   No current facility-administered medications on file prior to visit.      Family Hx: The patient's family history includes Diabetes in his sister; Liver disease in his brother and father; Lung cancer in his paternal grandmother and sister; Stroke in his mother.  ROS:   Please see the history of present illness.    Review of Systems  Constitutional: Negative.   Respiratory: Negative.   Cardiovascular: Negative.   Gastrointestinal: Negative.   Musculoskeletal: Positive for back pain.  Neurological: Negative.   Psychiatric/Behavioral: Negative.   All other systems reviewed and are negative.    Labs/Other Tests and Data Reviewed:    Recent Labs: 08/19/2017: Hemoglobin 15.4; Platelets 286; TSH 3.310 09/16/2017: ALT 25   Recent Lipid Panel Lab Results  Component Value Date/Time   CHOL 149 01/08/2015 02:25 AM   TRIG 114 01/08/2015 02:25 AM   HDL 48 01/08/2015 02:25 AM   CHOLHDL 3.1 01/08/2015 02:25 AM   LDLCALC 78 01/08/2015 02:25 AM    Wt Readings from Last 3 Encounters:  03/24/18 255 lb 6.4 oz (115.8 kg)  03/11/18 252 lb 6.4 oz (114.5 kg)  02/20/18 248 lb (112.5 kg)     Exam:    Vital Signs: Vital signs as detailed above in HPI  Well nourished, well developed male in no acute distress. Constitutional:  oriented to person, place, and time. No distress.    ASSESSMENT & PLAN:    Coronary artery disease of native artery of native heart with stable angina pectoris (HCC) Currently with no symptoms of angina. No further workup at this time. Continue current medication regimen.  We will have him come in for EKG at a later date after virus outbreak  Hypertension, unspecified type Blood pressure is well controlled on today's visit. No changes made to the medications.  Mixed hyperlipidemia Cholesterol is at goal on the current lipid regimen. No changes to the medications were made.  Tinea Lipitor Zetia  Chronic low back pain, unspecified back  pain laterality, unspecified whether sciatica present Followed by neurosurgery Has had 2 prior back surgeries, still with chronic pain   COVID-19 Education: The signs and symptoms of COVID-19 were discussed with the patient and how to seek care for testing (follow up with PCP or arrange E-visit).  The importance of social distancing was discussed today.  Patient Risk:   After full review of this patients  clinical status, I feel that they are at least moderate risk at this time.  Time:   Today, I have spent 25 minutes with the patient with telehealth technology discussing anginal symptoms to watch for, cholesterol, management of blood pressure, chronic back pain.     Medication Adjustments/Labs and Tests Ordered: Current medicines are reviewed at length with the patient today.  Concerns regarding medicines are outlined above.   Tests Ordered: No tests ordered   Medication Changes: No changes made   Disposition: Follow-up in 6 months   Signed, Ida Rogue, MD  04/25/2018 1:49 PM    [place] [facility] [address] [place] [address], [place], [place] [address]

## 2018-05-15 ENCOUNTER — Other Ambulatory Visit: Payer: Self-pay | Admitting: Nurse Practitioner

## 2018-05-15 DIAGNOSIS — F419 Anxiety disorder, unspecified: Secondary | ICD-10-CM

## 2018-06-03 ENCOUNTER — Telehealth: Payer: Self-pay | Admitting: *Deleted

## 2018-06-03 NOTE — Telephone Encounter (Signed)
Patient called requesting for Dr Tasia Catchings or her nurse to return his call. I see form past notes that he has been noncompliant with phlebotomy appointments and has not been seen since July 2019, he also has no follow up appointment with [place]. He can be reached at [phone]

## 2018-06-03 NOTE — Telephone Encounter (Signed)
Please give him a call and see how can we help him.  If he wants to,please schedule follow up appt with me lab cbc cmp and MD next week.

## 2018-06-04 ENCOUNTER — Other Ambulatory Visit: Payer: Self-pay

## 2018-06-04 NOTE — Telephone Encounter (Signed)
Spoke with patient.  Scheduled lab appt for tomorrow and virtual visit on Friday.

## 2018-06-05 ENCOUNTER — Telehealth: Payer: Self-pay | Admitting: Oncology

## 2018-06-05 ENCOUNTER — Inpatient Hospital Stay: Payer: Medicare Other | Attending: Oncology

## 2018-06-05 ENCOUNTER — Other Ambulatory Visit: Payer: Self-pay

## 2018-06-05 DIAGNOSIS — Z7982 Long term (current) use of aspirin: Secondary | ICD-10-CM | POA: Diagnosis not present

## 2018-06-05 DIAGNOSIS — G4730 Sleep apnea, unspecified: Secondary | ICD-10-CM | POA: Diagnosis not present

## 2018-06-05 DIAGNOSIS — F419 Anxiety disorder, unspecified: Secondary | ICD-10-CM | POA: Insufficient documentation

## 2018-06-05 DIAGNOSIS — F329 Major depressive disorder, single episode, unspecified: Secondary | ICD-10-CM | POA: Insufficient documentation

## 2018-06-05 DIAGNOSIS — I2510 Atherosclerotic heart disease of native coronary artery without angina pectoris: Secondary | ICD-10-CM | POA: Insufficient documentation

## 2018-06-05 DIAGNOSIS — R5383 Other fatigue: Secondary | ICD-10-CM

## 2018-06-05 DIAGNOSIS — M1990 Unspecified osteoarthritis, unspecified site: Secondary | ICD-10-CM | POA: Insufficient documentation

## 2018-06-05 DIAGNOSIS — E785 Hyperlipidemia, unspecified: Secondary | ICD-10-CM | POA: Diagnosis not present

## 2018-06-05 DIAGNOSIS — Z791 Long term (current) use of non-steroidal anti-inflammatories (NSAID): Secondary | ICD-10-CM | POA: Diagnosis not present

## 2018-06-05 DIAGNOSIS — Z955 Presence of coronary angioplasty implant and graft: Secondary | ICD-10-CM | POA: Insufficient documentation

## 2018-06-05 DIAGNOSIS — I10 Essential (primary) hypertension: Secondary | ICD-10-CM | POA: Insufficient documentation

## 2018-06-05 DIAGNOSIS — M109 Gout, unspecified: Secondary | ICD-10-CM | POA: Diagnosis not present

## 2018-06-05 DIAGNOSIS — Z79899 Other long term (current) drug therapy: Secondary | ICD-10-CM | POA: Diagnosis not present

## 2018-06-05 LAB — IRON AND TIBC
Iron: 86 ug/dL (ref 45–182)
Saturation Ratios: 28 % (ref 17.9–39.5)
TIBC: 306 ug/dL (ref 250–450)
UIBC: 221 ug/dL

## 2018-06-05 LAB — COMPREHENSIVE METABOLIC PANEL
ALT: 43 U/L (ref 0–44)
AST: 31 U/L (ref 15–41)
Albumin: 4.3 g/dL (ref 3.5–5.0)
Alkaline Phosphatase: 63 U/L (ref 38–126)
Anion gap: 7 (ref 5–15)
BUN: 19 mg/dL (ref 8–23)
CO2: 25 mmol/L (ref 22–32)
Calcium: 9 mg/dL (ref 8.9–10.3)
Chloride: 107 mmol/L (ref 98–111)
Creatinine, Ser: 1.05 mg/dL (ref 0.61–1.24)
GFR calc Af Amer: >60 mL/min (ref >60)
GFR calc non Af Amer: >60 mL/min (ref >60)
Glucose, Bld: 89 mg/dL (ref 70–99)
Potassium: 4.4 mmol/L (ref 3.5–5.1)
Sodium: 139 mmol/L (ref 135–145)
Total Bilirubin: 0.4 mg/dL (ref 0.3–1.2)
Total Protein: 7.7 g/dL (ref 6.5–8.1)

## 2018-06-05 LAB — CBC WITH DIFFERENTIAL/PLATELET
Abs Immature Granulocytes: 0.02 10*3/uL (ref 0.00–0.07)
Basophils Absolute: 0.1 10*3/uL (ref 0.0–0.1)
Basophils Relative: 1 %
Eosinophils Absolute: 0.3 10*3/uL (ref 0.0–0.5)
Eosinophils Relative: 4 %
HCT: 43.1 % (ref 39.0–52.0)
Hemoglobin: 14.6 g/dL (ref 13.0–17.0)
Immature Granulocytes: 0 %
Lymphocytes Relative: 25 %
Lymphs Abs: 2 10*3/uL (ref 0.7–4.0)
MCH: 31.8 pg (ref 26.0–34.0)
MCHC: 33.9 g/dL (ref 30.0–36.0)
MCV: 93.9 fL (ref 80.0–100.0)
Monocytes Absolute: 0.7 10*3/uL (ref 0.1–1.0)
Monocytes Relative: 9 %
Neutro Abs: 4.9 10*3/uL (ref 1.7–7.7)
Neutrophils Relative %: 61 %
Platelets: 284 10*3/uL (ref 150–400)
RBC: 4.59 MIL/uL (ref 4.22–5.81)
RDW: 12.2 % (ref 11.5–15.5)
WBC: 7.9 10*3/uL (ref 4.0–10.5)
nRBC: 0 % (ref 0.0–0.2)

## 2018-06-05 LAB — FERRITIN: Ferritin: 193 ng/mL (ref 24–336)

## 2018-06-06 ENCOUNTER — Inpatient Hospital Stay (HOSPITAL_BASED_OUTPATIENT_CLINIC_OR_DEPARTMENT_OTHER): Payer: Medicare Other | Admitting: Oncology

## 2018-06-06 ENCOUNTER — Encounter: Payer: Self-pay | Admitting: Oncology

## 2018-06-06 DIAGNOSIS — R5383 Other fatigue: Secondary | ICD-10-CM | POA: Diagnosis not present

## 2018-06-06 DIAGNOSIS — M2550 Pain in unspecified joint: Secondary | ICD-10-CM | POA: Diagnosis not present

## 2018-06-06 NOTE — Progress Notes (Signed)
HEMATOLOGY-ONCOLOGY TeleHEALTH VISIT PROGRESS NOTE  I connected with Robert Greer on 06/06/18 at  9:15 AM EDT by video enabled telemedicine visit and verified that I am speaking with the correct person using two identifiers. I discussed the limitations, risks, security and privacy concerns of performing an evaluation and management service by telemedicine and the availability of in-person appointments. I also discussed with the patient that there may be a patient responsible charge related to this service. The patient expressed understanding and agreed to proceed.   Other persons participating in the visit and their role in the encounter:  Janeann Merl, RN, check in patient.   Patient's location: Home  Provider's location: Home office Chief Complaint: Management of hemochromatosis and iron overload.   INTERVAL HISTORY Robert Greer is a 63 y.o. male who has above history reviewed by me today presents for follow up visit for management of hemochromatosis and iron overload Problems and complaints are listed below:  Patient was last seen approximately 1 year ago, after that he lost follow-up. Patient has a history of compound heterozygous hemochromatosis mutation C2 82Y/H63D. Patient reports feeling very fatigued and tired.  Multiple joint pain,no exacerbating factors.    he recalls symptoms are getting better when he was on phlebotomy program. Patient has coronary artery disease following up with Dr. Rockey Situ.  Was recently seen on 04/25/2018. He currently has no symptoms of angina. He occasionally drinks beer. Appetite is fair.  Reports weight has been stable.  Review of Systems  Constitutional: Positive for fatigue. Negative for appetite change, chills, fever and unexpected weight change.  HENT:   Negative for hearing loss and voice change.   Eyes: Negative for eye problems and icterus.  Respiratory: Negative for chest tightness, cough and shortness of breath.   Cardiovascular: Negative for  chest pain and leg swelling.  Gastrointestinal: Negative for abdominal distention and abdominal pain.  Endocrine: Negative for hot flashes.  Genitourinary: Negative for difficulty urinating, dysuria and frequency.   Musculoskeletal: Positive for arthralgias.  Skin: Negative for itching and rash.  Neurological: Negative for light-headedness and numbness.  Hematological: Negative for adenopathy. Does not bruise/bleed easily.  Psychiatric/Behavioral: Negative for confusion.    Past Medical History:  Diagnosis Date  . Anxiety   . Arthritis   . Backache, unspecified   . Depressive disorder, not elsewhere classified   . Gout   . Hyperlipidemia   . Hypertension   . Lipoma of back 02/09/2015  . Measles   . Mobitz (type) II atrioventricular block   . Mumps   . Other hemochromatosis 12/05/2016  . Sleep apnea    Past Surgical History:  Procedure Laterality Date  . ANTERIOR FUSION LUMBAR SPINE    . BACK SURGERY  2012   x2  . CARDIAC CATHETERIZATION N/A 01/10/2015   Procedure: Left Heart Cath and Coronary Angiography;  Surgeon: Wellington Hampshire, MD;  Location: [place] [facility];  Service: Cardiovascular;  Laterality: N/A;  . CARDIAC CATHETERIZATION N/A 01/10/2015   Procedure: Coronary Stent Intervention;  Surgeon: Wellington Hampshire, MD;  Location: [place] [facility];  Service: Cardiovascular;  Laterality: N/A;  . COLONOSCOPY  2005  . COLONOSCOPY WITH PROPOFOL N/A 02/14/2017   Procedure: COLONOSCOPY WITH PROPOFOL;  Surgeon: [name], MD;  Location: [facility];  Service: Gastroenterology;  Laterality: N/A;  . ESOPHAGOGASTRODUODENOSCOPY (EGD) WITH PROPOFOL N/A 02/14/2017   Procedure: ESOPHAGOGASTRODUODENOSCOPY (EGD) WITH PROPOFOL;  Surgeon: [name], MD;  Location: [facility] ENDOSCOPY;  Service: Gastroenterology;  Laterality: N/A;  .  KNEE SURGERY     bilateral  . TONSILLECTOMY AND ADENOIDECTOMY    . TOTAL SHOULDER REPLACEMENT Right     Family History  Problem Relation Age of Onset   . Stroke Mother   . Liver disease Father   . Lung cancer Sister   . Diabetes Sister   . Liver disease Brother   . Lung cancer Paternal Grandmother     Social History   Socioeconomic History  . Marital status: Married    Spouse name: Not on file  . Number of children: Not on file  . Years of education: Not on file  . Highest education level: Not on file  Occupational History  . Not on file  Social Needs  . Financial resource strain: Not on file  . Food insecurity:    Worry: Not on file    Inability: Not on file  . Transportation needs:    Medical: Not on file    Non-medical: Not on file  Tobacco Use  . Smoking status: Never Smoker  . Smokeless tobacco: Current User    Types: Snuff  Substance and Sexual Activity  . Alcohol use: Yes    Alcohol/week: 2.0 - 3.0 standard drinks    Types: 2 - 3 Cans of beer per week    Comment: ocassionally  . Drug use: No  . Sexual activity: Yes  Lifestyle  . Physical activity:    Days per week: Not on file    Minutes per session: Not on file  . Stress: Not on file  Relationships  . Social connections:    Talks on phone: Not on file    Gets together: Not on file    Attends religious service: Not on file    Active member of club or organization: Not on file    Attends meetings of clubs or organizations: Not on file    Relationship status: Not on file  . Intimate partner violence:    Fear of current or ex partner: Not on file    Emotionally abused: Not on file    Physically abused: Not on file    Forced sexual activity: Not on file  Other Topics Concern  . Not on file  Social History Narrative  . Not on file    Current Outpatient Medications on File Prior to Visit  Medication Sig Dispense Refill  . allopurinol (ZYLOPRIM) 100 MG tablet Take 1 tablet (100 mg total) by mouth daily. 90 tablet 1  . aspirin 81 MG chewable tablet Chew 1 tablet (81 mg total) by mouth daily. 30 tablet 3  . atorvastatin (LIPITOR) 20 MG tablet Take 1  tablet (20 mg total) by mouth daily. 90 tablet 2  . cetirizine (ZYRTEC) 10 MG tablet Take 10 mg by mouth as needed for allergies. Reported on 02/09/2015    . clopidogrel (PLAVIX) 75 MG tablet Take 1 tablet (75 mg total) by mouth daily. 90 tablet 2  . colchicine 0.6 MG tablet Take 2 tabs (1.2 mg) once dose at start of gout flare.  Then take 1 tab (0.6 mg) every 12 hours until end of gout flare or max of 14 days. 30 tablet 1  . diclofenac (VOLTAREN) 50 MG EC tablet Take 1 tablet (50 mg total) by mouth 2 (two) times daily as needed for moderate pain (arthritis). 60 tablet 5  . DULoxetine (CYMBALTA) 60 MG capsule Take 1 capsule (60 mg total) by mouth 2 (two) times daily. 180 capsule 1  . esomeprazole (NEXIUM) 40  MG capsule     . ezetimibe (ZETIA) 10 MG tablet Take 1 tablet (10 mg total) by mouth daily. 90 tablet 2  . hydrOXYzine (ATARAX/VISTARIL) 25 MG tablet Take 0.5-1 tablets (12.5-25 mg total) by mouth 3 (three) times daily as needed for anxiety. 30 tablet 0  . losartan (COZAAR) 25 MG tablet Take 1 tablet (25 mg total) by mouth daily. 90 tablet 2  . omeprazole (PRILOSEC) 40 MG capsule Take 1 capsule (40 mg total) by mouth daily. 90 capsule 3  . oxyCODONE-acetaminophen (PERCOCET) 10-325 MG tablet Take 1 tablet by mouth daily as needed for pain.     Marland Kitchen tiZANidine (ZANAFLEX) 4 MG capsule Take 4 mg by mouth 3 (three) times daily as needed.    . triamcinolone (NASACORT) 55 MCG/ACT AERO nasal inhaler Place 2 sprays into the nose daily. 1 Inhaler 12  . triamcinolone cream (KENALOG) 0.1 % Apply 1 application topically 2 (two) times daily. 30 g 0   No current facility-administered medications on file prior to visit.     Allergies  Allergen Reactions  . Levaquin [Levofloxacin] Rash       Observations/Objective: There were no vitals filed for this visit. There is no height or weight on file to calculate BMI.  Physical Exam  Constitutional: He is oriented to person, place, and time. No distress.  HENT:   Head: Normocephalic.  Pulmonary/Chest: Effort normal.  Neurological: He is alert and oriented to person, place, and time.    CBC    Component Value Date/Time   WBC 7.9 06/05/2018 1506   RBC 4.59 06/05/2018 1506   HGB 14.6 06/05/2018 1506   HCT 43.1 06/05/2018 1506   PLT 284 06/05/2018 1506   MCV 93.9 06/05/2018 1506   MCH 31.8 06/05/2018 1506   MCHC 33.9 06/05/2018 1506   RDW 12.2 06/05/2018 1506   LYMPHSABS 2.0 06/05/2018 1506   MONOABS 0.7 06/05/2018 1506   EOSABS 0.3 06/05/2018 1506   BASOSABS 0.1 06/05/2018 1506    CMP     Component Value Date/Time   NA 139 06/05/2018 1506   K 4.4 06/05/2018 1506   CL 107 06/05/2018 1506   CO2 25 06/05/2018 1506   GLUCOSE 89 06/05/2018 1506   BUN 19 06/05/2018 1506   CREATININE 1.05 06/05/2018 1506   CALCIUM 9.0 06/05/2018 1506   PROT 7.7 06/05/2018 1506   PROT 7.3 09/16/2017 1424   ALBUMIN 4.3 06/05/2018 1506   ALBUMIN 4.6 09/16/2017 1424   AST 31 06/05/2018 1506   ALT 43 06/05/2018 1506   ALKPHOS 63 06/05/2018 1506   BILITOT 0.4 06/05/2018 1506   BILITOT 0.3 09/16/2017 1424   GFRNONAA >60 06/05/2018 1506   GFRAA >60 06/05/2018 1506     Assessment and Plan: 1. Hereditary hemochromatosis ([place])   2. Other fatigue   3. Arthralgia, unspecified joint     Labs reviewed and discussed with patient. Iron panel shows ferritin 193, iron saturation 28. Currently he does not have significant iron overload.  However in general for compound heterozygous hemochromatosis patient, recommend to keep ferritin less than 100, preferably less than 50. Patient did clinically feels better after previous phlebotomy.  Recommend phlebotomy next week, repeat phlebotomy in 4 weeks and [redacted] weeks along with checking hemoglobin/hematocrit level.  Hold phlebotomy if hemoglobin less than 13. Would also recommend obtain ultrasound right upper quadrant to further evaluate liver. Discussed with patient about alcohol cessation. Recommend first-degree  relatives to be checked.  Fatigue, check TSh Arthralgia, check ESR, CRP,  ANA, RF.   Follow Up Instructions: lab/ phleb in 4 weeks (H&H) lab/ phleb in 4 weeks (H&H) Lab MD possible phlebotomy in 12 weeks. Orders Placed This Encounter  Procedures  . US Abdomen Limited RUQ    Standing Status:   Future    Standing Expiration Date:   08/06/2019    Order Specific Question:   Reason for Exam (SYMPTOM  OR DIAGNOSIS REQUIRED)    Answer:   hemochromatosis    Order Specific Question:   Preferred imaging location?    Answer:   [facility]  . Hemoglobin and Hematocrit, Blood    Standing Status:   Future    Standing Expiration Date:   06/06/2019  . Hemoglobin and Hematocrit, Blood    Standing Status:   Future    Standing Expiration Date:   06/06/2019  . CBC with Differential/Platelet    Standing Status:   Future    Standing Expiration Date:   06/06/2019  . Iron and TIBC    Standing Status:   Future    Standing Expiration Date:   06/06/2019  . Ferritin    Standing Status:   Future    Standing Expiration Date:   06/06/2019      I discussed the assessment and treatment plan with the patient. The patient was provided an opportunity to ask questions and all were answered. The patient agreed with the plan and demonstrated an understanding of the instructions.  The patient was advised to call back or seek an in-person evaluation if the symptoms worsen or if the condition fails to improve as anticipated.   Earlie Server, MD 06/06/2018 3:37 PM

## 2018-06-06 NOTE — Progress Notes (Signed)
Patient contacted for telehealth visit.

## 2018-06-11 DIAGNOSIS — M545 Low back pain: Secondary | ICD-10-CM | POA: Diagnosis not present

## 2018-06-19 ENCOUNTER — Ambulatory Visit: Payer: Medicare Other | Admitting: Nurse Practitioner

## 2018-06-20 ENCOUNTER — Ambulatory Visit: Payer: PRIVATE HEALTH INSURANCE

## 2018-06-25 ENCOUNTER — Ambulatory Visit
Admission: EM | Admit: 2018-06-25 | Discharge: 2018-06-25 | Disposition: A | Discharge status: Home / Self Care | Payer: Medicare Other | Attending: Family Medicine | Admitting: Family Medicine

## 2018-06-25 ENCOUNTER — Ambulatory Visit: Payer: Medicare Other

## 2018-06-25 ENCOUNTER — Other Ambulatory Visit: Payer: Self-pay

## 2018-06-25 ENCOUNTER — Encounter: Payer: Self-pay | Admitting: Emergency Medicine

## 2018-06-25 DIAGNOSIS — S8991XA Unspecified injury of right lower leg, initial encounter: Secondary | ICD-10-CM | POA: Diagnosis not present

## 2018-06-25 DIAGNOSIS — M79661 Pain in right lower leg: Secondary | ICD-10-CM | POA: Diagnosis not present

## 2018-06-25 DIAGNOSIS — T148XXA Other injury of unspecified body region, initial encounter: Secondary | ICD-10-CM | POA: Diagnosis not present

## 2018-06-25 DIAGNOSIS — L03115 Cellulitis of right lower limb: Secondary | ICD-10-CM | POA: Diagnosis not present

## 2018-06-25 MED ORDER — DOXYCYCLINE HYCLATE 100 MG PO CAPS: 100.0000 mg | ORAL_CAPSULE | Freq: Two times a day (BID) | ORAL | 0 refills | Status: DC

## 2018-06-25 NOTE — ED Provider Notes (Signed)
[facility]    CSN: [id number] Arrival date & time: 06/25/18  1437  History   Chief Complaint Chief Complaint  Patient presents with  . Leg Pain   HPI  63 year old male with an extensive past medical history presents with the above complaint.  Patient states that he injured his right lower leg last Monday.  Patient reports that he slipped and the twos of a bobcat machine injured his leg.  He reports that he has a wound and extensive bruising of his right lower extremity.  The swelling has slightly improved.  He reports mild pain.  He has been able to ambulate.  He has been icing the area with slight improvement.  He does note significant redness.  He is concerned about infection.  No documented fever.  His temperature is elevated currently.  No reports of purulent drainage from the wound.  Pain is worse with activity.  No other associated symptoms.  No other complaints.   PMH, Surgical Hx, Family Hx, Social History reviewed and updated as below.  Past Medical History:  Diagnosis Date  . Anxiety   . Arthritis   . Backache, unspecified   . Depressive disorder, not elsewhere classified   . Gout   . Hyperlipidemia   . Hypertension   . Lipoma of back 02/09/2015  . Measles   . Mobitz (type) II atrioventricular block   . Mumps   . Other hemochromatosis 12/05/2016  . Sleep apnea    Patient Active Problem List   Diagnosis Date Noted  . Gout 06/18/2017  . Hereditary hemochromatosis ([place]) 05/09/2017  . Other hemochromatosis 12/05/2016  . Coronary artery disease of native artery of native heart with stable angina pectoris ([place]) 09/09/2016  . Anxiety 02/09/2015  . Arthritis 02/09/2015  . Clinical depression 02/09/2015  . Elevated blood sugar 02/09/2015  . History of prolonged Q-T interval on ECG 02/09/2015  . Hypercholesteremia 02/09/2015  . Hypertension 02/09/2015  . H/O measles 02/09/2015  . H/O mumps 02/09/2015  . Allergic rhinitis, seasonal 02/09/2015  . Sinus  infection 02/09/2015  . GERD (gastroesophageal reflux disease) 02/09/2015  . ASCVD (arteriosclerotic cardiovascular disease) 02/09/2015  . History of ST elevation myocardial infarction (STEMI)   . Chronic low back pain 06/30/2014  . Squamous cell carcinoma of nose 06/17/2013  . Arrhythmia 08/20/2011  . Mixed hyperlipidemia 08/20/2011    Past Surgical History:  Procedure Laterality Date  . ANTERIOR FUSION LUMBAR SPINE    . BACK SURGERY  2012   x2  . CARDIAC CATHETERIZATION N/A 01/10/2015   Procedure: Left Heart Cath and Coronary Angiography;  Surgeon: Wellington Hampshire, MD;  Location: [place] [facility];  Service: Cardiovascular;  Laterality: N/A;  . CARDIAC CATHETERIZATION N/A 01/10/2015   Procedure: Coronary Stent Intervention;  Surgeon: Wellington Hampshire, MD;  Location: [place] [facility];  Service: Cardiovascular;  Laterality: N/A;  . COLONOSCOPY  2005  . COLONOSCOPY WITH PROPOFOL N/A 02/14/2017   Procedure: COLONOSCOPY WITH PROPOFOL;  Surgeon: [name], MD;  Location: [facility];  Service: Gastroenterology;  Laterality: N/A;  . ESOPHAGOGASTRODUODENOSCOPY (EGD) WITH PROPOFOL N/A 02/14/2017   Procedure: ESOPHAGOGASTRODUODENOSCOPY (EGD) WITH PROPOFOL;  Surgeon: [name], MD;  Location: [facility] ENDOSCOPY;  Service: Gastroenterology;  Laterality: N/A;  . KNEE SURGERY     bilateral  . TONSILLECTOMY AND ADENOIDECTOMY    . TOTAL SHOULDER REPLACEMENT Right        Home Medications    Prior to Admission medications   Medication Sig Start Date End  Date Taking? Authorizing Provider  allopurinol (ZYLOPRIM) 100 MG tablet Take 1 tablet (100 mg total) by mouth daily. 03/11/18  Yes Mikey College, NP  aspirin 81 MG chewable tablet Chew 1 tablet (81 mg total) by mouth daily. 01/11/15  Yes Aldean Jewett, MD  atorvastatin (LIPITOR) 20 MG tablet Take 1 tablet (20 mg total) by mouth daily. 04/25/18  Yes Minna Merritts, MD  cetirizine (ZYRTEC) 10 MG tablet Take 10 mg by mouth  as needed for allergies. Reported on 02/09/2015   Yes [provider]  clopidogrel (PLAVIX) 75 MG tablet Take 1 tablet (75 mg total) by mouth daily. 04/25/18  Yes Minna Merritts, MD  colchicine 0.6 MG tablet Take 2 tabs (1.2 mg) once dose at start of gout flare.  Then take 1 tab (0.6 mg) every 12 hours until end of gout flare or max of 14 days. 03/11/18  Yes Mikey College, NP  diclofenac (VOLTAREN) 50 MG EC tablet Take 1 tablet (50 mg total) by mouth 2 (two) times daily as needed for moderate pain (arthritis). 03/11/18  Yes Mikey College, NP  DULoxetine (CYMBALTA) 60 MG capsule Take 1 capsule (60 mg total) by mouth 2 (two) times daily. 03/11/18  Yes Mikey College, NP  esomeprazole ([place]) 40 MG capsule  07/01/15  Yes [provider]  ezetimibe (ZETIA) 10 MG tablet Take 1 tablet (10 mg total) by mouth daily. 04/25/18  Yes Minna Merritts, MD  hydrOXYzine (ATARAX/VISTARIL) 25 MG tablet Take 0.5-1 tablets (12.5-25 mg total) by mouth 3 (three) times daily as needed for anxiety. 05/15/18  Yes Karamalegos, Devonne Doughty, DO  losartan (COZAAR) 25 MG tablet Take 1 tablet (25 mg total) by mouth daily. 04/25/18 07/24/18 Yes Gollan, Kathlene November, MD  omeprazole (PRILOSEC) 40 MG capsule Take 1 capsule (40 mg total) by mouth daily. 03/18/17  Yes [name], MD  oxyCODONE-acetaminophen (PERCOCET) 10-325 MG tablet Take 1 tablet by mouth daily as needed for pain.    Yes [provider]  tiZANidine (ZANAFLEX) 4 MG capsule Take 4 mg by mouth 3 (three) times daily as needed. 08/16/17  Yes [provider]  triamcinolone (NASACORT) 55 MCG/ACT AERO nasal inhaler Place 2 sprays into the nose daily. 03/24/18  Yes Mikey College, NP  triamcinolone cream (KENALOG) 0.1 % Apply 1 application topically 2 (two) times daily. 03/24/18  Yes Mikey College, NP  doxycycline (VIBRAMYCIN) 100 MG capsule Take 1 capsule (100 mg total) by mouth 2 (two) times daily. 06/25/18   Coral Spikes, DO    Family History Family History  Problem Relation Age of Onset  . Stroke Mother   . Liver disease Father   . Lung cancer Sister   . Diabetes Sister   . Liver disease Brother   . Lung cancer Paternal Grandmother     Social History Social History   Tobacco Use  . Smoking status: Never Smoker  . Smokeless tobacco: Current User    Types: Snuff  Substance Use Topics  . Alcohol use: Yes    Alcohol/week: 2.0 - 3.0 standard drinks    Types: 2 - 3 Cans of beer per week    Comment: ocassionally  . Drug use: No     Allergies   Levaquin [levofloxacin]   Review of Systems Review of Systems  Constitutional: Negative.   Musculoskeletal:       Leg injury - swelling, bruising, pain.   Physical Exam Triage Vital Signs ED Triage  Vitals  Enc Vitals Group     BP 06/25/18 1458 138/62     Pulse Rate 06/25/18 1458 97     Resp 06/25/18 1458 18     Temp 06/25/18 1501 99.8 F (37.7 C)     Temp Source 06/25/18 1501 Oral     SpO2 06/25/18 1458 99 %     Weight 06/25/18 1500 245 lb (111.1 kg)     Height 06/25/18 1500 6' (1.829 m)     Head Circumference --      Peak Flow --      Pain Score 06/25/18 1458 3     Pain Loc --      Pain Edu? --      Excl. in [place]? --    Updated Vital Signs BP 138/62 (BP Location: Right Arm)   Pulse 97   Temp 99.8 F (37.7 C) (Oral)   Resp 18   Ht 6' (1.829 m)   Wt 111.1 kg   SpO2 99%   BMI 33.23 kg/m   Visual Acuity Right Eye Distance:   Left Eye Distance:   Bilateral Distance:    Right Eye Near:   Left Eye Near:    Bilateral Near:     Physical Exam Vitals signs and nursing note reviewed.  Constitutional:      General: He is not in acute distress.    Appearance: Normal appearance.  HENT:     Head: Normocephalic and atraumatic.  Eyes:     General:        Right eye: No discharge.        Left eye: No discharge.     Conjunctiva/sclera: Conjunctivae normal.  Pulmonary:     Effort: Pulmonary effort is normal. No respiratory  distress.  Musculoskeletal:     Comments: Right lower leg with an extensive amount of bruising.  Associated diffuse swelling.  Patient has a notable area of more prominent swelling and surrounding erythema on the medial aspect of his calf near the tibia.   Neurological:     Mental Status: He is alert.  Psychiatric:        Mood and Affect: Mood normal.        Behavior: Behavior normal.    UC Treatments / Results  Labs (all labs ordered are listed, but only abnormal results are displayed) Labs Reviewed - No data to display  EKG None  Radiology Dg Tibia/fibula Right  Result Date: 06/25/2018 CLINICAL DATA:  Right leg pain after injury. EXAM: RIGHT TIBIA AND FIBULA - 2 VIEW COMPARISON:  None. FINDINGS: There is no evidence of fracture or other focal bone lesions. Soft tissues are unremarkable. IMPRESSION: Negative. Electronically Signed   By: Marijo Conception M.D.   On: 06/25/2018 15:23    Procedures Procedures (including critical care time)  Medications Ordered in UC Medications - No data to display  Initial Impression / Assessment and Plan / UC Course  I have reviewed the triage vital signs and the nursing notes.  Pertinent labs & imaging results that were available during my care of the patient were reviewed by me and considered in my medical decision making (see chart for details).    63 year old male presents with a right leg injury.  X-ray was obtained and was negative.  Suspect underlying hematoma.  Patient also appears to have ongoing cellulitis.  Placing on doxycycline.  Advised rest and elevation.  Advised to see orthopedics if he fails improve or worsens as he may need drainage.  Final Clinical Impressions(s) / UC Diagnoses   Final diagnoses:  Injury of right lower extremity, initial encounter  Hematoma  Cellulitis of right lower extremity     Discharge Instructions     Rest, elevation.  Antibiotic as prescribed.  See [facility] clinic or [facility] if this  fails to improve or worsens.  Take care  Dr. Lacinda Axon     ED Prescriptions    Medication Sig Dispense Auth. Provider   doxycycline (VIBRAMYCIN) 100 MG capsule Take 1 capsule (100 mg total) by mouth 2 (two) times daily. 14 capsule Coral Spikes, DO     Controlled Substance Prescriptions [place] Controlled Substance Registry consulted? Not Applicable   Coral Spikes, DO 06/25/18 0941

## 2018-06-25 NOTE — Discharge Instructions (Signed)
Rest, elevation.  Antibiotic as prescribed.  See [facility] clinic or [facility] if this fails to improve or worsens.  Take care  Dr. Lacinda Axon

## 2018-06-25 NOTE — ED Triage Notes (Signed)
Patient stated 9 days ago he slipped and hit his right leg on one of the teeth to a bobcat. Patient has bruising and swelling to his left lower leg and pain.

## 2018-06-27 ENCOUNTER — Other Ambulatory Visit: Payer: Self-pay | Admitting: Anesthesiology

## 2018-06-27 DIAGNOSIS — M545 Low back pain, unspecified: Secondary | ICD-10-CM

## 2018-07-04 ENCOUNTER — Inpatient Hospital Stay: Payer: Medicare Other

## 2018-07-04 ENCOUNTER — Inpatient Hospital Stay: Payer: Medicare Other | Attending: Oncology

## 2018-07-09 ENCOUNTER — Ambulatory Visit
Admission: RE | Admit: 2018-07-09 | Discharge: 2018-07-09 | Disposition: A | Discharge status: Home / Self Care | Payer: Medicare Other | Source: Ambulatory Visit | Attending: Anesthesiology | Admitting: Anesthesiology

## 2018-07-09 ENCOUNTER — Other Ambulatory Visit: Payer: Self-pay

## 2018-07-09 DIAGNOSIS — M545 Low back pain, unspecified: Secondary | ICD-10-CM

## 2018-07-11 DIAGNOSIS — M48061 Spinal stenosis, lumbar region without neurogenic claudication: Secondary | ICD-10-CM | POA: Diagnosis not present

## 2018-07-11 DIAGNOSIS — M545 Low back pain: Secondary | ICD-10-CM | POA: Diagnosis not present

## 2018-07-11 DIAGNOSIS — Z981 Arthrodesis status: Secondary | ICD-10-CM | POA: Diagnosis not present

## 2018-07-11 DIAGNOSIS — I10 Essential (primary) hypertension: Secondary | ICD-10-CM | POA: Diagnosis not present

## 2018-07-11 DIAGNOSIS — Z6832 Body mass index (BMI) 32.0-32.9, adult: Secondary | ICD-10-CM | POA: Diagnosis not present

## 2018-07-14 ENCOUNTER — Telehealth: Payer: Self-pay | Admitting: Cardiovascular Disease

## 2018-07-14 NOTE — Telephone Encounter (Signed)
   [facility] Pre-operative Risk Assessment    Request for surgical clearance:  1. What type of surgery is being performed? Lumbar Spine facet B/L L5-5  2. When is this surgery scheduled? 08/11/2018  What type of clearance is required (medical clearance vs. Pharmacy clearance to hold med vs. Both)? Pharmacy  3. Are there any medications that need to be held prior to surgery and how long? Plavix 7 days prior  4. Practice name and name of physician performing surgery? [facility] Dr. Clydell Hakim  5. What is your office phone number [phone]   7.   What is your office fax number [phone]  8.   Anesthesia type (None, local, MAC, general) ?  Not listed   Lucienne Minks 07/14/2018, 7:48 AM  _________________________________________________________________   (provider comments below)

## 2018-07-14 NOTE — Telephone Encounter (Signed)
Pt was seen by Dr. Rockey Situ on 04/24/2018 and doing well. He was previously cleared to stop Plavix for a procedure in 01/2017. I do not see any cardiac events since that time. I placed a call to pt to check on his current status and make sure no new cardiac events. If he is doing well, we can hold Plavix as requested.   Mobile phone VM was full. I left message on home phone to return call.

## 2018-07-15 ENCOUNTER — Encounter: Payer: Self-pay | Admitting: Family Medicine

## 2018-07-15 ENCOUNTER — Other Ambulatory Visit: Payer: Self-pay

## 2018-07-15 ENCOUNTER — Ambulatory Visit (INDEPENDENT_AMBULATORY_CARE_PROVIDER_SITE_OTHER): Payer: Medicare Other | Admitting: Family Medicine

## 2018-07-15 DIAGNOSIS — L02415 Cutaneous abscess of right lower limb: Secondary | ICD-10-CM | POA: Diagnosis not present

## 2018-07-15 DIAGNOSIS — L03115 Cellulitis of right lower limb: Secondary | ICD-10-CM | POA: Diagnosis not present

## 2018-07-15 MED ORDER — DOXYCYCLINE HYCLATE 100 MG PO TABS: 100.0000 mg | ORAL_TABLET | Freq: Two times a day (BID) | ORAL | 0 refills | Status: DC

## 2018-07-15 NOTE — Progress Notes (Signed)
Virtual Visit via Telephone The purpose of this virtual visit is to provide medical care while limiting exposure to the novel coronavirus (COVID19) for both patient and office staff.  Consent was obtained for phone visit:  Yes.   Answered questions that patient had about telehealth interaction:  Yes.   I discussed the limitations, risks, security and privacy concerns of performing an evaluation and management service by telephone. I also discussed with the patient that there may be a patient responsible charge related to this service. The patient expressed understanding and agreed to proceed.  Patient Location: Home Provider Location: [facility] (Office)  PCP is Cassell Smiles, AGPCNP-BC - I am currently covering during her maternity leave.   ---------------------------------------------------------------------- Chief Complaint  Patient presents with  . Hospitalization Follow-up    leg injury right side had finished ABX wants another round if possible    S: Reviewed CMA documentation. I have called patient and gathered additional HPI as follows:  ED FOLLOW-UP VISIT  Hospital/Location: [facility] Date of ED Visit: 06/25/18  Reason for Presenting to ED: Right leg injury, infection Primary (+Secondary) Diagnosis: RLE Traumatic Injury and Cellulitis  FOLLOW-UP - ED provider note and record have been reviewed - Patient presents today about 20 days after recent ED visit. Brief summary of recent course, patient had symptoms of Right leg pain after acute injury as documented by urgent care, had x-ray at that time R tib fib negative for fracture, due to redness he was placed on antibiotics for cellulitis coverage - Interval update, he did well on doxycycline with good results, but it did not 100% resolve, nearly did, but now has some residual redness and mild drainage of pus, he is using compresses and peroxide  - Today reports overall has done well after  discharge from ED. Symptoms of pain have nearly resolved, and still has residual redness, he is requesting one repeat round of antibiotics  Denies any fevers, chills, sweats, body ache, cough, shortness of breath, sinus pain or pressure, headache, abdominal pain, diarrhea, numbness weakness tingling in leg  Past Medical History:  Diagnosis Date  . Anxiety   . Arthritis   . Backache, unspecified   . Depressive disorder, not elsewhere classified   . Gout   . Hyperlipidemia   . Hypertension   . Lipoma of back 02/09/2015  . Measles   . Mobitz (type) II atrioventricular block   . Mumps   . Other hemochromatosis 12/05/2016  . Sleep apnea    Social History   Tobacco Use  . Smoking status: Never Smoker  . Smokeless tobacco: Current User    Types: Snuff  Substance Use Topics  . Alcohol use: Yes    Alcohol/week: 2.0 - 3.0 standard drinks    Types: 2 - 3 Cans of beer per week    Comment: ocassionally  . Drug use: No    Current Outpatient Medications:  .  allopurinol (ZYLOPRIM) 100 MG tablet, Take 1 tablet (100 mg total) by mouth daily., Disp: 90 tablet, Rfl: 1 .  aspirin 81 MG chewable tablet, Chew 1 tablet (81 mg total) by mouth daily., Disp: 30 tablet, Rfl: 3 .  atorvastatin (LIPITOR) 20 MG tablet, Take 1 tablet (20 mg total) by mouth daily., Disp: 90 tablet, Rfl: 2 .  cetirizine (ZYRTEC) 10 MG tablet, Take 10 mg by mouth as needed for allergies. Reported on 02/09/2015, Disp: , Rfl:  .  clopidogrel (PLAVIX) 75 MG tablet, Take 1 tablet (75 mg total)  by mouth daily., Disp: 90 tablet, Rfl: 2 .  colchicine 0.6 MG tablet, Take 2 tabs (1.2 mg) once dose at start of gout flare.  Then take 1 tab (0.6 mg) every 12 hours until end of gout flare or max of 14 days., Disp: 30 tablet, Rfl: 1 .  diclofenac (VOLTAREN) 50 MG EC tablet, Take 1 tablet (50 mg total) by mouth 2 (two) times daily as needed for moderate pain (arthritis)., Disp: 60 tablet, Rfl: 5 .  DULoxetine (CYMBALTA) 60 MG capsule, Take 1  capsule (60 mg total) by mouth 2 (two) times daily., Disp: 180 capsule, Rfl: 1 .  esomeprazole (NEXIUM) 40 MG capsule, , Disp: , Rfl:  .  ezetimibe (ZETIA) 10 MG tablet, Take 1 tablet (10 mg total) by mouth daily., Disp: 90 tablet, Rfl: 2 .  hydrOXYzine (ATARAX/VISTARIL) 25 MG tablet, Take 0.5-1 tablets (12.5-25 mg total) by mouth 3 (three) times daily as needed for anxiety., Disp: 30 tablet, Rfl: 0 .  losartan (COZAAR) 25 MG tablet, Take 1 tablet (25 mg total) by mouth daily., Disp: 90 tablet, Rfl: 2 .  omeprazole (PRILOSEC) 40 MG capsule, Take 1 capsule (40 mg total) by mouth daily., Disp: 90 capsule, Rfl: 3 .  oxyCODONE-acetaminophen (PERCOCET) 10-325 MG tablet, Take 1 tablet by mouth daily as needed for pain. , Disp: , Rfl:  .  tiZANidine (ZANAFLEX) 4 MG capsule, Take 4 mg by mouth 3 (three) times daily as needed., Disp: , Rfl:  .  triamcinolone (NASACORT) 55 MCG/ACT AERO nasal inhaler, Place 2 sprays into the nose daily., Disp: 1 Inhaler, Rfl: 12 .  triamcinolone cream (KENALOG) 0.1 %, Apply 1 application topically 2 (two) times daily. (Patient taking differently: Apply 1 application topically as needed. ), Disp: 30 g, Rfl: 0 .  doxycycline (VIBRA-TABS) 100 MG tablet, Take 1 tablet (100 mg total) by mouth 2 (two) times daily. For 7 days. Take with full glass of water, stay upright 30 min after taking., Disp: 14 tablet, Rfl: 0  Depression screen [facility] 2/9 07/15/2018 03/11/2018 02/20/2018  Decreased Interest 0 0 1  Down, Depressed, Hopeless 0 0 0  PHQ - 2 Score 0 0 1  Altered sleeping 0 0 0  Tired, decreased energy 1 0 0  Change in appetite 0 0 0  Feeling bad or failure about yourself  0 0 0  Trouble concentrating 0 0 0  Moving slowly or fidgety/restless 0 0 0  Suicidal thoughts 0 0 0  PHQ-9 Score 1 0 1  Difficult doing work/chores Not difficult at all Not difficult at all Not difficult at all    GAD 7 : Generalized Anxiety Score 07/15/2018 03/11/2018 02/20/2018 07/16/2017  Nervous, Anxious, on  Edge 1 0 1 2  Control/stop worrying 1 0 1 3  Worry too much - different things 1 0 1 3  Trouble relaxing 1 0 0 2  Restless 1 0 0 2  Easily annoyed or irritable 1 0 1 2  Afraid - awful might happen 1 0 0 0  Total GAD 7 Score 7 0 4 14  Anxiety Difficulty Not difficult at all Not difficult at all Not difficult at all Somewhat difficult    -------------------------------------------------------------------------- O: No physical exam performed due to remote telephone encounter.  Lab results reviewed.  Recent Results (from the past 2160 hour(s))  CBC with Differential/Platelet     Status: None   Collection Time: 06/05/18  3:06 PM  Result Value Ref Range   WBC 7.9 4.0 -  10.5 K/uL   RBC 4.59 4.22 - 5.81 MIL/uL   Hemoglobin 14.6 13.0 - 17.0 g/dL   HCT 43.1 39.0 - 52.0 %   MCV 93.9 80.0 - 100.0 fL   MCH 31.8 26.0 - 34.0 pg   MCHC 33.9 30.0 - 36.0 g/dL   RDW 12.2 11.5 - 15.5 %   Platelets 284 150 - 400 K/uL   nRBC 0.0 0.0 - 0.2 %   Neutrophils Relative % 61 %   Neutro Abs 4.9 1.7 - 7.7 K/uL   Lymphocytes Relative 25 %   Lymphs Abs 2.0 0.7 - 4.0 K/uL   Monocytes Relative 9 %   Monocytes Absolute 0.7 0.1 - 1.0 K/uL   Eosinophils Relative 4 %   Eosinophils Absolute 0.3 0.0 - 0.5 K/uL   Basophils Relative 1 %   Basophils Absolute 0.1 0.0 - 0.1 K/uL   Immature Granulocytes 0 %   Abs Immature Granulocytes 0.02 0.00 - 0.07 K/uL    Comment: Performed at [facility], [address] [place], [place] [address]  Iron and TIBC     Status: None   Collection Time: 06/05/18  3:06 PM  Result Value Ref Range   Iron 86 45 - 182 ug/dL   TIBC 306 250 - 450 ug/dL   Saturation Ratios 28 17.9 - 39.5 %   UIBC 221 ug/dL    Comment: Performed at [facility], [address] [place], [place] [address]  Ferritin     Status: None   Collection Time: 06/05/18  3:06 PM  Result Value Ref Range   Ferritin 193 24 - 336 ng/mL    Comment: Performed at [facility], [address] [place], [place] [address]  Comprehensive metabolic panel     Status: None   Collection Time: 06/05/18  3:06 PM  Result Value Ref Range   Sodium 139 135 - 145 mmol/L   Potassium 4.4 3.5 - 5.1 mmol/L   Chloride 107 98 - 111 mmol/L   CO2 25 22 - 32 mmol/L   Glucose, Bld 89 70 - 99 mg/dL   BUN 19 8 - 23 mg/dL   Creatinine, Ser 1.05 0.61 - 1.24 mg/dL   Calcium 9.0 8.9 - 10.3 mg/dL   Total Protein 7.7 6.5 - 8.1 g/dL   Albumin 4.3 3.5 - 5.0 g/dL   AST 31 15 - 41 U/L   ALT 43 0 - 44 U/L   Alkaline Phosphatase 63 38 - 126 U/L   Total Bilirubin 0.4 0.3 - 1.2 mg/dL   GFR calc non Af Amer >60 >60 mL/min   GFR calc Af Amer >60 >60 mL/min   Anion gap 7 5 - 15    Comment: Performed at [facility], [address] [place], [place] [address]    -------------------------------------------------------------------------- A&P:  Problem List Items Addressed This Visit    None    Visit Diagnoses    Cellulitis and abscess of right lower extremity    -  Primary   Relevant Medications   doxycycline (VIBRA-TABS) 100 MG tablet     Consistent with persistent vs recurrent acute Right lower extremity cellulitis vs abscess, previously onset from leg injury, and had secondary infection. Now nearly resolved on initial doxycycline but seems to have lingering or persistent symptoms  Plan: 1. Limited by virtual visit for this case - advised that based on history we can offer repeat Doxycycline antibiotic for 7 days, twice daily, instructions per rx 2. Warm compresses  and routine wound care 3. Return precautions given if worsening  Next step would be in office for I&D vs Urgent Care    Meds ordered this encounter  Medications  . doxycycline (VIBRA-TABS) 100 MG tablet    Sig: Take 1 tablet (100 mg total) by mouth 2 (two) times daily. For 7 days. Take with full glass of water, stay upright 30 min after taking.    Dispense:  14 tablet    Refill:  0    Follow-up: - Return in 1 week as  needed if unresolved  Patient verbalizes understanding with the above medical recommendations including the limitation of remote medical advice.  Specific follow-up and call-back criteria were given for patient to follow-up or seek medical care more urgently if needed.   - Time spent in direct consultation with patient on phone: 15 minutes  Nobie Putnam, [facility] 07/15/2018, 11:10 AM

## 2018-07-15 NOTE — Patient Instructions (Addendum)
Repeat Doxycycline  Start taking Doxycycline antibiotic 100mg  twice daily for 7 days. Take with full glass of water and stay upright for at least 30 min after taking, may be seated or standing, but should NOT lay down. This is just a safety precaution, if this medicine does not go all the way down throat well it could cause some burning discomfort to throat and esophagus.  If still not improving consider drainage in office or at urgent care.   If not improving you may need to return for re-evaluation. But if more severe worsening such as spreading redness or streaking redness, significantly larger size, persistent drainage of pus, increased pain, fevers/chills, nausea vomiting and cannot take antibiotic. If significantly worse symptoms or most of these symptoms, would recommend going straight to Hospital Emergency Dept as you may require IV antibiotics instead.    Please schedule a Follow-up Appointment to: Return in about 1 week (around 07/22/2018), or if symptoms worsen or fail to improve, for cellulitis.  If you have any other questions or concerns, please feel free to call the office or send a message through [place]. You may also schedule an earlier appointment if necessary.  Additionally, you may be receiving a survey about your experience at our office within a few days to 1 week by e-mail or mail. We value your feedback.  Nobie Putnam, DO [facility]

## 2018-07-16 NOTE — Telephone Encounter (Signed)
I tried to call Robert Greer, when calling his cell phone number, it goes straight to voicemail without even ringing, however voicemail is full therefore unable to leave message. When calling his home number, it automatically disconnect without ringing either. I have tried each of the above number twice without success

## 2018-07-17 ENCOUNTER — Other Ambulatory Visit: Payer: Self-pay | Admitting: Gastroenterology

## 2018-07-17 DIAGNOSIS — K921 Melena: Secondary | ICD-10-CM

## 2018-07-21 NOTE — Telephone Encounter (Addendum)
   Primary Cardiologist: Ida Rogue, MD  Chart reviewed as part of pre-operative protocol coverage. Patient was contacted 07/21/2018 in reference to pre-operative risk assessment for pending surgery as outlined below.  Kashus Karlen Demaria was last seen on 04/25/18 by Dr. Rockey Situ.  Since that [place], Jacarius Handel Diez has done well.  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 He denies anginal symptoms and changes in his cardiac history. He previously paused plavix for 5 days for a colonoscopy in Jan 4259 without complications. Since he is doing well and had no new anginal symptoms, will advise to hold plavix for 7 days prior to back injections.  Therefore, based on ACC/AHA guidelines, the patient would be at acceptable risk for the planned procedure without further cardiovascular testing.   I will route this recommendation to the requesting party via Epic fax function and remove from pre-op pool.  Please call with questions.  Tami Lin Aracelia Brinson, PA 07/21/2018, 8:36 AM

## 2018-07-22 NOTE — Telephone Encounter (Signed)
Okay to hold Plavix 7 days stay on aspirin Acceptable risk for procedure

## 2018-07-29 NOTE — Telephone Encounter (Signed)
[facility] calling States they never received clearance information  Please re-fax information to [phone]

## 2018-07-30 NOTE — Telephone Encounter (Signed)
Refaxed via epic to [employer] .    [phone]

## 2018-07-31 NOTE — Telephone Encounter (Signed)
Called requesting office and the fax was received.

## 2018-08-01 ENCOUNTER — Inpatient Hospital Stay: Payer: Medicare Other

## 2018-08-05 ENCOUNTER — Inpatient Hospital Stay: Payer: Medicare Other | Attending: Oncology

## 2018-08-05 ENCOUNTER — Inpatient Hospital Stay: Payer: Medicare Other

## 2018-08-05 ENCOUNTER — Other Ambulatory Visit: Payer: Self-pay

## 2018-08-05 DIAGNOSIS — G4730 Sleep apnea, unspecified: Secondary | ICD-10-CM | POA: Diagnosis not present

## 2018-08-05 DIAGNOSIS — Z7982 Long term (current) use of aspirin: Secondary | ICD-10-CM | POA: Diagnosis not present

## 2018-08-05 DIAGNOSIS — M109 Gout, unspecified: Secondary | ICD-10-CM | POA: Diagnosis not present

## 2018-08-05 DIAGNOSIS — F419 Anxiety disorder, unspecified: Secondary | ICD-10-CM | POA: Insufficient documentation

## 2018-08-05 DIAGNOSIS — Z79899 Other long term (current) drug therapy: Secondary | ICD-10-CM | POA: Diagnosis not present

## 2018-08-05 DIAGNOSIS — F329 Major depressive disorder, single episode, unspecified: Secondary | ICD-10-CM | POA: Insufficient documentation

## 2018-08-05 DIAGNOSIS — E785 Hyperlipidemia, unspecified: Secondary | ICD-10-CM | POA: Insufficient documentation

## 2018-08-05 DIAGNOSIS — I10 Essential (primary) hypertension: Secondary | ICD-10-CM | POA: Insufficient documentation

## 2018-08-05 DIAGNOSIS — M1990 Unspecified osteoarthritis, unspecified site: Secondary | ICD-10-CM | POA: Insufficient documentation

## 2018-08-05 DIAGNOSIS — Z791 Long term (current) use of non-steroidal anti-inflammatories (NSAID): Secondary | ICD-10-CM | POA: Diagnosis not present

## 2018-08-05 DIAGNOSIS — Z955 Presence of coronary angioplasty implant and graft: Secondary | ICD-10-CM | POA: Insufficient documentation

## 2018-08-05 DIAGNOSIS — I2510 Atherosclerotic heart disease of native coronary artery without angina pectoris: Secondary | ICD-10-CM | POA: Diagnosis not present

## 2018-08-05 DIAGNOSIS — R5383 Other fatigue: Secondary | ICD-10-CM

## 2018-08-05 LAB — HEMOGLOBIN AND HEMATOCRIT, BLOOD
HCT: 42.1 % (ref 39.0–52.0)
Hemoglobin: 14.2 g/dL (ref 13.0–17.0)

## 2018-08-05 MED ORDER — SODIUM CHLORIDE 0.9 % IV SOLN
Freq: Once | INTRAVENOUS | Status: DC
  Filled 2018-08-05: qty 250

## 2018-08-11 DIAGNOSIS — M47816 Spondylosis without myelopathy or radiculopathy, lumbar region: Secondary | ICD-10-CM | POA: Diagnosis not present

## 2018-08-21 ENCOUNTER — Encounter: Payer: Self-pay | Admitting: Nurse Practitioner

## 2018-08-21 ENCOUNTER — Ambulatory Visit (INDEPENDENT_AMBULATORY_CARE_PROVIDER_SITE_OTHER): Payer: Medicare Other | Admitting: Nurse Practitioner

## 2018-08-21 ENCOUNTER — Other Ambulatory Visit: Payer: Self-pay

## 2018-08-21 VITALS — BP 144/74 | HR 82 | Ht 72.0 in | Wt 237.2 lb

## 2018-08-21 DIAGNOSIS — H9193 Unspecified hearing loss, bilateral: Secondary | ICD-10-CM | POA: Diagnosis not present

## 2018-08-21 DIAGNOSIS — Z23 Encounter for immunization: Secondary | ICD-10-CM

## 2018-08-21 DIAGNOSIS — Z0000 Encounter for general adult medical examination without abnormal findings: Secondary | ICD-10-CM | POA: Diagnosis not present

## 2018-08-21 NOTE — Progress Notes (Signed)
Subjective:    Robert Greer is a 63 y.o. male who presents for Medicare Initial preventive examination.  Preventive Screening-Counseling & Management Problems Prior to Visit (verified) Patient Active Problem List   Diagnosis Date Noted  . Gout 06/18/2017  . Hereditary hemochromatosis ([place]) 05/09/2017  . Other hemochromatosis 12/05/2016  . Coronary artery disease of native artery of native heart with stable angina pectoris ([place]) 09/09/2016  . Anxiety 02/09/2015  . Arthritis 02/09/2015  . Clinical depression 02/09/2015  . Elevated blood sugar 02/09/2015  . History of prolonged Q-T interval on ECG 02/09/2015  . Hypercholesteremia 02/09/2015  . Hypertension 02/09/2015  . H/O measles 02/09/2015  . H/O mumps 02/09/2015  . Allergic rhinitis, seasonal 02/09/2015  . Sinus infection 02/09/2015  . GERD (gastroesophageal reflux disease) 02/09/2015  . ASCVD (arteriosclerotic cardiovascular disease) 02/09/2015  . History of ST elevation myocardial infarction (STEMI)   . Chronic low back pain 06/30/2014  . Squamous cell carcinoma of nose 06/17/2013  . Arrhythmia 08/20/2011  . Mixed hyperlipidemia 08/20/2011   Medications Prior to Visit (verified) Current Outpatient Medications on File Prior to Visit  Medication Sig Dispense Refill  . allopurinol (ZYLOPRIM) 100 MG tablet Take 1 tablet (100 mg total) by mouth daily. 90 tablet 1  . aspirin 81 MG chewable tablet Chew 1 tablet (81 mg total) by mouth daily. 30 tablet 3  . atorvastatin (LIPITOR) 20 MG tablet Take 1 tablet (20 mg total) by mouth daily. 90 tablet 2  . cetirizine (ZYRTEC) 10 MG tablet Take 10 mg by mouth as needed for allergies. Reported on 02/09/2015    . clopidogrel (PLAVIX) 75 MG tablet Take 1 tablet (75 mg total) by mouth daily. 90 tablet 2  . colchicine 0.6 MG tablet Take 2 tabs (1.2 mg) once dose at start of gout flare.  Then take 1 tab (0.6 mg) every 12 hours until end of gout flare or max of 14 days. 30 tablet 1  .  diclofenac (VOLTAREN) 50 MG EC tablet Take 1 tablet (50 mg total) by mouth 2 (two) times daily as needed for moderate pain (arthritis). 60 tablet 5  . DULoxetine (CYMBALTA) 60 MG capsule Take 1 capsule (60 mg total) by mouth 2 (two) times daily. 180 capsule 1  . ezetimibe (ZETIA) 10 MG tablet Take 1 tablet (10 mg total) by mouth daily. 90 tablet 2  . hydrOXYzine (ATARAX/VISTARIL) 25 MG tablet Take 0.5-1 tablets (12.5-25 mg total) by mouth 3 (three) times daily as needed for anxiety. 30 tablet 0  . oxyCODONE-acetaminophen (PERCOCET) 10-325 MG tablet Take 1 tablet by mouth daily as needed for pain.     Marland Kitchen triamcinolone (NASACORT) 55 MCG/ACT AERO nasal inhaler Place 2 sprays into the nose daily. 1 Inhaler 12  . esomeprazole (NEXIUM) 40 MG capsule     . losartan (COZAAR) 25 MG tablet Take 1 tablet (25 mg total) by mouth daily. 90 tablet 2  . tiZANidine (ZANAFLEX) 4 MG capsule Take 4 mg by mouth 3 (three) times daily as needed.     No current facility-administered medications on file prior to visit.    Allergies (verified) Levaquin [levofloxacin]  PAST HISTORY Family History Family History  Problem Relation Age of Onset  . Stroke Mother   . Liver disease Father   . Lung cancer Sister   . Diabetes Sister   . Liver disease Brother   . Lung cancer Paternal Grandmother    Social History Social History   Tobacco Use  . Smoking status:  Never Smoker  . Smokeless tobacco: Current User    Types: Snuff  Substance Use Topics  . Alcohol use: Yes    Alcohol/week: 2.0 - 3.0 standard drinks    Types: 2 - 3 Cans of beer per week    Comment: ocassionally    Are there smokers in your home (other than you)? No  Risk Factors Current exercise habits: The patient has a physically strenuous job, but has no regular exercise apart from work.   Dietary issues discussed: none  Hospitalizations in last 1 year: no  Cardiac risk factors: advanced age (older than 36 for men, 39 for women), dyslipidemia,  hypertension, obesity (BMI >= 30 kg/m2) and CAD known.  Depression Screen Depression screen [facility] 2/9 07/15/2018 03/11/2018 02/20/2018 02/20/2018 07/16/2017  Decreased Interest 0 0 1 1 1   Down, Depressed, Hopeless 0 0 0 0 1  PHQ - 2 Score 0 0 1 1 2   Altered sleeping 0 0 0 - 0  Tired, decreased energy 1 0 0 - 2  Change in appetite 0 0 0 - 1  Feeling bad or failure about yourself  0 0 0 - 2  Trouble concentrating 0 0 0 - 1  Moving slowly or fidgety/restless 0 0 0 - 0  Suicidal thoughts 0 0 0 - 0  PHQ-9 Score 1 0 1 - 8  Difficult doing work/chores Not difficult at all Not difficult at all Not difficult at all - Somewhat difficult     Activities of Daily Living In your present state of health, do you have any difficulty performing the following activities?:  Driving? No Managing money?  No Feeding yourself? No Getting from bed to chair? No Climbing a flight of stairs? No Preparing food and eating?: No Bathing or showering? No Getting dressed: No Getting to the toilet? No Using the toilet:No Moving around from place to place: No In the past year have you fallen or had a near fall?:No   Are you sexually active?  Yes  Do you have more than one partner?  No  Hearing Difficulties: Yes Do you often ask people to speak up or repeat themselves? Yes Do you experience ringing or noises in your ears? Yes Do you have difficulty understanding soft or whispered voices? Yes   Do you feel that you have a problem with memory? No  Do you often misplace items? No  Do you feel safe at home?  Yes  Cognitive Testing  Alert? Yes  Normal Appearance?Yes  Oriented to person? Yes  Place? Yes   Time? Yes  Displays appropriate judgment?Yes   6CIT Screen 08/21/2018  What Year? 0 points  What month? 0 points  What time? 0 points  Count back from 20 0 points  Months in reverse 2 points  Repeat phrase 0 points  Total Score 2    Advanced Directives  Advanced Directives have been discussed with the  patient? Yes   List the Names of Other Physician/Practitioners you currently use: 1. Dr. Ellin Mayhew 2. Dr. Brayton Mars  Medical Services received from non-[facility] providers in the past year:  1. neurosurgeon [facility] Dr Brayton Mars  Immunization History  Administered Date(s) Administered  . Influenza,inj,Quad PF,6+ Mos 01/01/2018  . Influenza-Unspecified 12/17/2014, 11/03/2015  . Tdap 07/02/2017, 02/04/2018  . Zoster 05/02/2015    Screening Tests Health Maintenance  Topic Date Due  . PNA vac Low Risk Adult (1 of 2 - PCV13) 02/23/2018  . INFLUENZA VACCINE  08/23/2018  . COLONOSCOPY  02/15/2027  .  TETANUS/TDAP  02/05/2028  . Hepatitis C Screening  Completed  . HIV Screening  Completed    History reviewed: allergies, current medications, past family history, past medical history, past social history, past surgical history and problem list  Review of Systems ROS   Objective:    BP (!) 144/74 (BP Location: Left Arm, Patient Position: Sitting, Cuff Size: Normal)   Pulse 82   Ht 6' (1.829 m)   Wt 237 lb 3.2 oz (107.6 kg)   BMI 32.17 kg/m    Hearing Screening   Method: Audiometry   125Hz  250Hz  500Hz  1000Hz  2000Hz  3000Hz  4000Hz  6000Hz  8000Hz   Right ear:   failed failed failed passed passed    Left ear:   failed failed failed Pass Pass      Visual Acuity Screening   Right eye Left eye Both eyes  Without correction: 20/50 20/30 20/30   With correction:       Filed Weights   08/21/18 1540  Weight: 237 lb 3.2 oz (107.6 kg)    Physical Exam  08/21/18 EKG: NSR  HR 78 QRS: 149ms     PR 119ms QT 324ms   QTc 446ms Axis deviation: NO    Assessment and Plan:    Problem List Items Addressed This Visit    None    Visit Diagnoses    Need for vaccination against Streptococcus pneumoniae using pneumococcal conjugate vaccine 13    -  Primary   Relevant Orders   Pneumococcal conjugate vaccine 13-valent IM (Completed)   Bilateral hearing loss, unspecified hearing loss type        Relevant Orders   Ambulatory referral to ENT   Initial Medicare annual wellness visit       Relevant Orders   Ambulatory referral to ENT      During the course of the visit the patient was educated and counseled about appropriate screening and preventive services including:    Pneumococcal vaccine   Screening electrocardiogram  Nutrition counseling   Advanced directives: has an advanced directive - a copy HAS NOT been provided.  Diet review for nutrition referral? n/a  Patient Instructions (the written plan) was given to the patient.  Medicare Attestation I have personally reviewed: The patient's medical and social history Their use of alcohol, tobacco or illicit drugs Their current medications and supplements The patient's functional ability including ADLs,fall risks, home safety risks, cognitive, and hearing and visual impairment Diet and physical activities Evidence for depression or mood disorders  The patient's weight, height, BMI, and visual acuity have been recorded in the chart.  I have made referrals, counseling, and provided education to the patient based on review of the above and I have provided the patient with a written personalized care plan for preventive services.     Cassell Smiles, DNP, AGPCNP-BC Adult Gerontology Primary Care Nurse Practitioner [facility] 08/21/2018, 4:02 PM

## 2018-08-21 NOTE — Patient Instructions (Addendum)
  Robert Greer , Thank you for taking time to come for your Medicare Wellness Visit. I appreciate your ongoing commitment to your health goals. Please review the following plan we discussed and let me know if I can assist you in the future.   These are the goals we discussed: Goals    .  Acknowledge receipt of Advanced Directive package     Bring documents to clinic to place on file    . Obtain Annual Eye Exam  Have hearing evaluation with [facility] - referral is placed       This is a list of the screening recommended for you and due dates:  Health Maintenance  Topic Date Due  . Pneumonia vaccines (1 of 2 - PCV13) 02/23/2018  . Flu Shot  08/23/2018  . Colon Cancer Screening  02/15/2027  . Tetanus Vaccine  02/05/2028  .  Hepatitis C: One time screening is recommended by Center for Disease Control  (CDC) for  adults born from [birth year] through 1967.   Completed  . HIV Screening  Completed

## 2018-08-22 ENCOUNTER — Encounter: Payer: Self-pay | Admitting: Nurse Practitioner

## 2018-08-27 ENCOUNTER — Inpatient Hospital Stay: Payer: Medicare Other | Attending: Oncology

## 2018-08-27 ENCOUNTER — Other Ambulatory Visit: Payer: Self-pay

## 2018-08-27 DIAGNOSIS — M109 Gout, unspecified: Secondary | ICD-10-CM | POA: Diagnosis not present

## 2018-08-27 DIAGNOSIS — I10 Essential (primary) hypertension: Secondary | ICD-10-CM | POA: Insufficient documentation

## 2018-08-27 DIAGNOSIS — Z955 Presence of coronary angioplasty implant and graft: Secondary | ICD-10-CM | POA: Insufficient documentation

## 2018-08-27 DIAGNOSIS — Z7982 Long term (current) use of aspirin: Secondary | ICD-10-CM | POA: Insufficient documentation

## 2018-08-27 DIAGNOSIS — I2510 Atherosclerotic heart disease of native coronary artery without angina pectoris: Secondary | ICD-10-CM | POA: Diagnosis not present

## 2018-08-27 DIAGNOSIS — M2550 Pain in unspecified joint: Secondary | ICD-10-CM

## 2018-08-27 DIAGNOSIS — G4730 Sleep apnea, unspecified: Secondary | ICD-10-CM | POA: Diagnosis not present

## 2018-08-27 DIAGNOSIS — Z79899 Other long term (current) drug therapy: Secondary | ICD-10-CM | POA: Diagnosis not present

## 2018-08-27 DIAGNOSIS — E785 Hyperlipidemia, unspecified: Secondary | ICD-10-CM | POA: Insufficient documentation

## 2018-08-27 DIAGNOSIS — Z791 Long term (current) use of non-steroidal anti-inflammatories (NSAID): Secondary | ICD-10-CM | POA: Diagnosis not present

## 2018-08-27 DIAGNOSIS — M1990 Unspecified osteoarthritis, unspecified site: Secondary | ICD-10-CM | POA: Diagnosis not present

## 2018-08-27 DIAGNOSIS — F419 Anxiety disorder, unspecified: Secondary | ICD-10-CM | POA: Insufficient documentation

## 2018-08-27 DIAGNOSIS — R5383 Other fatigue: Secondary | ICD-10-CM

## 2018-08-27 DIAGNOSIS — F329 Major depressive disorder, single episode, unspecified: Secondary | ICD-10-CM | POA: Insufficient documentation

## 2018-08-27 LAB — CBC WITH DIFFERENTIAL/PLATELET
Abs Immature Granulocytes: 0.03 10*3/uL (ref 0.00–0.07)
Basophils Absolute: 0 10*3/uL (ref 0.0–0.1)
Basophils Relative: 0 %
Eosinophils Absolute: 0.2 10*3/uL (ref 0.0–0.5)
Eosinophils Relative: 3 %
HCT: 43 % (ref 39.0–52.0)
Hemoglobin: 14.3 g/dL (ref 13.0–17.0)
Immature Granulocytes: 0 %
Lymphocytes Relative: 20 %
Lymphs Abs: 1.6 10*3/uL (ref 0.7–4.0)
MCH: 32.4 pg (ref 26.0–34.0)
MCHC: 33.3 g/dL (ref 30.0–36.0)
MCV: 97.5 fL (ref 80.0–100.0)
Monocytes Absolute: 0.6 10*3/uL (ref 0.1–1.0)
Monocytes Relative: 8 %
Neutro Abs: 5.6 10*3/uL (ref 1.7–7.7)
Neutrophils Relative %: 69 %
Platelets: 256 10*3/uL (ref 150–400)
RBC: 4.41 MIL/uL (ref 4.22–5.81)
RDW: 12.2 % (ref 11.5–15.5)
WBC: 8.2 10*3/uL (ref 4.0–10.5)
nRBC: 0 % (ref 0.0–0.2)

## 2018-08-27 LAB — SEDIMENTATION RATE: Sed Rate: 6 mm/hr (ref 0–20)

## 2018-08-27 LAB — C-REACTIVE PROTEIN: CRP: <0.8 mg/dL (ref <1.0)

## 2018-08-27 LAB — IRON AND TIBC
Iron: 94 ug/dL (ref 45–182)
Saturation Ratios: 30 % (ref 17.9–39.5)
TIBC: 326 ug/dL (ref 250–450)
UIBC: 228 ug/dL

## 2018-08-27 LAB — TSH: TSH: 3.339 u[IU]/mL (ref 0.350–4.500)

## 2018-08-27 LAB — FERRITIN: Ferritin: 108 ng/mL (ref 24–336)

## 2018-08-29 ENCOUNTER — Inpatient Hospital Stay: Payer: Medicare Other

## 2018-08-29 ENCOUNTER — Telehealth: Payer: Self-pay | Admitting: *Deleted

## 2018-08-29 ENCOUNTER — Other Ambulatory Visit: Payer: PRIVATE HEALTH INSURANCE

## 2018-08-29 ENCOUNTER — Inpatient Hospital Stay: Payer: Medicare Other | Admitting: Oncology

## 2018-08-29 LAB — ANTINUCLEAR ANTIBODIES, IFA: ANA Ab, IFA: NEGATIVE

## 2018-08-29 NOTE — Telephone Encounter (Signed)
Pt called and stated that he was running a fever and will call back at a later date to R/S 08/29/18 lab/MD appt were cancelled.

## 2018-09-08 DIAGNOSIS — M545 Low back pain: Secondary | ICD-10-CM | POA: Diagnosis not present

## 2018-09-08 DIAGNOSIS — I10 Essential (primary) hypertension: Secondary | ICD-10-CM | POA: Diagnosis not present

## 2018-09-08 DIAGNOSIS — Z6832 Body mass index (BMI) 32.0-32.9, adult: Secondary | ICD-10-CM | POA: Diagnosis not present

## 2018-09-08 DIAGNOSIS — M47816 Spondylosis without myelopathy or radiculopathy, lumbar region: Secondary | ICD-10-CM | POA: Diagnosis not present

## 2018-09-09 ENCOUNTER — Ambulatory Visit: Payer: Medicare Other | Admitting: Nurse Practitioner

## 2018-09-11 ENCOUNTER — Encounter: Payer: Self-pay | Admitting: Nurse Practitioner

## 2018-09-11 ENCOUNTER — Other Ambulatory Visit: Payer: Self-pay

## 2018-09-11 ENCOUNTER — Ambulatory Visit (INDEPENDENT_AMBULATORY_CARE_PROVIDER_SITE_OTHER): Payer: Medicare Other | Admitting: Nurse Practitioner

## 2018-09-11 VITALS — BP 118/63 | HR 84 | Ht 72.0 in | Wt 244.8 lb

## 2018-09-11 DIAGNOSIS — F419 Anxiety disorder, unspecified: Secondary | ICD-10-CM | POA: Diagnosis not present

## 2018-09-11 MED ORDER — GABAPENTIN 100 MG PO CAPS: ORAL_CAPSULE | ORAL | 3 refills | Status: DC

## 2018-09-11 NOTE — Progress Notes (Signed)
Subjective:    Patient ID: Robert Greer, male    DOB: 11/30/1955, 63 y.o.   MRN: [id number]  Robert Greer is a 63 y.o. male presenting on 09/11/2018 for Anxiety  HPI Anxiety Continues getting overwhelmed.  Job remains stressful.  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  May retire.  Patient is taking hydroxyzine every A[place], but notes needs for more anxiety control.   - Anhedonia: Does not care to do the work but has to do the work. - Declines in past to participate in therapy or other maintenance med.  Today, requests to start maintenance medication.  Depression screen [facility] 2/9 09/11/2018 08/21/2018 07/15/2018 03/11/2018 02/20/2018  Decreased Interest 0 0 0 0 1  Down, Depressed, Hopeless 0 1 0 0 0  PHQ - 2 Score 0 1 0 0 1  Altered sleeping 0 0 0 0 0  Tired, decreased energy 0 1 1 0 0  Change in appetite 0 0 0 0 0  Feeling bad or failure about yourself  0 0 0 0 0  Trouble concentrating 0 0 0 0 0  Moving slowly or fidgety/restless 0 0 0 0 0  Suicidal thoughts 0 - 0 0 0  PHQ-9 Score 0 2 1 0 1  Difficult doing work/chores Not difficult at all Not difficult at all Not difficult at all Not difficult at all Not difficult at all    GAD 7 : Generalized Anxiety Score 09/11/2018 07/15/2018 03/11/2018 02/20/2018  Nervous, Anxious, on Edge 1 1 0 1  Control/stop worrying 0 1 0 1  Worry too much - different things 0 1 0 1  Trouble relaxing 0 1 0 0  Restless 0 1 0 0  Easily annoyed or irritable 1 1 0 1  Afraid - awful might happen 0 1 0 0  Total GAD 7 Score 2 7 0 4  Anxiety Difficulty Somewhat difficult Not difficult at all Not difficult at all Not difficult at all   Social History   Tobacco Use  . Smoking status: Never Smoker  . Smokeless tobacco: Current User    Types: Snuff  Substance Use Topics  . Alcohol use: Yes    Alcohol/week: 2.0 - 3.0 standard drinks    Types: 2 - 3 Cans of beer per week    Comment: ocassionally  . Drug use: No    Review  of Systems Per HPI unless specifically indicated above     Objective:    BP 118/63 (BP Location: Left Arm, Patient Position: Sitting, Cuff Size: Large)   Pulse 84   Ht 6' (1.829 m)   Wt 244 lb 12.8 oz (111 kg)   BMI 33.20 kg/m   Wt Readings from Last 3 Encounters:  09/11/18 244 lb 12.8 oz (111 kg)  08/21/18 237 lb 3.2 oz (107.6 kg)  06/25/18 245 lb (111.1 kg)    Physical Exam Vitals signs reviewed.  Constitutional:      General: He is not in acute distress.    Appearance: He is well-developed. He is obese.  HENT:     Head: Normocephalic and atraumatic.  Cardiovascular:     Rate and Rhythm: Normal rate and regular rhythm.     Pulses:          Radial pulses are 2+ on the right side and 2+ on the left side.       Posterior tibial pulses are 1+ on the right side and 1+  on the left side.     Heart sounds: Normal heart sounds, S1 normal and S2 normal.  Pulmonary:     Effort: Pulmonary effort is normal. No respiratory distress.     Breath sounds: Normal breath sounds and air entry.  Musculoskeletal:     Right lower leg: No edema.     Left lower leg: No edema.  Skin:    General: Skin is warm and dry.     Capillary Refill: Capillary refill takes less than 2 seconds.  Neurological:     General: No focal deficit present.     Mental Status: He is alert and oriented to person, place, and time. Mental status is at baseline.  Psychiatric:        Attention and Perception: Attention normal.        Mood and Affect: Affect normal. Mood is anxious.        Speech: Speech normal.        Behavior: Behavior normal. Behavior is cooperative.        Thought Content: Thought content normal. Thought content does not include homicidal or suicidal ideation. Thought content does not include homicidal or suicidal plan.        Cognition and Memory: Cognition and memory normal.        Judgment: Judgment normal.    Results for orders placed or performed in visit on 08/27/18  Sedimentation rate   Result Value Ref Range   Sed Rate 6 0 - 20 mm/hr  ANA, IFA (with reflex)  Result Value Ref Range   ANA Ab, IFA Negative   Ferritin  Result Value Ref Range   Ferritin 108 24 - 336 ng/mL  CBC with Differential/Platelet  Result Value Ref Range   WBC 8.2 4.0 - 10.5 K/uL   RBC 4.41 4.22 - 5.81 MIL/uL   Hemoglobin 14.3 13.0 - 17.0 g/dL   HCT 43.0 39.0 - 52.0 %   MCV 97.5 80.0 - 100.0 fL   MCH 32.4 26.0 - 34.0 pg   MCHC 33.3 30.0 - 36.0 g/dL   RDW 12.2 11.5 - 15.5 %   Platelets 256 150 - 400 K/uL   nRBC 0.0 0.0 - 0.2 %   Neutrophils Relative % 69 %   Neutro Abs 5.6 1.7 - 7.7 K/uL   Lymphocytes Relative 20 %   Lymphs Abs 1.6 0.7 - 4.0 K/uL   Monocytes Relative 8 %   Monocytes Absolute 0.6 0.1 - 1.0 K/uL   Eosinophils Relative 3 %   Eosinophils Absolute 0.2 0.0 - 0.5 K/uL   Basophils Relative 0 %   Basophils Absolute 0.0 0.0 - 0.1 K/uL   Immature Granulocytes 0 %   Abs Immature Granulocytes 0.03 0.00 - 0.07 K/uL  Iron and TIBC  Result Value Ref Range   Iron 94 45 - 182 ug/dL   TIBC 326 250 - 450 ug/dL   Saturation Ratios 30 17.9 - 39.5 %   UIBC 228 ug/dL  C-reactive protein  Result Value Ref Range   CRP <0.8 <1.0 mg/dL  TSH  Result Value Ref Range   TSH 3.339 0.350 - 4.500 uIU/mL      Assessment & Plan:   Problem List Items Addressed This Visit      Other   Anxiety - Primary   Relevant Medications   gabapentin (NEURONTIN) 100 MG capsule    Persistent anxiety symptoms. GAD7 scores improved, but may not fully capture patient's experience of symptoms.  Only on rescue med currently.  Plan: 1. Discussed starting buspar, SSRI.  Patient declines due to side effect potential. 2. START gabapentin 100 mg capsules.  May have anxiety and pain management benefits, which patient prefers. - See taper instructions for increasing dose.  Cautioned drowsiness. 3. Follow-up 6 weeks.   Meds ordered this encounter  Medications  . gabapentin (NEURONTIN) 100 MG capsule    Sig:  START with 1 capsule at bedtime x 7 days.  Then take 1 capsule in am and pm x 7 days.  Then take 1 capsule in am and 2 capsules in pm x 7 days.  Then stop at 2 capsules in morning and evening for anxiety.    Dispense:  90 capsule    Refill:  3    Order Specific Question:   Supervising Provider    Answer:   Olin Hauser [[id number]]    Follow up plan: Follow-up 6 weeks.  A total of 25 minutes was spent face-to-face with this patient. Greater than 50% of this time was spent in counseling and coordination of care with the patient regarding anxiety treatment, medication options as above.   Cassell Smiles, DNP, AGPCNP-BC Adult Gerontology Primary Care Nurse Practitioner [facility] 09/11/2018, 3:57 PM

## 2018-09-11 NOTE — Patient Instructions (Addendum)
Robert Greer,   Thank you for coming in to clinic today.  1. Return to clinic 4-6 weeks if no improvement.   2. START gabapentin 100 mg capsules - Week 1: Take one capsule at bedtime - Week 2: Take one capsule at bed and in am - Week 3: Take two capsules at bed and one in am - Week 4: Take two capsules at bed and in am  This medication causes drowsiness.  You may need to slow down the increase in doses if you are sleepy during the [place]. Call if you have questions about how to change the doses if you need to slow down.   Please schedule a follow-up appointment with Cassell Smiles, AGNP.   If you have any other questions or concerns, please feel free to call the clinic or send a message through [place]. You may also schedule an earlier appointment if necessary.  You will receive a survey after today's visit either digitally by e-mail or paper by [employer]. Your experiences and feedback matter to [place].  Please respond so we know how we are doing as we provide care for you.   Cassell Smiles, DNP, AGNP-BC Adult Gerontology Nurse Practitioner [facility]

## 2018-09-17 ENCOUNTER — Encounter: Payer: Self-pay | Admitting: Nurse Practitioner

## 2018-09-30 ENCOUNTER — Other Ambulatory Visit: Payer: Self-pay

## 2018-09-30 ENCOUNTER — Other Ambulatory Visit: Payer: Self-pay | Admitting: Nurse Practitioner

## 2018-09-30 ENCOUNTER — Telehealth: Payer: Self-pay

## 2018-09-30 DIAGNOSIS — F419 Anxiety disorder, unspecified: Secondary | ICD-10-CM

## 2018-09-30 DIAGNOSIS — G4730 Sleep apnea, unspecified: Secondary | ICD-10-CM

## 2018-09-30 NOTE — Telephone Encounter (Signed)
Wife called requesting we fax orders for new supplies for her and her husband to share for CPAP. I explained we do not fax orders for CPAP supplies and she said [facility] asked her to have this done before their appt on 10/01/2018. I called [facility] and Denice Paradise Lwin was never in system there but patient was listed as patient of [facility]. I asked him if he could send orders to [place] to have signed or if we need to wait until another sleep study and he advised we send a blank RX for CPAP Supplies and said they will then be prompted to fax [place] a form that has all the needs on it. Patient needs Mask, tubing, filter, and water reservoir once form is received mark for that and have PCP sign and fax to [phone]. [facility] is now [employer].

## 2018-10-02 NOTE — Telephone Encounter (Signed)
Please obtain order form and I will sign when I return.  We will not sign for any supplies for patients whom we do not treat.  I will only sign for Mr. Robert Greer.

## 2018-10-28 ENCOUNTER — Encounter: Payer: Self-pay | Admitting: Nurse Practitioner

## 2018-10-28 ENCOUNTER — Ambulatory Visit (INDEPENDENT_AMBULATORY_CARE_PROVIDER_SITE_OTHER): Payer: Medicare Other | Admitting: Nurse Practitioner

## 2018-10-28 ENCOUNTER — Other Ambulatory Visit: Payer: Self-pay

## 2018-10-28 DIAGNOSIS — G4733 Obstructive sleep apnea (adult) (pediatric): Secondary | ICD-10-CM | POA: Diagnosis not present

## 2018-10-28 NOTE — Progress Notes (Signed)
Telemedicine Encounter: Disclosed to patient at start of encounter that we will provide appropriate telemedicine services.  Patient consents to be treated via phone prior to discussion. - Patient is at his home and is accessed via telephone. - Services are provided by Cassell Smiles from [facility].  Subjective:    Patient ID: Robert Greer, male    DOB: Sep 05, 1955, 63 y.o.   MRN: [id number]  Robert Greer is a 63 y.o. male presenting on 10/28/2018 for Sleep Apnea  HPI Obstructive Sleep Apnea Patient has been on CPAP for 10 years.  Current machine "has aged out on the card."  Patient is using nasal mask.  Uses nightly 100% of the time. States he cannot sleep without it.  When he wakes for urination, puts mask right back on when getting into bed. - Patient uses and has no daytime sleepiness.   - He rarely takes naps, but when he does he is not usually using his machine.    Social History   Tobacco Use  . Smoking status: Never Smoker  . Smokeless tobacco: Current User    Types: Snuff  Substance Use Topics  . Alcohol use: Yes    Alcohol/week: 2.0 - 3.0 standard drinks    Types: 2 - 3 Cans of beer per week    Comment: ocassionally  . Drug use: No    Review of Systems Per HPI unless specifically indicated above     Objective:    There were no vitals taken for this visit.  Wt Readings from Last 3 Encounters:  09/11/18 244 lb 12.8 oz (111 kg)  08/21/18 237 lb 3.2 oz (107.6 kg)  06/25/18 245 lb (111.1 kg)    Physical Exam Patient remotely monitored.  Verbal communication appropriate.  Cognition normal.   Results for orders placed or performed in visit on 08/27/18  Sedimentation rate  Result Value Ref Range   Sed Rate 6 0 - 20 mm/hr  ANA, IFA (with reflex)  Result Value Ref Range   ANA Ab, IFA Negative   Ferritin  Result Value Ref Range   Ferritin 108 24 - 336 ng/mL  CBC with Differential/Platelet  Result Value Ref Range   WBC 8.2 4.0 - 10.5 K/uL   RBC  4.41 4.22 - 5.81 MIL/uL   Hemoglobin 14.3 13.0 - 17.0 g/dL   HCT 43.0 39.0 - 52.0 %   MCV 97.5 80.0 - 100.0 fL   MCH 32.4 26.0 - 34.0 pg   MCHC 33.3 30.0 - 36.0 g/dL   RDW 12.2 11.5 - 15.5 %   Platelets 256 150 - 400 K/uL   nRBC 0.0 0.0 - 0.2 %   Neutrophils Relative % 69 %   Neutro Abs 5.6 1.7 - 7.7 K/uL   Lymphocytes Relative 20 %   Lymphs Abs 1.6 0.7 - 4.0 K/uL   Monocytes Relative 8 %   Monocytes Absolute 0.6 0.1 - 1.0 K/uL   Eosinophils Relative 3 %   Eosinophils Absolute 0.2 0.0 - 0.5 K/uL   Basophils Relative 0 %   Basophils Absolute 0.0 0.0 - 0.1 K/uL   Immature Granulocytes 0 %   Abs Immature Granulocytes 0.03 0.00 - 0.07 K/uL  Iron and TIBC  Result Value Ref Range   Iron 94 45 - 182 ug/dL   TIBC 326 250 - 450 ug/dL   Saturation Ratios 30 17.9 - 39.5 %   UIBC 228 ug/dL  C-reactive protein  Result Value Ref Range  CRP <0.8 <1.0 mg/dL  TSH  Result Value Ref Range   TSH 3.339 0.350 - 4.500 uIU/mL      Assessment & Plan:   Problem List Items Addressed This Visit    None    Visit Diagnoses    Obstructive sleep apnea    -  Primary    Stable on CPAP.  Patient has worked to be fitted for new machine.  Now qualifies for autoBipap.  Patient use is at 100% nightly.  Never for naps, but naps are rare.  Patient has complete resolution of daytime sleepiness.  Plan: 1. Order new Auto-BiPAP 2. Continue nightly use of med 3. Follow-up prn.   - Time spent in direct consultation with patient via telemedicine about above concerns: 5 minutes  Follow up plan: Prn if worsening symptoms.  Cassell Smiles, DNP, AGPCNP-BC Adult Gerontology Primary Care Nurse Practitioner [facility] 10/28/2018, 8:15 AM

## 2018-10-31 DIAGNOSIS — Z23 Encounter for immunization: Secondary | ICD-10-CM | POA: Diagnosis not present

## 2018-11-03 DIAGNOSIS — Z5181 Encounter for therapeutic drug level monitoring: Secondary | ICD-10-CM | POA: Diagnosis not present

## 2018-11-03 DIAGNOSIS — Z6834 Body mass index (BMI) 34.0-34.9, adult: Secondary | ICD-10-CM | POA: Diagnosis not present

## 2018-11-03 DIAGNOSIS — M545 Low back pain: Secondary | ICD-10-CM | POA: Diagnosis not present

## 2018-11-03 DIAGNOSIS — Z79891 Long term (current) use of opiate analgesic: Secondary | ICD-10-CM | POA: Diagnosis not present

## 2018-11-03 DIAGNOSIS — Z79899 Other long term (current) drug therapy: Secondary | ICD-10-CM | POA: Diagnosis not present

## 2018-11-27 ENCOUNTER — Other Ambulatory Visit: Payer: Self-pay | Admitting: Nurse Practitioner

## 2018-11-27 DIAGNOSIS — M10071 Idiopathic gout, right ankle and foot: Secondary | ICD-10-CM

## 2018-12-02 ENCOUNTER — Ambulatory Visit: Payer: Medicare Other | Admitting: Family Medicine

## 2018-12-12 ENCOUNTER — Other Ambulatory Visit: Payer: Self-pay | Admitting: Nurse Practitioner

## 2018-12-12 DIAGNOSIS — F419 Anxiety disorder, unspecified: Secondary | ICD-10-CM

## 2019-01-22 ENCOUNTER — Encounter: Payer: Self-pay | Admitting: Nurse Practitioner

## 2019-01-22 ENCOUNTER — Ambulatory Visit (INDEPENDENT_AMBULATORY_CARE_PROVIDER_SITE_OTHER): Payer: Medicare Other | Admitting: Nurse Practitioner

## 2019-01-22 ENCOUNTER — Other Ambulatory Visit: Payer: Self-pay

## 2019-01-22 VITALS — BP 140/90 | HR 71 | Ht 72.0 in | Wt 252.5 lb

## 2019-01-22 DIAGNOSIS — E785 Hyperlipidemia, unspecified: Secondary | ICD-10-CM

## 2019-01-22 DIAGNOSIS — I10 Essential (primary) hypertension: Secondary | ICD-10-CM

## 2019-01-22 DIAGNOSIS — I2510 Atherosclerotic heart disease of native coronary artery without angina pectoris: Secondary | ICD-10-CM

## 2019-01-22 MED ORDER — LOSARTAN POTASSIUM 50 MG PO TABS: 50.0000 mg | ORAL_TABLET | Freq: Every day | ORAL | 3 refills | Status: DC

## 2019-01-22 MED ORDER — EZETIMIBE 10 MG PO TABS: 10.0000 mg | ORAL_TABLET | Freq: Every day | ORAL | 3 refills | Status: DC

## 2019-01-22 MED ORDER — ATORVASTATIN CALCIUM 20 MG PO TABS: 20.0000 mg | ORAL_TABLET | Freq: Every day | ORAL | 3 refills | Status: DC

## 2019-01-22 MED ORDER — CLOPIDOGREL BISULFATE 75 MG PO TABS: 75.0000 mg | ORAL_TABLET | Freq: Every day | ORAL | 3 refills | Status: DC

## 2019-01-22 NOTE — Progress Notes (Signed)
Office Visit    Patient Name: Robert Greer Date of Encounter: 01/22/2019  Primary Care Provider:  Mikey College, NP (Inactive) Primary Cardiologist:  Ida Rogue, MD  Chief Complaint    63 year old male with a history of CAD and non-STEMI status post LAD stenting with a known chronic total occlusion of the right coronary artery, hypertension, hyperlipidemia, arthritis, sleep apnea, and anxiety, who presents for follow-up of CAD.  Past Medical History    Past Medical History:  Diagnosis Date  . Anxiety   . Arthritis   . Backache, unspecified   . CAD (coronary artery disease)    a. 12/2014 NSTEMI/PCI: LM nl, LAD 85p/m (2.5x22 Resolute Integrity DES), D1 50ost, D2/3 small/nl, LCX 60d, OM1 nl, OM2 min irregs, OM3 nl, RCA 175m (likely culprilt w/ L->R, R->R collats-->Med Rx). Nl EF.  Marland Kitchen Depressive disorder, not elsewhere classified   . Gout   . History of echocardiogram    a. 12/2014 Echo: EF 60-65%, no rwma, mild MR. Nl RV fxn.  . Hyperlipidemia   . Hypertension   . Lipoma of back 02/09/2015  . Measles   . Mobitz (type) II atrioventricular block   . Mumps   . Other hemochromatosis 12/05/2016  . Sleep apnea    Past Surgical History:  Procedure Laterality Date  . ANTERIOR FUSION LUMBAR SPINE    . BACK SURGERY  2012   x2  . CARDIAC CATHETERIZATION N/A 01/10/2015   Procedure: Left Heart Cath and Coronary Angiography;  Surgeon: Wellington Hampshire, MD;  Location: [facility];  Service: Cardiovascular;  Laterality: N/A;  . CARDIAC CATHETERIZATION N/A 01/10/2015   Procedure: Coronary Stent Intervention;  Surgeon: Wellington Hampshire, MD;  Location: [facility];  Service: Cardiovascular;  Laterality: N/A;  . COLONOSCOPY  2005  . COLONOSCOPY WITH PROPOFOL N/A 02/14/2017   Procedure: COLONOSCOPY WITH PROPOFOL;  Surgeon: [name], MD;  Location: [facility];  Service: Gastroenterology;  Laterality: N/A;  . ESOPHAGOGASTRODUODENOSCOPY (EGD) WITH PROPOFOL  N/A 02/14/2017   Procedure: ESOPHAGOGASTRODUODENOSCOPY (EGD) WITH PROPOFOL;  Surgeon: [name], MD;  Location: [facility] ENDOSCOPY;  Service: Gastroenterology;  Laterality: N/A;  . KNEE SURGERY     bilateral  . TONSILLECTOMY AND ADENOIDECTOMY    . TOTAL SHOULDER REPLACEMENT Right     Allergies  Allergies  Allergen Reactions  . Buspirone Other (See Comments)    Disliked the way it made him feel  . Levaquin [Levofloxacin] Rash    History of Present Illness    63 year old male with above past medical history including CAD status post non-STEMI in December 2016.  He was found to have a total occlusion of the right coronary artery with left to right and right to right collaterals.  He also had an 85% stenosis in the LAD and a 60% stenosis in the distal left circumflex.  The RCA was felt to be the culprit vessel but as it was well collateralized, decision was made to not perform PCI to the RCA.  Instead, the LAD was successfully treated with a drug-eluting stent.  Echocardiogram during admission showed normal LV function.  Other history includes hypertension, hyperlipidemia, arthritis, sleep apnea, and anxiety.  He was last seen via telemedicine visit in April of this year, at which time he was doing well.  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  He denies chest pain, dyspnea, palpitations, PND, orthopnea, dizziness, syncope, edema, or early satiety.  He does check his blood pressure at home and typically notes diastolics in the 0000000 with systolics in the [id number] to 0000000.  Home Medications    Prior to Admission medications   Medication Sig Start Date End Date Taking? Authorizing Provider  allopurinol (ZYLOPRIM) 100 MG tablet Take 1 tablet (100 mg total) by mouth daily. 12/02/18   Karamalegos,  Devonne Doughty, DO  aspirin 81 MG chewable tablet Chew 1 tablet (81 mg total) by mouth daily. 01/11/15   Aldean Jewett, MD  atorvastatin (LIPITOR) 20 MG tablet Take 1 tablet (20 mg total) by mouth daily. 04/25/18   Minna Merritts, MD  cetirizine (ZYRTEC) 10 MG tablet Take 10 mg by mouth as needed for allergies. Reported on 02/09/2015    [provider]  clopidogrel (PLAVIX) 75 MG tablet Take 1 tablet (75 mg total) by mouth daily. 04/25/18   Minna Merritts, MD  colchicine 0.6 MG tablet Take 2 tabs (1.2 mg) once dose at start of gout flare.  Then take 1 tab (0.6 mg) every 12 hours until end of gout flare or max of 14 days. 03/11/18   Mikey College, NP  diclofenac (VOLTAREN) 50 MG EC tablet Take 1 tablet (50 mg total) by mouth 2 (two) times daily as needed for moderate pain (arthritis). 03/11/18   Mikey College, NP  DULoxetine (CYMBALTA) 60 MG capsule Take 1 capsule (60 mg total) by mouth 2 (two) times daily. 03/11/18   Mikey College, NP  esomeprazole ([place]) 40 MG capsule  07/01/15   [provider]  ezetimibe (ZETIA) 10 MG tablet Take 1 tablet (10 mg total) by mouth daily. 04/25/18   Minna Merritts, MD  gabapentin (NEURONTIN) 100 MG capsule START with 1 capsule at bedtime x 7 days.  Then take 1 capsule in am and pm x 7 days.  Then take 1 capsule in am and 2 capsules in pm x 7 days.  Then stop at 2 capsules in morning and evening for anxiety. 09/11/18   Mikey College, NP  hydrOXYzine (ATARAX/VISTARIL) 25 MG tablet Take 0.5-1 tablets (12.5-25 mg total) by mouth EVERY 8 HOURS as needed for anxiety. NEED APPT 12/23/18   Olin Hauser, DO  losartan (COZAAR) 25 MG tablet Take 1 tablet (25 mg total) by mouth daily. 04/25/18 07/24/18  Minna Merritts, MD  oxyCODONE-acetaminophen (PERCOCET) 10-325 MG tablet Take 1 tablet by mouth daily as needed for pain.     [provider]  triamcinolone (NASACORT) 55 MCG/ACT AERO nasal inhaler Place 2 sprays  into the nose daily. 03/24/18   Mikey College, NP    Review of Systems    He denies chest pain, palpitations, dyspnea, pnd, orthopnea, n, v, dizziness, syncope, edema, weight gain, or early satiety. All other systems reviewed and are otherwise negative except as noted above.  Physical Exam    VS:  BP 140/90 (BP Location: Left Arm, Patient Position: Sitting, Cuff Size: Normal)   Pulse 71   Ht 6' (1.829 m)   Wt 252 lb 8 oz (114.5 kg)   BMI 34.25 kg/m  , BMI Body mass index is 34.25 kg/m. GEN: Well nourished, well developed, in no acute distress. HEENT: normal. Neck: Supple, no JVD, carotid bruits, or masses. Cardiac: RRR, no murmurs, rubs,  or gallops. No clubbing, cyanosis, edema.  Radials/regular sinus rhythm, 71  PT 2+ and equal bilaterally.  Respiratory:  Respirations regular and unlabored, clear to auscultation bilaterally. GI: Soft, nontender, nondistended, BS + x 4. MS: no deformity or atrophy. Skin: warm and dry, no rash. Neuro:  Strength and sensation are intact. Psych: Normal affect.  Accessory Clinical Findings    ECG personally reviewed by me today -regular sinus rhythm, 71- no acute changes.  Lab Results  Component Value Date   WBC 8.2 08/27/2018   HGB 14.3 08/27/2018   HCT 43.0 08/27/2018   MCV 97.5 08/27/2018   PLT 256 08/27/2018   Lab Results  Component Value Date   CREATININE 1.05 06/05/2018   BUN 19 06/05/2018   NA 139 06/05/2018   K 4.4 06/05/2018   CL 107 06/05/2018   CO2 25 06/05/2018   Lab Results  Component Value Date   ALT 43 06/05/2018   AST 31 06/05/2018   ALKPHOS 63 06/05/2018   BILITOT 0.4 06/05/2018   Lab Results  Component Value Date   CHOL 149 01/08/2015   HDL 48 01/08/2015   LDLCALC 78 01/08/2015   TRIG 114 01/08/2015   CHOLHDL 3.1 01/08/2015    Lab Results  Component Value Date   HGBA1C 5.7 (H) 12/04/2016    Assessment & Plan    1.  Coronary artery disease: Status post prior non-STEMI in December 2016 with PCI  and drug-eluting stent placement to the proximal and mid LAD.  He had moderate circumflex disease and a chronic total occlusion of the right coronary artery with right to right and left-to-right collaterals noted at that time.  He has done well since his last visit here without any recurrence of chest pain or dyspnea.  He remains on aspirin, statin, Plavix, Zetia, and ARB therapy.  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  His ECG is normal and he feels as though he can walk on a treadmill therefore, I will schedule an ETT.  2.  Essential hypertension: Blood pressure elevated today and he says this is common for what he sees at home.  Titrating losartan to 50 mg daily.  We will follow-up a basic metabolic panel when he comes back for his treadmill.  3.  Hyperlipidemia: LDL was 78 in December 2016.  He has not had follow-up since.  We will plan to follow-up lipids and LFTs when he comes back for his treadmill.  4.  Obstructive sleep apnea: On CPAP.  5.  Disposition: Lab work and treadmill in the next 1 to 2 weeks.  Follow-up in clinic in 6 months or sooner if necessary.   Murray Hodgkins, NP 01/22/2019, 2:11 PM

## 2019-01-22 NOTE — Patient Instructions (Signed)
Medication Instructions:   We increased your Losartan to 50mg  daily.  *If you need a refill on your cardiac medications before your next appointment, please call your pharmacy*  Lab Work:  You need a Cmet and fasting lipids in 1 week.  If you have labs (blood work) drawn today and your tests are completely normal, you will receive your results only by: Marland Kitchen MyChart Message (if you have MyChart) OR . A paper copy in the mail If you have any lab test that is abnormal or we need to change your treatment, we will call you to review the results.  Testing/Procedures: Your physician has requested that you have an exercise tolerance test. For further information please visit [email or web address]. Please also follow instruction sheet, as given.  You will need a COVID test with results prior to stress test.  Follow-Up: At [facility], you and your health needs are our priority.  As part of our continuing mission to provide you with exceptional heart care, we have created designated Provider Care Teams.  These Care Teams include your primary Cardiologist (physician) and Advanced Practice Providers (APPs -  Physician Assistants and Nurse Practitioners) who all work together to provide you with the care you need, when you need it.  Your next appointment:   6 month(s)  The format for your next appointment:   In Person  Provider:    You may see Ida Rogue, MD or one of the following Advanced Practice Providers on your designated Care Team:    Murray Hodgkins, NP  Christell Faith, PA-C  Marrianne Mood, PA-C   Other Instructions  Exercise Stress Test  An exercise stress test is a test that is done to collect information about how your heart functions during exercise. The test is done while you are walking on a treadmill or using an exercise bike. The goal is to raise your heart rate and "stress" the heart. The heart is evaluated before, during, and after you exercise. An electrocardiogram  (ECG) will be used to monitor the heart, and your blood pressure will also be monitored. In some cases, nuclear scanning or an ultrasound of the heart (echocardiogram) will also be done to evaluate your heart. An exercise stress test is done to look for coronary artery disease (CAD). The test may also be done to:  Evaluate your limits of exercise during cardiac rehabilitation.  Check for high blood pressure during exercise.  Check how well you can exercise after such treatments as coronary stenting or new medicines.  Check for problems with blood flow to your arms and legs during exercise. If you have an abnormal test result, this may mean that you are not getting enough blood flow to your heart during exercise. More testing may be needed to understand why your test was not normal. Tell a health care provider about:  Any allergies you have.  All medicines you are taking, including vitamins, herbs, eye drops, creams, and over-the-counter medicines.  Any blood disorders you have.  Any surgeries you have had.  Any medical conditions you have.  Whether you are pregnant or may be pregnant. What are the risks? Generally, this is a safe procedure. However, problems may occur, including:  Pain or pressure in the following areas: ? Chest. ? Jaw or neck. ? Between your shoulder blades. ? Down your left arm.  Dizziness or lightheadedness.  Shortness of breath.  Increased or irregular heartbeats.  Nausea or vomiting.  Heart attack (rare).  Life-threatening abnormal heart rhythm (rare). What  happens before the procedure?  Follow instructions from your health care provider about eating or drinking restrictions. ? You may be told to avoid all forms of caffeine for 24 hours before the test. This includes coffee, tea (even decaffeinated tea), caffeinated sodas, chocolate, cocoa, and certain pain medicines.  Ask your health care provider about: ? Taking over-the-counter medicines,  vitamins, herbs, and supplements. ? Changing or stopping your regular medicines. This is especially important if you are taking diabetes medicines or beta-blocker medicines.  If you have diabetes, ask how you are to take your insulin or pills. It is common to adjust your insulin dose the morning of the test.  If you are taking beta-blocker medicines, it is important to talk to your health care provider about these medicines well before the date of your test. Taking beta-blocker medicines may interfere with the test. In some cases, these medicines may need to be changed or stopped 24 hours or more before the test.  If you wear a nitroglycerin patch, it may need to be removed prior to the test. Ask your health care provider if the patch should be removed before the test.  If you use an inhaler for any breathing condition, bring it with you to the test.  Do not apply lotions, powders, creams, or oils on your chest prior to the test.  Wear loose-fitting clothes and comfortable walking shoes.  Do not use any products that contain nicotine or tobacco, such as cigarettes and e-cigarettes, for 4 hours before the test or as told by your health care provider. If you need help quitting, ask your health care provider. What happens during the procedure?  Multiple electrodes will be attached to your chest.  Multiple wires will be attached to the electrodes. These will transfer the electrical impulses from your heart to the ECG machine. Your heart will be monitored both at rest and while exercising.  If you are also having an echocardiogram or nuclear scanning, images of your heart will be taken before and after you exercise.  A blood pressure cuff will be placed around your arm to measure your blood pressure throughout the test. You will feel it tighten and loosen throughout the test.  You will walk on a treadmill or use a stationary bike. If you cannot use these, you may be asked to turn a crank with  your hands.  You will start at a slow pace or level on the exercise machine. The exercise difficulty will be slowly increased to raise your heart rate. In the case of a treadmill, the speed and incline will gradually be increased.  You may be asked to periodically breathe into a tube. This measures the gases you breathe out.  You will be asked how you are feeling throughout the test. You will be asked to rate your level of exertion.  Tell the staff right away if you feel: ? Chest pain. ? Dizziness. ? Shortness of breath. ? Too fatigued to continue. ? Pain or aching in your legs or arms.  You will exercise until you have symptoms or until you reach a target heart rate. The test will also be stopped if you have changes in your blood pressure or ECG readings, or if you develop an irregular heartbeat (arrhythmia). The procedure may vary among health care providers and hospitals. What happens after the procedure?  You will sit down and recover from the exercise. Your blood pressure, heart rate, and ECG will be monitored until you recover.  You  may return to your normal schedule, including diet, activities, and medicines, unless your health care provider tells you otherwise.  It is up to you to get your test results. Ask your health care provider, or the department that is doing the test, when your results will be ready. Summary  An exercise stress test is a test that is done to collect information about how your heart functions during exercise.  This test is done to look for coronary artery disease (CAD).  During this test, you will walk on a treadmill or use an exercise bike to raise your heart rate.  It is important to follow instructions from your health care provider about eating and drinking restrictions before the test. This may include avoiding caffeine and certain medicines before the test. This information is not intended to replace advice given to you by your health care  provider. Make sure you discuss any questions you have with your health care provider. Document Revised: 10/11/2016 Document Reviewed: 03/14/2016 Elsevier Patient Education  2020 [employer].

## 2019-01-27 DIAGNOSIS — Z79891 Long term (current) use of opiate analgesic: Secondary | ICD-10-CM | POA: Diagnosis not present

## 2019-01-27 DIAGNOSIS — Z5181 Encounter for therapeutic drug level monitoring: Secondary | ICD-10-CM | POA: Diagnosis not present

## 2019-01-27 DIAGNOSIS — Z79899 Other long term (current) drug therapy: Secondary | ICD-10-CM | POA: Diagnosis not present

## 2019-01-30 ENCOUNTER — Other Ambulatory Visit
Admission: RE | Admit: 2019-01-30 | Discharge: 2019-01-30 | Disposition: A | Discharge status: Home / Self Care | Payer: Medicare Other | Source: Ambulatory Visit | Attending: Cardiovascular Disease | Admitting: Cardiovascular Disease

## 2019-01-30 DIAGNOSIS — Z20822 Contact with and (suspected) exposure to covid-19: Secondary | ICD-10-CM | POA: Diagnosis not present

## 2019-01-30 DIAGNOSIS — Z01812 Encounter for preprocedural laboratory examination: Secondary | ICD-10-CM | POA: Insufficient documentation

## 2019-01-31 LAB — SARS CORONAVIRUS 2 (TAT 6-24 HRS): SARS Coronavirus 2: NEGATIVE

## 2019-02-02 ENCOUNTER — Other Ambulatory Visit: Payer: Self-pay

## 2019-02-02 ENCOUNTER — Ambulatory Visit (INDEPENDENT_AMBULATORY_CARE_PROVIDER_SITE_OTHER): Payer: Medicare Other

## 2019-02-02 ENCOUNTER — Other Ambulatory Visit (INDEPENDENT_AMBULATORY_CARE_PROVIDER_SITE_OTHER): Payer: Medicare Other

## 2019-02-02 ENCOUNTER — Ambulatory Visit: Payer: Medicare Other | Admitting: Family Medicine

## 2019-02-02 ENCOUNTER — Other Ambulatory Visit: Payer: Self-pay | Admitting: *Deleted

## 2019-02-02 DIAGNOSIS — I10 Essential (primary) hypertension: Secondary | ICD-10-CM

## 2019-02-02 DIAGNOSIS — E785 Hyperlipidemia, unspecified: Secondary | ICD-10-CM

## 2019-02-02 DIAGNOSIS — I2510 Atherosclerotic heart disease of native coronary artery without angina pectoris: Secondary | ICD-10-CM | POA: Diagnosis not present

## 2019-02-02 NOTE — Progress Notes (Signed)
c-met  

## 2019-02-03 ENCOUNTER — Telehealth: Payer: Self-pay

## 2019-02-03 DIAGNOSIS — I2510 Atherosclerotic heart disease of native coronary artery without angina pectoris: Secondary | ICD-10-CM

## 2019-02-03 LAB — COMPREHENSIVE METABOLIC PANEL
ALT: 30 IU/L (ref 0–44)
AST: 22 IU/L (ref 0–40)
Albumin/Globulin Ratio: 2.1 (ref 1.2–2.2)
Albumin: 4.8 g/dL (ref 3.8–4.8)
Alkaline Phosphatase: 81 IU/L (ref 39–117)
BUN/Creatinine Ratio: 17 (ref 10–24)
BUN: 18 mg/dL (ref 8–27)
Bilirubin Total: 0.5 mg/dL (ref 0.0–1.2)
CO2: 25 mmol/L (ref 20–29)
Calcium: 9.2 mg/dL (ref 8.6–10.2)
Chloride: 104 mmol/L (ref 96–106)
Creatinine, Ser: 1.07 mg/dL (ref 0.76–1.27)
GFR calc Af Amer: 84 mL/min/{1.73_m2} (ref >59)
GFR calc non Af Amer: 72 mL/min/{1.73_m2} (ref >59)
Globulin, Total: 2.3 g/dL (ref 1.5–4.5)
Glucose: 96 mg/dL (ref 65–99)
Potassium: 5.4 mmol/L — ABNORMAL HIGH (ref 3.5–5.2)
Sodium: 141 mmol/L (ref 134–144)
Total Protein: 7.1 g/dL (ref 6.0–8.5)

## 2019-02-03 LAB — LIPID PANEL
Chol/HDL Ratio: 2.2 ratio (ref 0.0–5.0)
Cholesterol, Total: 149 mg/dL (ref 100–199)
HDL: 67 mg/dL (ref >39)
LDL Chol Calc (NIH): 65 mg/dL (ref 0–99)
Triglycerides: 93 mg/dL (ref 0–149)
VLDL Cholesterol Cal: 17 mg/dL (ref 5–40)

## 2019-02-03 LAB — EXERCISE TOLERANCE TEST
Estimated workload: 9 METS
Exercise duration (min): 7 min
Exercise duration (sec): 21 s
MPHR: 155 {beats}/min
Peak HR: 141 {beats}/min
Percent HR: 90 %
RPE: 13
Rest HR: 71 {beats}/min

## 2019-02-03 NOTE — Telephone Encounter (Signed)
Call to patient to discuss results and POC following lab draw.   Pt having a hard time hearing at the moment d/t current surrounds and asked me to send in mychart to ensure he does not miss anything. I agreed. Pt message sent.   No further question at this time. Order placed for bmet in 1 week.  Advised pt to call for any further questions or concerns.

## 2019-02-03 NOTE — Telephone Encounter (Signed)
-----   Message from Theora Gianotti, NP sent at 02/03/2019 10:21 AM EST ----- Kidney fxn nl. Liver enzymes nl. Lipids  @ goal (LDL 65 - <70 goal). Potassium is mildly elevated at 5.4.  This may be spurious, but  he should have a f/u bmet w/in the next week  to reassess, as he is on losartan.

## 2019-02-04 ENCOUNTER — Telehealth: Payer: Self-pay

## 2019-02-04 NOTE — Telephone Encounter (Signed)
-----   Message from Theora Gianotti, NP sent at 02/03/2019  1:39 PM EST ----- Normal  Exercise  treadmill test.  BP  was elevated, though he may not have taken morning medication.  He should follow bp at home, as we  may need to adjust his losartan further.  He may proceed w/ CDL from cardiology standpoint.

## 2019-02-04 NOTE — Telephone Encounter (Signed)
Incoming call from patient. Reviewed results of gxt, I provided him with our fax number to send [employer] form. Pt reported he would be speaking with them later this week and send over forms.   Advised pt to call for any further questions or concerns.

## 2019-02-04 NOTE — Telephone Encounter (Signed)
Attempted to call patient. [facility] 02/04/2019

## 2019-02-06 ENCOUNTER — Other Ambulatory Visit: Payer: Self-pay

## 2019-02-06 ENCOUNTER — Encounter: Payer: Self-pay | Admitting: Family Medicine

## 2019-02-06 ENCOUNTER — Ambulatory Visit (INDEPENDENT_AMBULATORY_CARE_PROVIDER_SITE_OTHER): Payer: Medicare Other | Admitting: Family Medicine

## 2019-02-06 VITALS — BP 144/96 | HR 80 | Temp 98.0°F | Ht 72.0 in | Wt 255.4 lb

## 2019-02-06 DIAGNOSIS — E875 Hyperkalemia: Secondary | ICD-10-CM | POA: Diagnosis not present

## 2019-02-06 DIAGNOSIS — M19042 Primary osteoarthritis, left hand: Secondary | ICD-10-CM | POA: Diagnosis not present

## 2019-02-06 DIAGNOSIS — M159 Polyosteoarthritis, unspecified: Secondary | ICD-10-CM | POA: Insufficient documentation

## 2019-02-06 DIAGNOSIS — M8949 Other hypertrophic osteoarthropathy, multiple sites: Secondary | ICD-10-CM | POA: Diagnosis not present

## 2019-02-06 DIAGNOSIS — M48061 Spinal stenosis, lumbar region without neurogenic claudication: Secondary | ICD-10-CM | POA: Insufficient documentation

## 2019-02-06 DIAGNOSIS — M19041 Primary osteoarthritis, right hand: Secondary | ICD-10-CM | POA: Diagnosis not present

## 2019-02-06 MED ORDER — DICLOFENAC SODIUM 1 % EX GEL: 2.0000 g | Freq: Three times a day (TID) | CUTANEOUS | 2 refills | Status: DC | PRN

## 2019-02-06 MED ORDER — PREDNISONE 20 MG PO TABS: ORAL_TABLET | ORAL | 0 refills | Status: DC

## 2019-02-06 MED ORDER — DICLOFENAC SODIUM 75 MG PO TBEC: 75.0000 mg | DELAYED_RELEASE_TABLET | Freq: Two times a day (BID) | ORAL | 1 refills | Status: DC

## 2019-02-06 NOTE — Patient Instructions (Addendum)
Thank you for coming to the office today.  Ordered prednisone taper over 7 days for pain relief and anti inflammatory.  HOLD Diclofenac pill for now. Start after prednisone.  Try topical diclofenac on hands for now  Follow up with [facility] as planned.  DUE for NON FASTING BLOOD WORK (no food or drink after midnight before the lab appointment, only water or coffee without cream/sugar on the morning of)  SCHEDULE "Lab Only" visit in the morning at the clinic for lab draw in 1 WEEK  - Make sure Lab Only appointment is at about 1 week before your next appointment, so that results will be available  For Lab Results, once available within 2-3 days of blood draw, you can can log in to MyChart online to view your results and a brief explanation. Also, we can discuss results at next follow-up visit.   Please schedule a Follow-up Appointment to: Return if symptoms worsen or fail to improve.  If you have any other questions or concerns, please feel free to call the office or send a message through [place]. You may also schedule an earlier appointment if necessary.  Additionally, you may be receiving a survey about your experience at our office within a few days to 1 week by e-mail or mail. We value your feedback.  Nobie Putnam, DO [place]

## 2019-02-06 NOTE — Progress Notes (Signed)
Subjective:    Patient ID: Robert Greer, male    DOB: 10/15/1955, 64 y.o.   MRN: [id number]  Robert Greer is a 64 y.o. male presenting on 02/06/2019 for Hand Pain (bilateral hand pain x 3 mth. Mostly in the morning that causes difficulty with bending your finger.) and Joint Pain (chronic joint pain. The pt also would like to discuss his recent discharge from Pain Management, because the urine drug screen did not contain the right amount of prescribed opioid. He currently has an upcoming appt scheduled with  [facility]. )  Previous PCP Cassell Smiles, AGPCNP-BC  HPI   Chronic Low Back Pain / Lumbar Spinal Stenosis Osteoarthritis Multiple joints / Bilateral Hand Pain  Previous Neurosurgery Dr Cecelia Byars [facility] at [place], who retired, and then he transitioned to [facility]. - He has long history on Oxycodone for >20 years. He had a urine sample without enough of the oxycodone in his urine, he was discharged from pain clinic. - Taking Duloxetine 15m BID - Awaiting for upcoming apt with [facility] Dr JEra Bumpersin DCheshirenext week - Currently has prior back spinal surgeries / prior back fusion L4-L5  He describes bilateral hand pain and stiffness, with soreness. Arthritis in multiple joints. He is asking about medication for this. Was on diclofenac 559mBID PRN and is out of this now. Not tried topical treatment.   CAD s/p stents Followed by Dr GoRockey Situe needed DOT physical recently, and they required Cardiology clearance. This week he did a stress test from Dr GoRockey SituHe had elevated K, 5.4 on lab, he was asked to repeat blood test, asking if he can come here to get lab drawn.  History of Hemochromatosis Prior elevated Hgb, recent labs were normal. He request an upcoming blood test.   [facility] 09/11/2018 08/21/2018 07/15/2018  Decreased Interest 0 0 0  Down, Depressed, Hopeless 0 1 0  PHQ - 2 Score 0 1 0  Altered sleeping 0 0 0  Tired,  decreased energy 0 1 1  Change in appetite 0 0 0  Feeling bad or failure about yourself  0 0 0  Trouble concentrating 0 0 0  Moving slowly or fidgety/restless 0 0 0  Suicidal thoughts 0 - 0  PHQ-9 Score 0 2 1  Difficult doing work/chores Not difficult at all Not difficult at all Not difficult at all    Social History   Tobacco Use  . Smoking status: Never Smoker  . Smokeless tobacco: Current User    Types: Snuff  Substance Use Topics  . Alcohol use: Yes    Alcohol/week: 2.0 - 3.0 standard drinks    Types: 2 - 3 Cans of beer per week    Comment: ocassionally  . Drug use: No    Review of Systems Per HPI unless specifically indicated above     Objective:    BP (!) 144/96 (BP Location: Left Arm, Patient Position: Sitting, Cuff Size: Large)   Pulse 80   Temp 98 F (36.7 C) (Oral)   Ht 6' (1.829 m)   Wt 255 lb 6.4 oz (115.8 kg)   BMI 34.64 kg/m   Wt Readings from Last 3 Encounters:  02/06/19 255 lb 6.4 oz (115.8 kg)  01/22/19 252 lb 8 oz (114.5 kg)  09/11/18 244 lb 12.8 oz (111 kg)    Physical Exam Vitals and nursing note reviewed.  Constitutional:      General: He is not  in acute distress.    Appearance: He is well-developed. He is not diaphoretic.     Comments: Well-appearing, comfortable, cooperative  HENT:     Head: Normocephalic and atraumatic.  Eyes:     General:        Right eye: No discharge.        Left eye: No discharge.     Conjunctiva/sclera: Conjunctivae normal.  Cardiovascular:     Rate and Rhythm: Normal rate.  Pulmonary:     Effort: Pulmonary effort is normal.  Musculoskeletal:     Comments: Bilateral hands with bulky appearance and DIP nodules consistent with OA. Stiffness on movement.  Reduced range of motion with back flex/ext.  Skin:    General: Skin is warm and dry.     Findings: No erythema or rash.  Neurological:     Mental Status: He is alert and oriented to person, place, and time.  Psychiatric:        Behavior: Behavior normal.      Comments: Well groomed, good eye contact, normal speech and thoughts       Results for orders placed or performed in visit on 02/02/19  Comp Met (CMET)  Result Value Ref Range   Glucose 96 65 - 99 mg/dL   BUN 18 8 - 27 mg/dL   Creatinine, Ser 1.07 0.76 - 1.27 mg/dL   GFR calc non Af Amer 72 >59 mL/min/1.73   GFR calc Af Amer 84 >59 mL/min/1.73   BUN/Creatinine Ratio 17 10 - 24   Sodium 141 134 - 144 mmol/L   Potassium 5.4 (H) 3.5 - 5.2 mmol/L   Chloride 104 96 - 106 mmol/L   CO2 25 20 - 29 mmol/L   Calcium 9.2 8.6 - 10.2 mg/dL   Total Protein 7.1 6.0 - 8.5 g/dL   Albumin 4.8 3.8 - 4.8 g/dL   Globulin, Total 2.3 1.5 - 4.5 g/dL   Albumin/Globulin Ratio 2.1 1.2 - 2.2   Bilirubin Total 0.5 0.0 - 1.2 mg/dL   Alkaline Phosphatase 81 39 - 117 IU/L   AST 22 0 - 40 IU/L   ALT 30 0 - 44 IU/L  Lipid Profile  Result Value Ref Range   Cholesterol, Total 149 100 - 199 mg/dL   Triglycerides 93 0 - 149 mg/dL   HDL 67 >39 mg/dL   VLDL Cholesterol Cal 17 5 - 40 mg/dL   LDL Chol Calc (NIH) 65 0 - 99 mg/dL   Chol/HDL Ratio 2.2 0.0 - 5.0 ratio      Assessment & Plan:   Problem List Items Addressed This Visit    Spinal stenosis of lumbar region without neurogenic claudication   Relevant Medications   predniSONE (DELTASONE) 20 MG tablet   diclofenac (VOLTAREN) 75 MG EC tablet   Primary osteoarthritis of both hands - Primary   Relevant Medications   diclofenac Sodium (VOLTAREN) 1 % GEL   predniSONE (DELTASONE) 20 MG tablet   diclofenac (VOLTAREN) 75 MG EC tablet   Primary osteoarthritis involving multiple joints   Relevant Medications   predniSONE (DELTASONE) 20 MG tablet   diclofenac (VOLTAREN) 75 MG EC tablet      #Chronic pain / Osteoarthritis multiple joints / lumbar OA DJD spinal stenosis Reviewed background history Patient has long standing history of oxycodone use for chronic pain, he was followed by [facility], for prior surgeries and pain medicine, he  was discharged recently by them due to reported not high enough level of oxycodone  in his UDS. He takes med 1x daily or PRN at times. - He is s/p prior spinal fusion back surgery and he is arranging for future evaluation by [facility] upcoming in a week  Advised that I cannot do chronic pain management, and can offer to re order / increase Diclofenac for him, 40m BID oral, and can also add the diclofenac topical PRN for his hand OA/DJD  #Hyperkalemia Check lab BMET within 1 week, forward to his cardiologist for review  #History of hemochromatosis, prior lab was normal in 2020, will check CBC at his request with upcoming blood draw   Meds ordered this encounter  Medications  . diclofenac Sodium (VOLTAREN) 1 % GEL    Sig: Apply 2 g topically 3 (three) times daily as needed (osteoarthritis hands).    Dispense:  100 g    Refill:  2  . predniSONE (DELTASONE) 20 MG tablet    Sig: Take daily with food. Start with 645m(3 pills) x 2 days, then reduce to 4061m2 pills) x 2 days, then 74m70m pill) x 3 days    Dispense:  13 tablet    Refill:  0  . diclofenac (VOLTAREN) 75 MG EC tablet    Sig: Take 1 tablet (75 mg total) by mouth 2 (two) times daily.    Dispense:  60 tablet    Refill:  1      Follow up plan: Return if symptoms worsen or fail to improve.   BMET for potassium follow up and CBC for blood counts will forward BMET to Cardiology as requested, due to easier for him to get to our office to draw this lab.   AlexNobie Putnam [facility] 02/06/2019, 3:17 PM

## 2019-02-12 NOTE — Telephone Encounter (Signed)
Attempted to call patient. LMTCB 02/12/2019  Left detailed message that I have faxed GXT results to number provided.   Advised pt to call for any further questions or concerns.

## 2019-02-12 NOTE — Telephone Encounter (Signed)
Patient spouse calling  States [facility] needs copy of ETT for DOT physical  States fax is [phone] Attempted to call Urgent Care to verify information, no answer and no VM Please advise

## 2019-02-13 ENCOUNTER — Other Ambulatory Visit: Payer: Medicare Other

## 2019-02-13 ENCOUNTER — Other Ambulatory Visit: Payer: Self-pay | Admitting: Neurological Surgery

## 2019-02-13 ENCOUNTER — Other Ambulatory Visit: Payer: Self-pay

## 2019-02-13 DIAGNOSIS — M5440 Lumbago with sciatica, unspecified side: Secondary | ICD-10-CM

## 2019-02-13 DIAGNOSIS — M5442 Lumbago with sciatica, left side: Secondary | ICD-10-CM | POA: Diagnosis not present

## 2019-02-13 DIAGNOSIS — E875 Hyperkalemia: Secondary | ICD-10-CM

## 2019-02-13 DIAGNOSIS — G8929 Other chronic pain: Secondary | ICD-10-CM | POA: Diagnosis not present

## 2019-02-13 DIAGNOSIS — M5441 Lumbago with sciatica, right side: Secondary | ICD-10-CM | POA: Diagnosis not present

## 2019-02-13 DIAGNOSIS — Z981 Arthrodesis status: Secondary | ICD-10-CM

## 2019-02-13 LAB — BASIC METABOLIC PANEL WITH GFR
BUN: 15 mg/dL (ref 7–25)
CO2: 31 mmol/L (ref 20–32)
Calcium: 9.3 mg/dL (ref 8.6–10.3)
Chloride: 103 mmol/L (ref 98–110)
Creat: 1.18 mg/dL (ref 0.70–1.25)
GFR, Est African American: 75 mL/min/{1.73_m2} (ref >=60)
GFR, Est Non African American: 64 mL/min/{1.73_m2} (ref >=60)
Glucose, Bld: 74 mg/dL (ref 65–139)
Potassium: 4.1 mmol/L (ref 3.5–5.3)
Sodium: 139 mmol/L (ref 135–146)

## 2019-02-13 LAB — CBC WITH DIFFERENTIAL/PLATELET
Absolute Monocytes: 745 cells/uL (ref 200–950)
Basophils Absolute: 41 cells/uL (ref 0–200)
Basophils Relative: 0.6 %
Eosinophils Absolute: 290 cells/uL (ref 15–500)
Eosinophils Relative: 4.2 %
HCT: 45 % (ref 38.5–50.0)
Hemoglobin: 15.1 g/dL (ref 13.2–17.1)
Lymphs Abs: 2022 cells/uL (ref 850–3900)
MCH: 30.9 pg (ref 27.0–33.0)
MCHC: 33.6 g/dL (ref 32.0–36.0)
MCV: 92.2 fL (ref 80.0–100.0)
MPV: 9.3 fL (ref 7.5–12.5)
Monocytes Relative: 10.8 %
Neutro Abs: 3802 cells/uL (ref 1500–7800)
Neutrophils Relative %: 55.1 %
Platelets: 300 10*3/uL (ref 140–400)
RBC: 4.88 10*6/uL (ref 4.20–5.80)
RDW: 12.1 % (ref 11.0–15.0)
Total Lymphocyte: 29.3 %
WBC: 6.9 10*3/uL (ref 3.8–10.8)

## 2019-02-25 DIAGNOSIS — M5416 Radiculopathy, lumbar region: Secondary | ICD-10-CM | POA: Diagnosis not present

## 2019-02-25 DIAGNOSIS — Z79891 Long term (current) use of opiate analgesic: Secondary | ICD-10-CM | POA: Diagnosis not present

## 2019-02-25 DIAGNOSIS — M2550 Pain in unspecified joint: Secondary | ICD-10-CM | POA: Diagnosis not present

## 2019-02-25 DIAGNOSIS — Z5181 Encounter for therapeutic drug level monitoring: Secondary | ICD-10-CM | POA: Diagnosis not present

## 2019-02-25 DIAGNOSIS — M961 Postlaminectomy syndrome, not elsewhere classified: Secondary | ICD-10-CM | POA: Diagnosis not present

## 2019-03-02 ENCOUNTER — Other Ambulatory Visit: Payer: Self-pay | Admitting: Nurse Practitioner

## 2019-03-02 DIAGNOSIS — M10071 Idiopathic gout, right ankle and foot: Secondary | ICD-10-CM

## 2019-03-20 DIAGNOSIS — Z23 Encounter for immunization: Secondary | ICD-10-CM | POA: Diagnosis not present

## 2019-04-03 DIAGNOSIS — Z79891 Long term (current) use of opiate analgesic: Secondary | ICD-10-CM | POA: Diagnosis not present

## 2019-04-03 DIAGNOSIS — M5416 Radiculopathy, lumbar region: Secondary | ICD-10-CM | POA: Diagnosis not present

## 2019-04-03 DIAGNOSIS — M961 Postlaminectomy syndrome, not elsewhere classified: Secondary | ICD-10-CM | POA: Diagnosis not present

## 2019-04-18 ENCOUNTER — Other Ambulatory Visit: Payer: Self-pay | Admitting: Nurse Practitioner

## 2019-04-18 DIAGNOSIS — F419 Anxiety disorder, unspecified: Secondary | ICD-10-CM

## 2019-04-20 NOTE — Telephone Encounter (Signed)
This medication was written by Ander Purpura in February 2020 for a 3 month supply with 1 refill.  He should have been out of this medication 6+ month ago.  Can we have him scheduled for a follow up appointment and I can send in a short Rx to cover until the visit?

## 2019-04-21 ENCOUNTER — Telehealth: Payer: Self-pay | Admitting: Nurse Practitioner

## 2019-04-21 DIAGNOSIS — F4323 Adjustment disorder with mixed anxiety and depressed mood: Secondary | ICD-10-CM | POA: Diagnosis not present

## 2019-04-21 NOTE — Telephone Encounter (Signed)
Pt. Is requesting refill on duloxetine 60 MG

## 2019-04-21 NOTE — Telephone Encounter (Signed)
Attempted to contact the patient, no answer. LMOM to return my call.

## 2019-04-22 ENCOUNTER — Other Ambulatory Visit: Payer: Self-pay

## 2019-04-22 ENCOUNTER — Encounter: Payer: Self-pay | Admitting: Family Medicine

## 2019-04-22 ENCOUNTER — Ambulatory Visit (INDEPENDENT_AMBULATORY_CARE_PROVIDER_SITE_OTHER): Payer: Medicare Other | Admitting: Family Medicine

## 2019-04-22 DIAGNOSIS — F419 Anxiety disorder, unspecified: Secondary | ICD-10-CM

## 2019-04-22 MED ORDER — DULOXETINE HCL 60 MG PO CPEP: 60.0000 mg | ORAL_CAPSULE | Freq: Two times a day (BID) | ORAL | 1 refills | Status: DC

## 2019-04-22 NOTE — Progress Notes (Signed)
Virtual Visit via Telephone  The purpose of this virtual visit is to provide medical care while limiting exposure to the novel coronavirus (COVID19) for both patient and office staff.  Consent was obtained for phone visit:  Yes.   Answered questions that patient had about telehealth interaction:  Yes.   I discussed the limitations, risks, security and privacy concerns of performing an evaluation and management service by telephone. I also discussed with the patient that there may be a patient responsible charge related to this service. The patient expressed understanding and agreed to proceed.  Patient is at home and is accessed via telephone Services are provided by Harlin Rain, FNP-C from [facility])  ---------------------------------------------------------------------- Chief Complaint  Patient presents with  . Anxiety    pt requesting a refill on his Cymbalta. He admits that he was only taking it once a [place] instead of twice a [place] for awhile.  He just recently went back to taking it twice a [place], because he felt like  anxiety.     S: Reviewed CMA documentation. I have called patient and gathered additional HPI as follows:  Mr. Schiro presents via telemedicine visit for refill on his Cymbalta 78m.  States he has been prescribed it to take as 667mBID back in February 2020 and had gone down to taking it once daily but since his anxiety has increased, he has started back to 6030mID.  Reports doing well without any additional concerns, just requesting refill.  Patient is currently working  Denies any high risk travel to areas of current concern for [place] any known or suspected exposure to person with or possibly with COVID19.  Denies any fevers, chills, sweats, body ache, cough, shortness of breath, sinus pain or pressure, headache, abdominal pain, diarrhea, increased anxiety/depression, SI/HI  Past Medical History:  Diagnosis Date  . Anxiety   .  Arthritis   . Backache, unspecified   . CAD (coronary artery disease)    a. 12/2014 NSTEMI/PCI: LM nl, LAD 85p/m (2.5x22 Resolute Integrity DES), D1 50ost, D2/3 small/nl, LCX 60d, OM1 nl, OM2 min irregs, OM3 nl, RCA 100m48mkely culprilt w/ L->R, R->R collats-->Med Rx). Nl EF.  . DeMar[place] Kitchenressive disorder, not elsewhere classified   . Gout   . History of echocardiogram    a. 12/2014 Echo: EF 60-65%, no rwma, mild MR. Nl RV fxn.  . Hyperlipidemia   . Hypertension   . Lipoma of back 02/09/2015  . Measles   . Mobitz (type) II atrioventricular block   . Mumps   . Other hemochromatosis 12/05/2016  . Sleep apnea    Social History   Tobacco Use  . Smoking status: Never Smoker  . Smokeless tobacco: Current User    Types: Snuff  Substance Use Topics  . Alcohol use: Yes    Alcohol/week: 2.0 - 3.0 standard drinks    Types: 2 - 3 Cans of beer per week    Comment: ocassionally  . Drug use: No    Current Outpatient Medications:  .  allopurinol (ZYLOPRIM) 100 MG tablet, Take 1 tablet (100 mg total) by mouth daily., Disp: 90 tablet, Rfl: 0 .  aspirin 81 MG chewable tablet, Chew 1 tablet (81 mg total) by mouth daily., Disp: 30 tablet, Rfl: 3 .  atorvastatin (LIPITOR) 20 MG tablet, Take 1 tablet (20 mg total) by mouth daily., Disp: 90 tablet, Rfl: 3 .  cetirizine (ZYRTEC) 10 MG tablet, Take 10 mg by mouth as needed for allergies.  Reported on 02/09/2015, Disp: , Rfl:  .  clopidogrel (PLAVIX) 75 MG tablet, Take 1 tablet (75 mg total) by mouth daily., Disp: 90 tablet, Rfl: 3 .  colchicine 0.6 MG tablet, Take 2 tabs (1.2 mg) once dose at start of gout flare.  Then take 1 tab (0.6 mg) every 12 hours until end of gout flare or max of 14 days., Disp: 30 tablet, Rfl: 1 .  diclofenac (VOLTAREN) 75 MG EC tablet, Take 1 tablet (75 mg total) by mouth 2 (two) times daily., Disp: 60 tablet, Rfl: 1 .  diclofenac Sodium (VOLTAREN) 1 % GEL, Apply 2 g topically 3 (three) times daily as needed (osteoarthritis hands).,  Disp: 100 g, Rfl: 2 .  DULoxetine (CYMBALTA) 60 MG capsule, Take 1 capsule (60 mg total) by mouth 2 (two) times daily., Disp: 180 capsule, Rfl: 1 .  ezetimibe (ZETIA) 10 MG tablet, Take 1 tablet (10 mg total) by mouth daily., Disp: 90 tablet, Rfl: 3 .  losartan (COZAAR) 50 MG tablet, Take 1 tablet (50 mg total) by mouth daily., Disp: 90 tablet, Rfl: 3 .  oxyCODONE-acetaminophen (PERCOCET) 10-325 MG tablet, Take 1 tablet by mouth daily as needed for pain. , Disp: , Rfl:  .  hydrOXYzine (ATARAX/VISTARIL) 25 MG tablet, Take 0.5-1 tablets (12.5-25 mg total) by mouth EVERY 8 HOURS as needed for anxiety. NEED APPT (Patient not taking: Reported on 02/06/2019), Disp: 30 tablet, Rfl: 0 .  triamcinolone (NASACORT) 55 MCG/ACT AERO nasal inhaler, Place 2 sprays into the nose daily. (Patient not taking: Reported on 04/22/2019), Disp: 1 Inhaler, Rfl: 12  Depression screen [facility] 2/9 04/22/2019 09/11/2018 08/21/2018  Decreased Interest 0 0 0  Down, Depressed, Hopeless 0 0 1  PHQ - 2 Score 0 0 1  Altered sleeping 0 0 0  Tired, decreased energy 0 0 1  Change in appetite 0 0 0  Feeling bad or failure about yourself  0 0 0  Trouble concentrating 0 0 0  Moving slowly or fidgety/restless 0 0 0  Suicidal thoughts 0 0 -  PHQ-9 Score 0 0 2  Difficult doing work/chores Not difficult at all Not difficult at all Not difficult at all  Some recent data might be hidden    GAD 7 : Generalized Anxiety Score 04/22/2019 09/11/2018 07/15/2018 03/11/2018  Nervous, Anxious, on Edge 0 1 1 0  Control/stop worrying 0 0 1 0  Worry too much - different things 0 0 1 0  Trouble relaxing 0 0 1 0  Restless 0 0 1 0  Easily annoyed or irritable _0 0  Afraid - awful might happen 0 0 1 0  Total GAD 7 Score _1 0  Anxiety Difficulty Not difficult at all Somewhat difficult Not difficult at all Not difficult at all    -------------------------------------------------------------------------- O: No physical exam performed due to remote  telephone encounter.  Physical Exam: Patient remotely monitored without video.  Verbal communication appropriate.  Cognition normal.   Recent Results (from the past 2160 hour(s))  SARS CORONAVIRUS 2 (TAT 6-24 HRS) Nasopharyngeal Nasopharyngeal Swab     Status: None   Collection Time: 01/30/19 12:17 PM   Specimen: Nasopharyngeal Swab  Result Value Ref Range   SARS Coronavirus 2 NEGATIVE NEGATIVE    Comment: (NOTE) SARS-CoV-2 target nucleic acids are NOT DETECTED. The SARS-CoV-2 RNA is generally detectable in upper and lower respiratory specimens during the acute phase of infection. Negative results do not preclude SARS-CoV-2 infection, do not rule out co-infections  with other pathogens, and should not be used as the sole basis for treatment or other patient management decisions. Negative results must be combined with clinical observations, patient history, and epidemiological information. The expected result is Negative. Fact Sheet for Patients: SugarRoll.be Fact Sheet for Healthcare Providers: https://www.woods-mathews.com/ This test is not yet approved or cleared by the [place] FDA and  has been authorized for detection and/or diagnosis of SARS-CoV-2 by FDA under an Emergency Use Authorization (EUA). This EUA will remain  in effect (meaning this test can be used) for the duration of the COVID-19 declaration under Section 56 4(b)(1) of the Act, 21 U.S.C. section 360bbb-3(b)(1), unless the authorization is terminated or revoked sooner. Performed at [facility], [place] [address] [place], [place] [address]   Comp Met (CMET)     Status: Abnormal   Collection Time: 02/02/19  8:34 AM  Result Value Ref Range   Glucose 96 65 - 99 mg/dL   BUN 18 8 - 27 mg/dL   Creatinine, Ser 1.07 0.76 - 1.27 mg/dL   GFR calc non Af Amer 72 >59 mL/min/1.73   GFR calc Af Amer 84 >59 mL/min/1.73   BUN/Creatinine Ratio 17 10 - 24   Sodium 141 134 -  144 mmol/L   Potassium 5.4 (H) 3.5 - 5.2 mmol/L   Chloride 104 96 - 106 mmol/L   CO2 25 20 - 29 mmol/L   Calcium 9.2 8.6 - 10.2 mg/dL   Total Protein 7.1 6.0 - 8.5 g/dL   Albumin 4.8 3.8 - 4.8 g/dL   Globulin, Total 2.3 1.5 - 4.5 g/dL   Albumin/Globulin Ratio 2.1 1.2 - 2.2   Bilirubin Total 0.5 0.0 - 1.2 mg/dL   Alkaline Phosphatase 81 39 - 117 IU/L   AST 22 0 - 40 IU/L   ALT 30 0 - 44 IU/L  Lipid Profile     Status: None   Collection Time: 02/02/19  8:34 AM  Result Value Ref Range   Cholesterol, Total 149 100 - 199 mg/dL   Triglycerides 93 0 - 149 mg/dL   HDL 67 >39 mg/dL   VLDL Cholesterol Cal 17 5 - 40 mg/dL   LDL Chol Calc (NIH) 65 0 - 99 mg/dL   Chol/HDL Ratio 2.2 0.0 - 5.0 ratio    Comment:                                   T. Chol/HDL Ratio                                             Men  Women                               1/2 Avg.Risk  3.4    3.3                                   Avg.Risk  5.0    4.4                                2X Avg.Risk  9.6    7.1  3X Avg.Risk 23.4   11.0   Exercise Tolerance Test     Status: None   Collection Time: 02/02/19  9:46 AM  Result Value Ref Range   Rest HR 71 bpm   Rest BP 155/96 mmHg   RPE 13    Exercise duration (sec) 21 sec   Percent HR 90 %   Exercise duration (min) 7 min   Estimated workload 9.0 METS   Peak HR 141 bpm   Peak BP 224/95 mmHg   MPHR 155 bpm  [facility] - CBC with Differential/Platelet physical     Status: None   Collection Time: 02/13/19 10:12 AM  Result Value Ref Range   WBC 6.9 3.8 - 10.8 Thousand/uL   RBC 4.88 4.20 - 5.80 Million/uL   Hemoglobin 15.1 13.2 - 17.1 g/dL   HCT 45.0 38.5 - 50.0 %   MCV 92.2 80.0 - 100.0 fL   MCH 30.9 27.0 - 33.0 pg   MCHC 33.6 32.0 - 36.0 g/dL   RDW 12.1 11.0 - 15.0 %   Platelets 300 140 - 400 Thousand/uL   MPV 9.3 7.5 - 12.5 fL   Neutro Abs 3,802 1,500 - 7,800 cells/uL   Lymphs Abs 2,022 850 - 3,900 cells/uL   Absolute Monocytes 745 200 - 950  cells/uL   Eosinophils Absolute 290 15 - 500 cells/uL   Basophils Absolute 41 0 - 200 cells/uL   Neutrophils Relative % 55.1 %   Total Lymphocyte 29.3 %   Monocytes Relative 10.8 %   Eosinophils Relative 4.2 %   Basophils Relative 0.6 %  [facility] - BMET w/ GFR BMP Basic Metabolic Panel     Status: None   Collection Time: 02/13/19 10:12 AM  Result Value Ref Range   Glucose, Bld 74 65 - 139 mg/dL    Comment: .        Non-fasting reference interval .    BUN 15 7 - 25 mg/dL   Creat 1.18 0.70 - 1.25 mg/dL    Comment: For patients >17 years of age, the reference limit for Creatinine is approximately 13% higher for people identified as African-American. .    GFR, Est Non African American 64 > OR = 60 mL/min/1.90m   GFR, Est African American 75 > OR = 60 mL/min/1.727m  BUN/Creatinine Ratio NOT APPLICABLE 6 - 22 (calc)   Sodium 139 135 - 146 mmol/L   Potassium 4.1 3.5 - 5.3 mmol/L   Chloride 103 98 - 110 mmol/L   CO2 31 20 - 32 mmol/L   Calcium 9.3 8.6 - 10.3 mg/dL    -------------------------------------------------------------------------- A&P:  Problem List Items Addressed This Visit      Other   Anxiety    Reports had started Duloxetine 6057mID in early 2020, had been taking it as directed and then started to take it once per [place].  Reports has had increased anxiety lately so has started to take it twice a [place] again with symptom relief, but requesting refill.  Will send in refill as patient has tolerated treatment well in the past.  Will follow up again in June/July and can discuss what medication could potentially switch to as the upper limit of this medication is typically 73m1mce per [place]stein.      Relevant Medications   DULoxetine (CYMBALTA) 60 MG capsule      Meds ordered this encounter  Medications  . DULoxetine (CYMBALTA) 60 MG capsule    Sig: Take 1 capsule (60 mg total) by  mouth 2 (two) times daily.    Dispense:  180 capsule    Refill:  1    Follow-up: -  Return in June/July 2021 for follow up on hypertension and anxiety  Patient verbalizes understanding with the above medical recommendations including the limitation of remote medical advice.  Specific follow-up and call-back criteria were given for patient to follow-up or seek medical care more urgently if needed.   - Time spent in direct consultation with patient on phone: 5 minutes  Harlin Rain, [facility] 04/23/2019, 11:42 AM

## 2019-04-22 NOTE — Patient Instructions (Signed)
As we discussed, will refill your Duloxetine 60mg  twice daily for anxiety.  We will plan to see you back in clinic in June/July 2021 for follow up on your hypertension and anxiety.  Try to get exercise a minimum of 30 minutes per [place] at least 5 days per week as well as  adequate water intake all while measuring blood pressure a few times per week.  Keep a blood pressure log and bring back to clinic at your next visit.  If your readings are consistently over 140/90 to contact our office/send me a MyChart message and we will see you sooner.  Can try DASH and Mediterranean diet options, avoiding processed foods, lowering sodium intake, avoiding pork products, and eating a plant based diet for optimal health.  The following recommendations are helpful adjuncts for helping rebalance your mood.  Eat a nourishing diet. Ensure adequate intake of calories, protein, carbs, fat, vitamins, and minerals. Prioritize whole foods at each meal, including meats, vegetables, fruits, nuts and seeds, etc.   Avoid inflammatory and/or "junk" foods, such as sugar, omega-6 fats, refined grains, chemicals, and preservatives are common in packaged and prepared foods. Minimize or completely avoid these ingredients and stick to whole foods with little to no additives. Cook from scratch as much as possible for more control over what you eat  Get enough sleep. Poor sleep is significantly associated with depression and anxiety. Make 7-9 hours of sleep nightly a top priority  Exercise appropriately. Exercise is known to improve brain functioning and boost mood. Aim for 30 minutes of daily physical activity. Avoid "overtraining," which can cause mental disturbances  Assess your light exposure. Not enough natural light during the [place] and too much artificial light can have a major impact on your mood. Get outside as often as possible during daylight hours. Minimize light exposure after dark and avoid the use of electronics that give  off blue light before bed  Manage your stress.  Use daily stress management techniques such as meditation, yoga, or mindfulness to retrain your brain to respond differently to stress. Try deep breathing to deactivate your "fight or flight" response.  There are many of sources with apps like Headspace, Calm or a variety of YouTube videos (videos from Gwynne Edinger have guided meditation)  Prioritize your social life. Work on building social support with new friends or improve current relationships. Consider getting a pet that allows for companionship, social interaction, and physical touch. Try volunteering or joining a faith-based community to increase your sense of purpose  4-7-8 breathing technique at bedtime: breathe in to count of 4, hold breath for count of 7, exhale for count of 8; do 3-5 times for letting go of overactive thoughts  Take time to play Unstructured "play" time can help reduce anxiety and depression Options for play include music, games, sports, dance, art, etc.  Try to add daily omega 3 fatty acids, magnesium, B complex, and balanced amino acid supplements to help improve mood and anxiety.  You will receive a survey after today's visit either digitally by e-mail or paper by [employer]. Your experiences and feedback matter to [place].  Please respond so we know how we are doing as we provide care for you.  Call us with any questions/concerns/needs.  It is my goal to be available to you for your health concerns.  Thanks for choosing me to be a partner in your healthcare needs!  Harlin Rain, FNP-C Family Nurse Practitioner [facility]  [facility] Phone: [phone]

## 2019-04-23 DIAGNOSIS — Z23 Encounter for immunization: Secondary | ICD-10-CM | POA: Diagnosis not present

## 2019-04-23 NOTE — Assessment & Plan Note (Signed)
Reports had started Duloxetine 60mg  BID in early 2020, had been taking it as directed and then started to take it once per [place].  Reports has had increased anxiety lately so has started to take it twice a [place] again with symptom relief, but requesting refill.  Will send in refill as patient has tolerated treatment well in the past.  Will follow up again in June/July and can discuss what medication could potentially switch to as the upper limit of this medication is typically 60mg  once per [place].

## 2019-05-06 NOTE — Telephone Encounter (Signed)
close encounter °

## 2019-05-08 ENCOUNTER — Other Ambulatory Visit: Payer: Self-pay | Admitting: Family Medicine

## 2019-05-08 DIAGNOSIS — M48061 Spinal stenosis, lumbar region without neurogenic claudication: Secondary | ICD-10-CM

## 2019-05-08 DIAGNOSIS — M159 Polyosteoarthritis, unspecified: Secondary | ICD-10-CM

## 2019-05-08 NOTE — Telephone Encounter (Signed)
Requested Prescriptions  Pending Prescriptions Disp Refills  . diclofenac (VOLTAREN) 75 MG EC tablet [Pharmacy Med Name: DICLOFENAC SOD DR 75 MG TAB] 180 tablet 2    Sig: Take 1 tablet (75 mg total) by mouth 2 (two) times daily.     Analgesics:  NSAIDS Passed - 05/08/2019 10:22 AM      Passed - Cr in normal range and within 360 days    Creat  Date Value Ref Range Status  02/13/2019 1.18 0.70 - 1.25 mg/dL Final    Comment:    For patients >66 years of age, the reference limit for Creatinine is approximately 13% higher for people identified as African-American. .          Passed - HGB in normal range and within 360 days    Hemoglobin  Date Value Ref Range Status  02/13/2019 15.1 13.2 - 17.1 g/dL Final         Passed - Patient is not pregnant      Passed - Valid encounter within last 12 months    Recent Outpatient Visits          2 weeks ago Parks, FNP   3 months ago Primary osteoarthritis of both hands   [place], DO   6 months ago Obstructive sleep apnea   [facility] Mikey College, NP   7 months ago [facility] Mikey College, NP   8 months ago Need for vaccination against Streptococcus pneumoniae using pneumococcal conjugate vaccine [facility] Merrilyn Puma, Jerrel Ivory, NP

## 2019-05-08 NOTE — Telephone Encounter (Signed)
Pt called in and stated he only has 3 and would like to know if this could be refilled today .   [place] call back number [phone]

## 2019-05-15 ENCOUNTER — Ambulatory Visit: Payer: Medicare Other | Admitting: Family Medicine

## 2019-05-29 DIAGNOSIS — M961 Postlaminectomy syndrome, not elsewhere classified: Secondary | ICD-10-CM | POA: Diagnosis not present

## 2019-05-29 DIAGNOSIS — M5416 Radiculopathy, lumbar region: Secondary | ICD-10-CM | POA: Diagnosis not present

## 2019-06-04 ENCOUNTER — Other Ambulatory Visit: Payer: Self-pay | Admitting: Nurse Practitioner

## 2019-06-04 DIAGNOSIS — M10071 Idiopathic gout, right ankle and foot: Secondary | ICD-10-CM

## 2019-06-05 DIAGNOSIS — G8929 Other chronic pain: Secondary | ICD-10-CM | POA: Diagnosis not present

## 2019-06-05 DIAGNOSIS — M961 Postlaminectomy syndrome, not elsewhere classified: Secondary | ICD-10-CM | POA: Diagnosis not present

## 2019-06-08 DIAGNOSIS — M961 Postlaminectomy syndrome, not elsewhere classified: Secondary | ICD-10-CM | POA: Diagnosis not present

## 2019-06-08 DIAGNOSIS — Z9689 Presence of other specified functional implants: Secondary | ICD-10-CM | POA: Diagnosis not present

## 2019-06-12 DIAGNOSIS — M5416 Radiculopathy, lumbar region: Secondary | ICD-10-CM | POA: Diagnosis not present

## 2019-06-12 DIAGNOSIS — I10 Essential (primary) hypertension: Secondary | ICD-10-CM | POA: Diagnosis not present

## 2019-06-12 DIAGNOSIS — J301 Allergic rhinitis due to pollen: Secondary | ICD-10-CM | POA: Diagnosis not present

## 2019-06-12 DIAGNOSIS — Z6835 Body mass index (BMI) 35.0-35.9, adult: Secondary | ICD-10-CM | POA: Diagnosis not present

## 2019-06-12 DIAGNOSIS — M48061 Spinal stenosis, lumbar region without neurogenic claudication: Secondary | ICD-10-CM | POA: Diagnosis not present

## 2019-06-12 DIAGNOSIS — M961 Postlaminectomy syndrome, not elsewhere classified: Secondary | ICD-10-CM | POA: Diagnosis not present

## 2019-06-12 DIAGNOSIS — M19011 Primary osteoarthritis, right shoulder: Secondary | ICD-10-CM | POA: Diagnosis not present

## 2019-07-17 ENCOUNTER — Other Ambulatory Visit: Payer: Self-pay

## 2019-07-17 ENCOUNTER — Encounter: Payer: Self-pay | Admitting: Family Medicine

## 2019-07-17 ENCOUNTER — Ambulatory Visit (INDEPENDENT_AMBULATORY_CARE_PROVIDER_SITE_OTHER): Payer: Medicare Other | Admitting: Family Medicine

## 2019-07-17 VITALS — BP 129/76 | HR 75 | Temp 97.1°F | Ht 72.0 in | Wt 263.6 lb

## 2019-07-17 DIAGNOSIS — E782 Mixed hyperlipidemia: Secondary | ICD-10-CM

## 2019-07-17 DIAGNOSIS — M159 Polyosteoarthritis, unspecified: Secondary | ICD-10-CM

## 2019-07-17 DIAGNOSIS — L089 Local infection of the skin and subcutaneous tissue, unspecified: Secondary | ICD-10-CM | POA: Insufficient documentation

## 2019-07-17 DIAGNOSIS — M48061 Spinal stenosis, lumbar region without neurogenic claudication: Secondary | ICD-10-CM | POA: Diagnosis not present

## 2019-07-17 DIAGNOSIS — M8949 Other hypertrophic osteoarthropathy, multiple sites: Secondary | ICD-10-CM | POA: Diagnosis not present

## 2019-07-17 DIAGNOSIS — M10071 Idiopathic gout, right ankle and foot: Secondary | ICD-10-CM

## 2019-07-17 DIAGNOSIS — F419 Anxiety disorder, unspecified: Secondary | ICD-10-CM

## 2019-07-17 DIAGNOSIS — L03115 Cellulitis of right lower limb: Secondary | ICD-10-CM

## 2019-07-17 LAB — POCT URINALYSIS DIPSTICK
Bilirubin, UA: NEGATIVE
Blood, UA: NEGATIVE
Glucose, UA: NEGATIVE
Ketones, UA: NEGATIVE
Leukocytes, UA: NEGATIVE
Nitrite, UA: NEGATIVE
Protein, UA: NEGATIVE
Spec Grav, UA: 1.015 (ref 1.010–1.025)
Urobilinogen, UA: 0.2 E.U./dL
pH, UA: 5 (ref 5.0–8.0)

## 2019-07-17 MED ORDER — CETIRIZINE HCL 10 MG PO TABS: 10.0000 mg | ORAL_TABLET | ORAL | 1 refills | Status: DC | PRN

## 2019-07-17 MED ORDER — DULOXETINE HCL 60 MG PO CPEP: 60.0000 mg | ORAL_CAPSULE | Freq: Two times a day (BID) | ORAL | 1 refills | Status: DC

## 2019-07-17 MED ORDER — ALLOPURINOL 100 MG PO TABS: 100.0000 mg | ORAL_TABLET | Freq: Every day | ORAL | 0 refills | Status: DC

## 2019-07-17 MED ORDER — SULFAMETHOXAZOLE-TRIMETHOPRIM 800-160 MG PO TABS: 1.0000 | ORAL_TABLET | Freq: Two times a day (BID) | ORAL | 0 refills | Status: AC

## 2019-07-17 MED ORDER — DICLOFENAC SODIUM 75 MG PO TBEC: 75.0000 mg | DELAYED_RELEASE_TABLET | Freq: Two times a day (BID) | ORAL | 2 refills | Status: DC

## 2019-07-17 NOTE — Patient Instructions (Signed)
Your medication refills have been sent to your pharmacy on file.  I have put in a request for information from our pharmacist to see if there is a patient assistance program for the Zetia prescription that you are taking.  I have sent in a prescription for Bactrim DS to take 1 tablet 2x per Zani for the next 5 days.  If you are having worsening of symptoms or no symptom improvement for your right lower leg infection, please let me know and I will put in a referral to the wound clinic.  We will plan to see you back in 6 months for anxiety follow up  You will receive a survey after today's visit either digitally by e-mail or paper by [place] mail. Your experiences and feedback matter to [place].  Please respond so we know how we are doing as we provide care for you.  Call us with any questions/concerns/needs.  It is my goal to be available to you for your health concerns.  Thanks for choosing me to be a partner in your healthcare needs!  Harlin Rain, FNP-C Family Nurse Practitioner [facility] Phone: [phone]

## 2019-07-17 NOTE — Addendum Note (Signed)
Addended by: Wilson Singer on: 07/17/2019 04:30 PM   Modules accepted: Orders
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
## 2019-07-17 NOTE — Assessment & Plan Note (Signed)
Reports stable and well controlled with duloxetine 60mg  BID.  Will continue with current treatment plan.  Plan: 1. Continue duloxetine 60mg  BID daily 2. Follow up in 6 months
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
## 2019-07-20 ENCOUNTER — Ambulatory Visit: Payer: Self-pay | Admitting: Pharmacist

## 2019-07-20 DIAGNOSIS — E782 Mixed hyperlipidemia: Secondary | ICD-10-CM

## 2019-07-20 NOTE — Chronic Care Management (AMB) (Signed)
  Care Management   Follow Up Note   07/20/2019 Name: Robert Greer MRN: [id number] DOB: 06-08-1955  Referred by: Verl Bangs, FNP Reason for referral : Chronic Care Management (Initial Patient Outreach Call)   Robert Greer is a 64 y.o. year old male who is a primary care patient of Lorine Bears, Lupita Raider, FNP. The care management team was consulted for assistance with care management and care coordination needs.    Outreach to [employer] by phone today.  Review of patient status, including review of consultants reports, relevant laboratory and other test results, and collaboration with appropriate care team members and the patient's provider was performed as part of comprehensive patient evaluation and provision of chronic care management services.    SDOH (Social Determinants of Health) assessments performed: No See Care Plan activities for detailed interventions related to [facility])     Advanced Directives: See Care Plan and Vynca application for related entries.   Goals Addressed            This Visit's Progress   . PharmD - Medication Assistance       CARE PLAN ENTRY (see longitudinal plan of care for additional care plan information)  Current Barriers:  . Financial Barriers in complicated patient with multiple medical conditions including CAD, HTN, HLD, arthritis, sleep apnea, and anxiety  patient has [employer] Saver Plus PDP Part D plan and reports copay for Zetia is cost prohibitive at this time  Pharmacist Clinical Goal(s):  Marland Kitchen Over the next 30 days, patient will work with PharmD and providers to relieve medication access concerns  Interventions: . Counsel on Medicare Part D plan and medication assistance options . Zetia: Patient reports meeting income criteria for [place] [employer]ch[employer] Fund, which is currently Open o Provide patient with contact information for [employer] . Will collaborate with Care Guide to request  assistance with reaching out to patient to review enrollment information and schedule future appointment with patient.  o Patient unable to continue conversation today as he is at work. Bertram Savin care team collaboration (see longitudinal plan of care) .   Patient Self Care Activities:  . Patient will provide necessary portions of application   Initial goal documentation        Plan  The care management team will reach out to the patient again over the next 30 days.   Harlow Asa, PharmD, [place] [employer] [phone]

## 2019-07-20 NOTE — Patient Instructions (Signed)
Thank you allowing the Care Management Team to be a part of your care! It was a pleasure speaking with you today!     Care Management Team    Noreene Larsson RN, MSN, CCM Nurse Care Coordinator  [phone]   Harlow Asa PharmD  Clinical Pharmacist  [phone]   Eula Fried LCSW Clinical Social Worker [phone]   Visit Information  Goals Addressed            This Visit's Progress   . PharmD - Medication Assistance       CARE PLAN ENTRY (see longitudinal plan of care for additional care plan information)  Current Barriers:  . Financial Barriers in complicated patient with multiple medical conditions including CAD, HTN, HLD, arthritis, sleep apnea, and anxiety  patient has [employer] PDP Part D plan and reports copay for Zetia is cost prohibitive at this time  Pharmacist Clinical Goal(s):  Robert Greer Kitchen Over the next 30 days, patient will work with PharmD and providers to relieve medication access concerns  Interventions: . Counsel on Medicare Part D plan and medication assistance options . Zetia: Patient reports meeting income criteria for [name], which is currently Open o Provide patient with contact information for [employer] . Will collaborate with Care Guide to request assistance with reaching out to patient to review enrollment information and schedule future appointment with patient.  o Patient unable to continue conversation today as he is at work. Robert Greer care team collaboration (see longitudinal plan of care) .   Patient Self Care Activities:  . Patient will provide necessary portions of application   Initial goal documentation        Patient verbalizes understanding of instructions provided today.   The care management team will reach out to the patient again over the next 30 days.    Harlow Asa, PharmD, [place]  [employer] [phone]

## 2019-07-21 ENCOUNTER — Telehealth: Payer: Self-pay | Admitting: Family Medicine

## 2019-07-21 NOTE — Chronic Care Management (AMB) (Signed)
  Chronic Care Management   Note  07/21/2019 Name: [name] MRN: [id number] DOB: 06/27/1955  Robert Greer is a 64 y.o. year old male who is a primary care patient of Lorine Bears, Lupita Raider, FNP. I reached out to Robert Greer by phone today in response to a referral sent by Robert Greer's PCP, Malfi, Lupita Raider, FNP.     Robert Greer was given information about Chronic Care Management services today including:  1. CCM service includes personalized support from designated clinical staff supervised by his physician, including individualized plan of care and coordination with other care providers 2. 24/7 contact phone numbers for assistance for urgent and routine care needs. 3. Service will only be billed when office clinical staff spend 20 minutes or more in a month to coordinate care. 4. Only one practitioner may furnish and bill the service in a calendar month. 5. The patient may stop CCM services at any time (effective at the end of the month) by phone call to the office staff. 6. The patient will be responsible for cost sharing (co-pay) of up to 20% of the service fee (after annual deductible is met).  Patient agreed to services and verbal consent obtained.   Follow up plan: Telephone appointment with care management team member scheduled for: 08/17/2019.  [place], [place] [address] Direct Dial: [phone] [email or web address] Website: [email or web address]

## 2019-08-17 ENCOUNTER — Ambulatory Visit: Payer: Medicare Other | Admitting: Pharmacist

## 2019-08-17 DIAGNOSIS — E782 Mixed hyperlipidemia: Secondary | ICD-10-CM

## 2019-08-17 NOTE — Patient Instructions (Signed)
Thank you allowing the Chronic Care Management Team to be a part of your care! It was a pleasure speaking with you today!     CCM (Chronic Care Management) Team    Noreene Larsson RN, MSN, CCM Nurse Care Coordinator  [phone]   Harlow Asa PharmD  Clinical Pharmacist  [phone]   Eula Fried LCSW Clinical Social Worker [phone]  Visit Information  Goals Addressed            This Visit's Progress   . PharmD - Medication Assistance       CARE PLAN ENTRY (see longitudinal plan of care for additional care plan information)  Current Barriers:  . Financial Barriers in complicated patient with multiple medical conditions including CAD, HTN, HLD, arthritis, sleep apnea, and anxiety  patient has [employer] PDP Part D plan and reports copay for Zetia is cost prohibitive at this time  Pharmacist Clinical Goal(s):  Marland Kitchen Over the next 30 days, patient will work with PharmD and providers to relieve medication access concerns  Interventions: . Counseled on Medicare Part D plan and medication assistance options . Zetia: Patient reported meeting income criteria for [place] [employer]ch[employer] Fund, which is currently Open o Again provide patient with contact information for [employer] . Schedule appointment with patient to complete medication review and follow up regarding medication assistance  o Patient unable to continue conversation today as he is at work. Bertram Savin care team collaboration (see longitudinal plan of care)  Patient Self Care Activities:  . Patient will provide necessary portions of application   Please see past updates related to this goal by clicking on the "Past Updates" button in the selected goal         Patient verbalizes understanding of instructions provided today.   Telephone follow up appointment with care management team member scheduled for: 8/16 at 4 pm  Harlow Asa, PharmD, [place] [phone]

## 2019-08-17 NOTE — Chronic Care Management (AMB) (Signed)
Chronic Care Management   Follow Up Note   08/17/2019 Name: Johsua Shevlin Prevost MRN: [id number] DOB: 1955/07/12  Referred by: Verl Bangs, FNP Reason for referral : Chronic Care Management (Patient Phone Call)   Kawhi Diebold Majeed is a 64 y.o. year old male who is a primary care patient of Lorine Bears, Lupita Raider, FNP. The CCM team was consulted for assistance with chronic disease management and care coordination needs.    I reached out to Zerita Boers Hayden by phone today.   Review of patient status, including review of consultants reports, relevant laboratory and other test results, and collaboration with appropriate care team members and the patient's provider was performed as part of comprehensive patient evaluation and provision of chronic care management services.     Outpatient Encounter Medications as of 08/17/2019  Medication Sig  . ezetimibe (ZETIA) 10 MG tablet Take 1 tablet (10 mg total) by mouth daily.  Marland Kitchen allopurinol (ZYLOPRIM) 100 MG tablet Take 1 tablet (100 mg total) by mouth daily.  Marland Kitchen aspirin 81 MG chewable tablet Chew 1 tablet (81 mg total) by mouth daily.  Marland Kitchen atorvastatin (LIPITOR) 20 MG tablet Take 1 tablet (20 mg total) by mouth daily.  . cetirizine (ZYRTEC) 10 MG tablet Take 1 tablet (10 mg total) by mouth as needed for allergies. Reported on 02/09/2015  . clopidogrel (PLAVIX) 75 MG tablet Take 1 tablet (75 mg total) by mouth daily.  . diclofenac (VOLTAREN) 75 MG EC tablet Take 1 tablet (75 mg total) by mouth 2 (two) times daily.  . DULoxetine (CYMBALTA) 60 MG capsule Take 1 capsule (60 mg total) by mouth 2 (two) times daily.  Marland Kitchen losartan (COZAAR) 50 MG tablet Take 1 tablet (50 mg total) by mouth daily.  Marland Kitchen oxyCODONE-acetaminophen (PERCOCET) 10-325 MG tablet Take 1 tablet by mouth daily as needed for pain.   . pregabalin (LYRICA) 75 MG capsule Take 75 mg by mouth 2 (two) times daily.  Marland Kitchen triamcinolone (NASACORT) 55 MCG/ACT AERO nasal inhaler Place 2 sprays into the nose daily.   No  facility-administered encounter medications on file as of 08/17/2019.    Goals Addressed            This Visit's Progress   . PharmD - Medication Assistance       CARE PLAN ENTRY (see longitudinal plan of care for additional care plan information)  Current Barriers:  . Financial Barriers in complicated patient with multiple medical conditions including CAD, HTN, HLD, arthritis, sleep apnea, and anxiety  patient has [employer] Saver Plus PDP Part D plan and reports copay for Zetia is cost prohibitive at this time  Pharmacist Clinical Goal(s):  Marland Kitchen Over the next 30 days, patient will work with PharmD and providers to relieve medication access concerns  Interventions: . Counseled on Medicare Part D plan and medication assistance options . Zetia: Patient reported meeting income criteria for [place] [employer]ch[employer] Fund, which is currently Open o Again provide patient with contact information for [employer] . Schedule appointment with patient to complete medication review and follow up regarding medication assistance  o Patient unable to continue conversation today as he is at work. Bertram Savin care team collaboration (see longitudinal plan of care)  Patient Self Care Activities:  . Patient will provide necessary portions of application   Please see past updates related to this goal by clicking on the "Past Updates" button in the selected goal         Plan  Telephone  follow up appointment with care management team member scheduled for: 8/16 at 4 pm  Harlow Asa, PharmD, [place] [phone]

## 2019-09-01 ENCOUNTER — Ambulatory Visit: Payer: Medicare Other | Admitting: Family Medicine

## 2019-09-01 DIAGNOSIS — Z79891 Long term (current) use of opiate analgesic: Secondary | ICD-10-CM | POA: Diagnosis not present

## 2019-09-01 DIAGNOSIS — M961 Postlaminectomy syndrome, not elsewhere classified: Secondary | ICD-10-CM | POA: Diagnosis not present

## 2019-09-07 ENCOUNTER — Telehealth: Payer: Self-pay

## 2019-09-07 ENCOUNTER — Telehealth: Payer: Self-pay | Admitting: Pharmacist

## 2019-09-07 NOTE — Telephone Encounter (Signed)
°  Chronic Care Management   Outreach Note  09/07/2019 Name: Robert Greer MRN: [id number] DOB: [birth year]  Referred by: Verl Bangs, FNP Reason for referral : No chief complaint on file.   Was unable to reach patient via telephone today and have left HIPAA compliant voicemail asking patient to return my call.   Follow Up Plan: Will collaborate with Care Guide to outreach to schedule follow up with me  Harlow Asa, PharmD, [facility] [phone]

## 2019-09-15 ENCOUNTER — Telehealth: Payer: Self-pay

## 2019-09-15 NOTE — Chronic Care Management (AMB) (Signed)
°  Care Management   Note  09/15/2019 Name: Robert Greer MRN: [id number] DOB: [birth year]  Robert Greer is a 64 y.o. year old male who is a primary care patient of Lorine Bears, Lupita Raider, FNP and is actively engaged with the care management team. I reached out to Robert Greer by phone today to assist with re-scheduling a follow up visit with the Pharmacist  Follow up plan: Unsuccessful telephone outreach attempt made. A HIPPA compliant phone message was left for the patient providing contact information and requesting a return call.  The care management team will reach out to the patient again over the next 7 days.  If patient returns call to provider office, please advise to call [facility]  at [place], [place], [place], [place] [address] Direct Dial: [phone] [name] Website: [email or web address]

## 2019-09-21 NOTE — Chronic Care Management (AMB) (Signed)
  Care Management   Note  09/21/2019 Name: Robert Greer MRN: [id number] DOB: 1955/03/06  Robert Greer is a 64 y.o. year old male who is a primary care patient of Lorine Bears, Lupita Raider, FNP and is actively engaged with the care management team. I reached out to Robert Greer by phone today to assist with re-scheduling a follow up visit with the Pharmacist  Follow up plan: Unsuccessful telephone outreach attempt made. A HIPPA compliant phone message was left for the patient providing contact information and requesting a return call.  The care management team will reach out to the patient again over the next 7 days.  If patient returns call to provider office, please advise to call [facility]  at [place], [place], [place], [place] [address] Direct Dial: [phone] [email or web address]: [email or web address]

## 2019-09-30 NOTE — Chronic Care Management (AMB) (Signed)
  Care Management   Note  09/30/2019 Name: Robert Greer MRN: [id number] DOB: 09-11-1955  Robert Greer is a 64 y.o. year old male who is a primary care patient of Lorine Bears, Lupita Raider, Robert Greer and is actively engaged with the care management team. I reached out to Robert Greer by phone today to assist with re-scheduling a follow up visit with the Pharmacist  Follow up plan: Unable to make contact on outreach attempts x 3. PCP Robert Skeeters, Robert Greer and Robert Greer  notified via routed documentation in medical record.   Robert Greer, Robert Greer, Robert Greer, Robert Greer [address] Direct Dial: [phone] [email or web address]: [email or web address]

## 2019-09-30 NOTE — Telephone Encounter (Signed)
3rd unsuccessful outreach to r/s f/u  

## 2019-11-22 NOTE — Progress Notes (Signed)
Date:  11/23/2019   ID:  Robert Greer, DOB [birth year], MRN [id number]  Patient Location:  [place] [address]   Provider location:   [facility], [place] office  PCP:  Verl Bangs, FNP  Cardiologist:  [facility]  Chief Complaint  Patient presents with  . other    6 month follow up. Meds reviewed by the pt. verbally. "doing well."      History of Present Illness:    Robert Greer is a 64 y.o. 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  history of right shoulder repair, back surgery, partial knee replacement  Still troubled by chronic back pain, stenosis Reports he is thinking about spinal cord stimulator  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, now drives a truck on the side Denies any significant chest pain or shortness of breath on exertion  Weight has jumped significantly over the past year or 2 Was previously 249 in 2018 now 269 Eating more, less active  Medications reviewed with him in detail Lab Results  Component Value Date   CHOL 149 02/02/2019   HDL 67 02/02/2019   LDLCALC 65 02/02/2019   TRIG 93 02/02/2019   lipitor and zetia, no side effects  Nonsmoker   Prior CV studies:   The following studies were reviewed today:  Echocardiogram report was not completed, ejection fraction unavailable but estimated at normal, 60% with no wall motion abnormality on stress test  Cath 2016  Mid RCA lesion, 100% stenosed.  Dist Cx lesion, 60% stenosed.  Ost 1st Diag to 1st Diag lesion, 50% stenosed.  Prox LAD to Mid LAD lesion, 85% stenosed. Post  intervention, there is a 5% residual stenosis.  The left ventricular systolic function is normal.   1. Significant three-vessel coronary artery disease.  Occluded mid right coronary artery is likely the culprit for myocardial infarction.  However, there are readily right to right collaterals and left-to-right collaterals. There is a 60% distal left circumflex stenosis and an 85% proximal to mid heavily calcified LAD disease.  2. Low normal LV systolic function with mildly elevated left ventricular end-diastolic pressure.  3. Successful angioplasty and 3 overlapped drug-eluting stent placement to the proximal to mid LAD. Sluggish flow in the jailed first diagonal which improved with nitroglycerin.  It appears that the RCA occlusion is more than 42 days old and already has some faint collaterals. Thus, no PCI was performed. The distal vessel also appears to be diffusely diseased and does not seem to be a good target for intervention. I decided to intervene in the proximal to mid LAD which was heavily calcified. This was a difficult procedure. The distal left circumflex disease can be left to be treated medically. Continue indefinite dual antiplatelet therapy if possible. Aggressive treatment of risk factors.   Past Medical History:  Diagnosis Date  . Anxiety   . Arthritis   . Backache, unspecified   . CAD (coronary artery disease)    a. 12/2014 NSTEMI/PCI: LM nl, LAD 85p/m (2.5x22 Resolute Integrity DES), D1 50ost, D2/3 small/nl, LCX 60d, OM1 nl, OM2 min irregs, OM3 nl, RCA 162m (likely  culprilt w/ L->R, R->R collats-->Med Rx). Nl EF.  Marland Kitchen Depressive disorder, not elsewhere classified   . Gout   . History of echocardiogram    a. 12/2014 Echo: EF 60-65%, no rwma, mild MR. Nl RV fxn.  . Hyperlipidemia   . Hypertension   . Lipoma of back 02/09/2015  . Measles   . Mobitz (type) II atrioventricular block   . Mumps   . Other hemochromatosis 12/05/2016  . Sleep apnea    Past Surgical  History:  Procedure Laterality Date  . ANTERIOR FUSION LUMBAR SPINE    . BACK SURGERY  2012   x2  . CARDIAC CATHETERIZATION N/A 01/10/2015   Procedure: Left Heart Cath and Coronary Angiography;  Surgeon: Wellington Hampshire, MD;  Location: [facility];  Service: Cardiovascular;  Laterality: N/A;  . CARDIAC CATHETERIZATION N/A 01/10/2015   Procedure: Coronary Stent Intervention;  Surgeon: Wellington Hampshire, MD;  Location: [facility];  Service: Cardiovascular;  Laterality: N/A;  . COLONOSCOPY  2005  . COLONOSCOPY WITH PROPOFOL N/A 02/14/2017   Procedure: COLONOSCOPY WITH PROPOFOL;  Surgeon: [name], MD;  Location: [facility] ENDOSCOPY;  Service: Gastroenterology;  Laterality: N/A;  . ESOPHAGOGASTRODUODENOSCOPY (EGD) WITH PROPOFOL N/A 02/14/2017   Procedure: ESOPHAGOGASTRODUODENOSCOPY (EGD) WITH PROPOFOL;  Surgeon: [name], MD;  Location: [facility] ENDOSCOPY;  Service: Gastroenterology;  Laterality: N/A;  . KNEE SURGERY     bilateral  . TONSILLECTOMY AND ADENOIDECTOMY    . TOTAL SHOULDER REPLACEMENT Right      Current Meds  Medication Sig  . allopurinol (ZYLOPRIM) 100 MG tablet Take 1 tablet (100 mg total) by mouth daily.  Marland Kitchen aspirin 81 MG chewable tablet Chew 1 tablet (81 mg total) by mouth daily.  Marland Kitchen atorvastatin (LIPITOR) 20 MG tablet Take 1 tablet (20 mg total) by mouth daily.  . cetirizine (ZYRTEC) 10 MG tablet Take 1 tablet (10 mg total) by mouth as needed for allergies. Reported on 02/09/2015  . clopidogrel (PLAVIX) 75 MG tablet Take 1 tablet (75 mg total) by mouth daily.  . diclofenac (VOLTAREN) 75 MG EC tablet Take 1 tablet (75 mg total) by mouth 2 (two) times daily.  . DULoxetine (CYMBALTA) 60 MG capsule Take 1 capsule (60 mg total) by mouth 2 (two) times daily.  Marland Kitchen ezetimibe (ZETIA) 10 MG tablet Take 1 tablet (10 mg total) by mouth daily.  Marland Kitchen losartan (COZAAR) 50 MG tablet Take 1 tablet (50 mg total) by mouth daily.  Marland Kitchen oxyCODONE-acetaminophen (PERCOCET) 10-325 MG tablet  Take 1 tablet by mouth daily as needed for pain.   . pregabalin (LYRICA) 75 MG capsule Take 75 mg by mouth 2 (two) times daily.  Marland Kitchen triamcinolone (NASACORT) 55 MCG/ACT AERO nasal inhaler Place 2 sprays into the nose daily.     Allergies:   Buspirone and Levaquin [levofloxacin]   Social History   Tobacco Use  . Smoking status: Never Smoker  . Smokeless tobacco: Current User    Types: Snuff  Vaping Use  . Vaping Use: Never used  Substance Use Topics  . Alcohol use: Yes    Alcohol/week: 2.0 - 3.0 standard drinks    Types: 2 - 3 Cans of beer per week    Comment: ocassionally  . Drug use: No     Family Hx: The patient's family history includes Diabetes in his sister; Liver disease in his brother and father; Lung cancer in his paternal grandmother and sister; Stroke in his mother.  ROS:   Please see the history  of present illness.    Review of Systems  Constitutional: Negative.   Respiratory: Negative.   Cardiovascular: Negative.   Gastrointestinal: Negative.   Musculoskeletal: Positive for back pain.  Neurological: Negative.   Psychiatric/Behavioral: Negative.   All other systems reviewed and are negative.    Labs/Other Tests and Data Reviewed:    Recent Labs: 02/02/2019: ALT 30 02/13/2019: BUN 15; Creat 1.18; Hemoglobin 15.1; Platelets 300; Potassium 4.1; Sodium 139   Recent Lipid Panel Lab Results  Component Value Date/Time   CHOL 149 02/02/2019 08:34 AM   TRIG 93 02/02/2019 08:34 AM   HDL 67 02/02/2019 08:34 AM   CHOLHDL 2.2 02/02/2019 08:34 AM   CHOLHDL 3.1 01/08/2015 02:25 AM   LDLCALC 65 02/02/2019 08:34 AM    Wt Readings from Last 3 Encounters:  11/23/19 269 lb 4 oz (122.1 kg)  07/17/19 263 lb 9.6 oz (119.6 kg)  02/06/19 255 lb 6.4 oz (115.8 kg)     Exam:   BP 140/90 (BP Location: Left Arm, Patient Position: Sitting, Cuff Size: Large)   Pulse 75   Ht 6' (1.829 m)   Wt 269 lb 4 oz (122.1 kg)   SpO2 98%   BMI 36.52 kg/m  Constitutional:  oriented to  person, place, and time. No distress.  HENT:  Head: Grossly normal Eyes:  no discharge. No scleral icterus.  Neck: No JVD, no carotid bruits  Cardiovascular: Regular rate and rhythm, no murmurs appreciated Pulmonary/Chest: Clear to auscultation bilaterally, no wheezes or rails Abdominal: Soft.  no distension.  no tenderness.  Musculoskeletal: Normal range of motion Neurological:  normal muscle tone. Coordination normal. No atrophy Skin: Skin warm and dry Psychiatric: normal affect, pleasant  ASSESSMENT & PLAN:    Coronary artery disease of native artery of native heart with stable angina pectoris (HCC) Currently with no symptoms of angina. No further workup at this time. Continue current medication regimen.  Hypertension, unspecified type Blood pressure is well controlled on today's visit. No changes made to the medications.  Mixed hyperlipidemia Weight up 20 pounds in the past several years Stressed importance of walking program, weight loss/lifestyle modification Continue Lipitor Zetia  Chronic low back pain, unspecified back pain laterality, unspecified whether sciatica present Followed by neurosurgery  back surgeries, still with chronic pain Think about spinal cord stimulator    Medication Changes: No changes made   Total encounter time more than 25 minutes  Greater than 50% was spent in counseling and coordination of care with the patient    Signed, Ida Rogue, MD  11/23/2019 12:39 PM    [place] Office [address], [place], [place] [address]

## 2019-11-23 ENCOUNTER — Encounter: Payer: Self-pay | Admitting: Cardiovascular Disease

## 2019-11-23 ENCOUNTER — Other Ambulatory Visit: Payer: Self-pay

## 2019-11-23 ENCOUNTER — Ambulatory Visit (INDEPENDENT_AMBULATORY_CARE_PROVIDER_SITE_OTHER): Payer: Medicare Other | Admitting: Cardiovascular Disease

## 2019-11-23 VITALS — BP 140/90 | HR 75 | Ht 72.0 in | Wt 269.2 lb

## 2019-11-23 DIAGNOSIS — I2510 Atherosclerotic heart disease of native coronary artery without angina pectoris: Secondary | ICD-10-CM | POA: Diagnosis not present

## 2019-11-23 DIAGNOSIS — E785 Hyperlipidemia, unspecified: Secondary | ICD-10-CM

## 2019-11-23 DIAGNOSIS — I10 Essential (primary) hypertension: Secondary | ICD-10-CM

## 2019-11-23 DIAGNOSIS — I470 Re-entry ventricular arrhythmia: Secondary | ICD-10-CM | POA: Diagnosis not present

## 2019-11-23 DIAGNOSIS — I25118 Atherosclerotic heart disease of native coronary artery with other forms of angina pectoris: Secondary | ICD-10-CM

## 2019-11-23 NOTE — Patient Instructions (Signed)
Medication Instructions:  No changes  If you need a refill on your cardiac medications before your next appointment, please call your pharmacy.    Lab work: No new labs needed   If you have labs (blood work) drawn today and your tests are completely normal, you will receive your results only by: . MyChart Message (if you have MyChart) OR . A paper copy in the mail If you have any lab test that is abnormal or we need to change your treatment, we will call you to review the results.   Testing/Procedures: No new testing needed   Follow-Up: At [facility], you and your health needs are our priority.  As part of our continuing mission to provide you with exceptional heart care, we have created designated Provider Care Teams.  These Care Teams include your primary Cardiologist (physician) and Advanced Practice Providers (APPs -  Physician Assistants and Nurse Practitioners) who all work together to provide you with the care you need, when you need it.  . You will need a follow up appointment in 12 months  . Providers on your designated Care Team:   . Christopher Berge, NP . Ryan Dunn, PA-C . Jacquelyn Visser, PA-C  Any Other Special Instructions Will Be Listed Below (If Applicable).  COVID-19 Vaccine Information can be found at: [email or web address] For questions related to vaccine distribution or appointments, please email [email or web address] or call [phone].     

## 2019-12-03 DIAGNOSIS — M5416 Radiculopathy, lumbar region: Secondary | ICD-10-CM | POA: Diagnosis not present

## 2019-12-03 DIAGNOSIS — Z79891 Long term (current) use of opiate analgesic: Secondary | ICD-10-CM | POA: Diagnosis not present

## 2019-12-03 DIAGNOSIS — M961 Postlaminectomy syndrome, not elsewhere classified: Secondary | ICD-10-CM | POA: Diagnosis not present

## 2019-12-03 DIAGNOSIS — Z5181 Encounter for therapeutic drug level monitoring: Secondary | ICD-10-CM | POA: Diagnosis not present

## 2019-12-04 ENCOUNTER — Telehealth: Payer: Self-pay | Admitting: Cardiovascular Disease

## 2019-12-04 ENCOUNTER — Other Ambulatory Visit: Payer: Self-pay | Admitting: Family Medicine

## 2019-12-04 DIAGNOSIS — M10071 Idiopathic gout, right ankle and foot: Secondary | ICD-10-CM

## 2019-12-04 NOTE — Telephone Encounter (Signed)
Robert Greer 64 year old male was recently seen in the clinic on 11/23/2019.  His main complaint was related to chronic back pain.  He would like to undergo insertion of spinal cord stimulator.  He was stable from a cardiac standpoint at the time of his visit.  His PMH includes arthritis, CAD (stent placement proximal LAD, occluded RCA and mid region, right to right collaterals and left-to-right collaterals), palpitations, arrhythmia, and normal echocardiogram.  May his Plavix be held prior to his procedure?  Thank you for your help.  Please direct response to CV DIV preop for.  Jossie Ng. Anzley Dibbern NP-C    12/04/2019, 11:04 AM [place] [address] Office [phone] Fax [phone]

## 2019-12-04 NOTE — Telephone Encounter (Signed)
   [facility] Pre-operative Risk Assessment    HEARTCARE STAFF: - Please ensure there is not already an duplicate clearance open for this procedure. - Under Visit Info/Reason for Call, type in Other and utilize the format Clearance MM/DD/YY or Clearance TBD. Do not use dashes or single digits. - If request is for dental extraction, please clarify the # of teeth to be extracted.  Request for surgical clearance:  1. What type of surgery is being performed? Permanent placement of spinal cord stimulator  2. When is this surgery scheduled? TBD  3. What type of clearance is required (medical clearance vs. Pharmacy clearance to hold med vs. Both)? pharm  4. Are there any medications that need to be held prior to surgery and how long? Plavix 7 days prior  5. Practice name and name of physician performing surgery? [facility] Dr. Fayne Norrie  6. What is the office phone number? [phone]   7.   What is the office fax number? [place]   Anesthesia type (None, local, MAC, general) ? genreal   Robert Greer 12/04/2019, 10:53 AM  _________________________________________________________________   (provider comments below)

## 2019-12-06 NOTE — Telephone Encounter (Signed)
Acceptable risk to come off Plavix 7 days given his severe back pain Would stay on aspirin 81 mg through procedure Certainly a risk to come off the Plavix given his coronary disease, prior angioplasty, several stents placed Has been stable since 2016

## 2020-01-01 ENCOUNTER — Other Ambulatory Visit: Payer: Self-pay

## 2020-01-01 ENCOUNTER — Ambulatory Visit (INDEPENDENT_AMBULATORY_CARE_PROVIDER_SITE_OTHER): Payer: Medicare Other | Admitting: Family Medicine

## 2020-01-01 ENCOUNTER — Encounter: Payer: Self-pay | Admitting: Family Medicine

## 2020-01-01 VITALS — BP 126/74 | HR 88 | Temp 97.1°F | Resp 17 | Ht 72.0 in | Wt 274.8 lb

## 2020-01-01 DIAGNOSIS — M25541 Pain in joints of right hand: Secondary | ICD-10-CM | POA: Diagnosis not present

## 2020-01-01 DIAGNOSIS — Z23 Encounter for immunization: Secondary | ICD-10-CM | POA: Diagnosis not present

## 2020-01-01 DIAGNOSIS — F419 Anxiety disorder, unspecified: Secondary | ICD-10-CM

## 2020-01-01 DIAGNOSIS — M25542 Pain in joints of left hand: Secondary | ICD-10-CM

## 2020-01-01 MED ORDER — DULOXETINE HCL 60 MG PO CPEP: 60.0000 mg | ORAL_CAPSULE | Freq: Two times a day (BID) | ORAL | 1 refills | Status: DC

## 2020-01-01 NOTE — Progress Notes (Signed)
Subjective:    Patient ID: Robert Greer, male    DOB: [birth year], 64 y.o.   MRN: [id number]  Robert Greer is a 64 y.o. male presenting on 01/01/2020 for Anxiety   HPI  Mr. Ensminger presents to clinic for follow up on his anxiety and for concerns of joint pain with swelling and tenderness.  Reports that his anxiety is stable and well controlled with his prescription of duloxetine.    Has concerns for multiple joint pain in his hands, that he has been noticing decreased grip strength and swelling at the distal interphalangeal joints.  Has not had labs drawn in the past for evaluation of RA vs OA.  Has been wearing gloves with copper in them to help with compression with mild improvement in symptoms.  Depression screen [facility] 2/9 04/22/2019 09/11/2018 08/21/2018  Decreased Interest 0 0 0  Down, Depressed, Hopeless 0 0 1  PHQ - 2 Score 0 0 1  Altered sleeping 0 0 0  Tired, decreased energy 0 0 1  Change in appetite 0 0 0  Feeling bad or failure about yourself  0 0 0  Trouble concentrating 0 0 0  Moving slowly or fidgety/restless 0 0 0  Suicidal thoughts 0 0 -  PHQ-9 Score 0 0 2  Difficult doing work/chores Not difficult at all Not difficult at all Not difficult at all  Some recent data might be hidden    Social History   Tobacco Use  . Smoking status: Never Smoker  . Smokeless tobacco: Current User    Types: Snuff  Vaping Use  . Vaping Use: Never used  Substance Use Topics  . Alcohol use: Yes    Alcohol/week: 2.0 - 3.0 standard drinks    Types: 2 - 3 Cans of beer per week    Comment: ocassionally  . Drug use: No    Review of Systems  Constitutional: Negative.   HENT: Negative.   Eyes: Negative.   Respiratory: Negative.   Cardiovascular: Negative.   Gastrointestinal: Negative.   Endocrine: Negative.   Genitourinary: Negative.   Musculoskeletal: Positive for arthralgias and joint swelling. Negative for back pain, gait problem, myalgias, neck pain and neck stiffness.  Skin:  Negative.   Allergic/Immunologic: Negative.   Neurological: Negative.   Hematological: Negative.   Psychiatric/Behavioral: Negative for agitation, behavioral problems, confusion, decreased concentration, dysphoric mood, hallucinations, self-injury, sleep disturbance and suicidal ideas. The patient is nervous/anxious. The patient is not hyperactive.    Per HPI unless specifically indicated above     Objective:    BP 126/74 (BP Location: Right Arm, Patient Position: Sitting, Cuff Size: Large)   Pulse 88   Temp (!) 97.1 F (36.2 C) (Oral)   Resp 17   Ht 6' (1.829 m)   Wt 274 lb 12.8 oz (124.6 kg)   SpO2 98%   BMI 37.27 kg/m   Wt Readings from Last 3 Encounters:  01/01/20 274 lb 12.8 oz (124.6 kg)  11/23/19 269 lb 4 oz (122.1 kg)  07/17/19 263 lb 9.6 oz (119.6 kg)    Physical Exam Vitals and nursing note reviewed.  Constitutional:      General: He is not in acute distress.    Appearance: Normal appearance. He is well-developed and well-groomed. He is not ill-appearing or toxic-appearing.  HENT:     Head: Normocephalic and atraumatic.     Nose:     Comments: Lizbeth Bark is in place, covering mouth and nose. Eyes:     General:  Right eye: No discharge.        Left eye: No discharge.     Extraocular Movements: Extraocular movements intact.     Conjunctiva/sclera: Conjunctivae normal.     Pupils: Pupils are equal, round, and reactive to light.  Cardiovascular:     Rate and Rhythm: Normal rate and regular rhythm.     Pulses: Normal pulses.     Heart sounds: Normal heart sounds. No murmur heard. No friction rub. No gallop.   Pulmonary:     Effort: Pulmonary effort is normal. No respiratory distress.     Breath sounds: Normal breath sounds.  Musculoskeletal:        General: Tenderness present.     Right hand: Swelling, deformity and bony tenderness present. Decreased strength. Normal sensation. Normal capillary refill.     Left hand: Swelling, deformity and bony  tenderness present. Decreased strength. Normal sensation. Normal capillary refill.     Right lower leg: No edema.     Left lower leg: No edema.     Comments: Distal interphalangeal swelling notes to all 10 fingers with redness/tenderness to palpation  Skin:    General: Skin is warm and dry.     Capillary Refill: Capillary refill takes less than 2 seconds.  Neurological:     General: No focal deficit present.     Mental Status: He is alert and oriented to person, place, and time.  Psychiatric:        Attention and Perception: Attention and perception normal.        Mood and Affect: Mood and affect normal.        Speech: Speech normal.        Behavior: Behavior normal. Behavior is cooperative.        Thought Content: Thought content normal.        Cognition and Memory: Cognition and memory normal.    Results for orders placed or performed in visit on 07/17/19  POCT Urinalysis Dipstick  Result Value Ref Range   Color, UA Yellow    Clarity, UA clear    Glucose, UA Negative Negative   Bilirubin, UA negative    Ketones, UA negative    Spec Grav, UA 1.015 1.010 - 1.025   Blood, UA negative    pH, UA 5.0 5.0 - 8.0   Protein, UA Negative Negative   Urobilinogen, UA 0.2 0.2 or 1.0 E.U./dL   Nitrite, UA NEGATIVE    Leukocytes, UA Negative Negative   Appearance     Odor        Assessment & Plan:   Problem List Items Addressed This Visit      Other   Anxiety - Primary    Reports stable and well controlled with duloxetine 60mg  BID.  Will continue.  Prescription refill sent to pharmacy on file.  To RTC in 6 months for re-evaluation.      Relevant Medications   DULoxetine (CYMBALTA) 60 MG capsule   Arthralgia of both hands    Herbeden's nodes noted to all fingers, painful, tender, with redness.  Reports decreased grip strength at times, has been wearing gloves with copper in them that have been helping at times.  Is interested in having labs drawn for evaluation of RA.  Will have  labs drawn today.      Relevant Orders   Rheumatoid factor   Cyclic citrul peptide antibody, IgG   Antinuclear Antib (ANA)   CBC with Differential   COMPLETE METABOLIC PANEL WITH GFR  Meds ordered this encounter  Medications  . DULoxetine (CYMBALTA) 60 MG capsule    Sig: Take 1 capsule (60 mg total) by mouth 2 (two) times daily.    Dispense:  180 capsule    Refill:  1   Follow up plan: Return in about 6 months (around 07/01/2020) for Anxiety f/u.   Harlin Rain, [place] [facility] Nurse Practitioner [facility] 01/01/2020, 4:52 PM

## 2020-01-01 NOTE — Patient Instructions (Signed)
We will contact you when we receive your lab results.  Medication refills have been sent to your pharmacy on file.  Schedule your DOT Physical before 02/19/2020  We will plan to see you back in 6 months for anxiety follow up visit  You will receive a survey after today's visit either digitally by e-mail or paper by [place] mail. Your experiences and feedback matter to [place].  Please respond so we know how we are doing as we provide care for you.  Call us with any questions/concerns/needs.  It is my goal to be available to you for your health concerns.  Thanks for choosing me to be a partner in your healthcare needs!  Harlin Rain, FNP-C Family Nurse Practitioner [facility] Phone: [phone]

## 2020-01-01 NOTE — Assessment & Plan Note (Signed)
Reports stable and well controlled with duloxetine 60mg  BID.  Will continue.  Prescription refill sent to pharmacy on file.  To RTC in 6 months for re-evaluation.

## 2020-01-01 NOTE — Assessment & Plan Note (Signed)
Herbeden's nodes noted to all fingers, painful, tender, with redness.  Reports decreased grip strength at times, has been wearing gloves with copper in them that have been helping at times.  Is interested in having labs drawn for evaluation of RA.  Will have labs drawn today.

## 2020-01-04 LAB — COMPLETE METABOLIC PANEL WITH GFR
AG Ratio: 1.8 (calc) (ref 1.0–2.5)
ALT: 49 U/L — ABNORMAL HIGH (ref 9–46)
AST: 32 U/L (ref 10–35)
Albumin: 4.5 g/dL (ref 3.6–5.1)
Alkaline phosphatase (APISO): 60 U/L (ref 35–144)
BUN: 19 mg/dL (ref 7–25)
CO2: 29 mmol/L (ref 20–32)
Calcium: 9.7 mg/dL (ref 8.6–10.3)
Chloride: 104 mmol/L (ref 98–110)
Creat: 1.21 mg/dL (ref 0.70–1.25)
GFR, Est African American: 72 mL/min/{1.73_m2} (ref >=60)
GFR, Est Non African American: 62 mL/min/{1.73_m2} (ref >=60)
Globulin: 2.5 g/dL (calc) (ref 1.9–3.7)
Glucose, Bld: 105 mg/dL — ABNORMAL HIGH (ref 65–99)
Potassium: 4.3 mmol/L (ref 3.5–5.3)
Sodium: 140 mmol/L (ref 135–146)
Total Bilirubin: 0.3 mg/dL (ref 0.2–1.2)
Total Protein: 7 g/dL (ref 6.1–8.1)

## 2020-01-04 LAB — CBC WITH DIFFERENTIAL/PLATELET
Absolute Monocytes: 622 cells/uL (ref 200–950)
Basophils Absolute: 37 cells/uL (ref 0–200)
Basophils Relative: 0.5 %
Eosinophils Absolute: 333 cells/uL (ref 15–500)
Eosinophils Relative: 4.5 %
HCT: 43.6 % (ref 38.5–50.0)
Hemoglobin: 15.2 g/dL (ref 13.2–17.1)
Lymphs Abs: 1554 cells/uL (ref 850–3900)
MCH: 31.9 pg (ref 27.0–33.0)
MCHC: 34.9 g/dL (ref 32.0–36.0)
MCV: 91.4 fL (ref 80.0–100.0)
MPV: 10 fL (ref 7.5–12.5)
Monocytes Relative: 8.4 %
Neutro Abs: 4854 cells/uL (ref 1500–7800)
Neutrophils Relative %: 65.6 %
Platelets: 292 10*3/uL (ref 140–400)
RBC: 4.77 10*6/uL (ref 4.20–5.80)
RDW: 12.2 % (ref 11.0–15.0)
Total Lymphocyte: 21 %
WBC: 7.4 10*3/uL (ref 3.8–10.8)

## 2020-01-04 LAB — CYCLIC CITRUL PEPTIDE ANTIBODY, IGG: Cyclic Citrullin Peptide Ab: <16 UNITS

## 2020-01-04 LAB — ANA: Anti Nuclear Antibody (ANA): NEGATIVE

## 2020-01-04 LAB — RHEUMATOID FACTOR: Rheumatoid fact SerPl-aCnc: <14 IU/mL (ref <14)

## 2020-01-04 NOTE — Addendum Note (Signed)
Addended by: Wilson Singer on: 01/04/2020 08:51 AM   Modules accepted: Orders

## 2020-01-25 ENCOUNTER — Other Ambulatory Visit: Payer: Self-pay

## 2020-01-26 LAB — IRON,TIBC AND FERRITIN PANEL
%SAT: 40 % (calc) (ref 20–48)
Ferritin: 308 ng/mL (ref 24–380)
Iron: 118 ug/dL (ref 50–180)
TIBC: 294 mcg/dL (calc) (ref 250–425)

## 2020-01-27 ENCOUNTER — Ambulatory Visit: Payer: Medicare Other | Admitting: Family Medicine

## 2020-01-27 ENCOUNTER — Other Ambulatory Visit: Payer: Self-pay

## 2020-01-27 ENCOUNTER — Other Ambulatory Visit: Payer: Self-pay | Admitting: Family Medicine

## 2020-01-27 VITALS — BP 119/64 | HR 80 | Temp 97.1°F | Ht 72.0 in | Wt 274.0 lb

## 2020-01-27 DIAGNOSIS — J0100 Acute maxillary sinusitis, unspecified: Secondary | ICD-10-CM

## 2020-01-27 DIAGNOSIS — Z024 Encounter for examination for driving license: Secondary | ICD-10-CM

## 2020-01-27 MED ORDER — AMOXICILLIN-POT CLAVULANATE 875-125 MG PO TABS
1.0000 | ORAL_TABLET | Freq: Two times a day (BID) | ORAL | 0 refills | Status: DC
Start: 2020-01-27 — End: 2020-02-09

## 2020-01-27 NOTE — Progress Notes (Signed)
At end of DOT PE, patient notes having 9+ days of sinus pressure/pain with purulent nasal drainage.  No COVID testing completed.  Denies fevers, sore throat, change in taste/smell, cough, SOB, DOE, CP, abdominal pain, n/v/d.  Will send in Augmentin 875-125mg  BID x 10 days.

## 2020-01-27 NOTE — Assessment & Plan Note (Addendum)
DOT Certificate provided x 1 year  Hearing test: Pass at 15' Vision: 20/25 R, 20/25 L, 20/25 Both Uncorrected Urine 1.015, Trace Protein, Neg Glucose, Neg Hematuria

## 2020-01-27 NOTE — Progress Notes (Addendum)
Subjective:    Patient ID: Robert Greer, male    DOB: [birth year], 65 y.o.   MRN: [id number]  Robert Greer is a 65 y.o. male presenting on 01/27/2020 for Employment Physical (DOT physical)   HPI  Robert Greer presents to clinic for DOT PE  Depression screen [facility] 2/9 04/22/2019 09/11/2018 08/21/2018  Decreased Interest 0 0 0  Down, Depressed, Hopeless 0 0 1  PHQ - 2 Score 0 0 1  Altered sleeping 0 0 0  Tired, decreased energy 0 0 1  Change in appetite 0 0 0  Feeling bad or failure about yourself  0 0 0  Trouble concentrating 0 0 0  Moving slowly or fidgety/restless 0 0 0  Suicidal thoughts 0 0 -  PHQ-9 Score 0 0 2  Difficult doing work/chores Not difficult at all Not difficult at all Not difficult at all  Some recent data might be hidden    Social History   Tobacco Use  . Smoking status: Never Smoker  . Smokeless tobacco: Current User    Types: Snuff  Vaping Use  . Vaping Use: Never used  Substance Use Topics  . Alcohol use: Yes    Alcohol/week: 2.0 - 3.0 standard drinks    Types: 2 - 3 Cans of beer per week    Comment: ocassionally  . Drug use: No    Review of Systems  Constitutional: Negative.   HENT: Negative.   Eyes: Negative.   Respiratory: Negative.   Cardiovascular: Negative.   Gastrointestinal: Negative.   Endocrine: Negative.   Genitourinary: Negative.   Musculoskeletal: Negative.   Skin: Negative.   Allergic/Immunologic: Negative.   Neurological: Negative.   Hematological: Negative.   Psychiatric/Behavioral: Negative.    Per HPI unless specifically indicated above     Objective:    BP 119/64 (BP Location: Left Arm, Patient Position: Sitting, Cuff Size: Large)   Pulse 80   Temp (!) 97.1 F (36.2 C) (Temporal)   Ht 6' (1.829 m)   Wt 274 lb (124.3 kg)   BMI 37.16 kg/m   Wt Readings from Last 3 Encounters:  01/27/20 274 lb (124.3 kg)  01/01/20 274 lb 12.8 oz (124.6 kg)  11/23/19 269 lb 4 oz (122.1 kg)    Physical Exam Vitals reviewed.   Constitutional:      General: He is not in acute distress.    Appearance: Normal appearance. He is well-developed and well-groomed. He is obese. He is not ill-appearing or toxic-appearing.  HENT:     Head: Normocephalic.     Right Ear: Tympanic membrane, ear canal and external ear normal. There is no impacted cerumen.     Left Ear: Tympanic membrane, ear canal and external ear normal. There is no impacted cerumen.     Nose: Nose normal. No congestion or rhinorrhea.     Mouth/Throat:     Mouth: Mucous membranes are moist.     Pharynx: Oropharynx is clear. No oropharyngeal exudate or posterior oropharyngeal erythema.  Eyes:     General: Lids are normal. Vision grossly intact. No scleral icterus.       Right eye: No discharge.        Left eye: No discharge.     Extraocular Movements: Extraocular movements intact.     Conjunctiva/sclera: Conjunctivae normal.     Pupils: Pupils are equal, round, and reactive to light.  Cardiovascular:     Rate and Rhythm: Normal rate and regular rhythm.     Pulses: Normal  pulses.          Dorsalis pedis pulses are 2+ on the right side and 2+ on the left side.     Heart sounds: Normal heart sounds. No murmur heard. No friction rub. No gallop.   Pulmonary:     Effort: Pulmonary effort is normal. No respiratory distress.     Breath sounds: Normal breath sounds. No wheezing, rhonchi or rales.  Abdominal:     General: Abdomen is flat. Bowel sounds are normal. There is no distension.     Palpations: Abdomen is soft. There is no hepatomegaly, splenomegaly or mass.     Tenderness: There is no abdominal tenderness. There is no right CVA tenderness, left CVA tenderness, guarding or rebound.     Hernia: No hernia is present.  Musculoskeletal:        General: Normal range of motion.     Cervical back: Normal range of motion and neck supple. No rigidity or tenderness.     Right lower leg: No edema.     Left lower leg: No edema.     Comments: Normal tone, 5/5  strength BUE & BLE  Feet:     Right foot:     Skin integrity: Skin integrity normal.     Left foot:     Skin integrity: Skin integrity normal.  Lymphadenopathy:     Cervical: No cervical adenopathy.  Skin:    General: Skin is warm and dry.     Capillary Refill: Capillary refill takes less than 2 seconds.  Neurological:     General: No focal deficit present.     Mental Status: He is alert and oriented to person, place, and time.     Cranial Nerves: Cranial nerves are intact. No cranial nerve deficit.     Sensory: Sensation is intact. No sensory deficit.     Motor: Motor function is intact. No weakness.     Coordination: Coordination is intact. Coordination normal.     Gait: Gait is intact. Gait normal.     Deep Tendon Reflexes: Reflexes are normal and symmetric. Reflexes normal.  Psychiatric:        Attention and Perception: Attention and perception normal.        Mood and Affect: Mood and affect normal.        Speech: Speech normal.        Behavior: Behavior normal. Behavior is cooperative.        Thought Content: Thought content normal.        Cognition and Memory: Cognition and memory normal.        Judgment: Judgment normal.    Results for orders placed or performed in visit on 01/25/20  Iron, TIBC and Ferritin Panel  Result Value Ref Range   Iron 118 50 - 180 mcg/dL   TIBC 294 250 - 425 mcg/dL (calc)   %SAT 40 20 - 48 % (calc)   Ferritin 308 24 - 380 ng/mL      Assessment & Plan:   Problem List Items Addressed This Visit      Other   Encounter for Department of Transportation (DOT) examination for trucking license - Primary    DOT Certificate provided x 1 year  Hearing test: Pass at 15' Vision: 20/25 R, 20/25 L, 20/25 Both Uncorrected Urine 1.015, Trace Protein, Neg Glucose, Neg Hematuria         No orders of the defined types were placed in this encounter.   Follow up plan: No follow-ups  on file.   Charlaine Dalton, FNP Family Nurse  Practitioner [facility] 01/29/2020, 12:59 PM

## 2020-02-04 ENCOUNTER — Other Ambulatory Visit: Payer: Self-pay | Admitting: Nurse Practitioner

## 2020-02-04 ENCOUNTER — Other Ambulatory Visit: Payer: Self-pay

## 2020-02-04 MED ORDER — LOSARTAN POTASSIUM 50 MG PO TABS: 50.0000 mg | ORAL_TABLET | Freq: Every day | ORAL | 3 refills | Status: DC

## 2020-02-04 NOTE — Telephone Encounter (Signed)
This is a Robert Greer pt 

## 2020-02-04 NOTE — Telephone Encounter (Signed)
Changes Requested   losartan (COZAAR) 50 MG tablet       Sig: Take 1 tablet (50 mg total) by mouth daily.   Disp:  90 tablet  Refills:  3   Start: 02/04/2020   Class: Normal   Non-formulary   Last ordered: Today by Theora Gianotti, NP    Rx #: [id number]   Pharmacy comment: MANUFACTURER BACKORDER -- PLEASE CHaNGE TO ALTERNATE THERAPY - [place].     To be filled at: [facility] - [place], [place] - [address]      Dr. Rockey Situ,  Pharmacy requesting alternative therapy because losartan is currently on backorder. Please advise. Thank you!  Scheduling,  Pt is overdue for follow-up appointment. Please call to schedule. Thank you!

## 2020-02-04 NOTE — Telephone Encounter (Signed)
This is a [place] pt, Dr. Gollan 

## 2020-02-05 NOTE — Telephone Encounter (Signed)
Scheduling,   Please disregard previous message. Pt was seen in November 2021.   Thank you!

## 2020-02-05 NOTE — Telephone Encounter (Signed)
Looks like this Losartan was last order by you yesterday, 02/04/2019,  pharmacy sent a note saying medication is on "back order", is there another mediation we can use? Please advise Thanks

## 2020-02-06 NOTE — Telephone Encounter (Signed)
Reasonable to change to valsartan 80mg  daily.  He should follow bp, as he may require a higher dose going forward.  Should also have bmet a week after starting valsartan.

## 2020-02-08 ENCOUNTER — Other Ambulatory Visit: Payer: Self-pay

## 2020-02-08 DIAGNOSIS — I10 Essential (primary) hypertension: Secondary | ICD-10-CM

## 2020-02-08 DIAGNOSIS — E782 Mixed hyperlipidemia: Secondary | ICD-10-CM

## 2020-02-08 DIAGNOSIS — I2510 Atherosclerotic heart disease of native coronary artery without angina pectoris: Secondary | ICD-10-CM

## 2020-02-08 MED ORDER — VALSARTAN 160 MG PO TABS: 80.0000 mg | ORAL_TABLET | Freq: Every day | ORAL | 3 refills | Status: DC

## 2020-02-09 ENCOUNTER — Encounter: Payer: Self-pay | Admitting: Family Medicine

## 2020-02-09 ENCOUNTER — Ambulatory Visit (INDEPENDENT_AMBULATORY_CARE_PROVIDER_SITE_OTHER): Payer: Medicare Other | Admitting: Family Medicine

## 2020-02-09 ENCOUNTER — Telehealth: Payer: Self-pay

## 2020-02-09 ENCOUNTER — Other Ambulatory Visit: Payer: Self-pay

## 2020-02-09 VITALS — Ht 72.0 in | Wt 275.0 lb

## 2020-02-09 DIAGNOSIS — J0110 Acute frontal sinusitis, unspecified: Secondary | ICD-10-CM | POA: Diagnosis not present

## 2020-02-09 MED ORDER — CETIRIZINE HCL 10 MG PO TABS
10.0000 mg | ORAL_TABLET | ORAL | 1 refills | Status: AC | PRN
Start: 2020-02-09 — End: ?

## 2020-02-09 MED ORDER — DOXYCYCLINE HYCLATE 100 MG PO TABS: 100.0000 mg | ORAL_TABLET | Freq: Two times a day (BID) | ORAL | 0 refills | Status: DC

## 2020-02-09 NOTE — Telephone Encounter (Signed)
Attempted to contact the patient, no answer. LMOM to call back to the office and schedule a telehealth visit to discuss his persistent symptoms.

## 2020-02-09 NOTE — Progress Notes (Signed)
Virtual Visit via Telephone The purpose of this virtual visit is to provide medical care while limiting exposure to the novel coronavirus (COVID19) for both patient and office staff.  Consent was obtained for phone visit:  Yes.   Answered questions that patient had about telehealth interaction:  Yes.   I discussed the limitations, risks, security and privacy concerns of performing an evaluation and management service by telephone. I also discussed with the patient that there may be a patient responsible charge related to this service. The patient expressed understanding and agreed to proceed.  Patient Location: Home Provider Location: [facility] (Office)  Participants in virtual visit: - Patient: Robert Greer - CMA: Orinda Kenner, CMA - Provider: Dr Parks Ranger  ---------------------------------------------------------------------- Chief Complaint  Patient presents with  . Sinusitis  . Cough   PCP Cyndia Skeeters, FNP   S: Reviewed CMA documentation. I have called patient and gathered additional HPI as follows:  Sinusitis Reports that symptoms started at end of December 2021 around Christmas or shortly after onset symptoms with sinus pain and pressure and drainage, he was treated on 01/27/20 by PCP Cyndia Skeeters, FNP with Augmentin antibiotic. No COVID testing was completed. He did improve with regards to drainage in back of throat and into chest, after antibiotic then about 3 days ago onset - Also sick contact with COVID contact with Wife (positive) - He is updated on COVID vaccine, he is eligible for booster but not yet completed. - Allergy to Levaquin antibiotic with rash.  Admits sinus pain or pressure, Denies any fevers, chills, sweats, body ache, cough, shortness of breath, headache, abdominal pain, diarrhea  Past Medical History:  Diagnosis Date  . Anxiety   . Arthritis   . Backache, unspecified   . CAD (coronary artery disease)    a. 12/2014 NSTEMI/PCI:  LM nl, LAD 85p/m (2.5x22 Resolute Integrity DES), D1 50ost, D2/3 small/nl, LCX 60d, OM1 nl, OM2 min irregs, OM3 nl, RCA 162m (likely culprilt w/ L->R, R->R collats-->Med Rx). Nl EF.  Marland Kitchen Depressive disorder, not elsewhere classified   . Gout   . History of echocardiogram    a. 12/2014 Echo: EF 60-65%, no rwma, mild MR. Nl RV fxn.  . Hyperlipidemia   . Hypertension   . Lipoma of back 02/09/2015  . Measles   . Mobitz (type) II atrioventricular block   . Mumps   . Other hemochromatosis 12/05/2016  . Sleep apnea    Social History   Tobacco Use  . Smoking status: Never Smoker  . Smokeless tobacco: Current User    Types: Snuff  Vaping Use  . Vaping Use: Never used  Substance Use Topics  . Alcohol use: Yes    Alcohol/week: 2.0 - 3.0 standard drinks    Types: 2 - 3 Cans of beer per week    Comment: ocassionally  . Drug use: No    Current Outpatient Medications:  .  allopurinol (ZYLOPRIM) 100 MG tablet, Take 1 tablet (100 mg total) by mouth daily., Disp: 90 tablet, Rfl: 0 .  aspirin 81 MG chewable tablet, Chew 1 tablet (81 mg total) by mouth daily., Disp: 30 tablet, Rfl: 3 .  atorvastatin (LIPITOR) 20 MG tablet, Take 1 tablet (20 mg total) by mouth daily., Disp: 90 tablet, Rfl: 3 .  clopidogrel (PLAVIX) 75 MG tablet, Take 1 tablet (75 mg total) by mouth daily., Disp: 90 tablet, Rfl: 3 .  diclofenac (VOLTAREN) 75 MG EC tablet, Take 1 tablet (75 mg total) by mouth  2 (two) times daily., Disp: 180 tablet, Rfl: 2 .  doxycycline (VIBRA-TABS) 100 MG tablet, Take 1 tablet (100 mg total) by mouth 2 (two) times daily. For 10 days. Take with full glass of water, stay upright 30 min after taking., Disp: 20 tablet, Rfl: 0 .  DULoxetine (CYMBALTA) 60 MG capsule, Take 1 capsule (60 mg total) by mouth 2 (two) times daily., Disp: 180 capsule, Rfl: 1 .  ezetimibe (ZETIA) 10 MG tablet, Take 1 tablet (10 mg total) by mouth daily., Disp: 90 tablet, Rfl: 3 .  oxyCODONE-acetaminophen (PERCOCET) 10-325 MG tablet,  Take 1 tablet by mouth daily as needed for pain. , Disp: , Rfl:  .  pregabalin (LYRICA) 75 MG capsule, Take 75 mg by mouth 2 (two) times daily., Disp: , Rfl:  .  triamcinolone (NASACORT) 55 MCG/ACT AERO nasal inhaler, Place 2 sprays into the nose daily., Disp: 1 Inhaler, Rfl: 12 .  valsartan (DIOVAN) 160 MG tablet, Take 0.5 tablets (80 mg total) by mouth daily., Disp: 45 tablet, Rfl: 3 .  cetirizine (ZYRTEC) 10 MG tablet, Take 1 tablet (10 mg total) by mouth as needed for allergies. Reported on 02/09/2015, Disp: 90 tablet, Rfl: 1  Depression screen [facility] 2/9 04/22/2019 09/11/2018 08/21/2018  Decreased Interest 0 0 0  Down, Depressed, Hopeless 0 0 1  PHQ - 2 Score 0 0 1  Altered sleeping 0 0 0  Tired, decreased energy 0 0 1  Change in appetite 0 0 0  Feeling bad or failure about yourself  0 0 0  Trouble concentrating 0 0 0  Moving slowly or fidgety/restless 0 0 0  Suicidal thoughts 0 0 -  PHQ-9 Score 0 0 2  Difficult doing work/chores Not difficult at all Not difficult at all Not difficult at all  Some recent data might be hidden    GAD 7 : Generalized Anxiety Score 04/22/2019 09/11/2018 07/15/2018 03/11/2018  Nervous, Anxious, on Edge 0 1 1 0  Control/stop worrying 0 0 1 0  Worry too much - different things 0 0 1 0  Trouble relaxing 0 0 1 0  Restless 0 0 1 0  Easily annoyed or irritable 1 1 1  0  Afraid - awful might happen 0 0 1 0  Total GAD 7 Score 1 2 7  0  Anxiety Difficulty Not difficult at all Somewhat difficult Not difficult at all Not difficult at all    -------------------------------------------------------------------------- O: No physical exam performed due to remote telephone encounter.  Lab results reviewed.  Recent Results (from the past 2160 hour(s))  Rheumatoid factor     Status: None   Collection Time: 01/01/20  3:49 PM  Result Value Ref Range   Rhuematoid fact SerPl-aCnc 99991111 99991111 IU/mL  Cyclic citrul peptide antibody, IgG     Status: None   Collection Time: 01/01/20   3:49 PM  Result Value Ref Range   Cyclic Citrullin Peptide Ab <16 UNITS    Comment: Reference Range Negative:            <20 Weak Positive:       20-39 Moderate Positive:   40-59 Strong Positive:     >59 .   Antinuclear Antib (ANA)     Status: None   Collection Time: 01/01/20  3:49 PM  Result Value Ref Range   Anti Nuclear Antibody (ANA) NEGATIVE NEGATIVE    Comment: ANA IFA is a first line screen for detecting the presence of up to approximately 150 autoantibodies in various autoimmune diseases.  A negative ANA IFA result suggests an ANA-associated autoimmune disease is not present at this time, but is not definitive. If there is high clinical suspicion for Sjogren's syndrome, testing for anti-SS-A/Ro antibody should be considered. Anti-Jo-1 antibody should be considered for clinically suspected inflammatory myopathies. . AC-0: Negative . International Consensus on ANA Patterns (https://www.hernandez-brewer.com/) . For additional information, please refer to http://education.[employer].com/faq/FAQ177 (This link is being provided for informational/ educational purposes only.) .   CBC with Differential     Status: None   Collection Time: 01/01/20  3:49 PM  Result Value Ref Range   WBC 7.4 3.8 - 10.8 Thousand/uL   RBC 4.77 4.20 - 5.80 Million/uL   Hemoglobin 15.2 13.2 - 17.1 g/dL   HCT 43.6 38.5 - 50.0 %   MCV 91.4 80.0 - 100.0 fL   MCH 31.9 27.0 - 33.0 pg   MCHC 34.9 32.0 - 36.0 g/dL   RDW 12.2 11.0 - 15.0 %   Platelets 292 140 - 400 Thousand/uL   MPV 10.0 7.5 - 12.5 fL   Neutro Abs 4,854 1,500 - 7,800 cells/uL   Lymphs Abs 1,554 850 - 3,900 cells/uL   Absolute Monocytes 622 200 - 950 cells/uL   Eosinophils Absolute 333 15 - 500 cells/uL   Basophils Absolute 37 0 - 200 cells/uL   Neutrophils Relative % 65.6 %   Total Lymphocyte 21.0 %   Monocytes Relative 8.4 %   Eosinophils Relative 4.5 %   Basophils Relative 0.5 %  COMPLETE METABOLIC PANEL WITH  GFR     Status: Abnormal   Collection Time: 01/01/20  3:49 PM  Result Value Ref Range   Glucose, Bld 105 (H) 65 - 99 mg/dL    Comment: .            Fasting reference interval . For someone without known diabetes, a glucose value between 100 and 125 mg/dL is consistent with prediabetes and should be confirmed with a follow-up test. .    BUN 19 7 - 25 mg/dL   Creat 1.21 0.70 - 1.25 mg/dL    Comment: For patients >57 years of age, the reference limit for Creatinine is approximately 13% higher for people identified as African-American. .    GFR, Est Non African American 62 > OR = 60 mL/min/1.14m2   GFR, Est African American 72 > OR = 60 mL/min/1.64m2   BUN/Creatinine Ratio NOT APPLICABLE 6 - 22 (calc)   Sodium 140 135 - 146 mmol/L   Potassium 4.3 3.5 - 5.3 mmol/L   Chloride 104 98 - 110 mmol/L   CO2 29 20 - 32 mmol/L   Calcium 9.7 8.6 - 10.3 mg/dL   Total Protein 7.0 6.1 - 8.1 g/dL   Albumin 4.5 3.6 - 5.1 g/dL   Globulin 2.5 1.9 - 3.7 g/dL (calc)   AG Ratio 1.8 1.0 - 2.5 (calc)   Total Bilirubin 0.3 0.2 - 1.2 mg/dL   Alkaline phosphatase (APISO) 60 35 - 144 U/L   AST 32 10 - 35 U/L   ALT 49 (H) 9 - 46 U/L  Iron, TIBC and Ferritin Panel     Status: None   Collection Time: 01/25/20  9:06 AM  Result Value Ref Range   Iron 118 50 - 180 mcg/dL   TIBC 294 250 - 425 mcg/dL (calc)   %SAT 40 20 - 48 % (calc)   Ferritin 308 24 - 380 ng/mL    -------------------------------------------------------------------------- A&P:  Problem List Items Addressed This Visit  Sinus infection - Primary   Relevant Medications   doxycycline (VIBRA-TABS) 100 MG tablet   cetirizine (ZYRTEC) 10 MG tablet     Consistent with acute frontal sinusitis, likely initially viral URI vs allergic rhinitis component with worsening concern for bacterial infection and 2nd sickening, onset symptoms now about 3 weeks ago, treated with Augmentin initially and then had improvement then worsening Vaccinated  against covid, x 2 doses, not received booster Now acute sick contact wife at home with covid past few days, cannot rule out  Plan: 1. Start taking Doxycycline antibiotic 100mg  twice daily for 10 days. Take with full glass of water and stay upright for at least 30 min after taking, may be seated or standing, but should NOT lay down. This is just a safety precaution, if this medicine does not go all the way down throat well it could cause some burning discomfort to throat and esophagus.  Stop Augmentin  Symptomatic relief medications OTC  COVID testing recommended  Return criteria reviewed   Meds ordered this encounter  Medications  . doxycycline (VIBRA-TABS) 100 MG tablet    Sig: Take 1 tablet (100 mg total) by mouth 2 (two) times daily. For 10 days. Take with full glass of water, stay upright 30 min after taking.    Dispense:  20 tablet    Refill:  0  . cetirizine (ZYRTEC) 10 MG tablet    Sig: Take 1 tablet (10 mg total) by mouth as needed for allergies. Reported on 02/09/2015    Dispense:  90 tablet    Refill:  1    Follow-up: - Return in 1-2 week as needed PRN if not improved   Patient verbalizes understanding with the above medical recommendations including the limitation of remote medical advice.  Specific follow-up and call-back criteria were given for patient to follow-up or seek medical care more urgently if needed.   - Time spent in direct consultation with patient on phone: 9 minutes  Nobie Putnam, [facility] 02/09/2020, 1:06 PM

## 2020-02-09 NOTE — Telephone Encounter (Signed)
Copied from [place] [id number]. Topic: General - Other >> Feb 08, 2020 11:23 AM Leward Quan A wrote: Reason for CRM: Patient called in to inquire of Cyndia Skeeters if she would like to give him another round of antibiotics since he seem to be getting worst again. Asking for a call back at  Ph# [phone]

## 2020-02-09 NOTE — Patient Instructions (Signed)
  1. Start taking Doxycycline antibiotic 100mg  twice daily for 10 days. Take with full glass of water and stay upright for at least 30 min after taking, may be seated or standing, but should NOT lay down. This is just a safety precaution, if this medicine does not go all the way down throat well it could cause some burning discomfort to throat and esophagus.  Stop Augmentin  Symptomatic relief medications OTC  COVID testing recommended  Return criteria reviewed  Please schedule a Follow-up Appointment to: No follow-ups on file.  If you have any other questions or concerns, please feel free to call the office or send a message through [facility]. You may also schedule an earlier appointment if necessary.  Additionally, you may be receiving a survey about your experience at our office within a few days to 1 week by e-mail or mail. We value your feedback.  Nobie Putnam, DO [place]

## 2020-03-07 DIAGNOSIS — M961 Postlaminectomy syndrome, not elsewhere classified: Secondary | ICD-10-CM | POA: Diagnosis not present

## 2020-03-07 DIAGNOSIS — M5416 Radiculopathy, lumbar region: Secondary | ICD-10-CM | POA: Diagnosis not present

## 2020-03-07 DIAGNOSIS — Z79891 Long term (current) use of opiate analgesic: Secondary | ICD-10-CM | POA: Diagnosis not present

## 2020-03-07 DIAGNOSIS — Z5181 Encounter for therapeutic drug level monitoring: Secondary | ICD-10-CM | POA: Diagnosis not present

## 2020-03-10 ENCOUNTER — Other Ambulatory Visit: Payer: Self-pay | Admitting: *Deleted

## 2020-03-10 MED ORDER — CLOPIDOGREL BISULFATE 75 MG PO TABS: 75.0000 mg | ORAL_TABLET | Freq: Every day | ORAL | 1 refills | Status: DC

## 2020-04-11 ENCOUNTER — Other Ambulatory Visit: Payer: Self-pay | Admitting: *Deleted

## 2020-04-11 MED ORDER — ATORVASTATIN CALCIUM 20 MG PO TABS: 20.0000 mg | ORAL_TABLET | Freq: Every day | ORAL | 2 refills | Status: DC

## 2020-04-12 ENCOUNTER — Ambulatory Visit (INDEPENDENT_AMBULATORY_CARE_PROVIDER_SITE_OTHER): Payer: Medicare Other

## 2020-04-12 VITALS — Ht 72.0 in | Wt 265.0 lb

## 2020-04-12 DIAGNOSIS — Z0000 Encounter for general adult medical examination without abnormal findings: Secondary | ICD-10-CM | POA: Diagnosis not present

## 2020-04-12 NOTE — Patient Instructions (Signed)
Robert Greer , Thank you for taking time to come for your Medicare Wellness Visit. I appreciate your ongoing commitment to your health goals. Please review the following plan we discussed and let me know if I can assist you in the future.   Screening recommendations/referrals: Colonoscopy: completed 02/14/2017 Recommended yearly ophthalmology/optometry visit for glaucoma screening and checkup Recommended yearly dental visit for hygiene and checkup  Vaccinations: Influenza vaccine: completed 01/01/2020, due 08/22/2020 Pneumococcal vaccine: due Tdap vaccine: completed 02/04/2018, due 02/05/2028 Shingles vaccine: discussed   Covid-19:  03/06/2019, 04/03/2019  Advanced directives: Please bring a copy of your POA (Power of Attorney) and/or Living Will to your next appointment.   Conditions/risks identified: uses tobacco  Next appointment: Follow up in one year for your annual wellness visit.   Preventive Care 67 Years and Older, Male Preventive care refers to lifestyle choices and visits with your health care provider that can promote health and wellness. What does preventive care include?  A yearly physical exam. This is also called an annual well check.  Dental exams once or twice a year.  Routine eye exams. Ask your health care provider how often you should have your eyes checked.  Personal lifestyle choices, including:  Daily care of your teeth and gums.  Regular physical activity.  Eating a healthy diet.  Avoiding tobacco and drug use.  Limiting alcohol use.  Practicing safe sex.  Taking low doses of aspirin every [place].  Taking vitamin and mineral supplements as recommended by your health care provider. What happens during an annual well check? The services and screenings done by your health care provider during your annual well check will depend on your age, overall health, lifestyle risk factors, and family history of disease. Counseling  Your health care provider may ask you  questions about your:  Alcohol use.  Tobacco use.  Drug use.  Emotional well-being.  Home and relationship well-being.  Sexual activity.  Eating habits.  History of falls.  Memory and ability to understand (cognition).  Work and work Statistician. Screening  You may have the following tests or measurements:  Height, weight, and BMI.  Blood pressure.  Lipid and cholesterol levels. These may be checked every 5 years, or more frequently if you are over 57 years old.  Skin check.  Lung cancer screening. You may have this screening every year starting at age 43 if you have a 30-pack-year history of smoking and currently smoke or have quit within the past 15 years.  Fecal occult blood test (FOBT) of the stool. You may have this test every year starting at age 73.  Flexible sigmoidoscopy or colonoscopy. You may have a sigmoidoscopy every 5 years or a colonoscopy every 10 years starting at age 39.  Prostate cancer screening. Recommendations will vary depending on your family history and other risks.  Hepatitis C blood test.  Hepatitis B blood test.  Sexually transmitted disease (STD) testing.  Diabetes screening. This is done by checking your blood sugar (glucose) after you have not eaten for a while (fasting). You may have this done every 1-3 years.  Abdominal aortic aneurysm (AAA) screening. You may need this if you are a current or former smoker.  Osteoporosis. You may be screened starting at age 50 if you are at high risk. Talk with your health care provider about your test results, treatment options, and if necessary, the need for more tests. Vaccines  Your health care provider may recommend certain vaccines, such as:  Influenza vaccine. This is  recommended every year.  Tetanus, diphtheria, and acellular pertussis (Tdap, Td) vaccine. You may need a Td booster every 10 years.  Zoster vaccine. You may need this after age 68.  Pneumococcal 13-valent conjugate  (PCV13) vaccine. One dose is recommended after age 63.  Pneumococcal polysaccharide (PPSV23) vaccine. One dose is recommended after age 9. Talk to your health care provider about which screenings and vaccines you need and how often you need them. This information is not intended to replace advice given to you by your health care provider. Make sure you discuss any questions you have with your health care provider. Document Released: 02/04/2015 Document Revised: 09/28/2015 Document Reviewed: 11/09/2014 Elsevier Interactive Patient Education  2017 [place] Prevention in the Home Falls can cause injuries. They can happen to people of all ages. There are many things you can do to make your home safe and to help prevent falls. What can I do on the outside of my home?  Regularly fix the edges of walkways and driveways and fix any cracks.  Remove anything that might make you trip as you walk through a door, such as a raised step or threshold.  Trim any bushes or trees on the path to your home.  Use bright outdoor lighting.  Clear any walking paths of anything that might make someone trip, such as rocks or tools.  Regularly check to see if handrails are loose or broken. Make sure that both sides of any steps have handrails.  Any raised decks and porches should have guardrails on the edges.  Have any leaves, snow, or ice cleared regularly.  Use sand or salt on walking paths during winter.  Clean up any spills in your garage right away. This includes oil or grease spills. What can I do in the bathroom?  Use night lights.  Install grab bars by the toilet and in the tub and shower. Do not use towel bars as grab bars.  Use non-skid mats or decals in the tub or shower.  If you need to sit down in the shower, use a plastic, non-slip stool.  Keep the floor dry. Clean up any water that spills on the floor as soon as it happens.  Remove soap buildup in the tub or shower  regularly.  Attach bath mats securely with double-sided non-slip rug tape.  Do not have throw rugs and other things on the floor that can make you trip. What can I do in the bedroom?  Use night lights.  Make sure that you have a light by your bed that is easy to reach.  Do not use any sheets or blankets that are too big for your bed. They should not hang down onto the floor.  Have a firm chair that has side arms. You can use this for support while you get dressed.  Do not have throw rugs and other things on the floor that can make you trip. What can I do in the kitchen?  Clean up any spills right away.  Avoid walking on wet floors.  Keep items that you use a lot in easy-to-reach places.  If you need to reach something above you, use a strong step stool that has a grab bar.  Keep electrical cords out of the way.  Do not use floor polish or wax that makes floors slippery. If you must use wax, use non-skid floor wax.  Do not have throw rugs and other things on the floor that can make you trip.  What can I do with my stairs?  Do not leave any items on the stairs.  Make sure that there are handrails on both sides of the stairs and use them. Fix handrails that are broken or loose. Make sure that handrails are as long as the stairways.  Check any carpeting to make sure that it is firmly attached to the stairs. Fix any carpet that is loose or worn.  Avoid having throw rugs at the top or bottom of the stairs. If you do have throw rugs, attach them to the floor with carpet tape.  Make sure that you have a light switch at the top of the stairs and the bottom of the stairs. If you do not have them, ask someone to add them for you. What else can I do to help prevent falls?  Wear shoes that:  Do not have high heels.  Have rubber bottoms.  Are comfortable and fit you well.  Are closed at the toe. Do not wear sandals.  If you use a stepladder:  Make sure that it is fully  opened. Do not climb a closed stepladder.  Make sure that both sides of the stepladder are locked into place.  Ask someone to hold it for you, if possible.  Clearly mark and make sure that you can see:  Any grab bars or handrails.  First and last steps.  Where the edge of each step is.  Use tools that help you move around (mobility aids) if they are needed. These include:  Canes.  Walkers.  Scooters.  Crutches.  Turn on the lights when you go into a dark area. Replace any light bulbs as soon as they burn out.  Set up your furniture so you have a clear path. Avoid moving your furniture around.  If any of your floors are uneven, fix them.  If there are any pets around you, be aware of where they are.  Review your medicines with your doctor. Some medicines can make you feel dizzy. This can increase your chance of falling. Ask your doctor what other things that you can do to help prevent falls. This information is not intended to replace advice given to you by your health care provider. Make sure you discuss any questions you have with your health care provider. Document Released: 11/04/2008 Document Revised: 06/16/2015 Document Reviewed: 02/12/2014 Elsevier Interactive Patient Education  2017 [employer].

## 2020-04-12 NOTE — Progress Notes (Signed)
I connected with Robert Greer today by telephone and verified that I am speaking with the correct person using two identifiers. Location patient: home Location provider: work Persons participating in the virtual visit: Sultan Herschberger, Glenna Durand LPN.   I discussed the limitations, risks, security and privacy concerns of performing an evaluation and management service by telephone and the availability of in person appointments. I also discussed with the patient that there may be a patient responsible charge related to this service. The patient expressed understanding and verbally consented to this telephonic visit.    Interactive audio and video telecommunications were attempted between this provider and patient, however failed, due to patient having technical difficulties OR patient did not have access to video capability.  We continued and completed visit with audio only.     Vital signs may be patient reported or missing.  Subjective:   Robert Greer is a 65 y.o. male who presents for Medicare Annual/Subsequent preventive examination.  Review of Systems     Cardiac Risk Factors include: advanced age (>23men, >61 women);dyslipidemia;hypertension;obesity (BMI >30kg/m2);sedentary lifestyle;smoking/ tobacco exposure     Objective:    Today's Vitals   04/12/20 1327  Weight: 265 lb (120.2 kg)  Height: 6' (1.829 m)   Body mass index is 35.94 kg/m.  Advanced Directives 04/12/2020 08/21/2018 06/25/2018 06/06/2018 08/19/2017 05/09/2017 02/08/2017  Does Patient Have a Medical Advance Directive? Yes Yes No Yes Yes Yes Yes  Type of Paramedic of [place];Living will [place];Living will - - - [place];Living will Living will;Healthcare Power of Attorney  Does patient want to make changes to medical advance directive? - Yes (ED - Information included in AVS) - - - - -  Copy of [place] in Chart? No - copy requested No -  copy requested - - - - No - copy requested    Current Medications (verified) Outpatient Encounter Medications as of 04/12/2020  Medication Sig  . allopurinol (ZYLOPRIM) 100 MG tablet Take 1 tablet (100 mg total) by mouth daily.  Marland Kitchen aspirin 81 MG chewable tablet Chew 1 tablet (81 mg total) by mouth daily.  Marland Kitchen atorvastatin (LIPITOR) 20 MG tablet Take 1 tablet (20 mg total) by mouth daily.  . cetirizine (ZYRTEC) 10 MG tablet Take 1 tablet (10 mg total) by mouth as needed for allergies. Reported on 02/09/2015  . clopidogrel (PLAVIX) 75 MG tablet Take 1 tablet (75 mg total) by mouth daily.  . diclofenac (VOLTAREN) 75 MG EC tablet Take 1 tablet (75 mg total) by mouth 2 (two) times daily.  . DULoxetine (CYMBALTA) 60 MG capsule Take 1 capsule (60 mg total) by mouth 2 (two) times daily.  Marland Kitchen ezetimibe (ZETIA) 10 MG tablet Take 1 tablet (10 mg total) by mouth daily.  Marland Kitchen oxyCODONE-acetaminophen (PERCOCET) 10-325 MG tablet Take 1 tablet by mouth daily as needed for pain.   . pregabalin (LYRICA) 75 MG capsule Take 75 mg by mouth 2 (two) times daily.  Marland Kitchen triamcinolone (NASACORT) 55 MCG/ACT AERO nasal inhaler Place 2 sprays into the nose daily.  . valsartan (DIOVAN) 160 MG tablet Take 0.5 tablets (80 mg total) by mouth daily.  Marland Kitchen doxycycline (VIBRA-TABS) 100 MG tablet Take 1 tablet (100 mg total) by mouth 2 (two) times daily. For 10 days. Take with full glass of water, stay upright 30 min after taking. (Patient not taking: Reported on 04/12/2020)   No facility-administered encounter medications on file as of 04/12/2020.  Allergies (verified) Buspirone and Levaquin [levofloxacin]   History: Past Medical History:  Diagnosis Date  . Anxiety   . Arthritis   . Backache, unspecified   . CAD (coronary artery disease)    a. 12/2014 NSTEMI/PCI: LM nl, LAD 85p/m (2.5x22 Resolute Integrity DES), D1 50ost, D2/3 small/nl, LCX 60d, OM1 nl, OM2 min irregs, OM3 nl, RCA 165m (likely culprilt w/ L->R, R->R collats-->Med Rx).  Nl EF.  Marland Kitchen Depressive disorder, not elsewhere classified   . Gout   . History of echocardiogram    a. 12/2014 Echo: EF 60-65%, no rwma, mild MR. Nl RV fxn.  . Hyperlipidemia   . Hypertension   . Lipoma of back 02/09/2015  . Measles   . Mobitz (type) II atrioventricular block   . Mumps   . Other hemochromatosis 12/05/2016  . Sleep apnea    Past Surgical History:  Procedure Laterality Date  . ANTERIOR FUSION LUMBAR SPINE    . BACK SURGERY  2012   x2  . CARDIAC CATHETERIZATION N/A 01/10/2015   Procedure: Left Heart Cath and Coronary Angiography;  Surgeon: Wellington Hampshire, MD;  Location: [place] [facility];  Service: Cardiovascular;  Laterality: N/A;  . CARDIAC CATHETERIZATION N/A 01/10/2015   Procedure: Coronary Stent Intervention;  Surgeon: Wellington Hampshire, MD;  Location: [place] [facility];  Service: Cardiovascular;  Laterality: N/A;  . COLONOSCOPY  2005  . COLONOSCOPY WITH PROPOFOL N/A 02/14/2017   Procedure: COLONOSCOPY WITH PROPOFOL;  Surgeon: [name], MD;  Location: [facility];  Service: Gastroenterology;  Laterality: N/A;  . ESOPHAGOGASTRODUODENOSCOPY (EGD) WITH PROPOFOL N/A 02/14/2017   Procedure: ESOPHAGOGASTRODUODENOSCOPY (EGD) WITH PROPOFOL;  Surgeon: [name], MD;  Location: [facility] ENDOSCOPY;  Service: Gastroenterology;  Laterality: N/A;  . KNEE SURGERY     bilateral  . TONSILLECTOMY AND ADENOIDECTOMY    . TOTAL SHOULDER REPLACEMENT Right    Family History  Problem Relation Age of Onset  . Stroke Mother   . Liver disease Father   . Lung cancer Sister   . Diabetes Sister   . Liver disease Brother   . Lung cancer Paternal Grandmother    Social History   Socioeconomic History  . Marital status: Married    Spouse name: Not on file  . Number of children: Not on file  . Years of education: Not on file  . Highest education level: Not on file  Occupational History  . Not on file  Tobacco Use  . Smoking status: Never Smoker  . Smokeless tobacco:  Current User    Types: Snuff  Vaping Use  . Vaping Use: Never used  Substance and Sexual Activity  . Alcohol use: Yes    Alcohol/week: 2.0 - 3.0 standard drinks    Types: 2 - 3 Cans of beer per week    Comment: ocassionally  . Drug use: No  . Sexual activity: Yes  Other Topics Concern  . Not on file  Social History Narrative  . Not on file   Social Determinants of Health   Financial Resource Strain: Low Risk   . Difficulty of Paying Living Expenses: Not hard at all  Food Insecurity: No Food Insecurity  . Worried About Charity fundraiser in the Last Year: Never true  . Ran Out of Food in the Last Year: Never true  Transportation Needs: No Transportation Needs  . Lack of Transportation (Medical): No  . Lack of Transportation (Non-Medical): No  Physical Activity: Inactive  . Days of Exercise per Week:  0 days  . Minutes of Exercise per Session: 0 min  Stress: No Stress Concern Present  . Feeling of Stress : Not at all  Social Connections: Not on file    Tobacco Counseling Ready to quit: Not Answered Counseling given: Not Answered   Clinical Intake:  Pre-visit preparation completed: Yes  Pain : No/denies pain     Nutritional Status: BMI > 30  Obese Nutritional Risks: None Diabetes: No  How often do you need to have someone help you when you read instructions, pamphlets, or other written materials from your doctor or pharmacy?: 1 - Never What is the last grade level you completed in school?: 1yrs college  Diabetic? no  Interpreter Needed?: No  Information entered by :: NAllen LPN   Activities of Daily Living In your present state of health, do you have any difficulty performing the following activities: 04/12/2020  Hearing? N  Vision? N  Difficulty concentrating or making decisions? N  Walking or climbing stairs? N  Dressing or bathing? N  Doing errands, shopping? N  Preparing Food and eating ? N  Using the Toilet? N  In the past six months, have you  accidently leaked urine? N  Do you have problems with loss of bowel control? N  Managing your Medications? N  Managing your Finances? N  Housekeeping or managing your Housekeeping? N  Some recent data might be hidden    Patient Care Team: Malfi, Lupita Raider, FNP as PCP - General (Family Medicine) Rockey Situ Kathlene November, MD as PCP - Cardiology (Cardiology) Dhalla, Virl Diamond, [facility] as Pharmacist Dhalla, Virl Diamond, [facility] (Pharmacist)  Indicate any recent Medical Services you may have received from other than [facility] providers in the past year (date may be approximate).     Assessment:   This is a routine wellness examination for Mavrik.  Hearing/Vision screen  Hearing Screening   125Hz  250Hz  500Hz  1000Hz  2000Hz  3000Hz  4000Hz  6000Hz  8000Hz   Right ear:           Left ear:           Vision Screening Comments: Regular eye exams, Dr. Ellin Mayhew  Dietary issues and exercise activities discussed: Current Exercise Habits: The patient does not participate in regular exercise at present  Goals    .  Acknowledge receipt of Advanced Directive package     Bring documents to clinic to place on file    . Obtain Annual Eye Exam    . Patient Stated     04/12/2020, wants to weigh 225 pounds    . PharmD - Medication Assistance     CARE PLAN ENTRY (see longitudinal plan of care for additional care plan information)  Current Barriers:  . Financial Barriers in complicated patient with multiple medical conditions including CAD, HTN, HLD, arthritis, sleep apnea, and anxiety  patient has [employer] PDP Part D plan and reports copay for Zetia is cost prohibitive at this time  Pharmacist Clinical Goal(s):  Marland Kitchen Over the next 30 days, patient will work with PharmD and providers to relieve medication access concerns  Interventions: . Counseled on Medicare Part D plan and medication assistance options . Zetia: Patient reported meeting income criteria for [place]  Hypercholesterolemia Fund, which is currently Open o Again provide patient with contact information for [employer] . Schedule appointment with patient to complete medication review and follow up regarding medication assistance  o Patient unable to continue conversation today as he is at work. Bertram Savin care  team collaboration (see longitudinal plan of care)  Patient Self Care Activities:  . Patient will provide necessary portions of application   Please see past updates related to this goal by clicking on the "Past Updates" button in the selected goal        Depression Screen PHQ 2/9 Scores 04/12/2020 04/22/2019 09/11/2018 08/21/2018 07/15/2018 03/11/2018 02/20/2018  PHQ - 2 Score 0 0 0 1 0 0 1  PHQ- 9 Score - 0 0 2 1 0 1    Fall Risk Fall Risk  04/12/2020 01/01/2020 02/20/2018 06/30/2014  Falls in the past year? 1 0 0 Yes  Comment tripped - - -  Number falls in past yr: 1 0 0 1  Injury with Fall? 0 0 - No  Risk for fall due to : Medication side effect No Fall Risks - -  Follow up Falls evaluation completed;Education provided;Falls prevention discussed Falls evaluation completed Falls evaluation completed -    FALL RISK PREVENTION PERTAINING TO THE HOME:  Any stairs in or around the home? Yes  If so, are there any without handrails? Yes  Home free of loose throw rugs in walkways, pet beds, electrical cords, etc? Yes  Adequate lighting in your home to reduce risk of falls? Yes   ASSISTIVE DEVICES UTILIZED TO PREVENT FALLS:  Life alert? No  Use of a cane, walker or w/c? No  Grab bars in the bathroom? No  Shower chair or bench in shower? No  Elevated toilet seat or a handicapped toilet? Yes   TIMED UP AND GO:  Was the test performed? No .   Cognitive Function:     6CIT Screen 04/12/2020 08/21/2018  What Year? 0 points 0 points  What month? 0 points 0 points  What time? 0 points 0 points  Count back from 20 0 points 0 points  Months in reverse 0 points 2  points  Repeat phrase 2 points 0 points  Total Score 2 2    Immunizations Immunization History  Administered Date(s) Administered  . Fluad Quad(high Dose 65+) 01/01/2020  . Influenza,inj,Quad PF,6+ Mos 01/01/2018  . Influenza,inj,quad, With Preservative 01/03/2014  . Influenza-Unspecified 12/17/2014, 11/03/2015, 01/01/2018  . Moderna Sars-Covid-2 Vaccination 03/06/2019, 04/03/2019  . Pneumococcal Conjugate-13 08/21/2018  . Tdap 07/02/2017, 02/04/2018  . Zoster 05/02/2015    TDAP status: Up to date  Flu Vaccine status: Up to date  Pneumococcal vaccine status: Due, Education has been provided regarding the importance of this vaccine. Advised may receive this vaccine at local pharmacy or Health Dept. Aware to provide a copy of the vaccination record if obtained from local pharmacy or Health Dept. Verbalized acceptance and understanding.  Covid-19 vaccine status: Completed vaccines  Qualifies for Shingles Vaccine? Yes   Zostavax completed Yes   Shingrix Completed?: No.    Education has been provided regarding the importance of this vaccine. Patient has been advised to call insurance company to determine out of pocket expense if they have not yet received this vaccine. Advised may also receive vaccine at local pharmacy or Health Dept. Verbalized acceptance and understanding.  Screening Tests Health Maintenance  Topic Date Due  . COVID-19 Vaccine (3 - Moderna risk 4-dose series) 05/01/2019  . PNA vac Low Risk Adult (2 of 2 - PPSV23) 08/21/2019  . COLONOSCOPY (Pts 45-85yrs Insurance coverage will need to be confirmed)  02/15/2027  . TETANUS/TDAP  02/05/2028  . INFLUENZA VACCINE  Completed  . Hepatitis C Screening  Completed  . HPV VACCINES  Aged Out  Health Maintenance  Health Maintenance Due  Topic Date Due  . COVID-19 Vaccine (3 - Moderna risk 4-dose series) 05/01/2019  . PNA vac Low Risk Adult (2 of 2 - PPSV23) 08/21/2019    Colorectal cancer screening: Type of  screening: Colonoscopy. Completed 02/14/2017. Repeat every 10 years  Lung Cancer Screening: (Low Dose CT Chest recommended if Age 45-80 years, 30 pack-year currently smoking OR have quit w/in 15years.) does not qualify.   Lung Cancer Screening Referral: no  Additional Screening:  Hepatitis C Screening: does qualify; Completed 09/16/2017  Vision Screening: Recommended annual ophthalmology exams for early detection of glaucoma and other disorders of the eye. Is the patient up to date with their annual eye exam?  Yes  Who is the provider or what is the name of the office in which the patient attends annual eye exams? Dr. Ellin Mayhew If pt is not established with a provider, would they like to be referred to a provider to establish care? No .   Dental Screening: Recommended annual dental exams for proper oral hygiene  Community Resource Referral / Chronic Care Management: CRR required this visit?  No   CCM required this visit?  No      Plan:     I have personally reviewed and noted the following in the patient's chart:   . Medical and social history . Use of alcohol, tobacco or illicit drugs  . Current medications and supplements . Functional ability and status . Nutritional status . Physical activity . Advanced directives . List of other physicians . Hospitalizations, surgeries, and ER visits in previous 12 months . Vitals . Screenings to include cognitive, depression, and falls . Referrals and appointments  In addition, I have reviewed and discussed with patient certain preventive protocols, quality metrics, and best practice recommendations. A written personalized care plan for preventive services as well as general preventive health recommendations were provided to patient.     Kellie Simmering, LPN   [id number]   Nurse Notes:

## 2020-04-20 IMAGING — CR RIGHT TIBIA AND FIBULA - 2 VIEW
4 series · 4 of 4 positions shown · non-contrast
Comparison: None.

CLINICAL DATA: Right leg pain after injury.

EXAM:
RIGHT TIBIA AND FIBULA - 2 VIEW

[tibia ap (1 of 2)]
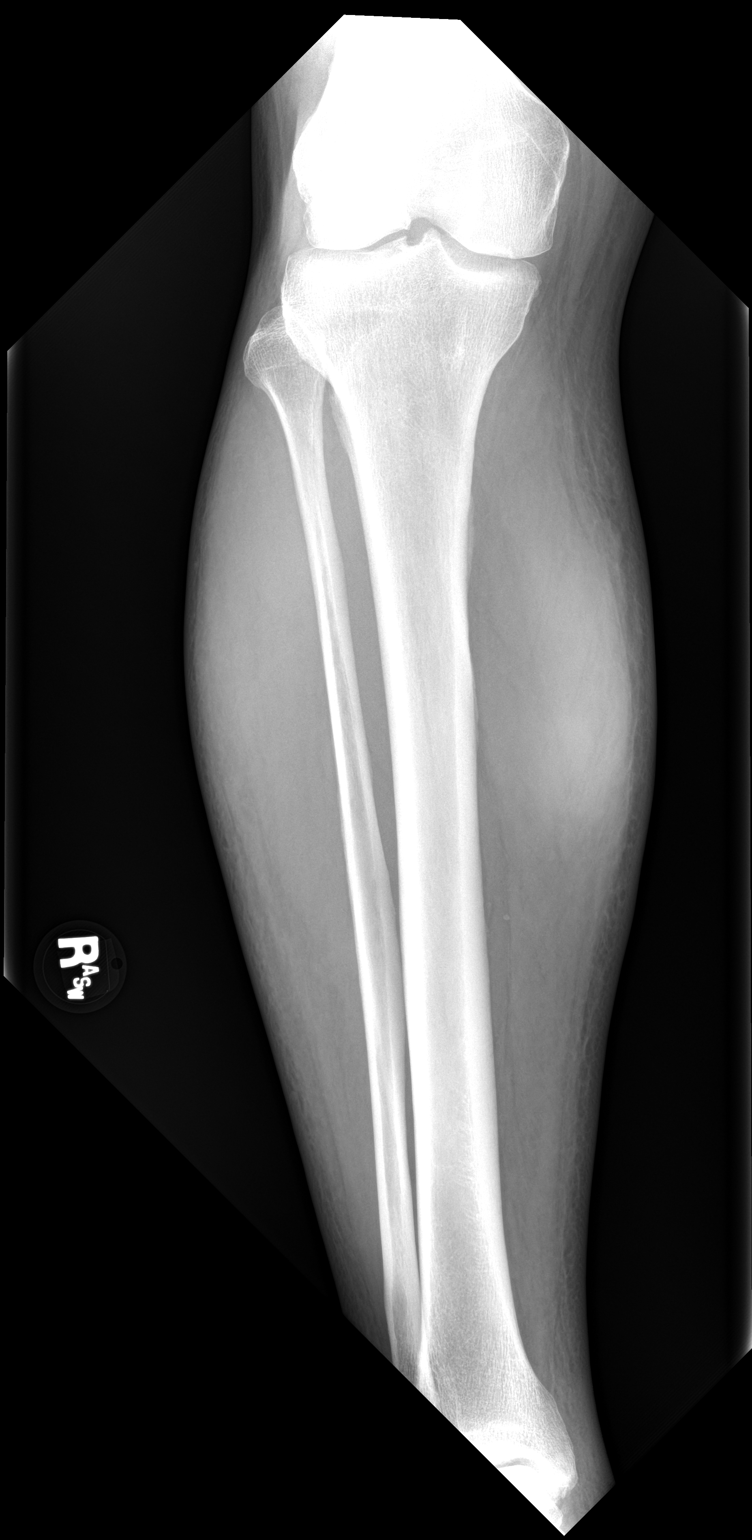

[tibia ap (2 of 2)]
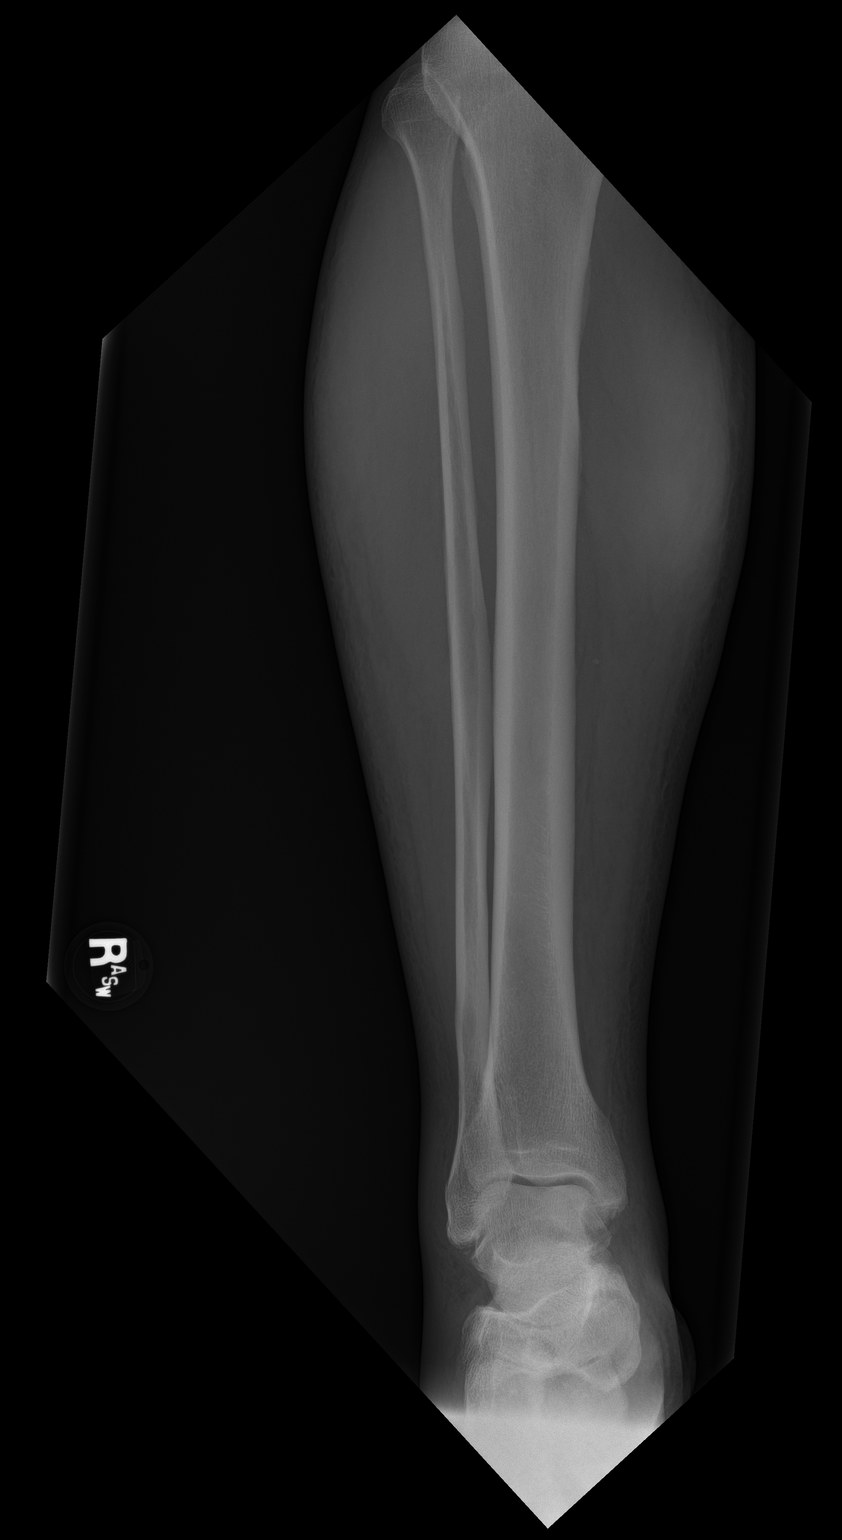

[tibia lat (1 of 2)]
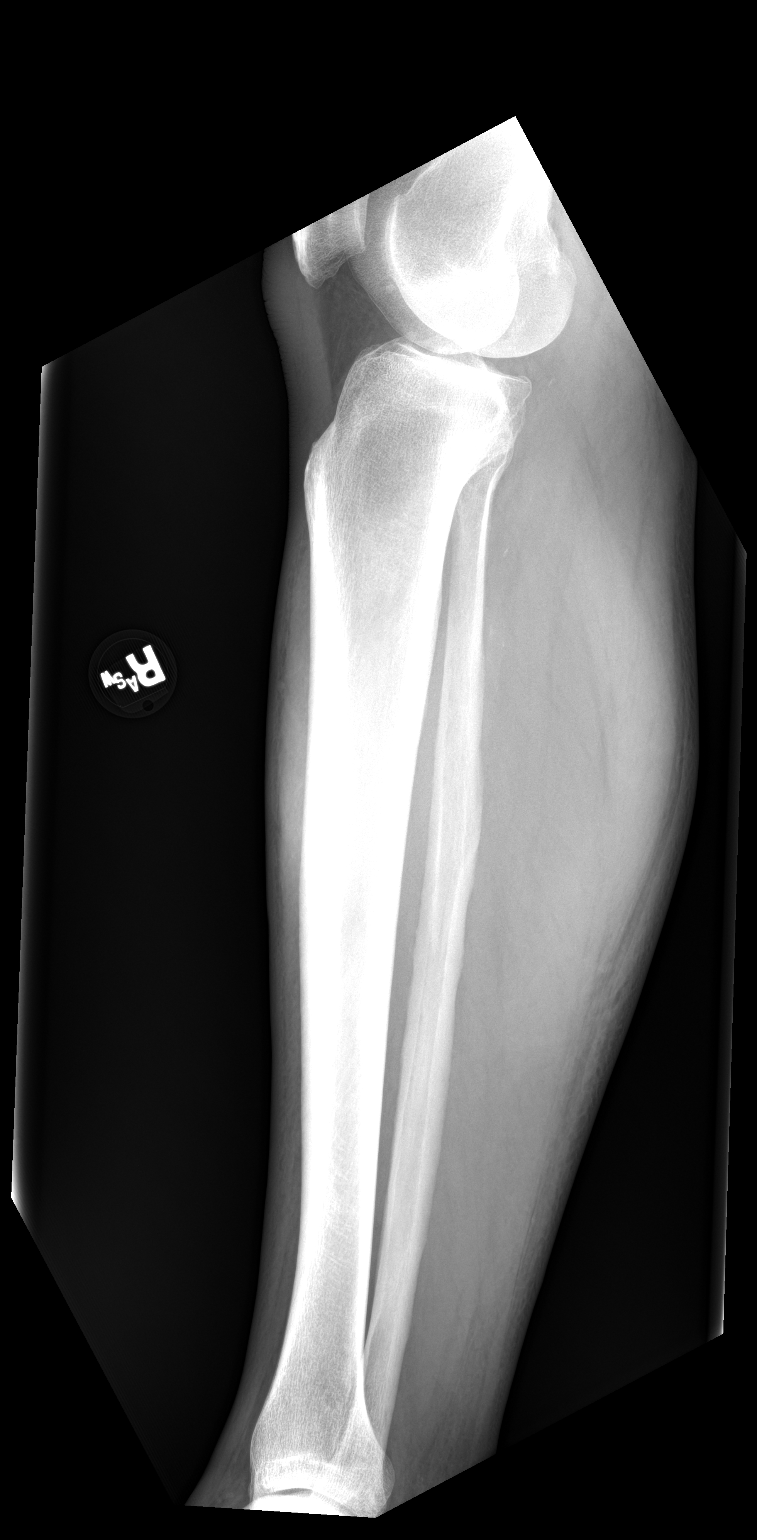

[tibia lat (2 of 2)]
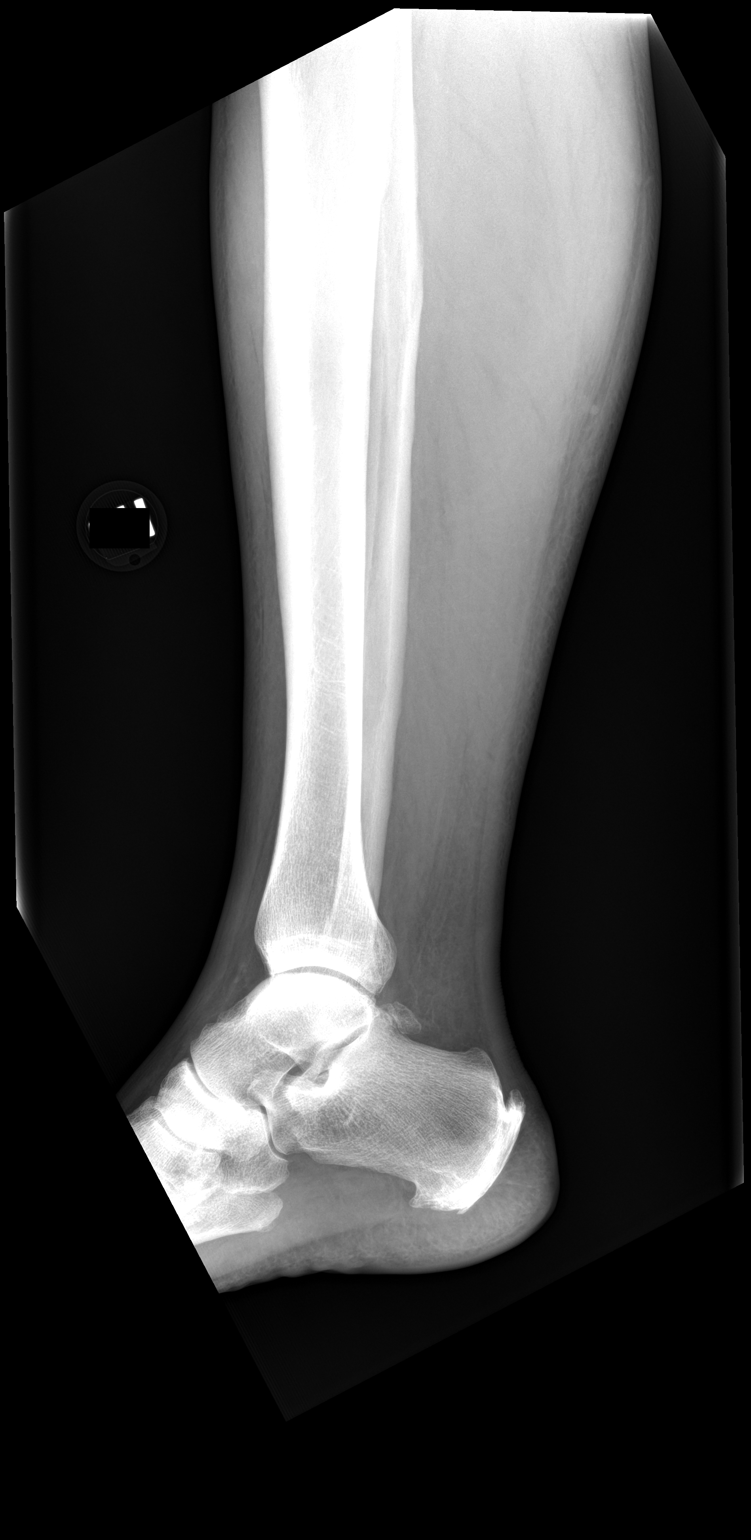

[4 of 4 positions shown; findings below may reference images not displayed]

FINDINGS: There is no evidence of fracture or other focal bone lesions. Soft
tissues are unremarkable.
IMPRESSION: Negative.

## 2020-05-17 DIAGNOSIS — E669 Obesity, unspecified: Secondary | ICD-10-CM | POA: Diagnosis not present

## 2020-05-17 DIAGNOSIS — Z79899 Other long term (current) drug therapy: Secondary | ICD-10-CM | POA: Diagnosis not present

## 2020-05-17 DIAGNOSIS — Z6830 Body mass index (BMI) 30.0-30.9, adult: Secondary | ICD-10-CM | POA: Diagnosis not present

## 2020-05-17 DIAGNOSIS — I10 Essential (primary) hypertension: Secondary | ICD-10-CM | POA: Diagnosis not present

## 2020-05-17 DIAGNOSIS — G4730 Sleep apnea, unspecified: Secondary | ICD-10-CM | POA: Diagnosis not present

## 2020-05-17 DIAGNOSIS — E785 Hyperlipidemia, unspecified: Secondary | ICD-10-CM | POA: Diagnosis not present

## 2020-05-17 DIAGNOSIS — Z01818 Encounter for other preprocedural examination: Secondary | ICD-10-CM | POA: Diagnosis not present

## 2020-05-17 DIAGNOSIS — M109 Gout, unspecified: Secondary | ICD-10-CM | POA: Diagnosis not present

## 2020-05-17 DIAGNOSIS — Z7901 Long term (current) use of anticoagulants: Secondary | ICD-10-CM | POA: Diagnosis not present

## 2020-05-17 DIAGNOSIS — I2510 Atherosclerotic heart disease of native coronary artery without angina pectoris: Secondary | ICD-10-CM | POA: Diagnosis not present

## 2020-05-17 DIAGNOSIS — G894 Chronic pain syndrome: Secondary | ICD-10-CM | POA: Diagnosis not present

## 2020-05-17 DIAGNOSIS — T8859XD Other complications of anesthesia, subsequent encounter: Secondary | ICD-10-CM | POA: Diagnosis not present

## 2020-05-17 DIAGNOSIS — Z981 Arthrodesis status: Secondary | ICD-10-CM | POA: Diagnosis not present

## 2020-05-17 DIAGNOSIS — M961 Postlaminectomy syndrome, not elsewhere classified: Secondary | ICD-10-CM | POA: Diagnosis not present

## 2020-05-17 DIAGNOSIS — K219 Gastro-esophageal reflux disease without esophagitis: Secondary | ICD-10-CM | POA: Diagnosis not present

## 2020-05-17 DIAGNOSIS — Z9689 Presence of other specified functional implants: Secondary | ICD-10-CM | POA: Diagnosis not present

## 2020-05-30 ENCOUNTER — Telehealth: Payer: Self-pay | Admitting: Family Medicine

## 2020-05-30 NOTE — Telephone Encounter (Signed)
Medication Refill - Medication: diclofenac (VOLTAREN) 75 MG EC tablet (patient states he is out and would like request expedited)   Has the patient contacted their pharmacy? Yes. Pharmacy advised to contact PCP office due to several attempts and no response . Preferred Pharmacy (with phone number or street name):  [place], [place] Phone:  [phone]  Fax:  [phone]       Agent: Please be advised that RX refills may take up to 3 business days. We ask that you follow-up with your pharmacy.

## 2020-05-31 NOTE — Telephone Encounter (Signed)
Refilled denied. He should not be taking Diclofenac if he is taking Plavix due to increased risk of bleeding

## 2020-06-01 NOTE — Telephone Encounter (Signed)
Pt is calling and has sent mychart messages and would like kelita to return his call concerning diclofenac and plavix

## 2020-06-02 NOTE — Telephone Encounter (Signed)
I am not comfortable with him taking it while on Plavix. If his cardiologist wants to prescribe it then that's fine but I am not going to fill it due to increased risk for bleeding.

## 2020-06-02 NOTE — Telephone Encounter (Signed)
Please see phone note addressing this same issue

## 2020-06-03 ENCOUNTER — Telehealth: Payer: Self-pay | Admitting: Cardiovascular Disease

## 2020-06-03 NOTE — Telephone Encounter (Signed)
Pt c/o medication issue:  1. Name of Medication: diclofenac    2. How are you currently taking this medication (dosage and times per [place])?  3. Are you having a reaction (difficulty breathing--STAT)? no  4. What is your medication issue? pcp will not refill until cardiology gives clearance that its ok to take with plavix.  Please call.

## 2020-06-06 ENCOUNTER — Ambulatory Visit: Payer: Medicare Other | Admitting: Internal Medicine

## 2020-06-06 NOTE — Progress Notes (Deleted)
Subjective:    Patient ID: Robert Greer, male    DOB: 1955-08-23, 64 y.o.   MRN: [id number]  HPI  Pt presents to the clinic today for follow up of chronic conditions. He is establishing care with me today, transferring care from Cyndia Skeeters, NP.  HTN: His BP today is. He is taking Valsartan as prescribed. ECG from 11/2019 reviewed.  HLD with CAD s/p MI: s/p stents. His last LDL was 65, triglycerides 93, 01/2020. He denies myalgias on Atorvastatin and Ezetimibe. He is taking ASA and Plavix as well. He tries to consume a low fat diet.  Gout: He denies recent flare on Allopurinol. He does not follow with rheumatology.  OA with Chronic Low Back Pain: Mainly in his. He was taking Duloxetine and Diclofenac as prescribed until I refused to refill this as he was taking ASA and Plavix.  Anxiety and Depression: Chronic, managed on Duloxetine. He is not currently seeing a therapist. He denies SI/HI.  Hematochromatosis: His last H/H was 14.7/43.9, 01/2020. Iron levels and stores at that time were normal.   Review of Systems      Past Medical History:  Diagnosis Date  . Anxiety   . Arthritis   . Backache, unspecified   . CAD (coronary artery disease)    a. 12/2014 NSTEMI/PCI: LM nl, LAD 85p/m (2.5x22 Resolute Integrity DES), D1 50ost, D2/3 small/nl, LCX 60d, OM1 nl, OM2 min irregs, OM3 nl, RCA 163m (likely culprilt w/ L->R, R->R collats-->Med Rx). Nl EF.  Marland Kitchen Depressive disorder, not elsewhere classified   . Gout   . History of echocardiogram    a. 12/2014 Echo: EF 60-65%, no rwma, mild MR. Nl RV fxn.  . Hyperlipidemia   . Hypertension   . Lipoma of back 02/09/2015  . Measles   . Mobitz (type) II atrioventricular block   . Mumps   . Other hemochromatosis 12/05/2016  . Sleep apnea     Current Outpatient Medications  Medication Sig Dispense Refill  . allopurinol (ZYLOPRIM) 100 MG tablet Take 1 tablet (100 mg total) by mouth daily. 90 tablet 0  . aspirin 81 MG chewable tablet Chew 1  tablet (81 mg total) by mouth daily. 30 tablet 3  . atorvastatin (LIPITOR) 20 MG tablet Take 1 tablet (20 mg total) by mouth daily. 90 tablet 2  . cetirizine (ZYRTEC) 10 MG tablet Take 1 tablet (10 mg total) by mouth as needed for allergies. Reported on 02/09/2015 90 tablet 1  . clopidogrel (PLAVIX) 75 MG tablet Take 1 tablet (75 mg total) by mouth daily. 90 tablet 1  . diclofenac (VOLTAREN) 75 MG EC tablet Take 1 tablet (75 mg total) by mouth 2 (two) times daily. 180 tablet 2  . doxycycline (VIBRA-TABS) 100 MG tablet Take 1 tablet (100 mg total) by mouth 2 (two) times daily. For 10 days. Take with full glass of water, stay upright 30 min after taking. (Patient not taking: Reported on 04/12/2020) 20 tablet 0  . DULoxetine (CYMBALTA) 60 MG capsule Take 1 capsule (60 mg total) by mouth 2 (two) times daily. 180 capsule 1  . ezetimibe (ZETIA) 10 MG tablet Take 1 tablet (10 mg total) by mouth daily. 90 tablet 3  . oxyCODONE-acetaminophen (PERCOCET) 10-325 MG tablet Take 1 tablet by mouth daily as needed for pain.     . pregabalin (LYRICA) 75 MG capsule Take 75 mg by mouth 2 (two) times daily.    Marland Kitchen triamcinolone (NASACORT) 55 MCG/ACT AERO nasal inhaler Place 2 sprays  into the nose daily. 1 Inhaler 12  . valsartan (DIOVAN) 160 MG tablet Take 0.5 tablets (80 mg total) by mouth daily. 45 tablet 3   No current facility-administered medications for this visit.    Allergies  Allergen Reactions  . Buspirone Other (See Comments)    Disliked the way it made him feel Other reaction(s): Unknown Disliked the way it made him feel Disliked the way it made him feel  . Levaquin [Levofloxacin] Rash    Family History  Problem Relation Age of Onset  . Stroke Mother   . Liver disease Father   . Lung cancer Sister   . Diabetes Sister   . Liver disease Brother   . Lung cancer Paternal Grandmother     Social History   Socioeconomic History  . Marital status: Married    Spouse name: Not on file  . Number of  children: Not on file  . Years of education: Not on file  . Highest education level: Not on file  Occupational History  . Not on file  Tobacco Use  . Smoking status: Never Smoker  . Smokeless tobacco: Current User    Types: Snuff  Vaping Use  . Vaping Use: Never used  Substance and Sexual Activity  . Alcohol use: Yes    Alcohol/week: 2.0 - 3.0 standard drinks    Types: 2 - 3 Cans of beer per week    Comment: ocassionally  . Drug use: No  . Sexual activity: Yes  Other Topics Concern  . Not on file  Social History Narrative  . Not on file   Social Determinants of Health   Financial Resource Strain: Low Risk   . Difficulty of Paying Living Expenses: Not hard at all  Food Insecurity: No Food Insecurity  . Worried About Charity fundraiser in the Last Year: Never true  . Ran Out of Food in the Last Year: Never true  Transportation Needs: No Transportation Needs  . Lack of Transportation (Medical): No  . Lack of Transportation (Non-Medical): No  Physical Activity: Inactive  . Days of Exercise per Week: 0 days  . Minutes of Exercise per Session: 0 min  Stress: No Stress Concern Present  . Feeling of Stress : Not at all  Social Connections: Not on file  Intimate Partner Violence: Not on file     Constitutional: Denies fever, malaise, fatigue, headache or abrupt weight changes.  HEENT: Denies eye pain, eye redness, ear pain, ringing in the ears, wax buildup, runny nose, nasal congestion, bloody nose, or sore throat. Respiratory: Denies difficulty breathing, shortness of breath, cough or sputum production.   Cardiovascular: Denies chest pain, chest tightness, palpitations or swelling in the hands or feet.  Gastrointestinal: Denies abdominal pain, bloating, constipation, diarrhea or blood in the stool.  GU: Denies urgency, frequency, pain with urination, burning sensation, blood in urine, odor or discharge. Musculoskeletal: Pt reports chronic joint pain. Denies decrease in range  of motion, difficulty with gait, muscle pain or joint swelling.  Skin: Denies redness, rashes, lesions or ulcercations.  Neurological: Denies dizziness, difficulty with memory, difficulty with speech or problems with balance and coordination.  Psych: Pt has a history of anxiety and depression. Denies SI/HI.  No other specific complaints in a complete review of systems (except as listed in HPI above).  Objective:   Physical Exam   There were no vitals taken for this visit. Wt Readings from Last 3 Encounters:  04/12/20 265 lb (120.2 kg)  02/09/20 275  lb (124.7 kg)  01/27/20 274 lb (124.3 kg)    General: Appears their stated age, well developed, well nourished in NAD. Skin: Warm, dry and intact. No rashes, lesions or ulcerations noted. HEENT: Head: normal shape and size; Eyes: sclera white, no icterus, conjunctiva pink, PERRLA and EOMs intact; Ears: Tm's gray and intact, normal light reflex; Nose: mucosa pink and moist, septum midline; Throat/Mouth: Teeth present, mucosa pink and moist, no exudate, lesions or ulcerations noted.  Neck:  Neck supple, trachea midline. No masses, lumps or thyromegaly present.  Cardiovascular: Normal rate and rhythm. S1,S2 noted.  No murmur, rubs or gallops noted. No JVD or BLE edema. No carotid bruits noted. Pulmonary/Chest: Normal effort and positive vesicular breath sounds. No respiratory distress. No wheezes, rales or ronchi noted.  Abdomen: Soft and nontender. Normal bowel sounds. No distention or masses noted. Liver, spleen and kidneys non palpable. Musculoskeletal: Normal range of motion. No signs of joint swelling. No difficulty with gait.  Neurological: Alert and oriented. Cranial nerves II-XII grossly intact. Coordination normal.  Psychiatric: Mood and affect normal. Behavior is normal. Judgment and thought content normal.     BMET    Component Value Date/Time   NA 140 01/01/2020 1549   NA 141 02/02/2019 0834   K 4.3 01/01/2020 1549   CL 104  01/01/2020 1549   CO2 29 01/01/2020 1549   GLUCOSE 105 (H) 01/01/2020 1549   BUN 19 01/01/2020 1549   BUN 18 02/02/2019 0834   CREATININE 1.21 01/01/2020 1549   CALCIUM 9.7 01/01/2020 1549   GFRNONAA 62 01/01/2020 1549   GFRAA 72 01/01/2020 1549    Lipid Panel     Component Value Date/Time   CHOL 149 02/02/2019 0834   TRIG 93 02/02/2019 0834   HDL 67 02/02/2019 0834   CHOLHDL 2.2 02/02/2019 0834   CHOLHDL 3.1 01/08/2015 0225   VLDL 23 01/08/2015 0225   LDLCALC 65 02/02/2019 0834    CBC    Component Value Date/Time   WBC 7.4 01/01/2020 1549   RBC 4.77 01/01/2020 1549   HGB 15.2 01/01/2020 1549   HCT 43.6 01/01/2020 1549   PLT 292 01/01/2020 1549   MCV 91.4 01/01/2020 1549   MCH 31.9 01/01/2020 1549   MCHC 34.9 01/01/2020 1549   RDW 12.2 01/01/2020 1549   LYMPHSABS 1,554 01/01/2020 1549   MONOABS 0.6 08/27/2018 1501   EOSABS 333 01/01/2020 1549   BASOSABS 37 01/01/2020 1549    Hgb A1C Lab Results  Component Value Date   HGBA1C 5.7 (H) 12/04/2016           Assessment & Plan:    Webb Silversmith, NP This visit occurred during the SARS-CoV-2 public health emergency.  Safety protocols were in place, including screening questions prior to the visit, additional usage of staff PPE, and extensive cleaning of exam room while observing appropriate contact time as indicated for disinfecting solutions.

## 2020-06-07 NOTE — Telephone Encounter (Signed)
Would take diclofenac very sparingly Typically we do not encourage use of NSAIDs including ibuprofen Aleve Advil meloxicam diclofenac when on blood thinners If it is a joint could try Voltaren cream and Tylenol

## 2020-06-07 NOTE — Telephone Encounter (Signed)
I sent this response to the patient mychart.

## 2020-06-07 NOTE — Telephone Encounter (Signed)
Attempted to reach pt via phone, unable to make contact, LDM on VM (DPR approved). Advised on Dr. Donivan Scull suggestion of using diclofenac  Would take diclofenac very sparingly Typically we do not encourage use of NSAIDs including ibuprofen Aleve Advil meloxicam diclofenac when on blood thinners If it is a joint could try Voltaren cream and Tylenol  If any further questions or concerns to please call back

## 2020-08-30 ENCOUNTER — Other Ambulatory Visit: Payer: Self-pay | Admitting: Family Medicine

## 2020-08-30 DIAGNOSIS — F419 Anxiety disorder, unspecified: Secondary | ICD-10-CM

## 2020-08-30 NOTE — Telephone Encounter (Signed)
Requested Prescriptions  Pending Prescriptions Disp Refills  . DULoxetine (CYMBALTA) 60 MG capsule [Pharmacy Med Name: DULOXETINE HCL DR 60 MG CAP] 180 capsule 0    Sig: Take 1 capsule (60 mg total) by mouth 2 (two) times daily.     Psychiatry: Antidepressants - SNRI Passed - 08/30/2020  6:21 PM      Passed - Completed PHQ-2 or PHQ-9 in the last 360 days      Passed - Last BP in normal range    BP Readings from Last 1 Encounters:  01/27/20 119/64         Passed - Valid encounter within last 6 months    Recent Outpatient Visits          6 months ago Acute non-recurrent frontal sinusitis   [place], DO   7 months ago Encounter for Department of Transportation (DOT) examination for trucking license   [facility], Lupita Raider, FNP   8 months ago [place], FNP   1 year ago [place], Lupita Raider, FNP   1 year ago [place], FNP      Future Appointments            In 7 months [facility], [facility]

## 2020-09-12 ENCOUNTER — Other Ambulatory Visit: Payer: Self-pay | Admitting: Cardiovascular Disease

## 2020-09-20 ENCOUNTER — Ambulatory Visit: Payer: Medicare Other | Admitting: Internal Medicine

## 2020-09-20 NOTE — Progress Notes (Deleted)
Subjective:    Patient ID: Robert Greer, male    DOB: 06/25/1955, 65 y.o.   MRN: [id number]  HPI  Patient presents the clinic today for follow-up of chronic conditions.  He is establishing care with me today, transferring care from Lonie Peak, NP.  HTN: His BP today is.  He is taking Valsartan as prescribed.  ECG from 11/2019 reviewed.  HLD with CAD status post MI: His last LDL was 65, triglycerides 93, 01/2019.  He denies myalgias on Atorvastatin and Ezetimibe.  He is taking Aspirin and Plavix as well.  GERD: Triggered by.  He takes as needed with good relief of symptoms.  Upper GI from 01/2017 reviewed.  OA: Mainly in his back.  He was taking Diclofenac but I refused to refill this as he was on aspirin and Plavix.  He takes Pregabalin as well.  Gout: He denies recent gout flare on Allopurinol.  He does not follow with rheumatology.  Hemochromatosis: His last blood counts and iron levels were normal.  Anxiety and Depression: Chronic, managed on Duloxetine.  He is not currently seeing a therapist.  He denies SI/HI.  Prediabetes: His last A1c was 5.7%, 2018.  He is not currently taking any oral diabetic medication.  He does not check his sugars.  Review of Systems     Past Medical History:  Diagnosis Date   Anxiety    Arthritis    Backache, unspecified    CAD (coronary artery disease)    a. 12/2014 NSTEMI/PCI: LM nl, LAD 85p/m (2.5x22 Resolute Integrity DES), D1 50ost, D2/3 small/nl, LCX 60d, OM1 nl, OM2 min irregs, OM3 nl, RCA 161m(likely culprilt w/ L->R, R->R collats-->Med Rx). Nl EF.   Depressive disorder, not elsewhere classified    Gout    History of echocardiogram    a. 12/2014 Echo: EF 60-65%, no rwma, mild MR. Nl RV fxn.   Hyperlipidemia    Hypertension    Lipoma of back 02/09/2015   Measles    Mobitz (type) II atrioventricular block    Mumps    Other hemochromatosis 12/05/2016   Sleep apnea     Current Outpatient Medications  Medication Sig Dispense Refill    allopurinol (ZYLOPRIM) 100 MG tablet Take 1 tablet (100 mg total) by mouth daily. 90 tablet 0   aspirin 81 MG chewable tablet Chew 1 tablet (81 mg total) by mouth daily. 30 tablet 3   atorvastatin (LIPITOR) 20 MG tablet Take 1 tablet (20 mg total) by mouth daily. 90 tablet 2   cetirizine (ZYRTEC) 10 MG tablet Take 1 tablet (10 mg total) by mouth as needed for allergies. Reported on 02/09/2015 90 tablet 1   clopidogrel (PLAVIX) 75 MG tablet Take 1 tablet (75 mg total) by mouth daily. PLEASE CONTACT OFFICE TO SCHEDULE ANNUAL APPOINTMENT 90 tablet 0   diclofenac (VOLTAREN) 75 MG EC tablet Take 1 tablet (75 mg total) by mouth 2 (two) times daily. 180 tablet 2   doxycycline (VIBRA-TABS) 100 MG tablet Take 1 tablet (100 mg total) by mouth 2 (two) times daily. For 10 days. Take with full glass of water, stay upright 30 min after taking. (Patient not taking: Reported on 04/12/2020) 20 tablet 0   DULoxetine (CYMBALTA) 60 MG capsule Take 1 capsule (60 mg total) by mouth 2 (two) times daily. 180 capsule 0   ezetimibe (ZETIA) 10 MG tablet Take 1 tablet (10 mg total) by mouth daily. 90 tablet 3   oxyCODONE-acetaminophen (PERCOCET) 10-325 MG tablet Take 1  tablet by mouth daily as needed for pain.      pregabalin (LYRICA) 75 MG capsule Take 75 mg by mouth 2 (two) times daily.     triamcinolone (NASACORT) 55 MCG/ACT AERO nasal inhaler Place 2 sprays into the nose daily. 1 Inhaler 12   valsartan (DIOVAN) 160 MG tablet Take 0.5 tablets (80 mg total) by mouth daily. 45 tablet 3   No current facility-administered medications for this visit.    Allergies  Allergen Reactions   Buspirone Other (See Comments)    Disliked the way it made him feel Other reaction(s): Unknown Disliked the way it made him feel Disliked the way it made him feel   Levaquin [Levofloxacin] Rash    Family History  Problem Relation Age of Onset   Stroke Mother    Liver disease Father    Lung cancer Sister    Diabetes Sister    Liver  disease Brother    Lung cancer Paternal Grandmother     Social History   Socioeconomic History   Marital status: Married    Spouse name: Not on file   Number of children: Not on file   Years of education: Not on file   Highest education level: Not on file  Occupational History   Not on file  Tobacco Use   Smoking status: Never   Smokeless tobacco: Current    Types: Snuff  Vaping Use   Vaping Use: Never used  Substance and Sexual Activity   Alcohol use: Yes    Alcohol/week: 2.0 - 3.0 standard drinks    Types: 2 - 3 Cans of beer per week    Comment: ocassionally   Drug use: No   Sexual activity: Yes  Other Topics Concern   Not on file  Social History Narrative   Not on file   Social Determinants of Health   Financial Resource Strain: Low Risk    Difficulty of Paying Living Expenses: Not hard at all  Food Insecurity: No Food Insecurity   Worried About Charity fundraiser in the Last Year: Never true   [place] in the Last Year: Never true  Transportation Needs: No Transportation Needs   Lack of Transportation (Medical): No   Lack of Transportation (Non-Medical): No  Physical Activity: Inactive   Days of Exercise per Week: 0 days   Minutes of Exercise per Session: 0 min  Stress: No Stress Concern Present   Feeling of Stress : Not at all  Social Connections: Not on file  Intimate Partner Violence: Not on file     Constitutional: Denies fever, malaise, fatigue, headache or abrupt weight changes.  HEENT: Denies eye pain, eye redness, ear pain, ringing in the ears, wax buildup, runny nose, nasal congestion, bloody nose, or sore throat. Respiratory: Denies difficulty breathing, shortness of breath, cough or sputum production.   Cardiovascular: Denies chest pain, chest tightness, palpitations or swelling in the hands or feet.  Gastrointestinal: Denies abdominal pain, bloating, constipation, diarrhea or blood in the stool.  GU: Denies urgency, frequency, pain with  urination, burning sensation, blood in urine, odor or discharge. Musculoskeletal: Patient reports joint pain.  Denies decrease in range of motion, difficulty with gait, muscle pain or joint swelling.  Skin: Denies redness, rashes, lesions or ulcercations.  Neurological: Denies dizziness, difficulty with memory, difficulty with speech or problems with balance and coordination.  Psych: Patient has a history of anxiety and depression.  Denies SI/HI.  No other specific complaints in a complete  review of systems (except as listed in HPI above).  Objective:   Physical Exam    There were no vitals taken for this visit. Wt Readings from Last 3 Encounters:  04/12/20 265 lb (120.2 kg)  02/09/20 275 lb (124.7 kg)  01/27/20 274 lb (124.3 kg)    General: Appears their stated age, well developed, well nourished in NAD. Skin: Warm, dry and intact. No rashes, lesions or ulcerations noted. HEENT: Head: normal shape and size; Eyes: sclera white, no icterus, conjunctiva pink, PERRLA and EOMs intact; Ears: Tm's gray and intact, normal light reflex; Nose: mucosa pink and moist, septum midline; Throat/Mouth: Teeth present, mucosa pink and moist, no exudate, lesions or ulcerations noted.  Neck:  Neck supple, trachea midline. No masses, lumps or thyromegaly present.  Cardiovascular: Normal rate and rhythm. S1,S2 noted.  No murmur, rubs or gallops noted. No JVD or BLE edema. No carotid bruits noted. Pulmonary/Chest: Normal effort and positive vesicular breath sounds. No respiratory distress. No wheezes, rales or ronchi noted.  Abdomen: Soft and nontender. Normal bowel sounds. No distention or masses noted. Liver, spleen and kidneys non palpable. Musculoskeletal:  Normal range of motion. No signs of joint swelling. No difficulty with gait.  Neurological: Alert and oriented. Cranial nerves II-XII grossly intact. Coordination normal.  Psychiatric: Mood and affect normal. Behavior is normal. Judgment and thought  content normal.    BMET    Component Value Date/Time   NA 140 01/01/2020 1549   NA 141 02/02/2019 0834   K 4.3 01/01/2020 1549   CL 104 01/01/2020 1549   CO2 29 01/01/2020 1549   GLUCOSE 105 (H) 01/01/2020 1549   BUN 19 01/01/2020 1549   BUN 18 02/02/2019 0834   CREATININE 1.21 01/01/2020 1549   CALCIUM 9.7 01/01/2020 1549   GFRNONAA 62 01/01/2020 1549   GFRAA 72 01/01/2020 1549    Lipid Panel     Component Value Date/Time   CHOL 149 02/02/2019 0834   TRIG 93 02/02/2019 0834   HDL 67 02/02/2019 0834   CHOLHDL 2.2 02/02/2019 0834   CHOLHDL 3.1 01/08/2015 0225   VLDL 23 01/08/2015 0225   LDLCALC 65 02/02/2019 0834    CBC    Component Value Date/Time   WBC 7.4 01/01/2020 1549   RBC 4.77 01/01/2020 1549   HGB 15.2 01/01/2020 1549   HCT 43.6 01/01/2020 1549   PLT 292 01/01/2020 1549   MCV 91.4 01/01/2020 1549   MCH 31.9 01/01/2020 1549   MCHC 34.9 01/01/2020 1549   RDW 12.2 01/01/2020 1549   LYMPHSABS 1,554 01/01/2020 1549   MONOABS 0.6 08/27/2018 1501   EOSABS 333 01/01/2020 1549   BASOSABS 37 01/01/2020 1549    Hgb A1C Lab Results  Component Value Date   HGBA1C 5.7 (H) 12/04/2016          Assessment & Plan:    Webb Silversmith, NP This visit occurred during the SARS-CoV-2 public health emergency.  Safety protocols were in place, including screening questions prior to the visit, additional usage of staff PPE, and extensive cleaning of exam room while observing appropriate contact time as indicated for disinfecting solutions.

## 2020-10-07 ENCOUNTER — Other Ambulatory Visit: Payer: Self-pay

## 2020-10-07 ENCOUNTER — Encounter: Payer: Self-pay | Admitting: Internal Medicine

## 2020-10-07 ENCOUNTER — Ambulatory Visit (INDEPENDENT_AMBULATORY_CARE_PROVIDER_SITE_OTHER): Payer: Medicare Other | Admitting: Internal Medicine

## 2020-10-07 VITALS — BP 140/79 | HR 74 | Temp 97.8°F | Resp 18 | Ht 72.0 in | Wt 275.2 lb

## 2020-10-07 DIAGNOSIS — Z23 Encounter for immunization: Secondary | ICD-10-CM

## 2020-10-07 DIAGNOSIS — Z6837 Body mass index (BMI) 37.0-37.9, adult: Secondary | ICD-10-CM | POA: Diagnosis not present

## 2020-10-07 DIAGNOSIS — B9789 Other viral agents as the cause of diseases classified elsewhere: Secondary | ICD-10-CM

## 2020-10-07 DIAGNOSIS — J329 Chronic sinusitis, unspecified: Secondary | ICD-10-CM | POA: Diagnosis not present

## 2020-10-07 DIAGNOSIS — G4733 Obstructive sleep apnea (adult) (pediatric): Secondary | ICD-10-CM

## 2020-10-07 DIAGNOSIS — I2510 Atherosclerotic heart disease of native coronary artery without angina pectoris: Secondary | ICD-10-CM | POA: Diagnosis not present

## 2020-10-07 MED ORDER — PREDNISONE 10 MG PO TABS: ORAL_TABLET | ORAL | 0 refills | Status: DC

## 2020-10-07 NOTE — Progress Notes (Signed)
Subjective:    Patient ID: Robert Greer, male    DOB: [birth year], 65 y.o.   MRN: [id number]  HPI  Patient presents to clinic today for follow-up of OSA.  He reports he is in need of a new machine.  His machine stopped working and he has been using his wife's machine.  His last sleep study was in 2012 by Dr. Burt Ek. He does not nap during the [place] but does not always feel rested when he wakes up.  He also reports intermittent headache, sinus pressure, bilateral ear fullness and postnasal drip.  He reports this started 2 months ago.  He describes the headache as pressure.  He denies ear drainage or loss of hearing.  He denies difficulty swallowing.  He denies runny nose, sore throat, cough or shortness of breath.  He denies fever, chills or body aches.  He takes Zyrtec intermittently with some relief of symptoms.  He has not had sick contacts or exposure to COVID that he is aware of.  He would also like to get his blood counts and iron levels checked.  He has a history of hemochromatosis.    Review of Systems     Past Medical History:  Diagnosis Date   Anxiety    Arthritis    Backache, unspecified    CAD (coronary artery disease)    a. 12/2014 NSTEMI/PCI: LM nl, LAD 85p/m (2.5x22 Resolute Integrity DES), D1 50ost, D2/3 small/nl, LCX 60d, OM1 nl, OM2 min irregs, OM3 nl, RCA 11m(likely culprilt w/ L->R, R->R collats-->Med Rx). Nl EF.   Depressive disorder, not elsewhere classified    Gout    History of echocardiogram    a. 12/2014 Echo: EF 60-65%, no rwma, mild MR. Nl RV fxn.   Hyperlipidemia    Hypertension    Lipoma of back 02/09/2015   Measles    Mobitz (type) II atrioventricular block    Mumps    Other hemochromatosis 12/05/2016   Sleep apnea     Current Outpatient Medications  Medication Sig Dispense Refill   allopurinol (ZYLOPRIM) 100 MG tablet Take 1 tablet (100 mg total) by mouth daily. 90 tablet 0   aspirin 81 MG chewable tablet Chew 1 tablet (81 mg total) by mouth  daily. 30 tablet 3   atorvastatin (LIPITOR) 20 MG tablet Take 1 tablet (20 mg total) by mouth daily. 90 tablet 2   cetirizine (ZYRTEC) 10 MG tablet Take 1 tablet (10 mg total) by mouth as needed for allergies. Reported on 02/09/2015 90 tablet 1   clopidogrel (PLAVIX) 75 MG tablet Take 1 tablet (75 mg total) by mouth daily. PLEASE CONTACT OFFICE TO SCHEDULE ANNUAL APPOINTMENT 90 tablet 0   diclofenac (VOLTAREN) 75 MG EC tablet Take 1 tablet (75 mg total) by mouth 2 (two) times daily. 180 tablet 2   doxycycline (VIBRA-TABS) 100 MG tablet Take 1 tablet (100 mg total) by mouth 2 (two) times daily. For 10 days. Take with full glass of water, stay upright 30 min after taking. (Patient not taking: Reported on 04/12/2020) 20 tablet 0   DULoxetine (CYMBALTA) 60 MG capsule Take 1 capsule (60 mg total) by mouth 2 (two) times daily. 180 capsule 0   ezetimibe (ZETIA) 10 MG tablet Take 1 tablet (10 mg total) by mouth daily. 90 tablet 3   oxyCODONE-acetaminophen (PERCOCET) 10-325 MG tablet Take 1 tablet by mouth daily as needed for pain.      pregabalin (LYRICA) 75 MG capsule Take 75 mg by mouth  2 (two) times daily.     triamcinolone (NASACORT) 55 MCG/ACT AERO nasal inhaler Place 2 sprays into the nose daily. 1 Inhaler 12   valsartan (DIOVAN) 160 MG tablet Take 0.5 tablets (80 mg total) by mouth daily. 45 tablet 3   No current facility-administered medications for this visit.    Allergies  Allergen Reactions   Buspirone Other (See Comments)    Disliked the way it made him feel Other reaction(s): Unknown Disliked the way it made him feel Disliked the way it made him feel   Levaquin [Levofloxacin] Rash    Family History  Problem Relation Age of Onset   Stroke Mother    Liver disease Father    Lung cancer Sister    Diabetes Sister    Liver disease Brother    Lung cancer Paternal Grandmother     Social History   Socioeconomic History   Marital status: Married    Spouse name: Not on file   Number  of children: Not on file   Years of education: Not on file   Highest education level: Not on file  Occupational History   Not on file  Tobacco Use   Smoking status: Never   Smokeless tobacco: Current    Types: Snuff  Vaping Use   Vaping Use: Never used  Substance and Sexual Activity   Alcohol use: Yes    Alcohol/week: 2.0 - 3.0 standard drinks    Types: 2 - 3 Cans of beer per week    Comment: ocassionally   Drug use: No   Sexual activity: Yes  Other Topics Concern   Not on file  Social History Narrative   Not on file   Social Determinants of Health   Financial Resource Strain: Low Risk    Difficulty of Paying Living Expenses: Not hard at all  Food Insecurity: No Food Insecurity   Worried About Charity fundraiser in the Last Year: Never true   [place] in the Last Year: Never true  Transportation Needs: No Transportation Needs   Lack of Transportation (Medical): No   Lack of Transportation (Non-Medical): No  Physical Activity: Inactive   Days of Exercise per Week: 0 days   Minutes of Exercise per Session: 0 min  Stress: No Stress Concern Present   Feeling of Stress : Not at all  Social Connections: Not on file  Intimate Partner Violence: Not on file     Constitutional: Patient reports intermittent headache.  Denies fever, malaise, fatigue, or abrupt weight changes.  HEENT: Patient reports sinus pressure, ear fullness and postnasal drip.  Denies eye pain, eye redness, ear pain, ringing in the ears, wax buildup, runny nose, nasal congestion, bloody nose, or sore throat. Respiratory: Denies difficulty breathing, shortness of breath, cough or sputum production.   Cardiovascular: Denies chest pain, chest tightness, palpitations or swelling in the hands or feet.  Neurological: Denies dizziness, difficulty with memory, difficulty with speech or problems with balance and coordination.    No other specific complaints in a complete review of systems (except as listed in  HPI above).  Objective:   Physical Exam  BP 140/79 (BP Location: Left Arm, Patient Position: Sitting, Cuff Size: Large)   Pulse 74   Temp 97.8 F (36.6 C) (Temporal)   Resp 18   Ht 6' (1.829 m)   Wt 275 lb 3.2 oz (124.8 kg)   SpO2 98%   BMI 37.32 kg/m  Is Wt Readings from Last 3 Encounters:  04/12/20 265 lb (120.2 kg)  02/09/20 275 lb (124.7 kg)  01/27/20 274 lb (124.3 kg)    General: Appears his stated age, obese, in NAD. HEENT: Head: normal shape and size no frontal or maxillary sinus pressure noted; Eyes: sclera white and EOMs intact; Ears: Tm's gray and intact, normal light reflex, + serous effusion bilaterally; Throat/Mouth: Teeth present, mucosa pink and moist, + PND.  No exudate, lesions or ulcerations noted.  Neck: No adenopathy noted. Cardiovascular: Normal rate and rhythm. S1,S2 noted.  No murmur, rubs or gallops noted.  Pulmonary/Chest: Normal effort and positive vesicular breath sounds. No respiratory distress. No wheezes, rales or ronchi noted.  Neurological: Alert and oriented.   BMET    Component Value Date/Time   NA 140 01/01/2020 1549   NA 141 02/02/2019 0834   K 4.3 01/01/2020 1549   CL 104 01/01/2020 1549   CO2 29 01/01/2020 1549   GLUCOSE 105 (H) 01/01/2020 1549   BUN 19 01/01/2020 1549   BUN 18 02/02/2019 0834   CREATININE 1.21 01/01/2020 1549   CALCIUM 9.7 01/01/2020 1549   GFRNONAA 62 01/01/2020 1549   GFRAA 72 01/01/2020 1549    Lipid Panel     Component Value Date/Time   CHOL 149 02/02/2019 0834   TRIG 93 02/02/2019 0834   HDL 67 02/02/2019 0834   CHOLHDL 2.2 02/02/2019 0834   CHOLHDL 3.1 01/08/2015 0225   VLDL 23 01/08/2015 0225   LDLCALC 65 02/02/2019 0834    CBC    Component Value Date/Time   WBC 7.4 01/01/2020 1549   RBC 4.77 01/01/2020 1549   HGB 15.2 01/01/2020 1549   HCT 43.6 01/01/2020 1549   PLT 292 01/01/2020 1549   MCV 91.4 01/01/2020 1549   MCH 31.9 01/01/2020 1549   MCHC 34.9 01/01/2020 1549   RDW 12.2  01/01/2020 1549   LYMPHSABS 1,554 01/01/2020 1549   MONOABS 0.6 08/27/2018 1501   EOSABS 333 01/01/2020 1549   BASOSABS 37 01/01/2020 1549    Hgb A1C Lab Results  Component Value Date   HGBA1C 5.7 (H) 12/04/2016          Assessment & Plan:   Viral Sinusitis:   Can use a Neti Pot which can be purchased from your local drug store. RX for Pred Taper x 6 days No indication for abx at this time  Return precautions discussed   Webb Silversmith, NP This visit occurred during the SARS-CoV-2 public health emergency.  Safety protocols were in place, including screening questions prior to the visit, additional usage of staff PPE, and extensive cleaning of exam room while observing appropriate contact time as indicated for disinfecting solutions.

## 2020-10-07 NOTE — Patient Instructions (Signed)
Sleep Apnea Sleep apnea affects breathing during sleep. It causes breathing to stop for 10 seconds or more, or to become shallow. People with sleep apnea usually snoreloudly. It can also increase the risk of: Heart attack. Stroke. Being very overweight (obese). Diabetes. Heart failure. Irregular heartbeat. High blood pressure. The goal of treatment is to help you breathe normally again. What are the causes?  The most common cause of this condition is a collapsed or blocked airway. There are three kinds of sleep apnea: Obstructive sleep apnea. This is caused by a blocked or collapsed airway. Central sleep apnea. This happens when the brain does not send the right signals to the muscles that control breathing. Mixed sleep apnea. This is a combination of obstructive and central sleep apnea. What increases the risk? Being overweight. Smoking. Having a small airway. Being older. Being male. Drinking alcohol. Taking medicines to calm yourself (sedatives or tranquilizers). Having family members with the condition. Having a tongue or tonsils that are larger than normal. What are the signs or symptoms? Trouble staying asleep. Loud snoring. Headaches in the morning. Waking up gasping. Dry mouth or sore throat in the morning. Being sleepy or tired during the [place]. If you are sleepy or tired during the [place], you may also: Not be able to focus your mind (concentrate). Forget things. Get angry a lot and have mood swings. Feel sad (depressed). Have changes in your personality. Have less interest in sex, if you are male. Be unable to have an erection, if you are male. How is this treated?  Sleeping on your side. Using a medicine to get rid of mucus in your nose (decongestant). Avoiding the use of alcohol, medicines to help you relax, or certain pain medicines (narcotics). Losing weight, if needed. Changing your diet. Quitting smoking. Using a machine to open your airway while you  sleep, such as: An oral appliance. This is a mouthpiece that shifts your lower jaw forward. A CPAP device. This device blows air through a mask when you breathe out (exhale). An EPAP device. This has valves that you put in each nostril. A BPAP device. This device blows air through a mask when you breathe in (inhale) and breathe out. Having surgery if other treatments do not work. Follow these instructions at home: Lifestyle Make changes that your doctor recommends. Eat a healthy diet. Lose weight if needed. Avoid alcohol, medicines to help you relax, and some pain medicines. Do not smoke or use any products that contain nicotine or tobacco. If you need help quitting, ask your doctor. General instructions Take over-the-counter and prescription medicines only as told by your doctor. If you were given a machine to use while you sleep, use it only as told by your doctor. If you are having surgery, make sure to tell your doctor you have sleep apnea. You may need to bring your device with you. Keep all follow-up visits. Contact a doctor if: The machine that you were given to use during sleep bothers you or does not seem to be working. You do not get better. You get worse. Get help right away if: Your chest hurts. You have trouble breathing in enough air. You have an uncomfortable feeling in your back, arms, or stomach. You have trouble talking. One side of your body feels weak. A part of your face is hanging down. These symptoms may be an emergency. Get help right away. Call your local emergency services (911 in the [place] Do not wait to see if the symptoms   will go away. Do not drive yourself to the hospital. Summary This condition affects breathing during sleep. The most common cause is a collapsed or blocked airway. The goal of treatment is to help you breathe normally while you sleep. This information is not intended to replace advice given to you by your health care provider. Make  sure you discuss any questions you have with your healthcare provider. Document Revised: 12/18/2019 Document Reviewed: 12/18/2019 Elsevier Patient Education  2022 Elsevier Inc.  

## 2020-10-08 LAB — CBC
HCT: 43.8 % (ref 38.5–50.0)
Hemoglobin: 15.2 g/dL (ref 13.2–17.1)
MCH: 32.4 pg (ref 27.0–33.0)
MCHC: 34.7 g/dL (ref 32.0–36.0)
MCV: 93.4 fL (ref 80.0–100.0)
MPV: 9.4 fL (ref 7.5–12.5)
Platelets: 322 10*3/uL (ref 140–400)
RBC: 4.69 10*6/uL (ref 4.20–5.80)
RDW: 12.2 % (ref 11.0–15.0)
WBC: 8.4 10*3/uL (ref 3.8–10.8)

## 2020-10-08 LAB — IRON,TIBC AND FERRITIN PANEL
%SAT: 44 % (calc) (ref 20–48)
Ferritin: 310 ng/mL (ref 24–380)
Iron: 128 ug/dL (ref 50–180)
TIBC: 292 mcg/dL (calc) (ref 250–425)

## 2020-10-09 DIAGNOSIS — E6609 Other obesity due to excess calories: Secondary | ICD-10-CM | POA: Insufficient documentation

## 2020-10-09 DIAGNOSIS — G4733 Obstructive sleep apnea (adult) (pediatric): Secondary | ICD-10-CM | POA: Insufficient documentation

## 2020-10-09 DIAGNOSIS — Z6837 Body mass index (BMI) 37.0-37.9, adult: Secondary | ICD-10-CM | POA: Insufficient documentation

## 2020-10-09 NOTE — Assessment & Plan Note (Signed)
ESS score of 3 Referral to pulmonology for repeat sleep study, new CPAP machine Encouraged weight loss as this can help reduce reflux symptoms

## 2020-10-09 NOTE — Assessment & Plan Note (Signed)
CBC and IBC panel today

## 2020-10-09 NOTE — Assessment & Plan Note (Signed)
Encouraged diet and exercise for weight loss ?

## 2020-10-18 ENCOUNTER — Ambulatory Visit: Payer: Medicare Other | Admitting: Internal Medicine

## 2020-11-02 DIAGNOSIS — Z20822 Contact with and (suspected) exposure to covid-19: Secondary | ICD-10-CM | POA: Diagnosis not present

## 2020-11-03 DIAGNOSIS — Z20822 Contact with and (suspected) exposure to covid-19: Secondary | ICD-10-CM | POA: Diagnosis not present

## 2020-11-18 ENCOUNTER — Institutional Professional Consult (permissible substitution): Payer: Medicare Other | Admitting: Primary Care

## 2020-11-27 NOTE — Progress Notes (Signed)
Date:  11/28/2020   ID:  Robert Greer, DOB 1955/08/22, MRN [id number]  Patient Location:  [place] [id number]   Provider location:   [facility], [place] office  PCP:  Jearld Fenton, NP  Cardiologist:  Patsy Baltimore  Chief Complaint  Patient presents with   Other    12 month f/u no complaints today. Meds reviewed verbally with pt.    History of Present Illness:    Robert Greer is a 65 y.o. 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  history of right shoulder repair, back surgery, partial knee replacement  chronic back pain, stenosis  Denies chest pain concerning for angina No regular exercise program Drinks lots of sweet tea in the truck, weight up 10 pounds from earlier in 2022 Blood pressure elevated on today's visit, does not check at home Weight Was previously 249 in 2018 now 269  Tolerating statin and Zetia, aspirin and Plavix  Medications reviewed with him in detail Lab Results  Component Value Date   CHOL 149 02/02/2019   HDL 67 02/02/2019   LDLCALC 65 02/02/2019   TRIG 93 02/02/2019   Home  Prior CV studies:   The following studies were reviewed today:  Echocardiogram report was not completed, ejection fraction unavailable but estimated at normal, 60% with no wall motion abnormality on stress test  Cath 2016 Mid RCA lesion, 100% stenosed. Dist Cx lesion, 60% stenosed. Ost 1st Diag to 1st Diag lesion, 50% stenosed. Prox LAD to Mid LAD lesion, 85% stenosed.  Post intervention, there is a 5% residual stenosis. The left ventricular systolic function is normal.   1. Significant three-vessel coronary artery disease.  Occluded mid right coronary artery is likely the culprit for myocardial infarction.  However, there are readily right to right collaterals and left-to-right collaterals. There is a 60% distal left circumflex stenosis and an 85% proximal to mid heavily calcified LAD disease.   2. Low normal LV systolic function with mildly elevated left ventricular end-diastolic pressure.   3. Successful angioplasty and 3 overlapped drug-eluting stent placement to the proximal to mid LAD. Sluggish flow in the jailed first diagonal which improved with nitroglycerin.   It appears that the RCA occlusion is more than 89 days old and already has some faint collaterals. Thus, no PCI was performed. The distal vessel also appears to be diffusely diseased and does not seem to be a good target for intervention. I decided to intervene in the proximal to mid LAD which was heavily calcified. This was a difficult procedure. The distal left circumflex disease can be left to be treated medically. Continue indefinite dual antiplatelet therapy if possible. Aggressive treatment of risk factors.   Past Medical History:  Diagnosis Date   Anxiety    Arthritis    Backache, unspecified    CAD (coronary artery disease)    a. 12/2014 NSTEMI/PCI: LM nl, LAD 85p/m (2.5x22 Resolute Integrity DES), D1 50ost, D2/3 small/nl, LCX 60d, OM1 nl, OM2 min irregs, OM3 nl, RCA 134m (likely culprilt  w/ L->R, R->R collats-->Med Rx). Nl EF.   Depressive disorder, not elsewhere classified    Gout    History of echocardiogram    a. 12/2014 Echo: EF 60-65%, no rwma, mild MR. Nl RV fxn.   Hyperlipidemia    Hypertension    Lipoma of back 02/09/2015   Measles    Mobitz (type) II atrioventricular block    Mumps    Other hemochromatosis 12/05/2016   Sleep apnea    Past Surgical History:   Procedure Laterality Date   ANTERIOR FUSION LUMBAR SPINE     BACK SURGERY  2012   x2   CARDIAC CATHETERIZATION N/A 01/10/2015   Procedure: Left Heart Cath and Coronary Angiography;  Surgeon: Wellington Hampshire, MD;  Location: [facility];  Service: Cardiovascular;  Laterality: N/A;   CARDIAC CATHETERIZATION N/A 01/10/2015   Procedure: Coronary Stent Intervention;  Surgeon: Wellington Hampshire, MD;  Location: [facility];  Service: Cardiovascular;  Laterality: N/A;   COLONOSCOPY  2005   COLONOSCOPY WITH PROPOFOL N/A 02/14/2017   Procedure: COLONOSCOPY WITH PROPOFOL;  Surgeon: [name], MD;  Location: [facility] ENDOSCOPY;  Service: Gastroenterology;  Laterality: N/A;   ESOPHAGOGASTRODUODENOSCOPY (EGD) WITH PROPOFOL N/A 02/14/2017   Procedure: ESOPHAGOGASTRODUODENOSCOPY (EGD) WITH PROPOFOL;  Surgeon: [name], MD;  [facility];  Service: Gastroenterology;  Laterality: N/A;   KNEE SURGERY     bilateral   TONSILLECTOMY AND ADENOIDECTOMY     TOTAL SHOULDER REPLACEMENT Right      Current Meds  Medication Sig   allopurinol (ZYLOPRIM) 100 MG tablet Take 1 tablet (100 mg total) by mouth daily. (Patient taking differently: Take 100 mg by mouth daily as needed.)   aspirin 81 MG chewable tablet Chew 1 tablet (81 mg total) by mouth daily.   atorvastatin (LIPITOR) 20 MG tablet Take 1 tablet (20 mg total) by mouth daily.   cetirizine (ZYRTEC) 10 MG tablet Take 1 tablet (10 mg total) by mouth as needed for allergies. Reported on 02/09/2015   clopidogrel (PLAVIX) 75 MG tablet Take 1 tablet (75 mg total) by mouth daily. PLEASE CONTACT OFFICE TO SCHEDULE ANNUAL APPOINTMENT   diclofenac (VOLTAREN) 75 MG EC tablet Take 75 mg by mouth 2 (two) times daily.   DULoxetine (CYMBALTA) 60 MG capsule Take 1 capsule (60 mg total) by mouth 2 (two) times daily.   oxyCODONE-acetaminophen (PERCOCET) 10-325 MG tablet Take 1 tablet by mouth daily as needed for pain.    pregabalin (LYRICA) 75 MG  capsule Take 75 mg by mouth 2 (two) times daily.   triamcinolone (NASACORT) 55 MCG/ACT AERO nasal inhaler Place 2 sprays into the nose daily.   valsartan (DIOVAN) 160 MG tablet Take 0.5 tablets (80 mg total) by mouth daily.     Allergies:   Buspirone and Levaquin [levofloxacin]   Social History   Tobacco Use   Smoking status: Never   Smokeless tobacco: Current    Types: Snuff  Vaping Use   Vaping Use: Never used  Substance Use Topics   Alcohol use: Yes    Alcohol/week: 2.0 - 3.0 standard drinks    Types: 2 - 3 Cans of beer per week    Comment: ocassionally   Drug use: No     Family Hx: The patient's family history includes Diabetes in his sister; Liver disease in his brother and father; Lung cancer in his paternal grandmother and sister; Stroke in his mother.  ROS:   Please see the history of present illness.  Review of Systems  Constitutional: Negative.   Respiratory: Negative.    Cardiovascular: Negative.   Gastrointestinal: Negative.   Musculoskeletal:  Positive for back pain.  Neurological: Negative.   Psychiatric/Behavioral: Negative.    All other systems reviewed and are negative.   Labs/Other Tests and Data Reviewed:    Recent Labs: 01/01/2020: ALT 49; BUN 19; Creat 1.21; Potassium 4.3; Sodium 140 10/07/2020: Hemoglobin 15.2; Platelets 322   Recent Lipid Panel Lab Results  Component Value Date/Time   CHOL 149 02/02/2019 08:34 AM   TRIG 93 02/02/2019 08:34 AM   HDL 67 02/02/2019 08:34 AM   CHOLHDL 2.2 02/02/2019 08:34 AM   CHOLHDL 3.1 01/08/2015 02:25 AM   LDLCALC 65 02/02/2019 08:34 AM    Wt Readings from Last 3 Encounters:  11/28/20 275 lb 6 oz (124.9 kg)  10/07/20 275 lb 3.2 oz (124.8 kg)  04/12/20 265 lb (120.2 kg)     Exam:   BP (!) 168/98 (BP Location: Left Arm, Patient Position: Sitting, Cuff Size: Large)   Pulse 74   Ht 6' (1.829 m)   Wt 275 lb 6 oz (124.9 kg)   SpO2 92%   BMI 37.35 kg/m  Constitutional:  oriented to person, place,  and time. No distress.  HENT:  Head: Grossly normal Eyes:  no discharge. No scleral icterus.  Neck: No JVD, no carotid bruits  Cardiovascular: Regular rate and rhythm, no murmurs appreciated Pulmonary/Chest: Clear to auscultation bilaterally, no wheezes or rails Abdominal: Soft.  no distension.  no tenderness.  Musculoskeletal: Normal range of motion Neurological:  normal muscle tone. Coordination normal. No atrophy Skin: Skin warm and dry Psychiatric: normal affect, pleasant  ASSESSMENT & PLAN:    Coronary artery disease of native artery of native heart with stable angina pectoris (HCC) Currently with no symptoms of angina. No further workup at this time. Continue current medication regimen.  Hypertension, unspecified type Blood pressure elevated, recent 10 pound weight gain since March 22 Recommend he change Diovan 80 up to Diovan HCTZ 180/12.5 daily Walking program, recommend he cut down on his sweet tea  Mixed hyperlipidemia On last clinic visit weight was up 20 pounds, now weight is up additional 10 pounds Recommended going to water in the truck or diet beverage Follow low carbohydrate diet Detail that cholesterol numbers are likely going to be higher with 10 pound weight gain  Chronic low back pain, unspecified back pain laterality, unspecified whether sciatica present Followed by neurosurgery  back surgeries, still with chronic pain   Total encounter time more than 25 minutes  Greater than 50% was spent in counseling and coordination of care with the patient    Signed, Ida Rogue, MD  11/28/2020 5:08 PM    [facility] [id number], [place], [place] [address]

## 2020-11-28 ENCOUNTER — Ambulatory Visit (INDEPENDENT_AMBULATORY_CARE_PROVIDER_SITE_OTHER): Payer: Medicare Other | Admitting: Cardiovascular Disease

## 2020-11-28 ENCOUNTER — Encounter: Payer: Self-pay | Admitting: Cardiovascular Disease

## 2020-11-28 ENCOUNTER — Other Ambulatory Visit: Payer: Self-pay

## 2020-11-28 VITALS — BP 168/98 | HR 74 | Ht 72.0 in | Wt 275.4 lb

## 2020-11-28 DIAGNOSIS — E785 Hyperlipidemia, unspecified: Secondary | ICD-10-CM | POA: Diagnosis not present

## 2020-11-28 DIAGNOSIS — I25118 Atherosclerotic heart disease of native coronary artery with other forms of angina pectoris: Secondary | ICD-10-CM | POA: Diagnosis not present

## 2020-11-28 DIAGNOSIS — I10 Essential (primary) hypertension: Secondary | ICD-10-CM | POA: Diagnosis not present

## 2020-11-28 DIAGNOSIS — I2510 Atherosclerotic heart disease of native coronary artery without angina pectoris: Secondary | ICD-10-CM

## 2020-11-28 MED ORDER — EZETIMIBE 10 MG PO TABS: 10.0000 mg | ORAL_TABLET | Freq: Every day | ORAL | 3 refills | Status: DC

## 2020-11-28 MED ORDER — ATORVASTATIN CALCIUM 20 MG PO TABS: 20.0000 mg | ORAL_TABLET | Freq: Every day | ORAL | 3 refills | Status: DC

## 2020-11-28 MED ORDER — VALSARTAN-HYDROCHLOROTHIAZIDE 160-12.5 MG PO TABS: 1.0000 | ORAL_TABLET | Freq: Every day | ORAL | 3 refills | Status: DC

## 2020-11-28 MED ORDER — CLOPIDOGREL BISULFATE 75 MG PO TABS: 75.0000 mg | ORAL_TABLET | Freq: Every day | ORAL | 3 refills | Status: DC

## 2020-11-28 NOTE — Patient Instructions (Addendum)
Medication Instructions:  Please STOP the valsartan  Please START  valsartan HCT 160/12.5   Please call with blood pressure  If you need a refill on your cardiac medications before your next appointment, please call your pharmacy.   Lab work: No new labs needed  Testing/Procedures: No new testing needed  Follow-Up: At [facility], you and your health needs are our priority.  As part of our continuing mission to provide you with exceptional heart care, we have created designated Provider Care Teams.  These Care Teams include your primary Cardiologist (physician) and Advanced Practice Providers (APPs -  Physician Assistants and Nurse Practitioners) who all work together to provide you with the care you need, when you need it.  You will need a follow up appointment in 12 months  Providers on your designated Care Team:   Murray Hodgkins, NP Christell Faith, PA-C Cadence Kathlen Mody, [place]  COVID-19 Vaccine Information can be found at: [email or web address] For questions related to vaccine distribution or appointments, please email [email or web address] or call [phone].   Please monitor blood pressures and keep a log of your readings.  Call the clinic in 2 weeks with BP readings.   How to use a home blood pressure monitor. Be still. Measure at the same time every [place]. It's important to take the readings at the same time each [place], such as morning and evening. Take reading approximately 1 1/2 to 2 hours after BP medications.   AVOID these things for 30 minutes before checking your blood pressure: Drinking caffeine. Drinking alcohol. Eating. Smoking. Exercising.

## 2020-12-02 DIAGNOSIS — M47812 Spondylosis without myelopathy or radiculopathy, cervical region: Secondary | ICD-10-CM | POA: Diagnosis not present

## 2020-12-02 DIAGNOSIS — M542 Cervicalgia: Secondary | ICD-10-CM | POA: Diagnosis not present

## 2020-12-07 ENCOUNTER — Other Ambulatory Visit: Payer: Self-pay

## 2020-12-07 ENCOUNTER — Ambulatory Visit (INDEPENDENT_AMBULATORY_CARE_PROVIDER_SITE_OTHER): Payer: Medicare Other | Admitting: Primary Care

## 2020-12-07 ENCOUNTER — Encounter: Payer: Self-pay | Admitting: Primary Care

## 2020-12-07 DIAGNOSIS — G4733 Obstructive sleep apnea (adult) (pediatric): Secondary | ICD-10-CM

## 2020-12-07 NOTE — Progress Notes (Signed)
@Patient  ID: Robert Greer, male    DOB: 1955-10-05, 65 y.o.   MRN: [id number]  No chief complaint on file.   Referring provider: Jearld Fenton, NP  HPI: 65 year old male, never smoked.  Past medical history significant for OSA, coronary artery disease, STEMI, GERD, obesity.   12/07/2020 Patient presents today for sleep consult. He has history of sleep apnea, up until recently he was maintained on BIPAP.  There is no record of sleep study in Epic, he thinks he had one done about 10 [facility] ago at [facility] [place]. These results are not available but we will request that they be sent to [place]. His BIPAP machine broke 6 months ago. He has been using his wifes CPAP machine without issue. He is not sure of either settings. No download available for review. He repots benefit from PAP therapy. He drives truck part time. He does not feel sleepy during the [place].  Denies symptoms of narcolepsy, cataplexy or sleep walking.   Sleep questionnaire Prior sleep study- >10 years ago at sleep med Symptoms- Loud snoring, restless sleep  Bedtime- 8:30-9pm Time to fall asleep- <15 mins  Nocturnal awakenings- 3-4 times  Out of bed in morning- 4:30am  Weight changes- up 25lbs  Epworth- 4  Allergies  Allergen Reactions   Buspirone Other (See Comments)    Disliked the way it made him feel Other reaction(s): Unknown Disliked the way it made him feel Disliked the way it made him feel   Levaquin [Levofloxacin] Rash    Immunization History  Administered Date(s) Administered   Fluad Quad(high Dose 65+) 01/01/2020, 10/07/2020   Influenza,inj,Quad PF,6+ Mos 01/01/2018   Influenza,inj,quad, With Preservative 01/03/2014   Influenza-Unspecified 12/17/2014, 11/03/2015, 01/01/2018   Moderna Sars-Covid-2 Vaccination 03/06/2019, 04/03/2019   Pneumococcal Conjugate-13 08/21/2018   Tdap 07/02/2017, 02/04/2018   Zoster, Live 05/02/2015    Past Medical History:  Diagnosis Date   Anxiety    Arthritis     Backache, unspecified    CAD (coronary artery disease)    a. 12/2014 NSTEMI/PCI: LM nl, LAD 85p/m (2.5x22 Resolute Integrity DES), D1 50ost, D2/3 small/nl, LCX 60d, OM1 nl, OM2 min irregs, OM3 nl, RCA 138m (likely culprilt w/ L->R, R->R collats-->Med Rx). Nl EF.   Depressive disorder, not elsewhere classified    Gout    History of echocardiogram    a. 12/2014 Echo: EF 60-65%, no rwma, mild MR. Nl RV fxn.   Hyperlipidemia    Hypertension    Lipoma of back 02/09/2015   Measles    Mobitz (type) II atrioventricular block    Mumps    Other hemochromatosis 12/05/2016   Sleep apnea     Tobacco History: Social History   Tobacco Use  Smoking Status Never  Smokeless Tobacco Current   Types: Snuff   Ready to quit: Not Answered Counseling given: Not Answered   Outpatient Medications Prior to Visit  Medication Sig Dispense Refill   allopurinol (ZYLOPRIM) 100 MG tablet Take 1 tablet (100 mg total) by mouth daily. (Patient taking differently: Take 100 mg by mouth daily as needed.) 90 tablet 0   aspirin 81 MG chewable tablet Chew 1 tablet (81 mg total) by mouth daily. 30 tablet 3   atorvastatin (LIPITOR) 20 MG tablet Take 1 tablet (20 mg total) by mouth daily. 90 tablet 3   cetirizine (ZYRTEC) 10 MG tablet Take 1 tablet (10 mg total) by mouth as needed for allergies. Reported on 02/09/2015 90 tablet 1   clopidogrel (  PLAVIX) 75 MG tablet Take 1 tablet (75 mg total) by mouth daily. 90 tablet 3   diclofenac (VOLTAREN) 75 MG EC tablet Take 75 mg by mouth 2 (two) times daily.     DULoxetine (CYMBALTA) 60 MG capsule Take 1 capsule (60 mg total) by mouth 2 (two) times daily. 180 capsule 0   ezetimibe (ZETIA) 10 MG tablet Take 1 tablet (10 mg total) by mouth daily. 90 tablet 3   oxyCODONE-acetaminophen (PERCOCET) 10-325 MG tablet Take 1 tablet by mouth daily as needed for pain.      triamcinolone (NASACORT) 55 MCG/ACT AERO nasal inhaler Place 2 sprays into the nose daily. 1 Inhaler 12    valsartan-hydrochlorothiazide (DIOVAN-HCT) 160-12.5 MG tablet Take 1 tablet by mouth daily. 90 tablet 3   pregabalin (LYRICA) 75 MG capsule Take 75 mg by mouth 2 (two) times daily.     No facility-administered medications prior to visit.    Review of Systems  Review of Systems  Constitutional: Negative.   Respiratory: Negative.    Psychiatric/Behavioral: Negative.      Physical Exam  BP 126/82 (BP Location: Left Arm, Patient Position: Sitting, Cuff Size: Normal)   Pulse 77   Temp (!) 96.8 F (36 C) (Oral)   Ht 6' (1.829 m)   Wt 275 lb (124.7 kg)   SpO2 97%   BMI 37.30 kg/m  Physical Exam Constitutional:      Appearance: Normal appearance.  HENT:     Mouth/Throat:     Mouth: Mucous membranes are moist.     Pharynx: Oropharynx is clear.  Cardiovascular:     Rate and Rhythm: Normal rate and regular rhythm.  Pulmonary:     Effort: Pulmonary effort is normal.     Breath sounds: Normal breath sounds.  Musculoskeletal:        General: Normal range of motion.  Skin:    General: Skin is warm and dry.  Neurological:     General: No focal deficit present.     Mental Status: He is alert and oriented to person, place, and time. Mental status is at baseline.  Psychiatric:        Mood and Affect: Mood normal.        Behavior: Behavior normal.        Thought Content: Thought content normal.        Judgment: Judgment normal.     Lab Results:  CBC    Component Value Date/Time   WBC 8.4 10/07/2020 1628   RBC 4.69 10/07/2020 1628   HGB 15.2 10/07/2020 1628   HCT 43.8 10/07/2020 1628   PLT 322 10/07/2020 1628   MCV 93.4 10/07/2020 1628   MCH 32.4 10/07/2020 1628   MCHC 34.7 10/07/2020 1628   RDW 12.2 10/07/2020 1628   LYMPHSABS 1,554 01/01/2020 1549   MONOABS 0.6 08/27/2018 1501   EOSABS 333 01/01/2020 1549   BASOSABS 37 01/01/2020 1549    BMET    Component Value Date/Time   NA 140 01/01/2020 1549   NA 141 02/02/2019 0834   K 4.3 01/01/2020 1549   CL 104  01/01/2020 1549   CO2 29 01/01/2020 1549   GLUCOSE 105 (H) 01/01/2020 1549   BUN 19 01/01/2020 1549   BUN 18 02/02/2019 0834   CREATININE 1.21 01/01/2020 1549   CALCIUM 9.7 01/01/2020 1549   GFRNONAA 62 01/01/2020 1549   GFRAA 72 01/01/2020 1549    BNP No results found for: BNP  ProBNP No results found for: PROBNP  Imaging: No results found.   Assessment & Plan:   OSA (obstructive sleep apnea) - Dx with sleep apnea > 10 years ago. Sleep study results are not in Epic. Previously maintained on BIPAP, machine broke 6 months ago. He has been using his wife's CPAP. No download available. I have asked patient to bring bring in SD card from BIPAP machine for compliance check. Requesting [facility] from [facility] [place]. Once we have received the following information we can then place order for new BIPAP machine, if unable to obtain will need in-lab split night sleep study    Martyn Ehrich, NP 12/07/2020

## 2020-12-07 NOTE — Patient Instructions (Addendum)
Recommendations: Please bring in both your BIPAP and your wife's SD card for download   Orders: [facility] sleep study from [facility]   Follow-up: 3-4 months with Beth NP    CPAP and BIPAP Information CPAP and BIPAP are methods that use air pressure to keep your airways open and to help you breathe well. CPAP and BIPAP use different amounts of pressure. Your health care provider will tell you whether CPAP or BIPAP would be more helpful for you. CPAP stands for "continuous positive airway pressure." With CPAP, the amount of pressure stays the same while you breathe in (inhale) and out (exhale). BIPAP stands for "bi-level positive airway pressure." With BIPAP, the amount of pressure will be higher when you inhale and lower when you exhale. This allows you to take larger breaths. CPAP or BIPAP may be used in the hospital, or your health care provider may want you to use it at home. You may need to have a sleep study before your health care provider can order a machine for you to use at home. What are the advantages? CPAP or BIPAP can be helpful if you have: Sleep apnea. Chronic obstructive pulmonary disease (COPD). Heart failure. Medical conditions that cause muscle weakness, including muscular dystrophy or amyotrophic lateral sclerosis (ALS). Other problems that cause breathing to be shallow, weak, abnormal, or difficult. CPAP and BIPAP are most commonly used for obstructive sleep apnea (OSA) to keep the airways from collapsing when the muscles relax during sleep. What are the risks? Generally, this is a safe treatment. However, problems may occur, including: Irritated skin or skin sores if the mask does not fit properly. Dry or stuffy nose or nosebleeds. Dry mouth. Feeling gassy or bloated. Sinus or lung infection if the equipment is not cleaned properly. When should CPAP or BIPAP be used? In most cases, the mask only needs to be worn during sleep. Generally, the mask needs to be worn  throughout the night and during any daytime naps. People with certain medical conditions may also need to wear the mask at other times, such as when they are awake. Follow instructions from your health care provider about when to use the machine. What happens during CPAP or BIPAP? Both CPAP and BIPAP are provided by a small machine with a flexible plastic tube that attaches to a plastic mask that you wear. Air is blown through the mask into your nose or mouth. The amount of pressure that is used to blow the air can be adjusted on the machine. Your health care provider will set the pressure setting and help you find the best mask for you. Tips for using the mask Because the mask needs to be snug, some people feel trapped or closed-in (claustrophobic) when first using the mask. If you feel this way, you may need to get used to the mask. One way to do this is to hold the mask loosely over your nose or mouth and then gradually apply the mask more snugly. You can also gradually increase the amount of time that you use the mask. Masks are available in various types and sizes. If your mask does not fit well, talk with your health care provider about getting a different one. Some common types of masks include: Full face masks, which fit over the mouth and nose. Nasal masks, which fit over the nose. Nasal pillow or prong masks, which fit into the nostrils. If you are using a mask that fits over your nose and you tend to  breathe through your mouth, a chin strap may be applied to help keep your mouth closed. Use a skin barrier to protect your skin as told by your health care provider. Some CPAP and BIPAP machines have alarms that may sound if the mask comes off or develops a leak. If you have trouble with the mask, it is very important that you talk with your health care provider about finding a way to make the mask easier to tolerate. Do not stop using the mask. There could be a negative impact on your health if  you stop using the mask. Tips for using the machine Place your CPAP or BIPAP machine on a secure table or stand near an electrical outlet. Know where the on/off switch is on the machine. Follow instructions from your health care provider about how to set the pressure on your machine and when you should use it. Do not eat or drink while the CPAP or BIPAP machine is on. Food or fluids could get pushed into your lungs by the pressure of the CPAP or BIPAP. For home use, CPAP and BIPAP machines can be rented or purchased through home health care companies. Many different brands of machines are available. Renting a machine before purchasing may help you find out which particular machine works well for you. Your health insurance company may also decide which machine you may get. Keep the CPAP or BIPAP machine and attachments clean. Ask your health care provider for specific instructions. Check the humidifier if you have a dry stuffy nose or nosebleeds. Make sure it is working correctly. Follow these instructions at home: Take over-the-counter and prescription medicines only as told by your health care provider. Ask if you can take sinus medicine if your sinuses are blocked. Do not use any products that contain nicotine or tobacco. These products include cigarettes, chewing tobacco, and vaping devices, such as e-cigarettes. If you need help quitting, ask your health care provider. Keep all follow-up visits. This is important. Contact a health care provider if: You have redness or pressure sores on your head, face, mouth, or nose from the mask or head gear. You have trouble using the CPAP or BIPAP machine. You cannot tolerate wearing the CPAP or BIPAP mask. Someone tells you that you snore even when wearing your CPAP or BIPAP. Get help right away if: You have trouble breathing. You feel confused. Summary CPAP and BIPAP are methods that use air pressure to keep your airways open and to help you breathe  well. If you have trouble with the mask, it is very important that you talk with your health care provider about finding a way to make the mask easier to tolerate. Do not stop using the mask. There could be a negative impact to your health if you stop using the mask. Follow instructions from your health care provider about when to use the machine. This information is not intended to replace advice given to you by your health care provider. Make sure you discuss any questions you have with your health care provider. Document Revised: 08/17/2020 Document Reviewed: 12/18/2019 Elsevier Patient Education  2022 [employer].

## 2020-12-07 NOTE — Assessment & Plan Note (Signed)
-   Dx with sleep apnea > 10 years ago. Sleep study results are not in Epic. Previously maintained on BIPAP, machine broke 6 months ago. He has been using his wife's CPAP. No download available. I have asked patient to bring bring in SD card from BIPAP machine for compliance check. Requesting [facility] from [facility] [place]. Once we have received the following information we can then place order for new BIPAP machine, if unable to obtain will need in-lab split night sleep study

## 2020-12-08 NOTE — Progress Notes (Signed)
Reviewed and agree with assessment/plan.   Chesley Mires, MD [facility] Pulmonary/Critical Care 12/08/2020, 9:35 AM Pager:  [phone]

## 2020-12-13 DIAGNOSIS — M542 Cervicalgia: Secondary | ICD-10-CM | POA: Diagnosis not present

## 2020-12-13 DIAGNOSIS — Z20828 Contact with and (suspected) exposure to other viral communicable diseases: Secondary | ICD-10-CM | POA: Diagnosis not present

## 2020-12-13 DIAGNOSIS — M25542 Pain in joints of left hand: Secondary | ICD-10-CM | POA: Diagnosis not present

## 2020-12-13 DIAGNOSIS — M151 Heberden's nodes (with arthropathy): Secondary | ICD-10-CM | POA: Diagnosis not present

## 2020-12-13 DIAGNOSIS — M25541 Pain in joints of right hand: Secondary | ICD-10-CM | POA: Diagnosis not present

## 2020-12-13 DIAGNOSIS — M5416 Radiculopathy, lumbar region: Secondary | ICD-10-CM | POA: Diagnosis not present

## 2020-12-23 ENCOUNTER — Other Ambulatory Visit: Payer: Self-pay

## 2020-12-30 ENCOUNTER — Telehealth: Payer: Self-pay

## 2020-12-30 DIAGNOSIS — G4733 Obstructive sleep apnea (adult) (pediatric): Secondary | ICD-10-CM

## 2020-12-30 DIAGNOSIS — M542 Cervicalgia: Secondary | ICD-10-CM | POA: Diagnosis not present

## 2020-12-30 NOTE — Telephone Encounter (Signed)
Hi Beth in last office note you stated you wanted  to see pt's split night sleep study results. Patient did bring past split night sleep study into office today. It is from 2012. Would you like him to repeat the split night sleep study in order to receive a new BIPAP?

## 2021-01-02 DIAGNOSIS — M542 Cervicalgia: Secondary | ICD-10-CM | POA: Diagnosis not present

## 2021-01-03 NOTE — Telephone Encounter (Signed)
I need sleep study and download from his old machine/wife's machine- if able to get both we can place order for new BIPAP.

## 2021-01-03 NOTE — Telephone Encounter (Signed)
Lm for patient.  

## 2021-01-03 NOTE — Telephone Encounter (Signed)
We will need to order split night sleep study

## 2021-01-03 NOTE — Telephone Encounter (Signed)
Called and spoke to patient, he is okay with doing a split night sleep study. Order placed nothing further needed.

## 2021-01-03 NOTE — Telephone Encounter (Signed)
Unfortunately patient has taken machines over to [employer] since the machines are so old, [employer]  no longer carries the piece to retrieve a download from the machines. What would you like me to do?

## 2021-01-05 ENCOUNTER — Ambulatory Visit: Payer: Self-pay | Admitting: *Deleted

## 2021-01-05 DIAGNOSIS — Z6835 Body mass index (BMI) 35.0-35.9, adult: Secondary | ICD-10-CM | POA: Diagnosis not present

## 2021-01-05 DIAGNOSIS — H1033 Unspecified acute conjunctivitis, bilateral: Secondary | ICD-10-CM | POA: Diagnosis not present

## 2021-01-05 DIAGNOSIS — J329 Chronic sinusitis, unspecified: Secondary | ICD-10-CM | POA: Diagnosis not present

## 2021-01-05 NOTE — Telephone Encounter (Signed)
°  Chief Complaint: sinus symptoms Symptoms: cough, nasal congestion, ear pressure Frequency: 1 week Pertinent Negatives: Patient denies fever Disposition: [] ED /[] Urgent Care (no appt availability in office) / [] Appointment(In office/virtual)/ [x]  [facility]/ [] Home Care/ [] Refused Recommended Disposition  Additional Notes: Patient to schedule through MyChart

## 2021-01-05 NOTE — Telephone Encounter (Signed)
Patient called in to say that for the past few days he have been sick with cough, stuffy head scratchy voice just not feeling well, blood in mucus when blowing nose. Please call Ph# [phone] Reason for Disposition  Earache  Answer Assessment - Initial Assessment Questions 1. LOCATION: "Where does it hurt?"      Head and back of throat 2. ONSET: "When did the sinus pain start?"  (e.g., hours, days)      1 week 3. SEVERITY: "How bad is the pain?"   (Scale 1-10; mild, moderate or severe)   - MILD (1-3): doesn't interfere with normal activities    - MODERATE (4-7): interferes with normal activities (e.g., work or school) or awakens from sleep   - SEVERE (8-10): excruciating pain and patient unable to do any normal activities        Moderate 4. RECURRENT SYMPTOM: "Have you ever had sinus problems before?" If Yes, ask: "When was the last time?" and "What happened that time?"      Seasonal allergies 5. NASAL CONGESTION: "Is the nose blocked?" If Yes, ask: "Can you open it or must you breathe through your mouth?"     Blocks at times 6. NASAL DISCHARGE: "Do you have discharge from your nose?" If so ask, "What color?"     Greenish- blood 7. FEVER: "Do you have a fever?" If Yes, ask: "What is it, how was it measured, and when did it start?"      Not checked 8. OTHER SYMPTOMS: "Do you have any other symptoms?" (e.g., sore throat, cough, earache, difficulty breathing)     Sore throat, cough 9. PREGNANCY: "Is there any chance you are pregnant?" "When was your last menstrual period?"     na  Protocols used: Sinus Pain or Congestion-A-AH

## 2021-02-03 ENCOUNTER — Encounter: Payer: Self-pay | Admitting: Oncology

## 2021-02-13 ENCOUNTER — Ambulatory Visit: Payer: Medicare Other | Attending: Pulmonary Disease

## 2021-02-13 DIAGNOSIS — G4733 Obstructive sleep apnea (adult) (pediatric): Secondary | ICD-10-CM | POA: Insufficient documentation

## 2021-02-13 DIAGNOSIS — Z6837 Body mass index (BMI) 37.0-37.9, adult: Secondary | ICD-10-CM | POA: Diagnosis not present

## 2021-02-17 ENCOUNTER — Other Ambulatory Visit: Payer: Self-pay

## 2021-02-24 ENCOUNTER — Telehealth: Payer: Self-pay

## 2021-02-24 ENCOUNTER — Telehealth (HOSPITAL_BASED_OUTPATIENT_CLINIC_OR_DEPARTMENT_OTHER): Payer: Medicare Other | Admitting: Pulmonary Disease

## 2021-02-24 DIAGNOSIS — G4733 Obstructive sleep apnea (adult) (pediatric): Secondary | ICD-10-CM

## 2021-02-24 NOTE — Telephone Encounter (Signed)
Copied from [place] [id number]. Topic: General - Other >> Feb 24, 2021  9:29 AM Yvette Rack wrote: Reason for CRM: Ann with [facility] stated the changes to the Rx for c-pap supplies was not initialed and dated by the provider. Ann requests that the changes be initialed and dated. Cb# [phone]

## 2021-02-24 NOTE — Telephone Encounter (Signed)
Advised Angel from [facility] that the orders will have to go to his pulmonologist.    Thanks,   Mickel Baas

## 2021-02-24 NOTE — Telephone Encounter (Signed)
I see forms under the media tab dated 02/20/21 but this was done by pulmonology, not me. He needs to contact them.

## 2021-02-24 NOTE — Telephone Encounter (Signed)
Split study : severe OSA, RDI 76/h  Corrected by BIPAP 18/13

## 2021-02-24 NOTE — Telephone Encounter (Signed)
Patient has apt on 2/15, we will review at visit

## 2021-03-06 ENCOUNTER — Ambulatory Visit: Payer: Medicare Other | Admitting: Primary Care

## 2021-03-08 ENCOUNTER — Other Ambulatory Visit: Payer: Self-pay

## 2021-03-08 ENCOUNTER — Encounter: Payer: Self-pay | Admitting: Primary Care

## 2021-03-08 ENCOUNTER — Ambulatory Visit (INDEPENDENT_AMBULATORY_CARE_PROVIDER_SITE_OTHER): Payer: Medicare Other | Admitting: Primary Care

## 2021-03-08 VITALS — BP 138/80 | HR 81 | Temp 97.6°F | Ht 72.0 in | Wt 274.2 lb

## 2021-03-08 DIAGNOSIS — G4733 Obstructive sleep apnea (adult) (pediatric): Secondary | ICD-10-CM | POA: Diagnosis not present

## 2021-03-08 NOTE — Progress Notes (Signed)
@Patient  ID: Robert Greer, male    DOB: December 31, 1955, 66 y.o.   MRN: [id number]  Chief Complaint  Patient presents with   Follow-up    Review sleep study    Referring provider: Jearld Fenton, NP  HPI: 66 year old male, never smoked.  Past medical history significant for OSA, coronary artery disease, STEMI, GERD, obesity.   Previous [facility] pulmonary encounter: 12/07/2020 Patient presents today for sleep consult. He has history of sleep apnea, up until recently he was maintained on BIPAP.  There is no record of sleep study in Epic, he thinks he had one done about 10 years ago at [facility] [place]. These results are not available but we will request that they be sent to [place]. His BIPAP machine broke 6 months ago. He has been using his wifes CPAP machine without issue. He is not sure of either settings. No download available for review. He repots benefit from PAP therapy. He drives truck part time. He does not feel sleepy during the [place].  Denies symptoms of narcolepsy, cataplexy or sleep walking.   Sleep questionnaire Prior sleep study- >10 years ago at sleep med Symptoms- Loud snoring, restless sleep  Bedtime- 8:30-9pm Time to fall asleep- <15 mins  Nocturnal awakenings- 3-4 times  Out of bed in morning- 4:30am  Weight changes- up 25lbs  Epworth- 4  OSA (obstructive sleep apnea) - Dx with sleep apnea > 10 years ago. Sleep study results are not in Epic. Previously maintained on BIPAP, machine broke 6 months ago. He has been using his wife's CPAP. No download available. I have asked patient to bring bring in SD card from BIPAP machine for compliance check. Requesting sleep study results from [facility] in [place]. Once we have received the following information we can then place order for new BIPAP machine, if unable to obtain will need in-lab split night sleep study   03/08/2021 - Interim hx  Patient presents today for 3 month follow-up. He is doing well, no acute complaints. He  has been using his wife's CPAP at night. He had split night sleep study on 02/13/21 that showed severe OSA; total AHI 32.6/hr with SpO2 low 86%, corrected by BIPAP 18/13. We reviewed sleep study results, risk of untreated sleep apnea and treatment options. He would like to continue with BIPAP therapy, needs new machine.    Allergies  Allergen Reactions   Buspirone Other (See Comments)    Disliked the way it made him feel Other reaction(s): Unknown Disliked the way it made him feel Disliked the way it made him feel   Levaquin [Levofloxacin] Rash    Immunization History  Administered Date(s) Administered   Fluad Quad(high Dose 65+) 01/01/2020, 10/07/2020   Influenza,inj,Quad PF,6+ Mos 01/01/2018   Influenza,inj,quad, With Preservative 01/03/2014   Influenza-Unspecified 12/17/2014, 11/03/2015, 01/01/2018   Moderna Sars-Covid-2 Vaccination 03/06/2019, 04/03/2019   Pneumococcal Conjugate-13 08/21/2018   Tdap 07/02/2017, 02/04/2018   Zoster, Live 05/02/2015    Past Medical History:  Diagnosis Date   Anxiety    Arthritis    Backache, unspecified    CAD (coronary artery disease)    a. 12/2014 NSTEMI/PCI: LM nl, LAD 85p/m (2.5x22 Resolute Integrity DES), D1 50ost, D2/3 small/nl, LCX 60d, OM1 nl, OM2 min irregs, OM3 nl, RCA 137m (likely culprilt w/ L->R, R->R collats-->Med Rx). Nl EF.   Depressive disorder, not elsewhere classified    Gout    History of echocardiogram    a. 12/2014 Echo: EF 60-65%, no rwma,  mild MR. Nl RV fxn.   Hyperlipidemia    Hypertension    Lipoma of back 02/09/2015   Measles    Mobitz (type) II atrioventricular block    Mumps    Other hemochromatosis 12/05/2016   Sleep apnea     Tobacco History: Social History   Tobacco Use  Smoking Status Never  Smokeless Tobacco Current   Types: Snuff   Ready to quit: Not Answered Counseling given: Not Answered   Outpatient Medications Prior to Visit  Medication Sig Dispense Refill   aspirin 81 MG chewable  tablet Chew 1 tablet (81 mg total) by mouth daily. 30 tablet 3   atorvastatin (LIPITOR) 20 MG tablet Take 1 tablet (20 mg total) by mouth daily. 90 tablet 3   cetirizine (ZYRTEC) 10 MG tablet Take 1 tablet (10 mg total) by mouth as needed for allergies. Reported on 02/09/2015 90 tablet 1   clopidogrel (PLAVIX) 75 MG tablet Take 1 tablet (75 mg total) by mouth daily. 90 tablet 3   diclofenac (VOLTAREN) 75 MG EC tablet Take 75 mg by mouth 2 (two) times daily.     DULoxetine (CYMBALTA) 60 MG capsule Take 1 capsule (60 mg total) by mouth 2 (two) times daily. 180 capsule 0   ezetimibe (ZETIA) 10 MG tablet Take 1 tablet (10 mg total) by mouth daily. 90 tablet 3   oxyCODONE-acetaminophen (PERCOCET) 10-325 MG tablet Take 1 tablet by mouth daily as needed for pain.      triamcinolone (NASACORT) 55 MCG/ACT AERO nasal inhaler Place 2 sprays into the nose daily. 1 Inhaler 12   valsartan-hydrochlorothiazide (DIOVAN-HCT) 160-12.5 MG tablet Take 1 tablet by mouth daily. 90 tablet 3   allopurinol (ZYLOPRIM) 100 MG tablet Take 1 tablet (100 mg total) by mouth daily. (Patient taking differently: Take 100 mg by mouth daily as needed.) 90 tablet 0   No facility-administered medications prior to visit.    Review of Systems  Review of Systems  Constitutional: Negative.   HENT: Negative.    Respiratory: Negative.    Cardiovascular: Negative.   Psychiatric/Behavioral: Negative.      Physical Exam  BP 138/80 (BP Location: Left Arm, Cuff Size: Large)    Pulse 81    Temp 97.6 F (36.4 C) (Temporal)    Ht 6' (1.829 m)    Wt 274 lb 3.2 oz (124.4 kg)    SpO2 96%    BMI 37.19 kg/m  Physical Exam Constitutional:      Appearance: Normal appearance.  HENT:     Head: Normocephalic and atraumatic.     Mouth/Throat:     Mouth: Mucous membranes are moist.     Pharynx: Oropharynx is clear.  Cardiovascular:     Rate and Rhythm: Normal rate and regular rhythm.  Pulmonary:     Effort: Pulmonary effort is normal.      Breath sounds: Normal breath sounds.  Musculoskeletal:        General: Normal range of motion.  Skin:    General: Skin is warm and dry.  Neurological:     General: No focal deficit present.     Mental Status: He is alert and oriented to person, place, and time. Mental status is at baseline.  Psychiatric:        Mood and Affect: Mood normal.        Behavior: Behavior normal.        Thought Content: Thought content normal.        Judgment: Judgment normal.  Lab Results:  CBC    Component Value Date/Time   WBC 8.4 10/07/2020 1628   RBC 4.69 10/07/2020 1628   HGB 15.2 10/07/2020 1628   HCT 43.8 10/07/2020 1628   PLT 322 10/07/2020 1628   MCV 93.4 10/07/2020 1628   MCH 32.4 10/07/2020 1628   MCHC 34.7 10/07/2020 1628   RDW 12.2 10/07/2020 1628   LYMPHSABS 1,554 01/01/2020 1549   MONOABS 0.6 08/27/2018 1501   EOSABS 333 01/01/2020 1549   BASOSABS 37 01/01/2020 1549    BMET    Component Value Date/Time   NA 140 01/01/2020 1549   NA 141 02/02/2019 0834   K 4.3 01/01/2020 1549   CL 104 01/01/2020 1549   CO2 29 01/01/2020 1549   GLUCOSE 105 (H) 01/01/2020 1549   BUN 19 01/01/2020 1549   BUN 18 02/02/2019 0834   CREATININE 1.21 01/01/2020 1549   CALCIUM 9.7 01/01/2020 1549   GFRNONAA 62 01/01/2020 1549   GFRAA 72 01/01/2020 1549    BNP No results found for: BNP  ProBNP No results found for: PROBNP  Imaging: SLEEP STUDY DOCUMENTS  Result Date: 02/20/2021 Ordered by an unspecified provider.  SLEEP STUDY DOCUMENTS  Result Date: 02/13/2021 Ordered by an unspecified provider.    Assessment & Plan:   OSA (obstructive sleep apnea) - Hx OSA. BIPAP machine broke in May 2022, he has been using wife's CPAP since. Split night sleep study on 02/13/21 confirmed diagnosis severe OSA, total AHI 32.6/hr corrected by BIPAP pressure 18/13. We reviewed sleep study results, risk of untreated sleep apnea and treatment options. Patient would like to continue with BIPAP therapy,  we have placed an order for new BIPAP machine with pressure setting max IPAP 18; Min EPAP 13. Encourage weight loss efforts. Advised against driving if experiencing excessive daytime sleepiness. FU 31-90 days after receiving new BIPAP machine for compliance check.    Martyn Ehrich, NP 03/08/2021

## 2021-03-08 NOTE — Progress Notes (Signed)
Reviewed and agree with assessment/plan.   Chesley Mires, MD [facility] Pulmonary/Critical Care 03/08/2021, 5:05 PM Pager:  [phone]

## 2021-03-08 NOTE — Assessment & Plan Note (Signed)
-   Hx OSA. BIPAP machine broke in May 2022, he has been using wife's CPAP since. Split night sleep study on 02/13/21 confirmed diagnosis severe OSA, total AHI 32.6/hr corrected by BIPAP pressure 18/13. We reviewed sleep study results, risk of untreated sleep apnea and treatment options. Patient would like to continue with BIPAP therapy, we have placed an order for new BIPAP machine with pressure setting max IPAP 18; Min EPAP 13. Encourage weight loss efforts. Advised against driving if experiencing excessive daytime sleepiness. FU 31-90 days after receiving new BIPAP machine for compliance check.

## 2021-03-08 NOTE — Patient Instructions (Signed)
CXR in 2016 was normal, if you develop shortness of breath or new cough let us know and we can repeat imaging   Recommendations: Continue to wear CPAP every night until you receive BIPAP machine with new settings  Do not drive if experiencing excessive daytime sleepiness Work on weight loss efforts, goal <250lbs  Orders: BIPAP 18/13  Follow-up: Please call us after you receive new BIPAP machine for 1 month follow-up     CPAP and BIPAP Information CPAP and BIPAP are methods that use air pressure to keep your airways open and to help you breathe well. CPAP and BIPAP use different amounts of pressure. Your health care provider will tell you whether CPAP or BIPAP would be more helpful for you. CPAP stands for "continuous positive airway pressure." With CPAP, the amount of pressure stays the same while you breathe in (inhale) and out (exhale). BIPAP stands for "bi-level positive airway pressure." With BIPAP, the amount of pressure will be higher when you inhale and lower when you exhale. This allows you to take larger breaths. CPAP or BIPAP may be used in the hospital, or your health care provider may want you to use it at home. You may need to have a sleep study before your health care provider can order a machine for you to use at home. What are the advantages? CPAP or BIPAP can be helpful if you have: Sleep apnea. Chronic obstructive pulmonary disease (COPD). Heart failure. Medical conditions that cause muscle weakness, including muscular dystrophy or amyotrophic lateral sclerosis (ALS). Other problems that cause breathing to be shallow, weak, abnormal, or difficult. CPAP and BIPAP are most commonly used for obstructive sleep apnea (OSA) to keep the airways from collapsing when the muscles relax during sleep. What are the risks? Generally, this is a safe treatment. However, problems may occur, including: Irritated skin or skin sores if the mask does not fit properly. Dry or stuffy nose  or nosebleeds. Dry mouth. Feeling gassy or bloated. Sinus or lung infection if the equipment is not cleaned properly. When should CPAP or BIPAP be used? In most cases, the mask only needs to be worn during sleep. Generally, the mask needs to be worn throughout the night and during any daytime naps. People with certain medical conditions may also need to wear the mask at other times, such as when they are awake. Follow instructions from your health care provider about when to use the machine. What happens during CPAP or BIPAP? Both CPAP and BIPAP are provided by a small machine with a flexible plastic tube that attaches to a plastic mask that you wear. Air is blown through the mask into your nose or mouth. The amount of pressure that is used to blow the air can be adjusted on the machine. Your health care provider will set the pressure setting and help you find the best mask for you. Tips for using the mask Because the mask needs to be snug, some people feel trapped or closed-in (claustrophobic) when first using the mask. If you feel this way, you may need to get used to the mask. One way to do this is to hold the mask loosely over your nose or mouth and then gradually apply the mask more snugly. You can also gradually increase the amount of time that you use the mask. Masks are available in various types and sizes. If your mask does not fit well, talk with your health care provider about getting a different one. Some common types of  masks include: Full face masks, which fit over the mouth and nose. Nasal masks, which fit over the nose. Nasal pillow or prong masks, which fit into the nostrils. If you are using a mask that fits over your nose and you tend to breathe through your mouth, a chin strap may be applied to help keep your mouth closed. Use a skin barrier to protect your skin as told by your health care provider. Some CPAP and BIPAP machines have alarms that may sound if the mask comes off or  develops a leak. If you have trouble with the mask, it is very important that you talk with your health care provider about finding a way to make the mask easier to tolerate. Do not stop using the mask. There could be a negative impact on your health if you stop using the mask. Tips for using the machine Place your CPAP or BIPAP machine on a secure table or stand near an electrical outlet. Know where the on/off switch is on the machine. Follow instructions from your health care provider about how to set the pressure on your machine and when you should use it. Do not eat or drink while the CPAP or BIPAP machine is on. Food or fluids could get pushed into your lungs by the pressure of the CPAP or BIPAP. For home use, CPAP and BIPAP machines can be rented or purchased through home health care companies. Many different brands of machines are available. Renting a machine before purchasing may help you find out which particular machine works well for you. Your health insurance company may also decide which machine you may get. Keep the CPAP or BIPAP machine and attachments clean. Ask your health care provider for specific instructions. Check the humidifier if you have a dry stuffy nose or nosebleeds. Make sure it is working correctly. Follow these instructions at home: Take over-the-counter and prescription medicines only as told by your health care provider. Ask if you can take sinus medicine if your sinuses are blocked. Do not use any products that contain nicotine or tobacco. These products include cigarettes, chewing tobacco, and vaping devices, such as e-cigarettes. If you need help quitting, ask your health care provider. Keep all follow-up visits. This is important. Contact a health care provider if: You have redness or pressure sores on your head, face, mouth, or nose from the mask or head gear. You have trouble using the CPAP or BIPAP machine. You cannot tolerate wearing the CPAP or BIPAP  mask. Someone tells you that you snore even when wearing your CPAP or BIPAP. Get help right away if: You have trouble breathing. You feel confused. Summary CPAP and BIPAP are methods that use air pressure to keep your airways open and to help you breathe well. If you have trouble with the mask, it is very important that you talk with your health care provider about finding a way to make the mask easier to tolerate. Do not stop using the mask. There could be a negative impact to your health if you stop using the mask. Follow instructions from your health care provider about when to use the machine. This information is not intended to replace advice given to you by your health care provider. Make sure you discuss any questions you have with your health care provider. Document Revised: 08/17/2020 Document Reviewed: 12/18/2019 Elsevier Patient Education  2022 [employer].

## 2021-03-10 DIAGNOSIS — Z79891 Long term (current) use of opiate analgesic: Secondary | ICD-10-CM | POA: Diagnosis not present

## 2021-03-10 DIAGNOSIS — M961 Postlaminectomy syndrome, not elsewhere classified: Secondary | ICD-10-CM | POA: Diagnosis not present

## 2021-03-10 DIAGNOSIS — G894 Chronic pain syndrome: Secondary | ICD-10-CM | POA: Diagnosis not present

## 2021-03-10 DIAGNOSIS — M5416 Radiculopathy, lumbar region: Secondary | ICD-10-CM | POA: Diagnosis not present

## 2021-03-10 DIAGNOSIS — M542 Cervicalgia: Secondary | ICD-10-CM | POA: Diagnosis not present

## 2021-03-10 DIAGNOSIS — Z5181 Encounter for therapeutic drug level monitoring: Secondary | ICD-10-CM | POA: Diagnosis not present

## 2021-04-18 ENCOUNTER — Encounter: Payer: Medicare Other | Admitting: Internal Medicine

## 2021-04-18 ENCOUNTER — Ambulatory Visit: Payer: Medicare Other

## 2021-04-28 ENCOUNTER — Encounter: Payer: Medicare Other | Admitting: Internal Medicine

## 2021-05-02 DIAGNOSIS — Z20822 Contact with and (suspected) exposure to covid-19: Secondary | ICD-10-CM | POA: Diagnosis not present

## 2021-05-05 ENCOUNTER — Ambulatory Visit (INDEPENDENT_AMBULATORY_CARE_PROVIDER_SITE_OTHER): Payer: Medicare Other | Admitting: Internal Medicine

## 2021-05-05 ENCOUNTER — Encounter: Payer: Self-pay | Admitting: Internal Medicine

## 2021-05-05 VITALS — BP 138/84 | HR 71 | Temp 97.3°F | Ht 72.0 in | Wt 288.0 lb

## 2021-05-05 DIAGNOSIS — Z8639 Personal history of other endocrine, nutritional and metabolic disease: Secondary | ICD-10-CM

## 2021-05-05 DIAGNOSIS — E66812 Obesity, class 2: Secondary | ICD-10-CM

## 2021-05-05 DIAGNOSIS — E782 Mixed hyperlipidemia: Secondary | ICD-10-CM | POA: Diagnosis not present

## 2021-05-05 DIAGNOSIS — Z6839 Body mass index (BMI) 39.0-39.9, adult: Secondary | ICD-10-CM

## 2021-05-05 DIAGNOSIS — Z23 Encounter for immunization: Secondary | ICD-10-CM | POA: Diagnosis not present

## 2021-05-05 DIAGNOSIS — Z125 Encounter for screening for malignant neoplasm of prostate: Secondary | ICD-10-CM | POA: Diagnosis not present

## 2021-05-05 DIAGNOSIS — R7303 Prediabetes: Secondary | ICD-10-CM

## 2021-05-05 DIAGNOSIS — Z0000 Encounter for general adult medical examination without abnormal findings: Secondary | ICD-10-CM

## 2021-05-05 DIAGNOSIS — R350 Frequency of micturition: Secondary | ICD-10-CM | POA: Diagnosis not present

## 2021-05-05 DIAGNOSIS — N401 Enlarged prostate with lower urinary tract symptoms: Secondary | ICD-10-CM

## 2021-05-05 MED ORDER — DICLOFENAC SODIUM 1 % EX GEL: 2.0000 g | Freq: Four times a day (QID) | CUTANEOUS | 1 refills | Status: AC

## 2021-05-05 NOTE — Addendum Note (Signed)
Addended by: Ashley Royalty E on: 05/05/2021 03:32 PM ? ? Modules accepted: Orders ? ?

## 2021-05-05 NOTE — Progress Notes (Signed)
HPI: ? ?Pt presents to the clinic today for his subsequent annual Medicare Wellness Exam. ? ?Past Medical History:  ?Diagnosis Date  ? Anxiety   ? Arthritis   ? Backache, unspecified   ? CAD (coronary artery disease)   ? a. 12/2014 NSTEMI/PCI: LM nl, LAD 85p/m (2.5x22 Resolute Integrity DES), D1 50ost, D2/3 small/nl, LCX 60d, OM1 nl, OM2 min irregs, OM3 nl, RCA 120m(likely culprilt w/ L->R, R->R collats-->Med Rx). Nl EF.  ? Depressive disorder, not elsewhere classified   ? Gout   ? History of echocardiogram   ? a. 12/2014 Echo: EF 60-65%, no rwma, mild MR. Nl RV fxn.  ? Hyperlipidemia   ? Hypertension   ? Lipoma of back 02/09/2015  ? Measles   ? Mobitz (type) II atrioventricular block   ? Mumps   ? Other hemochromatosis 12/05/2016  ? Sleep apnea   ? ? ?Current Outpatient Medications  ?Medication Sig Dispense Refill  ? aspirin 81 MG chewable tablet Chew 1 tablet (81 mg total) by mouth daily. 30 tablet 3  ? atorvastatin (LIPITOR) 20 MG tablet Take 1 tablet (20 mg total) by mouth daily. 90 tablet 3  ? cetirizine (ZYRTEC) 10 MG tablet Take 1 tablet (10 mg total) by mouth as needed for allergies. Reported on 02/09/2015 90 tablet 1  ? clopidogrel (PLAVIX) 75 MG tablet Take 1 tablet (75 mg total) by mouth daily. 90 tablet 3  ? diclofenac (VOLTAREN) 75 MG EC tablet Take 75 mg by mouth 2 (two) times daily.    ? DULoxetine (CYMBALTA) 60 MG capsule Take 1 capsule (60 mg total) by mouth 2 (two) times daily. 180 capsule 0  ? ezetimibe (ZETIA) 10 MG tablet Take 1 tablet (10 mg total) by mouth daily. 90 tablet 3  ? oxyCODONE-acetaminophen (PERCOCET) 10-325 MG tablet Take 1 tablet by mouth daily as needed for pain.     ? triamcinolone (NASACORT) 55 MCG/ACT AERO nasal inhaler Place 2 sprays into the nose daily. 1 Inhaler 12  ? valsartan-hydrochlorothiazide (DIOVAN-HCT) 160-12.5 MG tablet Take 1 tablet by mouth daily. 90 tablet 3  ? ?No current facility-administered medications for this visit.  ? ? ?Allergies  ?Allergen Reactions  ?  Buspirone Other (See Comments)  ?  Disliked the way it made him feel ?Other reaction(s): Unknown ?Disliked the way it made him feel ?Disliked the way it made him feel  ? Levaquin [Levofloxacin] Rash  ? ? ?Family History  ?Problem Relation Age of Onset  ? Stroke Mother   ? Liver disease Father   ? Lung cancer Sister   ? Diabetes Sister   ? Liver disease Brother   ? Lung cancer Paternal Grandmother   ? ? ?Social History  ? ?Socioeconomic History  ? Marital status: Married  ?  Spouse name: Not on file  ? Number of children: Not on file  ? Years of education: Not on file  ? Highest education level: Not on file  ?Occupational History  ? Not on file  ?Tobacco Use  ? Smoking status: Never  ? Smokeless tobacco: Current  ?  Types: Snuff  ?Vaping Use  ? Vaping Use: Never used  ?Substance and Sexual Activity  ? Alcohol use: Yes  ?  Alcohol/week: 2.0 - 3.0 standard drinks  ?  Types: 2 - 3 Cans of beer per week  ?  Comment: ocassionally  ? Drug use: No  ? Sexual activity: Yes  ?Other Topics Concern  ? Not on file  ?Social History Narrative  ?  Not on file  ? ?Social Determinants of Health  ? ?Financial Resource Strain: Not on file  ?Food Insecurity: Not on file  ?Transportation Needs: Not on file  ?Physical Activity: Not on file  ?Stress: Not on file  ?Social Connections: Not on file  ?Intimate Partner Violence: Not on file  ? ? ?Hospitiliaztions: None ? ?Health Maintenance: ?  ? Flu: 09/2020 ? Tetanus: 01/2018 ? Pneumovax:  ? Prevnar: 07/2018 ? Zostavax: 04/2015 ?Shingrix: never ? Covid: Moderna x 2 ? PSA: > 1 year ago ? Colon Screening: 01/2017 ? Eye Doctor: as needed ? Dental Exam: biannually ?  ?Providers: ? ? PCP: Webb Silversmith, NP ? Pain Management: Dr. Randon Goldsmith ? Neurosurgery: Dr. Lacinda Axon ? Cardiology: Dr. Rockey Situ ? Pulmonologist: Dr. Elsworth Soho ? ? ?I have personally reviewed and have noted: ? ?1. The patient's medical and social history ?2. Their use of alcohol, tobacco or illicit drugs ?3. Their current medications and supplements ?4. The  patient's functional ability including ADL's, fall risks, home safety risks and hearing or visual impairment. ?5. Diet and physical activities ?6. Evidence for depression or mood disorder ? ?Subjective:  ? ?Review of Systems:  ? ?Constitutional: Denies fever, malaise, fatigue, headache or abrupt weight changes.  ?HEENT: Denies eye pain, eye redness, ear pain, ringing in the ears, wax buildup, runny nose, nasal congestion, bloody nose, or sore throat. ?Respiratory: Denies difficulty breathing, shortness of breath, cough or sputum production.   ?Cardiovascular: Pt reports intermittent swelling in legs. Denies chest pain, chest tightness, palpitations or swelling in the hands.  ?Gastrointestinal: Denies abdominal pain, bloating, constipation, diarrhea or blood in the stool.  ?GU: Pt reports urinary frequency. Denies urgency, pain with urination, burning sensation, blood in urine, odor or discharge. ?Musculoskeletal: Pt reports joint pain. Denies decrease in range of motion, difficulty with gait, muscle pain or joint swelling.  ?Skin: Denies redness, rashes, lesions or ulcercations.  ?Neurological: Denies dizziness, difficulty with memory, difficulty with speech or problems with balance and coordination.  ?Psych: Pt has a history of anxiety and depression. Denies SI/HI. ? ?No other specific complaints in a complete review of systems (except as listed in HPI above). ? ?Objective: ? ?PE:  ?BP 138/84 (BP Location: Left Arm, Patient Position: Sitting, Cuff Size: Large)   Pulse 71   Temp (!) 97.3 ?F (36.3 ?C) (Temporal)   Ht 6' (1.829 m)   Wt 288 lb (130.6 kg)   SpO2 99%   BMI 39.06 kg/m?  ? ?Wt Readings from Last 3 Encounters:  ?03/08/21 274 lb 3.2 oz (124.4 kg)  ?12/07/20 275 lb (124.7 kg)  ?11/28/20 275 lb 6 oz (124.9 kg)  ? ? ?General: Appears his stated age, obese, in NAD. ?Skin: Warm, dry and intact. PVD changes noted of BLE. No ulcerations noted. ?HEENT: Head: normal shape and size; Eyes: sclera white, and EOMs  intact;  ?Neck: Neck supple, trachea midline. No masses, lumps or thyromegaly present.  ?Cardiovascular: Normal rate and rhythm. S1,S2 noted.  No murmur, rubs or gallops noted. No JVD or BLE edema. No carotid bruits noted. ?Pulmonary/Chest: Normal effort and positive vesicular breath sounds. No respiratory distress. No wheezes, rales or ronchi noted.  ?Abdomen: Soft and nontender. Normal bowel sounds. No distention or masses noted. ?Musculoskeletal: Strength 5/5 BUE/BLE. No signs of joint swelling.  ?Neurological: Alert and oriented. Cranial nerves II-XII grossly intact. Coordination normal.  ?Psychiatric: Mood and affect normal. Behavior is normal. Judgment and thought content normal.  ? ? ?BMET ?   ?Component Value Date/Time  ?  NA 140 01/01/2020 1549  ? NA 141 02/02/2019 0834  ? K 4.3 01/01/2020 1549  ? CL 104 01/01/2020 1549  ? CO2 29 01/01/2020 1549  ? GLUCOSE 105 (H) 01/01/2020 1549  ? BUN 19 01/01/2020 1549  ? BUN 18 02/02/2019 0834  ? CREATININE 1.21 01/01/2020 1549  ? CALCIUM 9.7 01/01/2020 1549  ? GFRNONAA 62 01/01/2020 1549  ? GFRAA 72 01/01/2020 1549  ? ? ?Lipid Panel  ?   ?Component Value Date/Time  ? CHOL 149 02/02/2019 0834  ? TRIG 93 02/02/2019 0834  ? HDL 67 02/02/2019 0834  ? CHOLHDL 2.2 02/02/2019 0834  ? CHOLHDL 3.1 01/08/2015 0225  ? VLDL 23 01/08/2015 0225  ? LDLCALC 65 02/02/2019 0834  ? ? ?CBC ?   ?Component Value Date/Time  ? WBC 8.4 10/07/2020 1628  ? RBC 4.69 10/07/2020 1628  ? HGB 15.2 10/07/2020 1628  ? HCT 43.8 10/07/2020 1628  ? PLT 322 10/07/2020 1628  ? MCV 93.4 10/07/2020 1628  ? MCH 32.4 10/07/2020 1628  ? MCHC 34.7 10/07/2020 1628  ? RDW 12.2 10/07/2020 1628  ? LYMPHSABS 1,554 01/01/2020 1549  ? MONOABS 0.6 08/27/2018 1501  ? EOSABS 333 01/01/2020 1549  ? BASOSABS 37 01/01/2020 1549  ? ? ?Hgb A1C ?Lab Results  ?Component Value Date  ? HGBA1C 5.7 (H) 12/04/2016  ? ? ? ? ?Assessment and Plan:  ? ?Medicare Annual Wellness Visit: ? ?Diet: He does eat meat. He consumes fruits and  veggies. He does eat some fried foods. He drinks mostly tea. ?Physical activity: Sedentary ?Depression/mood screen: Chronic, PHQ 9 score of 1 ?Hearing: Intact to whispered voice ?Visual acuity: Grossly normal, pe

## 2021-05-05 NOTE — Patient Instructions (Signed)
Health Maintenance, Male Adopting a healthy lifestyle and getting preventive care are important in promoting health and wellness. Ask your health care provider about: The right schedule for you to have regular tests and exams. Things you can do on your own to prevent diseases and keep yourself healthy. What should I know about diet, weight, and exercise? Eat a healthy diet  Eat a diet that includes plenty of vegetables, fruits, low-fat dairy products, and lean protein. Do not eat a lot of foods that are high in solid fats, added sugars, or sodium. Maintain a healthy weight Body mass index (BMI) is a measurement that can be used to identify possible weight problems. It estimates body fat based on height and weight. Your health care provider can help determine your BMI and help you achieve or maintain a healthy weight. Get regular exercise Get regular exercise. This is one of the most important things you can do for your health. Most adults should: Exercise for at least 150 minutes each week. The exercise should increase your heart rate and make you sweat (moderate-intensity exercise). Do strengthening exercises at least twice a week. This is in addition to the moderate-intensity exercise. Spend less time sitting. Even light physical activity can be beneficial. Watch cholesterol and blood lipids Have your blood tested for lipids and cholesterol at 68 years of age, then have this test every 5 years. You may need to have your cholesterol levels checked more often if: Your lipid or cholesterol levels are high. You are older than 68 years of age. You are at high risk for heart disease. What should I know about cancer screening? Many types of cancers can be detected early and may often be prevented. Depending on your health history and family history, you may need to have cancer screening at various ages. This may include screening for: Colorectal cancer. Prostate cancer. Skin cancer. Lung  cancer. What should I know about heart disease, diabetes, and high blood pressure? Blood pressure and heart disease High blood pressure causes heart disease and increases the risk of stroke. This is more likely to develop in people who have high blood pressure readings or are overweight. Talk with your health care provider about your target blood pressure readings. Have your blood pressure checked: Every 3-5 years if you are 18-39 years of age. Every year if you are 40 years old or older. If you are between the ages of 65 and 75 and are a current or former smoker, ask your health care provider if you should have a one-time screening for abdominal aortic aneurysm (AAA). Diabetes Have regular diabetes screenings. This checks your fasting blood sugar level. Have the screening done: Once every three years after age 45 if you are at a normal weight and have a low risk for diabetes. More often and at a younger age if you are overweight or have a high risk for diabetes. What should I know about preventing infection? Hepatitis B If you have a higher risk for hepatitis B, you should be screened for this virus. Talk with your health care provider to find out if you are at risk for hepatitis B infection. Hepatitis C Blood testing is recommended for: Everyone born from 1945 through 1965. Anyone with known risk factors for hepatitis C. Sexually transmitted infections (STIs) You should be screened each year for STIs, including gonorrhea and chlamydia, if: You are sexually active and are younger than 68 years of age. You are older than 68 years of age and your   health care provider tells you that you are at risk for this type of infection. Your sexual activity has changed since you were last screened, and you are at increased risk for chlamydia or gonorrhea. Ask your health care provider if you are at risk. Ask your health care provider about whether you are at high risk for HIV. Your health care provider  may recommend a prescription medicine to help prevent HIV infection. If you choose to take medicine to prevent HIV, you should first get tested for HIV. You should then be tested every 3 months for as long as you are taking the medicine. Follow these instructions at home: Alcohol use Do not drink alcohol if your health care provider tells you not to drink. If you drink alcohol: Limit how much you have to 0-2 drinks a [place]. Know how much alcohol is in your drink. In the [place] one drink equals one 12 oz bottle of beer (355 mL), one 5 oz glass of wine (148 mL), or one 1 oz glass of hard liquor (44 mL). Lifestyle Do not use any products that contain nicotine or tobacco. These products include cigarettes, chewing tobacco, and vaping devices, such as e-cigarettes. If you need help quitting, ask your health care provider. Do not use street drugs. Do not share needles. Ask your health care provider for help if you need support or information about quitting drugs. General instructions Schedule regular health, dental, and eye exams. Stay current with your vaccines. Tell your health care provider if: You often feel depressed. You have ever been abused or do not feel safe at home. Summary Adopting a healthy lifestyle and getting preventive care are important in promoting health and wellness. Follow your health care provider's instructions about healthy diet, exercising, and getting tested or screened for diseases. Follow your health care provider's instructions on monitoring your cholesterol and blood pressure. This information is not intended to replace advice given to you by your health care provider. Make sure you discuss any questions you have with your health care provider. Document Revised: 05/30/2020 Document Reviewed: 05/30/2020 Elsevier Patient Education  2023 Elsevier Inc.  

## 2021-05-05 NOTE — Assessment & Plan Note (Signed)
Encouraged diet and exercise for weight loss ?

## 2021-05-06 LAB — PSA: PSA: 0.27 ng/mL (ref <=4.00)

## 2021-05-06 LAB — COMPLETE METABOLIC PANEL WITH GFR
AG Ratio: 1.7 (calc) (ref 1.0–2.5)
ALT: 40 U/L (ref 9–46)
AST: 23 U/L (ref 10–35)
Albumin: 4.4 g/dL (ref 3.6–5.1)
Alkaline phosphatase (APISO): 60 U/L (ref 35–144)
BUN: 16 mg/dL (ref 7–25)
CO2: 25 mmol/L (ref 20–32)
Calcium: 9.2 mg/dL (ref 8.6–10.3)
Chloride: 101 mmol/L (ref 98–110)
Creat: 1.09 mg/dL (ref 0.70–1.35)
Globulin: 2.6 g/dL (calc) (ref 1.9–3.7)
Glucose, Bld: 103 mg/dL (ref 65–139)
Potassium: 4.5 mmol/L (ref 3.5–5.3)
Sodium: 138 mmol/L (ref 135–146)
Total Bilirubin: 0.5 mg/dL (ref 0.2–1.2)
Total Protein: 7 g/dL (ref 6.1–8.1)
eGFR: 74 mL/min/{1.73_m2} (ref >=60)

## 2021-05-06 LAB — CBC
HCT: 44.5 % (ref 38.5–50.0)
Hemoglobin: 15.4 g/dL (ref 13.2–17.1)
MCH: 32.4 pg (ref 27.0–33.0)
MCHC: 34.6 g/dL (ref 32.0–36.0)
MCV: 93.5 fL (ref 80.0–100.0)
MPV: 9.6 fL (ref 7.5–12.5)
Platelets: 308 10*3/uL (ref 140–400)
RBC: 4.76 10*6/uL (ref 4.20–5.80)
RDW: 12.1 % (ref 11.0–15.0)
WBC: 7.1 10*3/uL (ref 3.8–10.8)

## 2021-05-06 LAB — LIPID PANEL
Cholesterol: 148 mg/dL (ref <200)
HDL: 56 mg/dL (ref >=40)
LDL Cholesterol (Calc): 67 mg/dL (calc)
Non-HDL Cholesterol (Calc): 92 mg/dL (calc) (ref <130)
Total CHOL/HDL Ratio: 2.6 (calc) (ref <5.0)
Triglycerides: 172 mg/dL — ABNORMAL HIGH (ref <150)

## 2021-05-06 LAB — IRON,TIBC AND FERRITIN PANEL
%SAT: 45 % (calc) (ref 20–48)
Ferritin: 279 ng/mL (ref 24–380)
Iron: 140 ug/dL (ref 50–180)
TIBC: 313 mcg/dL (calc) (ref 250–425)

## 2021-05-06 LAB — HEMOGLOBIN A1C
Hgb A1c MFr Bld: 6 % of total Hgb — ABNORMAL HIGH (ref <5.7)
Mean Plasma Glucose: 126 mg/dL
eAG (mmol/L): 7 mmol/L

## 2021-05-08 DIAGNOSIS — Z20822 Contact with and (suspected) exposure to covid-19: Secondary | ICD-10-CM | POA: Diagnosis not present

## 2021-05-23 DIAGNOSIS — Z20822 Contact with and (suspected) exposure to covid-19: Secondary | ICD-10-CM | POA: Diagnosis not present

## 2021-05-25 DIAGNOSIS — Z20822 Contact with and (suspected) exposure to covid-19: Secondary | ICD-10-CM | POA: Diagnosis not present

## 2021-06-22 DIAGNOSIS — Z5181 Encounter for therapeutic drug level monitoring: Secondary | ICD-10-CM | POA: Diagnosis not present

## 2021-06-22 DIAGNOSIS — M5416 Radiculopathy, lumbar region: Secondary | ICD-10-CM | POA: Diagnosis not present

## 2021-06-22 DIAGNOSIS — M5441 Lumbago with sciatica, right side: Secondary | ICD-10-CM | POA: Diagnosis not present

## 2021-06-22 DIAGNOSIS — Z79891 Long term (current) use of opiate analgesic: Secondary | ICD-10-CM | POA: Diagnosis not present

## 2021-06-22 DIAGNOSIS — M961 Postlaminectomy syndrome, not elsewhere classified: Secondary | ICD-10-CM | POA: Diagnosis not present

## 2021-06-22 DIAGNOSIS — G894 Chronic pain syndrome: Secondary | ICD-10-CM | POA: Diagnosis not present

## 2021-06-22 DIAGNOSIS — M542 Cervicalgia: Secondary | ICD-10-CM | POA: Diagnosis not present

## 2021-07-21 DIAGNOSIS — Z6837 Body mass index (BMI) 37.0-37.9, adult: Secondary | ICD-10-CM | POA: Diagnosis not present

## 2021-07-21 DIAGNOSIS — M25561 Pain in right knee: Secondary | ICD-10-CM | POA: Diagnosis not present

## 2021-07-31 DIAGNOSIS — Z6839 Body mass index (BMI) 39.0-39.9, adult: Secondary | ICD-10-CM | POA: Diagnosis not present

## 2021-07-31 DIAGNOSIS — M25561 Pain in right knee: Secondary | ICD-10-CM | POA: Diagnosis not present

## 2021-07-31 DIAGNOSIS — M2391 Unspecified internal derangement of right knee: Secondary | ICD-10-CM | POA: Diagnosis not present

## 2021-08-08 DIAGNOSIS — M2391 Unspecified internal derangement of right knee: Secondary | ICD-10-CM | POA: Diagnosis not present

## 2021-08-08 DIAGNOSIS — S83231A Complex tear of medial meniscus, current injury, right knee, initial encounter: Secondary | ICD-10-CM | POA: Diagnosis not present

## 2021-08-08 DIAGNOSIS — M25461 Effusion, right knee: Secondary | ICD-10-CM | POA: Diagnosis not present

## 2021-08-14 DIAGNOSIS — M1711 Unilateral primary osteoarthritis, right knee: Secondary | ICD-10-CM | POA: Diagnosis not present

## 2021-08-14 DIAGNOSIS — S83241A Other tear of medial meniscus, current injury, right knee, initial encounter: Secondary | ICD-10-CM | POA: Diagnosis not present

## 2021-09-08 DIAGNOSIS — S8001XA Contusion of right knee, initial encounter: Secondary | ICD-10-CM | POA: Diagnosis not present

## 2021-09-08 DIAGNOSIS — M1711 Unilateral primary osteoarthritis, right knee: Secondary | ICD-10-CM | POA: Diagnosis not present

## 2021-09-22 DIAGNOSIS — M5416 Radiculopathy, lumbar region: Secondary | ICD-10-CM | POA: Diagnosis not present

## 2021-09-22 DIAGNOSIS — M1711 Unilateral primary osteoarthritis, right knee: Secondary | ICD-10-CM | POA: Diagnosis not present

## 2021-09-22 DIAGNOSIS — G894 Chronic pain syndrome: Secondary | ICD-10-CM | POA: Diagnosis not present

## 2021-09-22 DIAGNOSIS — Z79891 Long term (current) use of opiate analgesic: Secondary | ICD-10-CM | POA: Diagnosis not present

## 2021-09-22 DIAGNOSIS — E669 Obesity, unspecified: Secondary | ICD-10-CM | POA: Diagnosis not present

## 2021-09-22 DIAGNOSIS — M542 Cervicalgia: Secondary | ICD-10-CM | POA: Diagnosis not present

## 2021-09-22 DIAGNOSIS — M961 Postlaminectomy syndrome, not elsewhere classified: Secondary | ICD-10-CM | POA: Diagnosis not present

## 2021-09-22 DIAGNOSIS — M109 Gout, unspecified: Secondary | ICD-10-CM | POA: Diagnosis not present

## 2021-09-22 DIAGNOSIS — Z5181 Encounter for therapeutic drug level monitoring: Secondary | ICD-10-CM | POA: Diagnosis not present

## 2021-09-26 ENCOUNTER — Ambulatory Visit (INDEPENDENT_AMBULATORY_CARE_PROVIDER_SITE_OTHER): Payer: Medicare Other | Admitting: Internal Medicine

## 2021-09-26 ENCOUNTER — Encounter: Payer: Self-pay | Admitting: Internal Medicine

## 2021-09-26 VITALS — BP 132/80 | HR 71 | Temp 98.0°F | Wt 278.0 lb

## 2021-09-26 DIAGNOSIS — M10071 Idiopathic gout, right ankle and foot: Secondary | ICD-10-CM

## 2021-09-26 MED ORDER — COLCHICINE 0.6 MG PO TABS: ORAL_TABLET | ORAL | 0 refills | Status: DC

## 2021-09-26 NOTE — Progress Notes (Signed)
Subjective:    Patient ID: Robert Greer, male    DOB: October 20, 1955, 66 y.o.   MRN: [id number]  HPI  Patient presents to clinic today with complaint of right great toe pain and swelling. This started 4 days ago. He reports pain, swelling, redness and warmth of.  He has a history of gout but is not on any prophylactic medication at this time but he did take leftover colchicine with some improvement in symptoms.  He has not had his uric acid level checked in 5 years.  Review of Systems  Past Medical History:  Diagnosis Date   Anxiety    Arthritis    Backache, unspecified    CAD (coronary artery disease)    a. 12/2014 NSTEMI/PCI: LM nl, LAD 85p/m (2.5x22 Resolute Integrity DES), D1 50ost, D2/3 small/nl, LCX 60d, OM1 nl, OM2 min irregs, OM3 nl, RCA 164m(likely culprilt w/ L->R, R->R collats-->Med Rx). Nl EF.   Depressive disorder, not elsewhere classified    Gout    History of echocardiogram    a. 12/2014 Echo: EF 60-65%, no rwma, mild MR. Nl RV fxn.   Hyperlipidemia    Hypertension    Lipoma of back 02/09/2015   Measles    Mobitz (type) II atrioventricular block    Mumps    Other hemochromatosis 12/05/2016   Sleep apnea     Current Outpatient Medications  Medication Sig Dispense Refill   aspirin 81 MG chewable tablet Chew 1 tablet (81 mg total) by mouth daily. 30 tablet 3   atorvastatin (LIPITOR) 20 MG tablet Take 1 tablet (20 mg total) by mouth daily. 90 tablet 3   cetirizine (ZYRTEC) 10 MG tablet Take 1 tablet (10 mg total) by mouth as needed for allergies. Reported on 02/09/2015 90 tablet 1   clopidogrel (PLAVIX) 75 MG tablet Take 1 tablet (75 mg total) by mouth daily. 90 tablet 3   diclofenac (VOLTAREN) 75 MG EC tablet Take 75 mg by mouth 2 (two) times daily.     diclofenac Sodium (VOLTAREN) 1 % GEL Apply 2 g topically 4 (four) times daily. 100 g 1   DULoxetine (CYMBALTA) 60 MG capsule Take 1 capsule (60 mg total) by mouth 2 (two) times daily. 180 capsule 0   ezetimibe (ZETIA) 10  MG tablet Take 1 tablet (10 mg total) by mouth daily. 90 tablet 3   oxyCODONE-acetaminophen (PERCOCET) 10-325 MG tablet Take 1 tablet by mouth daily as needed for pain.      triamcinolone (NASACORT) 55 MCG/ACT AERO nasal inhaler Place 2 sprays into the nose daily. 1 Inhaler 12   valsartan-hydrochlorothiazide (DIOVAN-HCT) 160-12.5 MG tablet Take 1 tablet by mouth daily. 90 tablet 3   No current facility-administered medications for this visit.    Allergies  Allergen Reactions   Buspirone Other (See Comments)    Disliked the way it made him feel Other reaction(s): Unknown Disliked the way it made him feel Disliked the way it made him feel   Levaquin [Levofloxacin] Rash    Family History  Problem Relation Age of Onset   Stroke Mother    Liver disease Father    Lung cancer Sister    Diabetes Sister    Liver disease Brother    Lung cancer Paternal Grandmother     Social History   Socioeconomic History   Marital status: Married    Spouse name: Not on file   Number of children: Not on file   Years of education: Not on file  Highest education level: Not on file  Occupational History   Not on file  Tobacco Use   Smoking status: Never   Smokeless tobacco: Current    Types: Snuff  Vaping Use   Vaping Use: Never used  Substance and Sexual Activity   Alcohol use: Yes    Alcohol/week: 2.0 - 3.0 standard drinks of alcohol    Types: 2 - 3 Cans of beer per week    Comment: ocassionally   Drug use: No   Sexual activity: Yes  Other Topics Concern   Not on file  Social History Narrative   Not on file   Social Determinants of Health   Financial Resource Strain: Low Risk  (04/12/2020)   Overall Financial Resource Strain (CARDIA)    Difficulty of Paying Living Expenses: Not hard at all  Food Insecurity: No Food Insecurity (04/12/2020)   Hunger Vital Sign    Worried About Running Out of Food in the Last Year: Never true    Ran Out of Food in the Last Year: Never true   Transportation Needs: No Transportation Needs (04/12/2020)   PRAPARE - Hydrologist (Medical): No    Lack of Transportation (Non-Medical): No  Physical Activity: Inactive (04/12/2020)   Exercise Vital Sign    Days of Exercise per Week: 0 days    Minutes of Exercise per Session: 0 min  Stress: No Stress Concern Present (04/12/2020)   [place]    Feeling of Stress : Not at all  Social Connections: Not on file  Intimate Partner Violence: Not on file     Constitutional: Denies fever, malaise, fatigue, headache or abrupt weight changes.  Respiratory: Denies difficulty breathing, shortness of breath, cough or sputum production.   Cardiovascular: Denies chest pain, chest tightness, palpitations or swelling in the hands or feet.  Musculoskeletal: Pt reports right great toe pain and swelling. Denies decrease in range of motion, difficulty with gait, muscle pain.  Skin: Pt reports redness and warmth of right great toe. Denies rashes, lesions or ulcercations.    No other specific complaints in a complete review of systems (except as listed in HPI above).     Objective:   Physical Exam BP 132/80 (BP Location: Left Arm, Patient Position: Sitting, Cuff Size: Large)   Pulse 71   Temp 98 F (36.7 C) (Oral)   Wt 278 lb (126.1 kg)   SpO2 99%   BMI 37.70 kg/m   Wt Readings from Last 3 Encounters:  09/26/21 278 lb (126.1 kg)  05/05/21 288 lb (130.6 kg)  03/08/21 274 lb 3.2 oz (124.4 kg)    General: Appears his stated age, obese, in NAD. Skin: Redness and warmth noted of the right MTP. Cardiovascular: Normal rate. Pulmonary/Chest: Normal effort and positive vesicular breath sounds.  Musculoskeletal: Pain and swelling noted of the right MTP.  Limping gait. Neurological: Alert and oriented.   BMET    Component Value Date/Time   NA 138 05/05/2021 1520   NA 141 02/02/2019 0834   K 4.5  05/05/2021 1520   CL 101 05/05/2021 1520   CO2 25 05/05/2021 1520   GLUCOSE 103 05/05/2021 1520   BUN 16 05/05/2021 1520   BUN 18 02/02/2019 0834   CREATININE 1.09 05/05/2021 1520   CALCIUM 9.2 05/05/2021 1520   GFRNONAA 62 01/01/2020 1549   GFRAA 72 01/01/2020 1549    Lipid Panel     Component Value Date/Time  CHOL 148 05/05/2021 1520   CHOL 149 02/02/2019 0834   TRIG 172 (H) 05/05/2021 1520   HDL 56 05/05/2021 1520   HDL 67 02/02/2019 0834   CHOLHDL 2.6 05/05/2021 1520   VLDL 23 01/08/2015 0225   LDLCALC 67 05/05/2021 1520    CBC    Component Value Date/Time   WBC 7.1 05/05/2021 1520   RBC 4.76 05/05/2021 1520   HGB 15.4 05/05/2021 1520   HCT 44.5 05/05/2021 1520   PLT 308 05/05/2021 1520   MCV 93.5 05/05/2021 1520   MCH 32.4 05/05/2021 1520   MCHC 34.6 05/05/2021 1520   RDW 12.1 05/05/2021 1520   LYMPHSABS 1,554 01/01/2020 1549   MONOABS 0.6 08/27/2018 1501   EOSABS 333 01/01/2020 1549   BASOSABS 37 01/01/2020 1549    Hgb A1C Lab Results  Component Value Date   HGBA1C 6.0 (H) 05/05/2021           Assessment & Plan:   Gout, Right MTP:  We will check uric acid at next visit Rx for colchicine sent to pharmacy Encouraged low purine diet Encouraged elevation  RTC in 1 month for follow-up of chronic conditions Webb Silversmith, NP

## 2021-09-26 NOTE — Patient Instructions (Signed)
Gout  Gout is painful swelling of your joints. Gout is a type of arthritis. It is caused by having too much uric acid in your body. Uric acid is a chemical that is made when your body breaks down substances called purines. If your body has too much uric acid, sharp crystals can form and build up in your joints. This causes pain and swelling. Gout attacks can happen quickly and be very painful (acute gout). Over time, the attacks can affect more joints and happen more often (chronic gout). What are the causes? Gout is caused by too much uric acid in your blood. This can happen because: Your kidneys do not remove enough uric acid from your blood. Your body makes too much uric acid. You eat too many foods that are high in purines. These foods include organ meats, some seafood, and beer. Trauma or stress can bring on an attack. What increases the risk? Having a family history of gout. Being male and middle-aged. Being male and having gone through menopause. Having an organ transplant. Taking certain medicines. Having certain conditions, such as: Being very overweight (obese). Lead poisoning. Kidney disease. A skin condition called psoriasis. Other risks include: Losing weight too quickly. Not having enough water in the body (being dehydrated). Drinking alcohol, especially beer. Drinking beverages that are sweetened with a type of sugar called fructose. What are the signs or symptoms? An attack of acute gout often starts at night and usually happens in just one joint. The most common place is the big toe. Other joints that may be affected include joints of the feet, ankle, knee, fingers, wrist, or elbow. Symptoms may include: Very bad pain. Warmth. Swelling. Stiffness. Tenderness. The affected joint may be very painful to touch. Shiny, red, or purple skin. Chills and fever. Chronic gout may cause symptoms more often. More joints may be involved. You may also have white or yellow lumps  (tophi) on your hands or feet or in other areas near your joints. How is this treated? Treatment for an acute attack may include medicines for pain and swelling, such as: NSAIDs, such as ibuprofen. Steroids taken by mouth or injected into a joint. Colchicine. This can be given by mouth or through an IV tube. Treatment to prevent future attacks may include: Taking small doses of NSAIDs or colchicine daily. Using a medicine that reduces uric acid levels in your blood, such as allopurinol. Making changes to your diet. You may need to see a food expert (dietitian) about what to eat and drink to prevent gout. Follow these instructions at home: During a gout attack  If told, put ice on the painful area. To do this: Put ice in a plastic bag. Place a towel between your skin and the bag. Leave the ice on for 20 minutes, 2-3 times a [place]. Take off the ice if your skin turns bright red. This is very important. If you cannot feel pain, heat, or cold, you have a greater risk of damage to the area. Raise the painful joint above the level of your heart as often as you can. Rest the joint as much as possible. If the joint is in your leg, you may be given crutches. Follow instructions from your doctor about what you cannot eat or drink. Avoiding future gout attacks Eat a low-purine diet. Avoid foods and drinks such as: Liver. Kidney. Anchovies. Asparagus. Herring. Mushrooms. Mussels. Beer. Stay at a healthy weight. If you want to lose weight, talk with your doctor. Do not   lose weight too fast. Start or continue an exercise plan as told by your doctor. Eating and drinking Avoid drinks sweetened by fructose. Drink enough fluids to keep your pee (urine) pale yellow. If you drink alcohol: Limit how much you have to: 0-1 drink a [place] for women who are not pregnant. 0-2 drinks a [place] for men. Know how much alcohol is in a drink. In the [place] one drink equals one 12 oz bottle of beer (355 mL), one 5 oz  glass of wine (148 mL), or one 1 oz glass of hard liquor (44 mL). General instructions Take over-the-counter and prescription medicines only as told by your doctor. Ask your doctor if you should avoid driving or using machines while you are taking your medicine. Return to your normal activities when your doctor says that it is safe. Keep all follow-up visits. Where to find more information National Institutes of Health: www.niams.nih.gov Contact a doctor if: You have another gout attack. You still have symptoms of a gout attack after 10 days of treatment. You have problems (side effects) because of your medicines. You have chills or a fever. You have burning pain when you pee (urinate). You have pain in your lower back or belly. Get help right away if: You have very bad pain. Your pain cannot be controlled. You cannot pee. Summary Gout is painful swelling of the joints. The most common site of pain is the big toe, but it can affect other joints. Medicines and avoiding some foods can help to prevent and treat gout attacks. This information is not intended to replace advice given to you by your health care provider. Make sure you discuss any questions you have with your health care provider. Document Revised: 10/12/2020 Document Reviewed: 10/12/2020 Elsevier Patient Education  2023 Elsevier Inc.  

## 2021-09-29 ENCOUNTER — Ambulatory Visit: Payer: Medicare Other | Admitting: Physician Assistant

## 2021-10-09 ENCOUNTER — Telehealth: Payer: Self-pay | Admitting: Cardiovascular Disease

## 2021-10-09 MED ORDER — EZETIMIBE 10 MG PO TABS: 10.0000 mg | ORAL_TABLET | Freq: Every day | ORAL | 0 refills | Status: DC

## 2021-10-09 MED ORDER — CLOPIDOGREL BISULFATE 75 MG PO TABS: 75.0000 mg | ORAL_TABLET | Freq: Every day | ORAL | 0 refills | Status: DC

## 2021-10-09 NOTE — Telephone Encounter (Signed)
*  STAT* If patient is at the pharmacy, call can be transferred to refill team.   1. Which medications need to be refilled? (please list name of each medication and dose if known)  clopidogrel (PLAVIX) 75 MG tablet ezetimibe (ZETIA) 10 MG tablet  2. Which pharmacy/location (including street and city if local pharmacy) is medication to be sent to? [place], [place] - [address]  3. Do they need a [address] or [address] supply?  [address] supply

## 2021-10-09 NOTE — Telephone Encounter (Signed)
Requested Prescriptions   Signed Prescriptions Disp Refills   clopidogrel (PLAVIX) 75 MG tablet 90 tablet 0    Sig: Take 1 tablet (75 mg total) by mouth daily.    Authorizing Provider: Minna Merritts    Ordering User: Raelene Bott, Murle Otting L   ezetimibe (ZETIA) 10 MG tablet 90 tablet 0    Sig: Take 1 tablet (10 mg total) by mouth daily.    Authorizing Provider: Minna Merritts    Ordering User: Raelene Bott, Brittney Caraway L

## 2021-10-18 DIAGNOSIS — B351 Tinea unguium: Secondary | ICD-10-CM | POA: Diagnosis not present

## 2021-10-18 DIAGNOSIS — L905 Scar conditions and fibrosis of skin: Secondary | ICD-10-CM | POA: Diagnosis not present

## 2021-10-18 DIAGNOSIS — L821 Other seborrheic keratosis: Secondary | ICD-10-CM | POA: Diagnosis not present

## 2021-10-18 DIAGNOSIS — R21 Rash and other nonspecific skin eruption: Secondary | ICD-10-CM | POA: Diagnosis not present

## 2021-10-18 DIAGNOSIS — D225 Melanocytic nevi of trunk: Secondary | ICD-10-CM | POA: Diagnosis not present

## 2021-10-18 DIAGNOSIS — L853 Xerosis cutis: Secondary | ICD-10-CM | POA: Diagnosis not present

## 2021-10-18 DIAGNOSIS — L578 Other skin changes due to chronic exposure to nonionizing radiation: Secondary | ICD-10-CM | POA: Diagnosis not present

## 2021-10-26 ENCOUNTER — Ambulatory Visit (INDEPENDENT_AMBULATORY_CARE_PROVIDER_SITE_OTHER): Payer: Medicare Other | Admitting: Internal Medicine

## 2021-10-26 ENCOUNTER — Encounter: Payer: Self-pay | Admitting: Internal Medicine

## 2021-10-26 VITALS — BP 152/94 | HR 82 | Temp 97.0°F | Wt 278.0 lb

## 2021-10-26 DIAGNOSIS — G4733 Obstructive sleep apnea (adult) (pediatric): Secondary | ICD-10-CM | POA: Diagnosis not present

## 2021-10-26 DIAGNOSIS — Z23 Encounter for immunization: Secondary | ICD-10-CM

## 2021-10-26 DIAGNOSIS — I252 Old myocardial infarction: Secondary | ICD-10-CM | POA: Diagnosis not present

## 2021-10-26 DIAGNOSIS — G8929 Other chronic pain: Secondary | ICD-10-CM

## 2021-10-26 DIAGNOSIS — F419 Anxiety disorder, unspecified: Secondary | ICD-10-CM | POA: Diagnosis not present

## 2021-10-26 DIAGNOSIS — Z6837 Body mass index (BMI) 37.0-37.9, adult: Secondary | ICD-10-CM

## 2021-10-26 DIAGNOSIS — F32A Depression, unspecified: Secondary | ICD-10-CM

## 2021-10-26 DIAGNOSIS — M1A0710 Idiopathic chronic gout, right ankle and foot, without tophus (tophi): Secondary | ICD-10-CM

## 2021-10-26 DIAGNOSIS — K219 Gastro-esophageal reflux disease without esophagitis: Secondary | ICD-10-CM | POA: Diagnosis not present

## 2021-10-26 DIAGNOSIS — R7989 Other specified abnormal findings of blood chemistry: Secondary | ICD-10-CM

## 2021-10-26 DIAGNOSIS — M5440 Lumbago with sciatica, unspecified side: Secondary | ICD-10-CM

## 2021-10-26 DIAGNOSIS — E782 Mixed hyperlipidemia: Secondary | ICD-10-CM | POA: Diagnosis not present

## 2021-10-26 DIAGNOSIS — I25118 Atherosclerotic heart disease of native coronary artery with other forms of angina pectoris: Secondary | ICD-10-CM

## 2021-10-26 DIAGNOSIS — M159 Polyosteoarthritis, unspecified: Secondary | ICD-10-CM

## 2021-10-26 DIAGNOSIS — R7303 Prediabetes: Secondary | ICD-10-CM

## 2021-10-26 DIAGNOSIS — I10 Essential (primary) hypertension: Secondary | ICD-10-CM | POA: Diagnosis not present

## 2021-10-26 NOTE — Assessment & Plan Note (Signed)
C-Met and lipid profile today Continue atorvastatin, ezetimibe, aspirin and Plavix Encouraged low fat diet and exercise for weight loss 

## 2021-10-26 NOTE — Assessment & Plan Note (Signed)
Uric acid level today Continue colchicine as needed

## 2021-10-26 NOTE — Assessment & Plan Note (Signed)
Encourage regular stretching and core strengthening Continue duloxetine, Voltaren gel and oxycodone as prescribed by pain management

## 2021-10-26 NOTE — Assessment & Plan Note (Signed)
Continue duloxetine, Voltaren gel and oxycodone Encourage weight loss as this can help reduce joint pain

## 2021-10-26 NOTE — Progress Notes (Signed)
Subjective:    Patient ID: Robert Greer, male    DOB: [birth year], 66 y.o.   MRN: [id number]  HPI  Patient presents to clinic today for follow-up of chronic conditions.  HTN: His BP today is 148/90.  He is taking Valsartan HCT as prescribed.  ECG from 11/2020 reviewed.  OSA: He averages 7 hours of sleep per night with the use of his CPAP.  Sleep study from 01/2021 reviewed.  GERD: He is not sure what triggers this. He takes Rolaids as needed with good relief of symptoms.  Upper GI from 01/2017 reviewed.  OA/Chronic Low Back Pain: Generalized.  He takes Duloxetine, uses Voltaren gel, Oxycodone as needed with good relief of symptoms.  He does not follow with orthopedics but he follows with pain clinic.  HLD with CAD status post MI: His last LDL was 67, triglycerides 172, 04/2021.  He denies myalgias on Atorvastatin, Ezetimibe.  He is taking Aspirin and Plavix as well.  Prediabetes: His last A1c was 6%, 04/2021.  He is not taking any oral diabetic medication this time.  He does not check his sugars.  Anxiety and Depression: Chronic, managed on Duloxetine. He is not currently seeing a therapist. He denies SI/HI.  Gout: He denies recent flare. He has Colchicine to take if needed. He does not follow with rheumatology.  Review of Systems   Past Medical History:  Diagnosis Date   Anxiety    Arthritis    Backache, unspecified    CAD (coronary artery disease)    a. 12/2014 NSTEMI/PCI: LM nl, LAD 85p/m (2.5x22 Resolute Integrity DES), D1 50ost, D2/3 small/nl, LCX 60d, OM1 nl, OM2 min irregs, OM3 nl, RCA 135m(likely culprilt w/ L->R, R->R collats-->Med Rx). Nl EF.   Depressive disorder, not elsewhere classified    Gout    History of echocardiogram    a. 12/2014 Echo: EF 60-65%, no rwma, mild MR. Nl RV fxn.   Hyperlipidemia    Hypertension    Lipoma of back 02/09/2015   Measles    Mobitz (type) II atrioventricular block    Mumps    Other hemochromatosis 12/05/2016   Sleep apnea      Current Outpatient Medications  Medication Sig Dispense Refill   aspirin 81 MG chewable tablet Chew 1 tablet (81 mg total) by mouth daily. 30 tablet 3   atorvastatin (LIPITOR) 20 MG tablet Take 1 tablet (20 mg total) by mouth daily. 90 tablet 3   cetirizine (ZYRTEC) 10 MG tablet Take 1 tablet (10 mg total) by mouth as needed for allergies. Reported on 02/09/2015 90 tablet 1   clopidogrel (PLAVIX) 75 MG tablet Take 1 tablet (75 mg total) by mouth daily. 90 tablet 0   colchicine 0.6 MG tablet Take 2 tabs PO at the first sign of a gout attack followed by 1 tab PO 1 hour later. 30 tablet 0   diclofenac (VOLTAREN) 75 MG EC tablet Take 75 mg by mouth 2 (two) times daily.     diclofenac Sodium (VOLTAREN) 1 % GEL Apply 2 g topically 4 (four) times daily. 100 g 1   DULoxetine (CYMBALTA) 60 MG capsule Take 1 capsule (60 mg total) by mouth 2 (two) times daily. 180 capsule 0   ezetimibe (ZETIA) 10 MG tablet Take 1 tablet (10 mg total) by mouth daily. 90 tablet 0   oxyCODONE-acetaminophen (PERCOCET) 10-325 MG tablet Take 1 tablet by mouth daily as needed for pain.      triamcinolone (NASACORT) 55 MCG/ACT AERO  nasal inhaler Place 2 sprays into the nose daily. 1 Inhaler 12   valsartan-hydrochlorothiazide (DIOVAN-HCT) 160-12.5 MG tablet Take 1 tablet by mouth daily. 90 tablet 3   No current facility-administered medications for this visit.    Allergies  Allergen Reactions   Buspirone Other (See Comments)    Disliked the way it made him feel Other reaction(s): Unknown Disliked the way it made him feel Disliked the way it made him feel   Levaquin [Levofloxacin] Rash    Family History  Problem Relation Age of Onset   Stroke Mother    Liver disease Father    Lung cancer Sister    Diabetes Sister    Liver disease Brother    Lung cancer Paternal Grandmother     Social History   Socioeconomic History   Marital status: Married    Spouse name: Not on file   Number of children: Not on file    Years of education: Not on file   Highest education level: Not on file  Occupational History   Not on file  Tobacco Use   Smoking status: Never   Smokeless tobacco: Current    Types: Snuff  Vaping Use   Vaping Use: Never used  Substance and Sexual Activity   Alcohol use: Yes    Alcohol/week: 2.0 - 3.0 standard drinks of alcohol    Types: 2 - 3 Cans of beer per week    Comment: ocassionally   Drug use: No   Sexual activity: Yes  Other Topics Concern   Not on file  Social History Narrative   Not on file   Social Determinants of Health   Financial Resource Strain: Low Risk  (04/12/2020)   Overall Financial Resource Strain (CARDIA)    Difficulty of Paying Living Expenses: Not hard at all  Food Insecurity: No Food Insecurity (04/12/2020)   Hunger Vital Sign    Worried About Running Out of Food in the Last Year: Never true    Ran Out of Food in the Last Year: Never true  Transportation Needs: No Transportation Needs (04/12/2020)   PRAPARE - Hydrologist (Medical): No    Lack of Transportation (Non-Medical): No  Physical Activity: Inactive (04/12/2020)   Exercise Vital Sign    Days of Exercise per Week: 0 days    Minutes of Exercise per Session: 0 min  Stress: No Stress Concern Present (04/12/2020)   [place]    Feeling of Stress : Not at all  Social Connections: Not on file  Intimate Partner Violence: Not on file     Constitutional: Denies fever, malaise, fatigue, headache or abrupt weight changes.  HEENT: Denies eye pain, eye redness, ear pain, ringing in the ears, wax buildup, runny nose, nasal congestion, bloody nose, or sore throat. Respiratory: Denies difficulty breathing, shortness of breath, cough or sputum production.   Cardiovascular: Denies chest pain, chest tightness, palpitations or swelling in the hands or feet.  Gastrointestinal: Pt reports intermittent reflux. Denies  abdominal pain, bloating, constipation, diarrhea or blood in the stool.  GU: Denies urgency, frequency, pain with urination, burning sensation, blood in urine, odor or discharge. Musculoskeletal: Pt reports chronic low back pain, joint pain. Denies decrease in range of motion, difficulty with gait, muscle pain or joint swelling.  Skin: Denies redness, rashes, lesions or ulcercations.  Neurological: Denies dizziness, difficulty with memory, difficulty with speech or problems with balance and coordination.  Psych: Pt has a  history of anxiety and depression. Denies SI/HI.  No other specific complaints in a complete review of systems (except as listed in HPI above).     Objective:   Physical Exam  BP (!) 152/94 (BP Location: Left Arm, Patient Position: Sitting, Cuff Size: Large)   Pulse 82   Temp (!) 97 F (36.1 C) (Temporal)   Wt 278 lb (126.1 kg)   SpO2 99%   BMI 37.70 kg/m   Wt Readings from Last 3 Encounters:  09/26/21 278 lb (126.1 kg)  05/05/21 288 lb (130.6 kg)  03/08/21 274 lb 3.2 oz (124.4 kg)    General: Appears his stated age, obese, in NAD. Skin: Warm, dry and intact.  HEENT: Head: normal shape and size; Eyes: sclera white, no icterus, conjunctiva pink, PERRLA and EOMs intact;  Cardiovascular: Normal rate and rhythm. S1,S2 noted.  No murmur, rubs or gallops noted. No JVD or BLE edema. No carotid bruits noted. Pulmonary/Chest: Normal effort and positive vesicular breath sounds. No respiratory distress. No wheezes, rales or ronchi noted.  Abdomen: Normal bowel sounds.  Musculoskeletal: No pain with palpation of lumbar spine.  Joint enlargement noted of his right knee.  He has obvious difficulty getting from a sitting to a standing position and stepping up within the exam table.  No difficulty with gait.  Neurological: Alert and oriented.  Psych: Mood and affect normal.  Behavior is normal.  Judgment and thought content normal.  BMET    Component Value Date/Time   NA 138  05/05/2021 1520   NA 141 02/02/2019 0834   K 4.5 05/05/2021 1520   CL 101 05/05/2021 1520   CO2 25 05/05/2021 1520   GLUCOSE 103 05/05/2021 1520   BUN 16 05/05/2021 1520   BUN 18 02/02/2019 0834   CREATININE 1.09 05/05/2021 1520   CALCIUM 9.2 05/05/2021 1520   GFRNONAA 62 01/01/2020 1549   GFRAA 72 01/01/2020 1549    Lipid Panel     Component Value Date/Time   CHOL 148 05/05/2021 1520   CHOL 149 02/02/2019 0834   TRIG 172 (H) 05/05/2021 1520   HDL 56 05/05/2021 1520   HDL 67 02/02/2019 0834   CHOLHDL 2.6 05/05/2021 1520   VLDL 23 01/08/2015 0225   LDLCALC 67 05/05/2021 1520    CBC    Component Value Date/Time   WBC 7.1 05/05/2021 1520   RBC 4.76 05/05/2021 1520   HGB 15.4 05/05/2021 1520   HCT 44.5 05/05/2021 1520   PLT 308 05/05/2021 1520   MCV 93.5 05/05/2021 1520   MCH 32.4 05/05/2021 1520   MCHC 34.6 05/05/2021 1520   RDW 12.1 05/05/2021 1520   LYMPHSABS 1,554 01/01/2020 1549   MONOABS 0.6 08/27/2018 1501   EOSABS 333 01/01/2020 1549   BASOSABS 37 01/01/2020 1549    Hgb A1C Lab Results  Component Value Date   HGBA1C 6.0 (H) 05/05/2021           Assessment & Plan:     RTC in 6 months, follow up chronic conditions Webb Silversmith, NP

## 2021-10-26 NOTE — Assessment & Plan Note (Signed)
Continue duloxetine Support offered 

## 2021-10-26 NOTE — Patient Instructions (Signed)

## 2021-10-26 NOTE — Assessment & Plan Note (Signed)
Elevated today but he is not in significant amount of pain Continue valsartan HCT Reinforce DASH diet and exercise for weight loss C-Met today

## 2021-10-26 NOTE — Assessment & Plan Note (Signed)
Encourage weight loss as this can produce sleep apnea symptoms Continue CPAP 

## 2021-10-26 NOTE — Assessment & Plan Note (Signed)
Encourage diet and exercise for weight loss 

## 2021-10-26 NOTE — Assessment & Plan Note (Signed)
Try to identify and avoid foods that trigger reflux Encourage weight loss as this can help reduce reflux symptoms Okay to continue OTC treatment as needed if symptoms recurring >3 days weekly

## 2021-10-27 LAB — LIPID PANEL
Cholesterol: 139 mg/dL (ref <200)
HDL: 58 mg/dL (ref >=40)
LDL Cholesterol (Calc): 56 mg/dL (calc)
Non-HDL Cholesterol (Calc): 81 mg/dL (calc) (ref <130)
Total CHOL/HDL Ratio: 2.4 (calc) (ref <5.0)
Triglycerides: 170 mg/dL — ABNORMAL HIGH (ref <150)

## 2021-10-27 LAB — COMPLETE METABOLIC PANEL WITH GFR
AG Ratio: 1.9 (calc) (ref 1.0–2.5)
ALT: 39 U/L (ref 9–46)
AST: 27 U/L (ref 10–35)
Albumin: 4.6 g/dL (ref 3.6–5.1)
Alkaline phosphatase (APISO): 71 U/L (ref 35–144)
BUN/Creatinine Ratio: 10 (calc) (ref 6–22)
BUN: 14 mg/dL (ref 7–25)
CO2: 28 mmol/L (ref 20–32)
Calcium: 9.5 mg/dL (ref 8.6–10.3)
Chloride: 103 mmol/L (ref 98–110)
Creat: 1.42 mg/dL — ABNORMAL HIGH (ref 0.70–1.35)
Globulin: 2.4 g/dL (calc) (ref 1.9–3.7)
Glucose, Bld: 113 mg/dL — ABNORMAL HIGH (ref 65–99)
Potassium: 4.8 mmol/L (ref 3.5–5.3)
Sodium: 139 mmol/L (ref 135–146)
Total Bilirubin: 0.5 mg/dL (ref 0.2–1.2)
Total Protein: 7 g/dL (ref 6.1–8.1)
eGFR: 54 mL/min/{1.73_m2} — ABNORMAL LOW (ref >=60)

## 2021-10-27 LAB — HEMOGLOBIN A1C
Hgb A1c MFr Bld: 5.9 % of total Hgb — ABNORMAL HIGH (ref <5.7)
Mean Plasma Glucose: 123 mg/dL
eAG (mmol/L): 6.8 mmol/L

## 2021-10-27 NOTE — Addendum Note (Signed)
Addended by: Jearld Fenton on: 10/27/2021 10:25 AM   Modules accepted: Orders

## 2021-11-06 ENCOUNTER — Other Ambulatory Visit: Payer: Self-pay | Admitting: Cardiovascular Disease

## 2021-11-08 DIAGNOSIS — H5319 Other subjective visual disturbances: Secondary | ICD-10-CM | POA: Diagnosis not present

## 2021-11-08 DIAGNOSIS — H43811 Vitreous degeneration, right eye: Secondary | ICD-10-CM | POA: Diagnosis not present

## 2021-11-10 ENCOUNTER — Other Ambulatory Visit: Payer: Self-pay

## 2021-11-10 DIAGNOSIS — R7303 Prediabetes: Secondary | ICD-10-CM

## 2021-11-13 ENCOUNTER — Other Ambulatory Visit: Payer: Medicare Other

## 2021-11-14 DIAGNOSIS — M1711 Unilateral primary osteoarthritis, right knee: Secondary | ICD-10-CM | POA: Diagnosis not present

## 2021-11-19 DIAGNOSIS — M1711 Unilateral primary osteoarthritis, right knee: Secondary | ICD-10-CM | POA: Insufficient documentation

## 2021-12-16 DIAGNOSIS — J329 Chronic sinusitis, unspecified: Secondary | ICD-10-CM | POA: Diagnosis not present

## 2021-12-16 DIAGNOSIS — B9689 Other specified bacterial agents as the cause of diseases classified elsewhere: Secondary | ICD-10-CM | POA: Diagnosis not present

## 2021-12-22 DIAGNOSIS — Z79891 Long term (current) use of opiate analgesic: Secondary | ICD-10-CM | POA: Diagnosis not present

## 2021-12-22 DIAGNOSIS — M542 Cervicalgia: Secondary | ICD-10-CM | POA: Diagnosis not present

## 2021-12-22 DIAGNOSIS — M5416 Radiculopathy, lumbar region: Secondary | ICD-10-CM | POA: Diagnosis not present

## 2021-12-22 DIAGNOSIS — M961 Postlaminectomy syndrome, not elsewhere classified: Secondary | ICD-10-CM | POA: Diagnosis not present

## 2021-12-22 DIAGNOSIS — Z5181 Encounter for therapeutic drug level monitoring: Secondary | ICD-10-CM | POA: Diagnosis not present

## 2021-12-22 DIAGNOSIS — G894 Chronic pain syndrome: Secondary | ICD-10-CM | POA: Diagnosis not present

## 2021-12-25 NOTE — Progress Notes (Unsigned)
Date:  12/26/2021   ID:  Robert Greer, DOB 1955-10-25, MRN [id number]  Patient Location:  [place] [id number]   Provider location:   [facility], [place] office  PCP:  Jearld Fenton, NP  Cardiologist:  Patsy Baltimore  Chief Complaint  Patient presents with   12 month follow up    Patient will need a cardiac clearance for right knee surgery with Dr. Marry Guan. Medications reviewed by the patient verbally.     History of Present Illness:    Robert Greer is a 66 y.o. 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 Marry Guan  Denies significant chest pain or shortness of breath concerning for angina Remains on aspirin Plavix Cholesterol at goal, diabetes numbers at goal, non-smoker A1C 5.9 Total chol 139 , LDL 56  Continues to drive a truck periodically, gets CDL on annual basis Gave up managing 25,000 chickens approximately 3 years ago   history of right shoulder repair, back surgery, partial knee replacement  chronic back pain, stenosis  Blood pressure well controlled  Lab Results  Component Value Date   CHOL 139 10/26/2021   HDL 58 10/26/2021   LDLCALC 56 10/26/2021   TRIG 170 (H) 10/26/2021   EKG personally reviewed by myself on todays visit Normal sinus rhythm rate 71 bpm no significant ST-T wave changes  Prior CV studies:   The following studies were reviewed today:  Echocardiogram report was not completed, ejection fraction unavailable but estimated at normal, 60% with no wall motion abnormality on stress  test  Cath 2016 Mid RCA lesion, 100% stenosed. Dist Cx lesion, 60% stenosed. Ost 1st Diag to 1st Diag lesion, 50% stenosed. Prox LAD to Mid LAD lesion, 85% stenosed. Post intervention, there is a 5% residual stenosis. The left ventricular systolic function is normal.   1. Significant three-vessel coronary artery disease.  Occluded mid right coronary artery is likely the culprit for myocardial infarction.  However, there are readily right to right collaterals and left-to-right collaterals. There is a 60% distal left circumflex stenosis and an 85% proximal to mid heavily calcified LAD disease.   2. Low normal LV systolic function with mildly elevated left ventricular end-diastolic pressure.   3. Successful angioplasty and 3 overlapped drug-eluting stent placement to the proximal to mid LAD. Sluggish flow in the jailed first diagonal which improved with nitroglycerin.   It appears that the RCA occlusion is more than 54 days old and already has some faint collaterals. Thus, no PCI was performed. The distal vessel also appears to be diffusely diseased and does not seem to be a good target for intervention. I decided to intervene in the proximal to mid LAD which was heavily calcified. This was a difficult procedure. The distal left circumflex disease can be left to be treated medically. Continue indefinite dual antiplatelet therapy if possible. Aggressive treatment of risk factors.   Past Medical History:  Diagnosis Date   Anxiety    Arthritis    Backache, unspecified    CAD (coronary artery disease)  a. 12/2014 NSTEMI/PCI: LM nl, LAD 85p/m (2.5x22 Resolute Integrity DES), D1 50ost, D2/3 small/nl, LCX 60d, OM1 nl, OM2 min irregs, OM3 nl, RCA 164m(likely culprilt w/ L->R, R->R collats-->Med Rx). Nl EF.   Depressive disorder, not elsewhere classified    Gout    History of echocardiogram    a. 12/2014 Echo: EF 60-65%, no rwma, mild MR. Nl RV fxn.   Hyperlipidemia    Hypertension     Lipoma of back 02/09/2015   Measles    Mobitz (type) II atrioventricular block    Mumps    Other hemochromatosis 12/05/2016   Sleep apnea    Past Surgical History:  Procedure Laterality Date   ANTERIOR FUSION LUMBAR SPINE     BACK SURGERY  2012   x2   CARDIAC CATHETERIZATION N/A 01/10/2015   Procedure: Left Heart Cath and Coronary Angiography;  Surgeon: MWellington Hampshire MD;  [facility];  Service: Cardiovascular;  Laterality: N/A;   CARDIAC CATHETERIZATION N/A 01/10/2015   Procedure: Coronary Stent Intervention;  Surgeon: MWellington Hampshire MD;  [facility];  Service: Cardiovascular;  Laterality: N/A;   COLONOSCOPY  2005   COLONOSCOPY WITH PROPOFOL N/A 02/14/2017   Procedure: COLONOSCOPY WITH PROPOFOL;  Surgeon: AJonathon Bellows MD;  [facility];  Service: Gastroenterology;  Laterality: N/A;   ESOPHAGOGASTRODUODENOSCOPY (EGD) WITH PROPOFOL N/A 02/14/2017   Procedure: ESOPHAGOGASTRODUODENOSCOPY (EGD) WITH PROPOFOL;  Surgeon: AJonathon Bellows MD;  [facility];  Service: Gastroenterology;  Laterality: N/A;   KNEE SURGERY     bilateral   TONSILLECTOMY AND ADENOIDECTOMY     TOTAL SHOULDER REPLACEMENT Right      Current Meds  Medication Sig   aspirin 81 MG chewable tablet Chew 1 tablet (81 mg total) by mouth daily.   atorvastatin (LIPITOR) 20 MG tablet Take 1 tablet (20 mg total) by mouth daily.   celecoxib (CELEBREX) 200 MG capsule Take 200 mg by mouth daily.   cetirizine (ZYRTEC) 10 MG tablet Take 1 tablet (10 mg total) by mouth as needed for allergies. Reported on 02/09/2015   clopidogrel (PLAVIX) 75 MG tablet Take 1 tablet (75 mg total) by mouth daily.   colchicine 0.6 MG tablet Take 2 tabs PO at the first sign of a gout attack followed by 1 tab PO 1 hour later.   diclofenac Sodium (VOLTAREN) 1 % GEL Apply 2 g topically 4 (four) times daily.   DULoxetine (CYMBALTA) 60 MG capsule Take 1 capsule (60 mg total) by mouth 2 (two) times  daily.   ezetimibe (ZETIA) 10 MG tablet Take 1 tablet (10 mg total) by mouth daily.   oxyCODONE-acetaminophen (PERCOCET) 10-325 MG tablet Take 1 tablet by mouth daily as needed for pain.    pregabalin (LYRICA) 75 MG capsule Take 75 mg by mouth 3 (three) times daily.   triamcinolone (NASACORT) 55 MCG/ACT AERO nasal inhaler Place 2 sprays into the nose daily.   valsartan-hydrochlorothiazide (DIOVAN-HCT) 160-12.5 MG tablet Take 1 tablet by mouth daily.     Allergies:   Buspirone and Levaquin [levofloxacin]   Social History   Tobacco Use   Smoking status: Never   Smokeless tobacco: Current    Types: Snuff  Vaping Use   Vaping Use: Never used  Substance Use Topics   Alcohol use: Yes    Alcohol/week: 2.0 - 3.0 standard drinks of alcohol    Types: 2 - 3 Cans of beer per week    Comment: ocassionally   Drug use:  No     Family Hx: The patient's family history includes Diabetes in his sister; Liver disease in his brother and father; Lung cancer in his paternal grandmother and sister; Stroke in his mother.  ROS:   Please see the history of present illness.    Review of Systems  Constitutional: Negative.   Respiratory: Negative.    Cardiovascular: Negative.   Gastrointestinal: Negative.   Musculoskeletal:  Positive for back pain.  Neurological: Negative.   Psychiatric/Behavioral: Negative.    All other systems reviewed and are negative.    Labs/Other Tests and Data Reviewed:    Recent Labs: 05/05/2021: Hemoglobin 15.4; Platelets 308 10/26/2021: ALT 39; BUN 14; Creat 1.42; Potassium 4.8; Sodium 139   Recent Lipid Panel Lab Results  Component Value Date/Time   CHOL 139 10/26/2021 02:27 PM   CHOL 149 02/02/2019 08:34 AM   TRIG 170 (H) 10/26/2021 02:27 PM   HDL 58 10/26/2021 02:27 PM   HDL 67 02/02/2019 08:34 AM   CHOLHDL 2.4 10/26/2021 02:27 PM   LDLCALC 56 10/26/2021 02:27 PM    Wt Readings from Last 3 Encounters:  12/26/21 281 lb (127.5 kg)  10/26/21 278 lb (126.1 kg)   09/26/21 278 lb (126.1 kg)     Exam:   BP 130/76 (BP Location: Left Arm, Patient Position: Sitting, Cuff Size: Normal)   Pulse 71   Ht 5' 11.5" (1.816 m)   Wt 281 lb (127.5 kg)   SpO2 96%   BMI 38.65 kg/m  Constitutional:  oriented to person, place, and time. No distress.  HENT:  Head: Grossly normal Eyes:  no discharge. No scleral icterus.  Neck: No JVD, no carotid bruits  Cardiovascular: Regular rate and rhythm, no murmurs appreciated Pulmonary/Chest: Clear to auscultation bilaterally, no wheezes or rails Abdominal: Soft.  no distension.  no tenderness.  Musculoskeletal: Normal range of motion Neurological:  normal muscle tone. Coordination normal. No atrophy Skin: Skin warm and dry Psychiatric: normal affect, pleasant   ASSESSMENT & PLAN:    Preop cardiovascular evaluation Acceptable risk for total knee replacement surgery with Dr. Marry Guan December 20 No further cardiac testing needed, recommend he hold Plavix 7 days prior to procedure Following procedure he may need Xarelto 10 mg or Eliquis 2.5 twice daily for DVT prophylaxis.  After this anticoagulation completed, could go back on aspirin with Plavix  Coronary artery disease of native artery of native heart with stable angina pectoris (HCC) Currently with no symptoms of angina. No further workup at this time. Continue current medication regimen.  Plan for dual antiplatelet therapy given overlapping stents  Hypertension, unspecified type Blood pressure is well controlled on today's visit. No changes made to the medications.  Mixed hyperlipidemia Cholesterol is at goal on the current lipid regimen. No changes to the medications were made.  Chronic low back pain, unspecified back pain laterality, unspecified whether sciatica present Followed by neurosurgery  back surgeries, still with chronic pain   Total encounter time more than 30 minutes  Greater than 50% was spent in counseling and coordination of care with the  patient    Signed, Ida Rogue, MD  12/26/2021 3:36 PM    [place] Office [address], [place], [place] [address]

## 2021-12-26 ENCOUNTER — Encounter: Payer: Self-pay | Admitting: Cardiovascular Disease

## 2021-12-26 ENCOUNTER — Ambulatory Visit: Payer: Medicare Other | Attending: Cardiovascular Disease | Admitting: Cardiovascular Disease

## 2021-12-26 VITALS — BP 130/76 | HR 71 | Ht 71.5 in | Wt 281.0 lb

## 2021-12-26 DIAGNOSIS — K219 Gastro-esophageal reflux disease without esophagitis: Secondary | ICD-10-CM | POA: Diagnosis not present

## 2021-12-26 DIAGNOSIS — I10 Essential (primary) hypertension: Secondary | ICD-10-CM | POA: Insufficient documentation

## 2021-12-26 DIAGNOSIS — Z6837 Body mass index (BMI) 37.0-37.9, adult: Secondary | ICD-10-CM | POA: Insufficient documentation

## 2021-12-26 DIAGNOSIS — E785 Hyperlipidemia, unspecified: Secondary | ICD-10-CM | POA: Insufficient documentation

## 2021-12-26 DIAGNOSIS — I25118 Atherosclerotic heart disease of native coronary artery with other forms of angina pectoris: Secondary | ICD-10-CM | POA: Diagnosis not present

## 2021-12-26 DIAGNOSIS — G4733 Obstructive sleep apnea (adult) (pediatric): Secondary | ICD-10-CM | POA: Insufficient documentation

## 2021-12-26 NOTE — Patient Instructions (Addendum)
Medication Instructions:  No changes  Hold plavix 7 days prior to surgery, last [place] the 12th  Talk to Dr. Marry Guan, ask about post procedure blood thinner (xarelto 10 mg daily or eliquis 2.5 mg twice as [place])  If you need a refill on your cardiac medications before your next appointment, please call your pharmacy.   Lab work: No new labs needed  Testing/Procedures: No new testing needed  Follow-Up: At [facility], you and your health needs are our priority.  As part of our continuing mission to provide you with exceptional heart care, we have created designated Provider Care Teams.  These Care Teams include your primary Cardiologist (physician) and Advanced Practice Providers (APPs -  Physician Assistants and Nurse Practitioners) who all work together to provide you with the care you need, when you need it.  You will need a follow up appointment in 12 months  Providers on your designated Care Team:   Murray Hodgkins, NP Christell Faith, PA-C Cadence Kathlen Mody, [place]  COVID-19 Vaccine Information can be found at: [email or web address] For questions related to vaccine distribution or appointments, please email [email or web address] or call [phone].

## 2021-12-28 NOTE — Discharge Instructions (Signed)
Instructions after Total Knee Replacement   Robert Greer P. Amato Sevillano, Jr., M.D.     Dept. of Orthopaedics & Sports Medicine  [facility]  [address]  [place], [place]  [address]  Phone: [phone]   Fax: [phone]    DIET: Drink plenty of non-alcoholic fluids. Resume your normal diet. Include foods high in fiber.  ACTIVITY:  You may use crutches or a walker with weight-bearing as tolerated, unless instructed otherwise. You may be weaned off of the walker or crutches by your Physical Therapist.  Do NOT place pillows under the knee. Anything placed under the knee could limit your ability to straighten the knee.   Continue doing gentle exercises. Exercising will reduce the pain and swelling, increase motion, and prevent muscle weakness.   Please continue to use the TED compression stockings for 6 weeks. You may remove the stockings at night, but should reapply them in the morning. Do not drive or operate any equipment until instructed.  WOUND CARE:  Continue to use the PolarCare or ice packs periodically to reduce pain and swelling. You may bathe or shower after the staples are removed at the first office visit following surgery.  MEDICATIONS: You may resume your regular medications. Please take the pain medication as prescribed on the medication. Do not take pain medication on an empty stomach. You have been given a prescription for a blood thinner (Lovenox or Coumadin). Please take the medication as instructed. (NOTE: After completing a 2 week course of Lovenox, take one Enteric-coated aspirin once a [place]. This along with elevation will help reduce the possibility of phlebitis in your operated leg.) Do not drive or drink alcoholic beverages when taking pain medications.  CALL THE OFFICE FOR: Temperature above 101 degrees Excessive bleeding or drainage on the dressing. Excessive swelling, coldness, or paleness of the toes. Persistent nausea and vomiting.  FOLLOW-UP:  You  should have an appointment to return to the office in 10-14 days after surgery. Arrangements have been made for continuation of Physical Therapy (either home therapy or outpatient therapy).   [facility] Department Directory         [email or web address]       [email or web address]          Cardiology  Appointments: [place] - [phone] [place] - [phone]  Endocrinology  Appointments: [place] - [phone] [place] - [phone]  Gastroenterology  Appointments: [place] - [phone] [place] - [phone]        General Surgery   Appointments: [place] - [phone]  Internal Medicine/Family Medicine  Appointments: [place] - [phone] [place] - [phone] [place] - [phone]  Metabolic and Weigh Loss Surgery  Appointments: [place] - [phone]        Neurology  Appointments: [place] - [phone] [place] - [phone]  Neurosurgery  Appointments: [place] - [phone]  Obstetrics & Gynecology  Appointments: [place] - [phone] [place] - [phone]        Pediatrics  Appointments: [place] - [phone] [place] - [phone]  Physiatry  Appointments: [place] -[phone]  Physical Therapy  Appointments: [place] - [phone] [place] - [phone]        Podiatry  Appointments: [place] - [phone] [place] - [phone]  Pulmonology  Appointments: [place] - [phone]  Rheumatology  Appointments: [place] - [phone]        [place] Location: [facility]  [address] , [place]  [address]  [place] Location: [facility] [address] [place], [place]  [address]  [place] Location: [facility] [address] [place], [place]  [address]    

## 2021-12-29 ENCOUNTER — Encounter: Payer: Self-pay | Admitting: Urgent Care

## 2021-12-29 ENCOUNTER — Encounter
Admission: RE | Admit: 2021-12-29 | Discharge: 2021-12-29 | Disposition: A | Discharge status: Home / Self Care | Payer: Medicare Other | Source: Ambulatory Visit | Attending: Orthopedic Surgery | Admitting: Orthopedic Surgery

## 2021-12-29 VITALS — BP 113/79 | HR 74 | Resp 18 | Ht 71.0 in | Wt 278.4 lb

## 2021-12-29 DIAGNOSIS — Z01818 Encounter for other preprocedural examination: Secondary | ICD-10-CM | POA: Insufficient documentation

## 2021-12-29 DIAGNOSIS — E782 Mixed hyperlipidemia: Secondary | ICD-10-CM | POA: Diagnosis not present

## 2021-12-29 DIAGNOSIS — M1711 Unilateral primary osteoarthritis, right knee: Secondary | ICD-10-CM | POA: Insufficient documentation

## 2021-12-29 DIAGNOSIS — Z01812 Encounter for preprocedural laboratory examination: Secondary | ICD-10-CM

## 2021-12-29 HISTORY — DX: Obstructive sleep apnea (adult) (pediatric): G47.33

## 2021-12-29 HISTORY — DX: Obesity, unspecified: E66.9

## 2021-12-29 HISTORY — DX: Gastro-esophageal reflux disease without esophagitis: K21.9

## 2021-12-29 LAB — URINALYSIS, ROUTINE W REFLEX MICROSCOPIC
Bacteria, UA: NONE SEEN
Bilirubin Urine: NEGATIVE
Glucose, UA: NEGATIVE mg/dL
Ketones, ur: NEGATIVE mg/dL
Leukocytes,Ua: NEGATIVE
Nitrite: NEGATIVE
Protein, ur: NEGATIVE mg/dL
Specific Gravity, Urine: 1.02 (ref 1.005–1.030)
pH: 5 (ref 5.0–8.0)

## 2021-12-29 LAB — COMPREHENSIVE METABOLIC PANEL
ALT: 39 U/L (ref 0–44)
AST: 31 U/L (ref 15–41)
Albumin: 4.4 g/dL (ref 3.5–5.0)
Alkaline Phosphatase: 63 U/L (ref 38–126)
Anion gap: 8 (ref 5–15)
BUN: 17 mg/dL (ref 8–23)
CO2: 28 mmol/L (ref 22–32)
Calcium: 9.1 mg/dL (ref 8.9–10.3)
Chloride: 104 mmol/L (ref 98–111)
Creatinine, Ser: 1.19 mg/dL (ref 0.61–1.24)
GFR, Estimated: >60 mL/min (ref >60)
Glucose, Bld: 105 mg/dL — ABNORMAL HIGH (ref 70–99)
Potassium: 4.1 mmol/L (ref 3.5–5.1)
Sodium: 140 mmol/L (ref 135–145)
Total Bilirubin: 0.7 mg/dL (ref 0.3–1.2)
Total Protein: 7.5 g/dL (ref 6.5–8.1)

## 2021-12-29 LAB — CBC
HCT: 45.5 % (ref 39.0–52.0)
Hemoglobin: 15.1 g/dL (ref 13.0–17.0)
MCH: 31.7 pg (ref 26.0–34.0)
MCHC: 33.2 g/dL (ref 30.0–36.0)
MCV: 95.4 fL (ref 80.0–100.0)
Platelets: 275 10*3/uL (ref 150–400)
RBC: 4.77 MIL/uL (ref 4.22–5.81)
RDW: 11.9 % (ref 11.5–15.5)
WBC: 6.8 10*3/uL (ref 4.0–10.5)
nRBC: 0 % (ref 0.0–0.2)

## 2021-12-29 LAB — SURGICAL PCR SCREEN
MRSA, PCR: NEGATIVE
Staphylococcus aureus: NEGATIVE

## 2021-12-29 LAB — TYPE AND SCREEN
ABO/RH(D): A POS
Antibody Screen: NEGATIVE

## 2021-12-29 LAB — SEDIMENTATION RATE: Sed Rate: 25 mm/hr — ABNORMAL HIGH (ref 0–20)

## 2021-12-29 LAB — C-REACTIVE PROTEIN: CRP: 0.8 mg/dL (ref <1.0)

## 2021-12-29 NOTE — Patient Instructions (Addendum)
Your procedure is scheduled on:01-10-22 Wednesday Report to the Registration Desk on the 1st floor of the [facility].Then proceed to the 2nd floor Surgery Desk To find out your arrival time, please call [phone] between 1PM - 3PM on:01-09-22 Tuesday If your arrival time is 6:00 am, do not arrive prior to that time as the [facility] entrance doors do not open until 6:00 am.  REMEMBER: Instructions that are not followed completely may result in serious medical risk, up to and including death; or upon the discretion of your surgeon and anesthesiologist your surgery may need to be rescheduled.  Do not eat food after midnight the night before surgery.  No gum chewing, lozengers or hard candies.  You may however, drink CLEAR liquids up to 2 hours before you are scheduled to arrive for your surgery. Do not drink anything within 2 hours of your scheduled arrival time.  Clear liquids include: - water  - apple juice without pulp - gatorade (not RED colors) - black coffee or tea (Do NOT add milk or creamers to the coffee or tea) Do NOT drink anything that is not on this list.  In addition, your doctor has ordered for you to drink the provided  Gatorade G2 Drinking this carbohydrate drink up to two hours before surgery helps to reduce insulin resistance and improve patient outcomes. Please complete drinking 2 hours prior to scheduled arrival time.  TAKE THESE MEDICATIONS THE MORNING OF SURGERY WITH A SIP OF WATER: -DULoxetine (CYMBALTA)  -pregabalin (LYRICA)  -ezetimibe (ZETIA)  -atorvastatin (LIPITOR)   Stop your clopidogrel (PLAVIX) 7 days prior to surgery as instructed by Dr. Jacques Navy. Rodney Booze dose will be on 01-02-22 Tuesday  Continue your 81 mg Aspirin up until the [place] prior to surgery-Do NOT take the morning of surgery  One week prior to surgery: Stop Anti-inflammatories (NSAIDS) such as Advil, Aleve, Ibuprofen, Motrin, Naproxen, Naprosyn and Aspirin based products such as  Excedrin, Goodys Powder, BC Powder.You may however, continue to take Tylenol/Percocet  if needed for pain up until the [place] of surgery. You can continue your celecoxib (CELEBREX) up until the [place] prior to your surgery  Stop ANY OVER THE COUNTER supplements/vitamins 7 days prior to surgery   No Alcohol for 24 hours before or after surgery.  No Smoking including e-cigarettes for 24 hours prior to surgery.  No chewable tobacco products for at least 6 hours prior to surgery.  No nicotine patches on the [place] of surgery.  Do not use any "recreational" drugs for at least a week prior to your surgery.  Please be advised that the combination of cocaine and anesthesia may have negative outcomes, up to and including death. If you test positive for cocaine, your surgery will be cancelled.  On the morning of surgery brush your teeth with toothpaste and water, you may rinse your mouth with mouthwash if you wish. Do not swallow any toothpaste or mouthwash.  Use CHG Soap as directed on instruction sheet.  Do not wear jewelry, make-up, hairpins, clips or nail polish.  Do not wear lotions, powders, or perfumes.   Do not shave body from the neck down 48 hours prior to surgery just in case you cut yourself which could leave a site for infection.  Also, freshly shaved skin may become irritated if using the CHG soap.  Contact lenses, hearing aids and dentures may not be worn into surgery.  Do not bring valuables to the hospital. [facility] is not responsible for any missing/lost belongings  or valuables.   Bring your Bi-PAP to the hospital with you  Notify your doctor if there is any change in your medical condition (cold, fever, infection).  Wear comfortable clothing (specific to your surgery type) to the hospital.  After surgery, you can help prevent lung complications by doing breathing exercises.  Take deep breaths and cough every 1-2 hours. Your doctor may order a device called an Incentive  Spirometer to help you take deep breaths. When coughing or sneezing, hold a pillow firmly against your incision with both hands. This is called "splinting." Doing this helps protect your incision. It also decreases belly discomfort.  If you are being admitted to the hospital overnight, leave your suitcase in the car. After surgery it may be brought to your room.  If you are being discharged the [place] of surgery, you will not be allowed to drive home. You will need a responsible adult (18 years or older) to drive you home and stay with you that night.   If you are taking public transportation, you will need to have a responsible adult (18 years or older) with you. Please confirm with your physician that it is acceptable to use public transportation.   Please call the [place] Dept. at [phone] if you have any questions about these instructions.  Surgery Visitation Policy:  Patients undergoing a surgery or procedure may have two family members or support persons with them as long as the person is not COVID-19 positive or experiencing its symptoms.   Inpatient Visitation:    Visiting hours are 7 a.m. to 8 p.m. Up to four visitors are allowed at one time in a patient room. The visitors may rotate out with other people during the [place]. One designated support person (adult) may remain overnight.  Due to an increase in RSV and influenza rates and associated hospitalizations, children ages 92 and under will not be able to visit patients in [facility]. Masks continue to be strongly recommended.   How to Use an Incentive Spirometer An incentive spirometer is a tool that measures how well you are filling your lungs with each breath. Learning to take long, deep breaths using this tool can help you keep your lungs clear and active. This may help to reverse or lessen your chance of developing breathing (pulmonary) problems, especially infection. You may be asked to use a  spirometer: After a surgery. If you have a lung problem or a history of smoking. After a long period of time when you have been unable to move or be active. If the spirometer includes an indicator to show the highest number that you have reached, your health care provider or respiratory therapist will help you set a goal. Keep a log of your progress as told by your health care provider. What are the risks? Breathing too quickly may cause dizziness or cause you to pass out. Take your time so you do not get dizzy or light-headed. If you are in pain, you may need to take pain medicine before doing incentive spirometry. It is harder to take a deep breath if you are having pain. How to use your incentive spirometer  Sit up on the edge of your bed or on a chair. Hold the incentive spirometer so that it is in an upright position. Before you use the spirometer, breathe out normally. Place the mouthpiece in your mouth. Make sure your lips are closed tightly around it. Breathe in slowly and as deeply as you can  through your mouth, causing the piston or the ball to rise toward the top of the chamber. Hold your breath for 3-5 seconds, or for as long as possible. If the spirometer includes a coach indicator, use this to guide you in breathing. Slow down your breathing if the indicator goes above the marked areas. Remove the mouthpiece from your mouth and breathe out normally. The piston or ball will return to the bottom of the chamber. Rest for a few seconds, then repeat the steps 10 or more times. Take your time and take a few normal breaths between deep breaths so that you do not get dizzy or light-headed. Do this every 1-2 hours when you are awake. If the spirometer includes a goal marker to show the highest number you have reached (best effort), use this as a goal to work toward during each repetition. After each set of 10 deep breaths, cough a few times. This will help to make sure that your lungs are  clear. If you have an incision on your chest or abdomen from surgery, place a pillow or a rolled-up towel firmly against the incision when you cough. This can help to reduce pain while taking deep breaths and coughing. General tips When you are able to get out of bed: Walk around often. Continue to take deep breaths and cough in order to clear your lungs. Keep using the incentive spirometer until your health care provider says it is okay to stop using it. If you have been in the hospital, you may be told to keep using the spirometer at home. Contact a health care provider if: You are having difficulty using the spirometer. You have trouble using the spirometer as often as instructed. Your pain medicine is not giving enough relief for you to use the spirometer as told. You have a fever. Get help right away if: You develop shortness of breath. You develop a cough with bloody mucus from the lungs. You have fluid or blood coming from an incision site after you cough. Summary An incentive spirometer is a tool that can help you learn to take long, deep breaths to keep your lungs clear and active. You may be asked to use a spirometer after a surgery, if you have a lung problem or a history of smoking, or if you have been inactive for a long period of time. Use your incentive spirometer as instructed every 1-2 hours while you are awake. If you have an incision on your chest or abdomen, place a pillow or a rolled-up towel firmly against your incision when you cough. This will help to reduce pain. Get help right away if you have shortness of breath, you cough up bloody mucus, or blood comes from your incision when you cough. This information is not intended to replace advice given to you by your health care provider. Make sure you discuss any questions you have with your health care provider. Document Revised: 03/30/2019 Document Reviewed: 03/30/2019 Elsevier Patient Education  [place].

## 2022-01-02 DIAGNOSIS — M1711 Unilateral primary osteoarthritis, right knee: Secondary | ICD-10-CM | POA: Diagnosis not present

## 2022-01-03 ENCOUNTER — Encounter: Payer: Self-pay | Admitting: Orthopedic Surgery

## 2022-01-03 NOTE — Progress Notes (Signed)
Perioperative Services  Pre-Admission/Anesthesia Testing Clinical Review  Date: 01/04/22  Patient Demographics:  Name: Robert Greer DOB:   28-Mar-1955 MRN:   [id number]  Planned Surgical Procedure(s):    Case: [id number] Date/Time: 01/10/22 1100   Procedure: COMPUTER ASSISTED TOTAL KNEE ARTHROPLASTY (Right: Knee)   Anesthesia type: Choice   Pre-op diagnosis: PRIMARY OSTEOARTHRITIS OF RIGHT KNEE.   Location: [facility] OR ROOM 01 / [place] [facility]   Surgeons: Robert Leep, MD   NOTE: Available PAT nursing documentation and vital signs have been reviewed. Clinical nursing staff has updated patient's PMH/PSHx, current medication list, and drug allergies/intolerances to ensure comprehensive history available to assist in medical decision making as it pertains to the aforementioned surgical procedure and anticipated anesthetic course. Extensive review of available clinical information performed. Robert Greer PMH and PSHx updated with any diagnoses/procedures that  may have been inadvertently omitted during his intake with the pre-admission testing department's nursing staff.  Clinical Discussion:  Robert Greer is a 66 y.o. male who is submitted for pre-surgical anesthesia review and clearance prior to him undergoing the above procedure. Patient has never been a smoker. Pertinent PMH includes: CAD, NSTEMI, Mobitz 2 AV block, HTN, HLD, OSAH (requires nocturnal BiPAP therapy), GERD (no daily Tx), hemochromatosis, OA, back pain, depression, anxiety.  Patient is followed by cardiology Robert Situ, MD). He was last seen in the cardiology clinic on 12/26/2021; notes reviewed.  At the time of his clinic visit, patient doing well overall from a cardiovascular perspective.  He denied any recent episodes of chest pain, shortness of breath, PND, orthopnea, palpitations, significant peripheral edema, vertiginous symptoms, or presyncope/syncope.  Patient with a past medical history significant for  cardiovascular diagnoses.  Patient suffered an NSTEMI on 01/07/2015.  Troponins were trended: 0.63 --> 3.16 --> 10.08 --> 9.73 ng/L.    TTE was performed on 01/07/2018 revealing a normal left ventricular systolic function with an EF of 60 to 65%.  There were no regional wall motion abnormalities.  Right ventricular size and function normal.  There was mild mitral valve regurgitation.  All transvalvular gradients were noted to be normal with no evidence suggestive of valvular stenosis.  Study was determined to be normal.  Diagnostic LEFT heart catheterization was performed on 01/10/2015.  Revealing multivessel CAD; 100% mid RCA, 60% distal LCx, 50% ostial D1-D1, and 85% proximal to mid LAD.  PCI was subsequently performed placing overlapping 2.75 x 22 mm (proximal LAD), 2.5 x 22 mm (mid LAD), and 2.75 x 12 mm (mid LAD) Resolute Integrity DES.  Procedure yielded excellent angiographic result and TIMI-3 flow.  Exercise tolerance test performed on 02/02/2019, at which time patient was able to achieve 9.0 METS following 7 minutes and 21 seconds of exercise.  Heart rate 141 bpm, which was 90% of the MPHR.  Peak blood pressure 200/97 mmHg.  There were no significant ST or T wave abnormalities at peak stress or in recovery.  Study determined to be normal overall.  Due to previous PCI requiring placement of overlapping stents, patient remains on daily DAPT therapy (ASA + clopidogrel).  He is reported to be compliant with prescribed medications with no evidence or reports of GI bleeding.  Blood pressure reasonably controlled at 130/76 mmHg on currently prescribed ARB (valsartan) and diuretic (HCTZ) therapies.  Patient is on atorvastatin + ezetimibe for his HLD diagnosis and further ASCVD prevention.  He is not diabetic.  Patient does have a significant OSAH diagnosis that is successfully being treated with nocturnal  BiPAP therapy; compliant with therapy.  Patient maintains an active lifestyle.  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 Functional capacity, as defined by DASI, is documented as being >/= 4 METS.  No changes were made to his medication regimen.  Patient to follow-up with outpatient cardiology in 1 year or sooner if needed.  Robert Greer is scheduled for an elective COMPUTER ASSISTED RIGHT TOTAL KNEE ARTHROPLASTY on 01/10/2022 with Dr. Skip Estimable, MD given patient's past medical history significant for cardiovascular diagnoses, presurgical cardiac clearance was sought by the PAT team.  Per cardiology, "ACCEPTABLE risk for total knee replacement surgery with Dr. Marry Guan. No further cardiac testing needed, recommend he hold Plavix 7 days prior to procedure. Following procedure he may need Xarelto 10 mg or Eliquis 2.5 twice daily for DVT prophylaxis.  After this anticoagulation completed, could go back on aspirin with Plavix".  Per recommendations from his cardiologist, patient has been advised that his last dose of clopidogrel should be on 01/02/2022.  Patient will continue his daily low-dose ASA throughout his perioperative course.  Patient reports previous perioperative complications with anesthesia in the past.  While unsure of the details, patient reporting that he experienced (+) cardiac arrest during a back surgery performed in [place], [place]. In review of the available records, it is noted that patient underwent a general anesthetic course here at [facility] (ASA III) in 01/2017 without documented complications.      12/29/2021    1:32 PM 12/26/2021    3:05 PM 10/26/2021    2:13 PM  Vitals with BMI  Height '5\' 11"'$  5' 11.5"   Weight 278 lbs 7 oz 281 lbs   BMI 76.16 07.37   Systolic 106 269 485  Diastolic 79 76 94  Pulse 74 71     Providers/Specialists:   NOTE: Primary physician provider listed below. Patient may have been seen by APP or partner within same practice.   PROVIDER ROLE / SPECIALTY LAST OV  Robert Greer, Robert Record, MD  Orthopedics (Surgeon) 01/02/2022  Robert Fenton, NP Primary Care Provider 10/26/2021  Robert Rogue, MD Cardiology 12/26/2021   Allergies:  Buspirone and Levaquin [levofloxacin]  Current Home Medications:   No current facility-administered medications for this encounter.    aspirin 81 MG chewable tablet   atorvastatin (LIPITOR) 20 MG tablet   celecoxib (CELEBREX) 200 MG capsule   cetirizine (ZYRTEC) 10 MG tablet   clopidogrel (PLAVIX) 75 MG tablet   colchicine 0.6 MG tablet   diclofenac Sodium (VOLTAREN) 1 % GEL   DULoxetine (CYMBALTA) 60 MG capsule   ezetimibe (ZETIA) 10 MG tablet   oxyCODONE-acetaminophen (PERCOCET) 10-325 MG tablet   pregabalin (LYRICA) 75 MG capsule   triamcinolone (NASACORT) 55 MCG/ACT AERO nasal inhaler   valsartan-hydrochlorothiazide (DIOVAN-HCT) 160-12.5 MG tablet   History:   Past Medical History:  Diagnosis Date   Anxiety    Arthritis    Back pain    CAD (coronary artery disease)    a.) NSTEMI 01/07/2015; b.) LHC/PCI 01/10/2015: LM nl, LAD 85p/m (overlapping 2.75 x 22 mm (pLAD), 2.5 x 22 mm (mLAD), and 2.75 x 12 mm (mLAD) Resolute Integrity DES), D1 50ost, D2/3 small/nl, LCX 60d, OM1 nl, OM2 min irregs, OM3 nl, RCA 119m(likely culprit w/ L->R, R->R collats-->Med Rx). Nl EF.   Complication of anesthesia 2010   a.) cardiac arrest in [place] during [place] "never told me why"   Depressive  disorder, not elsewhere classified    GERD (gastroesophageal reflux disease)    Gout    History of echocardiogram    a. 12/2014 Echo: EF 60-65%, no rwma, mild MR. Nl RV fxn.   Hyperlipidemia    Hypertension    Lipoma of back 02/09/2015   Long term current use of antithrombotics/antiplatelets    a.) on DAPT (ASA + clopidogrel)   Measles    Mobitz (type) II atrioventricular block    Mumps    NSTEMI (non-ST elevated myocardial infarction) ([place]) 01/07/2015   a.) troponins were trended: 0.63 --> 3.16 --> 10.08 --> 9.73; b.) LHC/PCI 01/10/2015  --> overlapping 2.75 x 22 mm (pLAD), 2.5 x 22 mm (mLAD), and 2.75 x 12 mm (mLAD) Resolute Integrity DES   Obesity    OSA treated with BiPAP    Other hemochromatosis 12/05/2016   Past Surgical History:  Procedure Laterality Date   ANTERIOR FUSION LUMBAR SPINE     BACK SURGERY  2012   x2   CARDIAC CATHETERIZATION N/A 01/10/2015   Procedure: Left Heart Cath and Coronary Angiography;  Surgeon: Robert Hampshire, MD;  Location: [facility];  Service: Cardiovascular;  Laterality: N/A;   CARDIAC CATHETERIZATION N/A 01/10/2015   Procedure: Coronary Stent Intervention;  Surgeon: Robert Hampshire, MD;  Location: [facility];  Service: Cardiovascular;  Laterality: N/A;   COLONOSCOPY  2005   COLONOSCOPY WITH PROPOFOL N/A 02/14/2017   Procedure: COLONOSCOPY WITH PROPOFOL;  Surgeon: Robert Bellows, MD;  Location: [facility] ENDOSCOPY;  Service: Gastroenterology;  Laterality: N/A;   ESOPHAGOGASTRODUODENOSCOPY (EGD) WITH PROPOFOL N/A 02/14/2017   Procedure: ESOPHAGOGASTRODUODENOSCOPY (EGD) WITH PROPOFOL;  Surgeon: Robert Bellows, MD;  Location: [facility] ENDOSCOPY;  Service: Gastroenterology;  Laterality: N/A;   KNEE SURGERY     bilateral   TONSILLECTOMY AND ADENOIDECTOMY     TOTAL SHOULDER REPLACEMENT Right    Family History  Problem Relation Age of Onset   Stroke Mother    Liver disease Father    Lung cancer Sister    Diabetes Sister    Liver disease Brother    Lung cancer Paternal Grandmother    Social History   Tobacco Use   Smoking status: Never   Smokeless tobacco: Current    Types: Snuff  Vaping Use   Vaping Use: Never used  Substance Use Topics   Alcohol use: Yes    Alcohol/week: 2.0 - 3.0 standard drinks of alcohol    Types: 2 - 3 Cans of beer per week    Comment: ocassionally   Drug use: No    Pertinent Clinical Results:  LABS: Labs reviewed: Acceptable for surgery.  No visits with results within 3 Dobratz(s) from this visit.  Latest known visit with results is:  Hospital  Outpatient Visit on 12/29/2021  Component Date Value Ref Range Status   MRSA, PCR 12/29/2021 NEGATIVE  NEGATIVE Final   Staphylococcus aureus 12/29/2021 NEGATIVE  NEGATIVE Final   Comment: (NOTE) The Xpert SA Assay (FDA approved for NASAL specimens in patients 52 years of age and older), is one component of a comprehensive surveillance program. It is not intended to diagnose infection nor to guide or monitor treatment. Performed at [facility], [address] [place], [place] [address]    ABO/RH(D) 12/29/2021 A POS   Final   Antibody Screen 12/29/2021 NEG   Final   Sample Expiration 12/29/2021 01/12/2022,2359   Final   Extend sample reason 12/29/2021    Final  Value:NO TRANSFUSIONS OR PREGNANCY IN THE PAST 3 MONTHS Performed at [facility], [address] [place], [place] [address]    WBC 12/29/2021 6.8  4.0 - 10.5 K/uL Final   RBC 12/29/2021 4.77  4.22 - 5.81 MIL/uL Final   Hemoglobin 12/29/2021 15.1  13.0 - 17.0 g/dL Final   HCT 12/29/2021 45.5  39.0 - 52.0 % Final   MCV 12/29/2021 95.4  80.0 - 100.0 fL Final   MCH 12/29/2021 31.7  26.0 - 34.0 pg Final   MCHC 12/29/2021 33.2  30.0 - 36.0 g/dL Final   RDW 12/29/2021 11.9  11.5 - 15.5 % Final   Platelets 12/29/2021 275  150 - 400 K/uL Final   nRBC 12/29/2021 0.0  0.0 - 0.2 % Final   Performed at [facility], [place], [place] [address]   Sodium 12/29/2021 140  135 - 145 mmol/L Final   Potassium 12/29/2021 4.1  3.5 - 5.1 mmol/L Final   Chloride 12/29/2021 104  98 - 111 mmol/L Final   CO2 12/29/2021 28  22 - 32 mmol/L Final   Glucose, Bld 12/29/2021 105 (H)  70 - 99 mg/dL Final   Glucose reference range applies only to samples taken after fasting for at least 8 hours.   BUN 12/29/2021 17  8 - 23 mg/dL Final   Creatinine, Ser 12/29/2021 1.19  0.61 - 1.24 mg/dL Final   Calcium 12/29/2021 9.1  8.9 - 10.3 mg/dL Final   Total Protein 12/29/2021 7.5  6.5 - 8.1 g/dL  Final   Albumin 12/29/2021 4.4  3.5 - 5.0 g/dL Final   AST 12/29/2021 31  15 - 41 U/L Final   ALT 12/29/2021 39  0 - 44 U/L Final   Alkaline Phosphatase 12/29/2021 63  38 - 126 U/L Final   Total Bilirubin 12/29/2021 0.7  0.3 - 1.2 mg/dL Final   GFR, Estimated 12/29/2021 >60  >60 mL/min Final   Comment: (NOTE) Calculated using the CKD-EPI Creatinine Equation (2021)    Anion gap 12/29/2021 8  5 - 15 Final   Performed at [facility], [place], [place] [address]   Color, Urine 12/29/2021 YELLOW (A)  YELLOW Final   APPearance 12/29/2021 CLEAR (A)  CLEAR Final   Specific Gravity, Urine 12/29/2021 1.020  1.005 - 1.030 Final   pH 12/29/2021 5.0  5.0 - 8.0 Final   Glucose, UA 12/29/2021 NEGATIVE  NEGATIVE mg/dL Final   Hgb urine dipstick 12/29/2021 SMALL (A)  NEGATIVE Final   Bilirubin Urine 12/29/2021 NEGATIVE  NEGATIVE Final   Ketones, ur 12/29/2021 NEGATIVE  NEGATIVE mg/dL Final   Protein, ur 12/29/2021 NEGATIVE  NEGATIVE mg/dL Final   Nitrite 12/29/2021 NEGATIVE  NEGATIVE Final   Leukocytes,Ua 12/29/2021 NEGATIVE  NEGATIVE Final   RBC / HPF 12/29/2021 0-5  0 - 5 RBC/hpf Final   WBC, UA 12/29/2021 0-5  0 - 5 WBC/hpf Final   Bacteria, UA 12/29/2021 NONE SEEN  NONE SEEN Final   Squamous Epithelial / LPF 12/29/2021 0-5  0 - 5 Final   Performed at [facility], [address] [place], [place] [address]   CRP 12/29/2021 0.8  <1.0 mg/dL Final   Performed at [facility], [place] [address], [place], [place] [address]   Sed Rate 12/29/2021 25 (H)  0 - 20 mm/hr Final   Performed at [facility], [address] [place], [place] [address]    ECG: Date: 12/26/2021 Time ECG  obtained: 1509 PM Rate: 71 bpm Rhythm: normal sinus Axis (leads I and aVF): Normal Intervals: PR 162 ms. QRS 90 ms. QTc 443 ms. ST segment and T wave changes: No evidence of acute ST segment elevation or depression Comparison: Similar to previous tracing obtained on  11/28/2020   IMAGING / PROCEDURES: DIAGNOSTIC RADIOGRAPHS OF RIGHT KNEE 3 VIEWS performed on 11/14/2021 Evidence of significant narrowing of the medial cartilage space with associated varus alignment.   Osteophyte formation is noted.    No evidence of fracture or dislocation.   EXERCISE TOLERANCE TEST performed on 02/02/2019 Resting EKG normal sinus rhythm rate 71 bpm no significant ST-T wave changes  Target heart rate achieved, 141 bpm 90% of maximum predicted heart rate  No significant ST or T wave abnormality at peak stress or in recovery  Normal exercise treadmill study Target heart rate achieved  LEFT HEART CATHETERIZATION AND CORONARY ANGIOGRAPHY performed on 01/10/2015 Significant three-vessel coronary artery disease. Occluded mid right coronary artery is likely the culprit for myocardial infarction. However, there are readily right to right collaterals and left-to-right collaterals. There is a 60% distal left circumflex stenosis and an 85% proximal to mid heavily calcified LAD disease. Low normal LV systolic function with mildly elevated left ventricular end-diastolic pressure. Successful angioplasty and 3 overlapped drug-eluting stent placement to the proximal to mid LAD. Sluggish flow in the jailed first diagonal which improved with nitroglycerin.  Recommendations: It appears that the RCA occlusion is more than 30 days old and already has some faint collaterals. Thus, no PCI was performed. The distal vessel also appears to be diffusely diseased and does not seem to be a good target for intervention. I decided to intervene in the proximal to mid LAD which was heavily calcified. This was a difficult procedure. The distal left circumflex disease can be left to be treated medically. Continue indefinite dual antiplatelet therapy if possible. Aggressive treatment of risk factors.    TRANSTHORACIC ECHOCARDIOGRAM performed on 01/08/2015 Normal left ventricular systolic function with  concentric LVH; EF 60-65% Normal left ventricular diastolic Doppler parameters Normal right ventricular systolic function Mild MR Normal transvalvular gradients; no valvular stenosis No pericardial effusion  Impression and Plan:  Shaft Corigliano Caloca has been referred for pre-anesthesia review and clearance prior to him undergoing the planned anesthetic and procedural courses. Available labs, pertinent testing, and imaging results were personally reviewed by me. This patient has been appropriately cleared by cardiology with an overall ACCEPTABLE risk of significant perioperative cardiovascular complications.  Based on clinical review performed today (01/04/22), barring any significant acute changes in the patient's overall condition, it is anticipated that he will be able to proceed with the planned surgical intervention. Any acute changes in clinical condition may necessitate his procedure being postponed and/or cancelled. Patient will meet with anesthesia team (MD and/or CRNA) on the [place] of his procedure for preoperative evaluation/assessment. Questions regarding anesthetic course will be fielded at that time.   Pre-surgical instructions were reviewed with the patient during his PAT appointment and questions were fielded by PAT clinical staff. Patient was advised that if any questions or concerns arise prior to his procedure then he should return a call to PAT and/or his surgeon's office to discuss.  Honor Loh, MSN, APRN, FNP-C, CEN [facility]  Peri-operative Services Nurse Practitioner Phone: [phone] Fax: [phone] 01/04/22 9:05 AM  NOTE: This note has been prepared using Dragon dictation software. Despite my best ability to proofread, there is always the potential that unintentional  transcriptional errors may still occur from this process.

## 2022-01-04 ENCOUNTER — Encounter: Payer: Self-pay | Admitting: Orthopedic Surgery

## 2022-01-08 DIAGNOSIS — S59902A Unspecified injury of left elbow, initial encounter: Secondary | ICD-10-CM | POA: Diagnosis not present

## 2022-01-08 DIAGNOSIS — S46312A Strain of muscle, fascia and tendon of triceps, left arm, initial encounter: Secondary | ICD-10-CM | POA: Diagnosis not present

## 2022-01-08 DIAGNOSIS — M67824 Other specified disorders of tendon, left elbow: Secondary | ICD-10-CM | POA: Diagnosis not present

## 2022-01-08 DIAGNOSIS — R936 Abnormal findings on diagnostic imaging of limbs: Secondary | ICD-10-CM | POA: Diagnosis not present

## 2022-01-10 ENCOUNTER — Encounter: Admission: RE | Payer: Self-pay | Source: Home / Self Care

## 2022-01-10 ENCOUNTER — Ambulatory Visit: Admission: RE | Admit: 2022-01-10 | Payer: Medicare Other | Source: Home / Self Care | Admitting: Orthopedic Surgery

## 2022-01-10 DIAGNOSIS — S46312A Strain of muscle, fascia and tendon of triceps, left arm, initial encounter: Secondary | ICD-10-CM | POA: Diagnosis not present

## 2022-01-10 DIAGNOSIS — M1711 Unilateral primary osteoarthritis, right knee: Secondary | ICD-10-CM

## 2022-01-10 DIAGNOSIS — I252 Old myocardial infarction: Secondary | ICD-10-CM

## 2022-01-10 DIAGNOSIS — I25118 Atherosclerotic heart disease of native coronary artery with other forms of angina pectoris: Secondary | ICD-10-CM

## 2022-01-10 DIAGNOSIS — E782 Mixed hyperlipidemia: Secondary | ICD-10-CM

## 2022-01-10 HISTORY — DX: Long term (current) use of antithrombotics/antiplatelets: Z79.02

## 2022-01-10 HISTORY — DX: Dorsalgia, unspecified: M54.9

## 2022-01-10 SURGERY — ARTHROPLASTY, KNEE, TOTAL, USING IMAGELESS COMPUTER-ASSISTED NAVIGATION
Anesthesia: Choice | Site: Knee | Laterality: Right

## 2022-01-11 ENCOUNTER — Other Ambulatory Visit: Payer: Self-pay | Admitting: Cardiovascular Disease

## 2022-01-12 DIAGNOSIS — R600 Localized edema: Secondary | ICD-10-CM | POA: Diagnosis not present

## 2022-01-12 DIAGNOSIS — R609 Edema, unspecified: Secondary | ICD-10-CM | POA: Diagnosis not present

## 2022-01-12 DIAGNOSIS — S46312A Strain of muscle, fascia and tendon of triceps, left arm, initial encounter: Secondary | ICD-10-CM | POA: Diagnosis not present

## 2022-01-18 DIAGNOSIS — I10 Essential (primary) hypertension: Secondary | ICD-10-CM | POA: Diagnosis not present

## 2022-01-18 DIAGNOSIS — E782 Mixed hyperlipidemia: Secondary | ICD-10-CM | POA: Diagnosis not present

## 2022-01-18 DIAGNOSIS — S46312A Strain of muscle, fascia and tendon of triceps, left arm, initial encounter: Secondary | ICD-10-CM | POA: Diagnosis not present

## 2022-01-18 DIAGNOSIS — G4733 Obstructive sleep apnea (adult) (pediatric): Secondary | ICD-10-CM | POA: Diagnosis not present

## 2022-01-18 DIAGNOSIS — I2510 Atherosclerotic heart disease of native coronary artery without angina pectoris: Secondary | ICD-10-CM | POA: Diagnosis not present

## 2022-01-19 ENCOUNTER — Other Ambulatory Visit: Payer: Self-pay | Admitting: Cardiovascular Disease

## 2022-01-30 DIAGNOSIS — S46312D Strain of muscle, fascia and tendon of triceps, left arm, subsequent encounter: Secondary | ICD-10-CM | POA: Diagnosis not present

## 2022-01-30 DIAGNOSIS — R29898 Other symptoms and signs involving the musculoskeletal system: Secondary | ICD-10-CM | POA: Diagnosis not present

## 2022-01-30 DIAGNOSIS — M25522 Pain in left elbow: Secondary | ICD-10-CM | POA: Diagnosis not present

## 2022-01-30 DIAGNOSIS — M7032 Other bursitis of elbow, left elbow: Secondary | ICD-10-CM | POA: Diagnosis not present

## 2022-01-30 DIAGNOSIS — Z9861 Coronary angioplasty status: Secondary | ICD-10-CM | POA: Diagnosis not present

## 2022-01-30 DIAGNOSIS — S46312A Strain of muscle, fascia and tendon of triceps, left arm, initial encounter: Secondary | ICD-10-CM | POA: Diagnosis not present

## 2022-01-30 DIAGNOSIS — Z79899 Other long term (current) drug therapy: Secondary | ICD-10-CM | POA: Diagnosis not present

## 2022-01-30 DIAGNOSIS — I10 Essential (primary) hypertension: Secondary | ICD-10-CM | POA: Diagnosis not present

## 2022-01-30 DIAGNOSIS — E785 Hyperlipidemia, unspecified: Secondary | ICD-10-CM | POA: Diagnosis not present

## 2022-01-30 DIAGNOSIS — G8918 Other acute postprocedural pain: Secondary | ICD-10-CM | POA: Diagnosis not present

## 2022-01-30 DIAGNOSIS — Z7902 Long term (current) use of antithrombotics/antiplatelets: Secondary | ICD-10-CM | POA: Diagnosis not present

## 2022-01-30 DIAGNOSIS — E669 Obesity, unspecified: Secondary | ICD-10-CM | POA: Diagnosis not present

## 2022-01-30 DIAGNOSIS — I25119 Atherosclerotic heart disease of native coronary artery with unspecified angina pectoris: Secondary | ICD-10-CM | POA: Diagnosis not present

## 2022-01-30 DIAGNOSIS — Z881 Allergy status to other antibiotic agents status: Secondary | ICD-10-CM | POA: Diagnosis not present

## 2022-01-30 DIAGNOSIS — Z6837 Body mass index (BMI) 37.0-37.9, adult: Secondary | ICD-10-CM | POA: Diagnosis not present

## 2022-01-30 DIAGNOSIS — M79602 Pain in left arm: Secondary | ICD-10-CM | POA: Diagnosis not present

## 2022-02-01 DIAGNOSIS — R590 Localized enlarged lymph nodes: Secondary | ICD-10-CM | POA: Diagnosis not present

## 2022-02-01 DIAGNOSIS — Z1152 Encounter for screening for COVID-19: Secondary | ICD-10-CM | POA: Diagnosis not present

## 2022-02-01 DIAGNOSIS — I2510 Atherosclerotic heart disease of native coronary artery without angina pectoris: Secondary | ICD-10-CM | POA: Diagnosis not present

## 2022-02-01 DIAGNOSIS — Z20822 Contact with and (suspected) exposure to covid-19: Secondary | ICD-10-CM | POA: Diagnosis not present

## 2022-02-01 DIAGNOSIS — Z7902 Long term (current) use of antithrombotics/antiplatelets: Secondary | ICD-10-CM | POA: Diagnosis not present

## 2022-02-01 DIAGNOSIS — R079 Chest pain, unspecified: Secondary | ICD-10-CM | POA: Diagnosis not present

## 2022-02-01 DIAGNOSIS — F17290 Nicotine dependence, other tobacco product, uncomplicated: Secondary | ICD-10-CM | POA: Diagnosis not present

## 2022-02-01 DIAGNOSIS — R0981 Nasal congestion: Secondary | ICD-10-CM | POA: Diagnosis not present

## 2022-02-01 DIAGNOSIS — Z981 Arthrodesis status: Secondary | ICD-10-CM | POA: Diagnosis not present

## 2022-02-01 DIAGNOSIS — I771 Stricture of artery: Secondary | ICD-10-CM | POA: Diagnosis not present

## 2022-02-01 DIAGNOSIS — J101 Influenza due to other identified influenza virus with other respiratory manifestations: Secondary | ICD-10-CM | POA: Diagnosis not present

## 2022-02-01 DIAGNOSIS — Z79899 Other long term (current) drug therapy: Secondary | ICD-10-CM | POA: Diagnosis not present

## 2022-02-01 DIAGNOSIS — E785 Hyperlipidemia, unspecified: Secondary | ICD-10-CM | POA: Diagnosis not present

## 2022-02-01 DIAGNOSIS — Z96652 Presence of left artificial knee joint: Secondary | ICD-10-CM | POA: Diagnosis not present

## 2022-02-01 DIAGNOSIS — Z955 Presence of coronary angioplasty implant and graft: Secondary | ICD-10-CM | POA: Diagnosis not present

## 2022-02-01 DIAGNOSIS — I10 Essential (primary) hypertension: Secondary | ICD-10-CM | POA: Diagnosis not present

## 2022-02-01 DIAGNOSIS — Z888 Allergy status to other drugs, medicaments and biological substances status: Secondary | ICD-10-CM | POA: Diagnosis not present

## 2022-02-01 DIAGNOSIS — F419 Anxiety disorder, unspecified: Secondary | ICD-10-CM | POA: Diagnosis not present

## 2022-02-01 DIAGNOSIS — I252 Old myocardial infarction: Secondary | ICD-10-CM | POA: Diagnosis not present

## 2022-02-01 DIAGNOSIS — Z96611 Presence of right artificial shoulder joint: Secondary | ICD-10-CM | POA: Diagnosis not present

## 2022-02-01 DIAGNOSIS — Z7982 Long term (current) use of aspirin: Secondary | ICD-10-CM | POA: Diagnosis not present

## 2022-02-01 DIAGNOSIS — J9811 Atelectasis: Secondary | ICD-10-CM | POA: Diagnosis not present

## 2022-02-01 DIAGNOSIS — F32A Depression, unspecified: Secondary | ICD-10-CM | POA: Diagnosis not present

## 2022-02-01 DIAGNOSIS — J40 Bronchitis, not specified as acute or chronic: Secondary | ICD-10-CM | POA: Diagnosis not present

## 2022-02-01 DIAGNOSIS — M1991 Primary osteoarthritis, unspecified site: Secondary | ICD-10-CM | POA: Diagnosis not present

## 2022-02-01 DIAGNOSIS — R059 Cough, unspecified: Secondary | ICD-10-CM | POA: Diagnosis not present

## 2022-02-01 DIAGNOSIS — Z881 Allergy status to other antibiotic agents status: Secondary | ICD-10-CM | POA: Diagnosis not present

## 2022-02-01 DIAGNOSIS — G4733 Obstructive sleep apnea (adult) (pediatric): Secondary | ICD-10-CM | POA: Diagnosis not present

## 2022-02-05 ENCOUNTER — Other Ambulatory Visit: Payer: Self-pay | Admitting: Neurosurgery

## 2022-02-05 NOTE — Telephone Encounter (Signed)
Last seen for phone visit by Danielle on 01/13/21.   Refill request for zanaflex was received and denied.   He should get further refills of zanaflex from his PCP or make a f/u appointment with [place] for evaluation. Please let him know.

## 2022-02-06 ENCOUNTER — Ambulatory Visit (INDEPENDENT_AMBULATORY_CARE_PROVIDER_SITE_OTHER): Payer: Medicare Other | Admitting: Internal Medicine

## 2022-02-06 ENCOUNTER — Encounter: Payer: Self-pay | Admitting: Internal Medicine

## 2022-02-06 VITALS — BP 126/82 | HR 69 | Temp 96.6°F | Wt 276.0 lb

## 2022-02-06 DIAGNOSIS — J00 Acute nasopharyngitis [common cold]: Secondary | ICD-10-CM

## 2022-02-06 DIAGNOSIS — J40 Bronchitis, not specified as acute or chronic: Secondary | ICD-10-CM

## 2022-02-06 DIAGNOSIS — J101 Influenza due to other identified influenza virus with other respiratory manifestations: Secondary | ICD-10-CM

## 2022-02-06 MED ORDER — AZITHROMYCIN 250 MG PO TABS: ORAL_TABLET | ORAL | 0 refills | Status: DC

## 2022-02-06 NOTE — Progress Notes (Signed)
Subjective:    Patient ID: Robert Greer, male    DOB: Mar 18, 1955, 67 y.o.   MRN: [id number]  HPI  Patient presents to clinic today for ER follow-up.  He presented to the ER 1/11 with complaint of respiratory symptoms.  Chest x-ray was normal.  ECG was normal.  Labs were unremarkable.  Respiratory panel was positive for flu.  He was treated with Tamiflu.  He was diagnosed with bronchitis in addition to influenza.  He was discharged and advised to follow-up with his PCP.  Since that time, he reports postnasal drip and cough.  The cough is productive of deep yellow mucus.  He denies headache, runny nose, nasal congestion, ear pain, sore throat, shortness of breath, nausea, vomiting or diarrhea.  He has tried Zyrtec, Mucinex OTC with minimal relief of symptoms.  Review of Systems  Past Medical History:  Diagnosis Date   Anxiety    Arthritis    Back pain    CAD (coronary artery disease)    a.) NSTEMI 01/07/2015; b.) LHC/PCI 01/10/2015: LM nl, LAD 85p/m (overlapping 2.75 x 22 mm (pLAD), 2.5 x 22 mm (mLAD), and 2.75 x 12 mm (mLAD) Resolute Integrity DES), D1 50ost, D2/3 small/nl, LCX 60d, OM1 nl, OM2 min irregs, OM3 nl, RCA 182m(likely culprit w/ L->R, R->R collats-->Med Rx). Nl EF.   Complication of anesthesia 2010   a.) [place] "never told [place] why"   Depressive disorder, not elsewhere classified    GERD (gastroesophageal reflux disease)    Gout    History of echocardiogram    a. 12/2014 Echo: EF 60-65%, no rwma, mild MR. Nl RV fxn.   Hyperlipidemia    Hypertension    Lipoma of back 02/09/2015   Long term current use of antithrombotics/antiplatelets    a.) on DAPT (ASA + clopidogrel)   Measles    Mobitz (type) II atrioventricular block    Mumps    NSTEMI (non-ST elevated [place] infarction) [place] 01/[place]   a.) troponins were trended: 0.63 --> 3.16 --> 10.08 --> 9.73; b.) LHC/PCI 01/10/2015 --> overlapping 2.75 x 22 mm (pLAD), 2.5 x 22 mm  (mLAD), and 2.75 x 12 mm (mLAD) Resolute Integrity DES   Obesity    OSA treated with BiPAP    Other hemochromatosis 12/05/2016    Current Outpatient Medications  Medication Sig Dispense Refill   aspirin 81 MG chewable tablet Chew 1 tablet (81 mg total) by mouth daily. 30 tablet 3   atorvastatin (LIPITOR) 20 MG tablet Take 1 tablet (20 mg total) by mouth daily. (Patient taking differently: Take 20 mg by mouth every morning.) 90 tablet 3   cetirizine (ZYRTEC) 10 MG tablet Take 1 tablet (10 mg total) by mouth as needed for allergies. Reported on 02/09/2015 90 tablet 1   clopidogrel (PLAVIX) 75 MG tablet Take 1 tablet (75 mg total) by mouth daily. 90 tablet 0   colchicine 0.6 MG tablet Take 2 tabs PO at the first sign of a gout attack followed by 1 tab PO 1 hour later. 30 tablet 0   diclofenac Sodium (VOLTAREN) 1 % GEL Apply 2 g topically 4 (four) times daily. 100 g 1   DULoxetine (CYMBALTA) 60 MG capsule Take 1 capsule (60 mg total) by mouth 2 (two) times daily. (Patient taking differently: Take 60 mg by mouth every morning.) 180 capsule 0   ezetimibe (ZETIA) 10 MG tablet Take 1 tablet (10 mg total) by mouth daily.  90 tablet 0   oxyCODONE-acetaminophen (PERCOCET) 10-325 MG tablet Take 1 tablet by mouth 2 (two) times daily.     pregabalin (LYRICA) 75 MG capsule Take 75 mg by mouth 2 (two) times daily. 1 tab in the morning and 2 at bedtime     triamcinolone (NASACORT) 55 MCG/ACT AERO nasal inhaler Place 2 sprays into the nose daily. (Patient taking differently: Place 2 sprays into the nose as needed.) 1 Inhaler 12   valsartan-hydrochlorothiazide (DIOVAN-HCT) 160-12.5 MG tablet Take 1 tablet by mouth daily. (Patient taking differently: Take 1 tablet by mouth every morning.) 90 tablet 0   No current facility-administered medications for this visit.    Allergies  Allergen Reactions   Buspirone Other (See Comments)    Disliked the way it made him feel  Other reaction(s): Unknown  Did not like  the way it made him feel   Levaquin [Levofloxacin] Rash    Family History  Problem Relation Age of Onset   Stroke Mother    Liver disease Father    Lung cancer Sister    Diabetes Sister    Liver disease Brother    Lung cancer Paternal Grandmother     Social History   Socioeconomic History   Marital status: Married    Spouse name: Not on file   Number of children: Not on file   Years of education: Not on file   Highest education level: Not on file  Occupational History   Not on file  Tobacco Use   Smoking status: Never   Smokeless tobacco: Current    Types: Snuff  Vaping Use   Vaping Use: Never used  Substance and Sexual Activity   Alcohol use: Yes    Alcohol/week: 2.0 - 3.0 standard drinks of alcohol    Types: 2 - 3 Cans of beer per week    Comment: ocassionally   Drug use: No   Sexual activity: Yes  Other Topics Concern   Not on file  Social History Narrative   Not on file   Social Determinants of Health   Financial Resource Strain: Low Risk  (04/12/2020)   Overall Financial Resource Strain (CARDIA)    Difficulty of Paying Living Expenses: Not hard at all  Food Insecurity: No Food Insecurity (04/12/2020)   Hunger Vital Sign    Worried About Running Out of Food in the Last Year: Never true    Ran Out of Food in the Last Year: Never true  Transportation Needs: No Transportation Needs (04/12/2020)   PRAPARE - Hydrologist (Medical): No    Lack of Transportation (Non-Medical): No  Physical Activity: Inactive (04/12/2020)   Exercise Vital Sign    Days of Exercise per Week: 0 days    Minutes of Exercise per Session: 0 min  Stress: No Stress Concern Present (04/12/2020)   [place]    Feeling of Stress : Not at all  Social Connections: Not on file  Intimate Partner Violence: Not on file     Constitutional: Denies fever, malaise, fatigue, headache or abrupt weight  changes.  HEENT: Patient reports postnasal drip.  Denies eye pain, eye redness, ear pain, ringing in the ears, wax buildup, runny nose, nasal congestion, bloody nose, or sore throat. Respiratory: Patient reports cough.  Denies difficulty breathing, shortness of breath.   Cardiovascular: Denies chest pain, chest tightness, palpitations or swelling in the hands or feet.  Gastrointestinal: Denies abdominal pain, bloating, constipation,  diarrhea or blood in the stool.    No other specific complaints in a complete review of systems (except as listed in HPI above).     Objective:   Physical Exam   BP 126/82 (BP Location: Right Arm, Patient Position: Sitting, Cuff Size: Large)   Pulse 69   Temp (!) 96.6 F (35.9 C) (Temporal)   Wt 276 lb (125.2 kg)   SpO2 100%   BMI 38.49 kg/m   Wt Readings from Last 3 Encounters:  12/29/21 278 lb 7.1 oz (126.3 kg)  12/26/21 281 lb (127.5 kg)  10/26/21 278 lb (126.1 kg)    General: Appears his stated age, obese, in NAD. Skin: Warm, dry and intact. No rashes noted. HEENT: Head: normal shape and size, no sinus tenderness noted; Eyes: sclera white, no icterus, conjunctiva pink, PERRLA and EOMs intact; Throat/Mouth: Teeth present, mucosa pink and moist, + PND, no exudate, lesions or ulcerations noted.  Neck: No adenopathy noted. Cardiovascular: Normal rate and rhythm. S1,S2 noted.  No murmur, rubs or gallops noted. No JVD or BLE edema. No carotid bruits noted. Pulmonary/Chest: Normal effort and positive vesicular breath sounds. No respiratory distress. No wheezes, rales or ronchi noted.  Neurological: Alert and oriented.   BMET    Component Value Date/Time   NA 140 12/29/2021 1359   NA 141 02/02/2019 0834   K 4.1 12/29/2021 1359   CL 104 12/29/2021 1359   CO2 28 12/29/2021 1359   GLUCOSE 105 (H) 12/29/2021 1359   BUN 17 12/29/2021 1359   BUN 18 02/02/2019 0834   CREATININE 1.19 12/29/2021 1359   CREATININE 1.42 (H) 10/26/2021 1427   CALCIUM 9.1  12/29/2021 1359   GFRNONAA >60 12/29/2021 1359   GFRNONAA 62 01/01/2020 1549   GFRAA 72 01/01/2020 1549    Lipid Panel     Component Value Date/Time   CHOL 139 10/26/2021 1427   CHOL 149 02/02/2019 0834   TRIG 170 (H) 10/26/2021 1427   HDL 58 10/26/2021 1427   HDL 67 02/02/2019 0834   CHOLHDL 2.4 10/26/2021 1427   VLDL 23 01/08/2015 0225   LDLCALC 56 10/26/2021 1427    CBC    Component Value Date/Time   WBC 6.8 12/29/2021 1359   RBC 4.77 12/29/2021 1359   HGB 15.1 12/29/2021 1359   HCT 45.5 12/29/2021 1359   PLT 275 12/29/2021 1359   MCV 95.4 12/29/2021 1359   MCH 31.7 12/29/2021 1359   MCHC 33.2 12/29/2021 1359   RDW 11.9 12/29/2021 1359   LYMPHSABS 1,554 01/01/2020 1549   MONOABS 0.6 08/27/2018 1501   EOSABS 333 01/01/2020 1549   BASOSABS 37 01/01/2020 1549    Hgb A1C Lab Results  Component Value Date   HGBA1C 5.9 (H) 10/26/2021           Assessment & Plan:   ER follow-up for Influenza, Bronchitis:  ER notes, labs and imaging reviewed Encourage rest and fluids Rx for azithromycin 250 mg x 5 days for presumed bacterial infection secondary to viral infection Continue antihistamine OTC  RTC in 3 months for follow-up of chronic conditions Webb Silversmith, NP

## 2022-02-06 NOTE — Patient Instructions (Signed)

## 2022-02-15 ENCOUNTER — Encounter: Payer: Self-pay | Admitting: Internal Medicine

## 2022-02-15 ENCOUNTER — Ambulatory Visit (INDEPENDENT_AMBULATORY_CARE_PROVIDER_SITE_OTHER): Payer: Medicare Other | Admitting: Internal Medicine

## 2022-02-15 VITALS — BP 118/80 | HR 68 | Temp 96.4°F | Wt 271.0 lb

## 2022-02-15 DIAGNOSIS — R051 Acute cough: Secondary | ICD-10-CM | POA: Diagnosis not present

## 2022-02-15 DIAGNOSIS — R509 Fever, unspecified: Secondary | ICD-10-CM | POA: Diagnosis not present

## 2022-02-15 DIAGNOSIS — F419 Anxiety disorder, unspecified: Secondary | ICD-10-CM | POA: Diagnosis not present

## 2022-02-15 DIAGNOSIS — J3489 Other specified disorders of nose and nasal sinuses: Secondary | ICD-10-CM | POA: Diagnosis not present

## 2022-02-15 DIAGNOSIS — F32A Depression, unspecified: Secondary | ICD-10-CM

## 2022-02-15 DIAGNOSIS — S46312D Strain of muscle, fascia and tendon of triceps, left arm, subsequent encounter: Secondary | ICD-10-CM

## 2022-02-15 MED ORDER — BUPROPION HCL ER (XL) 150 MG PO TB24: 150.0000 mg | ORAL_TABLET | Freq: Every day | ORAL | 1 refills | Status: DC

## 2022-02-15 NOTE — Assessment & Plan Note (Signed)
Deteriorated We will add bupropion to duloxetine Support offered

## 2022-02-15 NOTE — Progress Notes (Deleted)
Commercial Driver Medical Examination   Robert Greer is a 67 y.o. male who presents today for a commercial driver fitness determination physical exam. The patient reports {problems:16946::"no problems"}. {Common ambulatory SmartLinks:19316} Review of Systems  Past Medical History:  Diagnosis Date   Anxiety    Arthritis    Back pain    CAD (coronary artery disease)    a.) NSTEMI 01/07/2015; b.) LHC/PCI 01/10/2015: LM nl, LAD 85p/m (overlapping 2.75 x 22 mm (pLAD), 2.5 x 22 mm (mLAD), and 2.75 x 12 mm (mLAD) Resolute Integrity DES), D1 50ost, D2/3 small/nl, LCX 60d, OM1 nl, OM2 min irregs, OM3 nl, RCA 19m(likely culprit w/ L->R, R->R collats-->Med Rx). Nl EF.   Complication of anesthesia 2010   a.) [place]"   Depressive disorder, not elsewhere classified    GERD (gastroesophageal reflux disease)    Gout    History of echocardiogram    a. 12/2014 Echo: EF 60-65%, no rwma, mild MR. Nl RV fxn.   Hyperlipidemia    Hypertension    Lipoma of back 02/09/2015   Long term current use of antithrombotics/antiplatelets    a.) on DAPT (ASA + clopidogrel)   Measles    Mobitz (type) II atrioventricular block    Mumps    [place]   a.) troponins were trended: 0.63 --> 3.16 --> 10.08 --> 9.73; b.) LHC/PCI 01/10/2015 --> overlapping 2.75 x 22 mm (pLAD), 2.5 x 22 mm (mLAD), and 2.75 x 12 mm (mLAD) Resolute Integrity DES   Obesity    OSA treated with BiPAP    Other hemochromatosis 12/05/2016    Current Outpatient Medications  Medication Sig Dispense Refill   aspirin 81 MG chewable tablet Chew 1 tablet (81 mg total) by mouth daily. 30 tablet 3   atorvastatin (LIPITOR) 20 MG tablet Take 1 tablet (20 mg total) by mouth daily. (Patient taking differently: Take 20 mg by mouth every morning.) 90 tablet 3   azithromycin (ZITHROMAX) 250 MG tablet Take 2 tabs today, then 1 tab daily x 4  days 6 tablet 0   cetirizine (ZYRTEC) 10 MG tablet Take 1 tablet (10 mg total) by mouth as needed for allergies. Reported on 02/09/2015 90 tablet 1   clopidogrel (PLAVIX) 75 MG tablet Take 1 tablet (75 mg total) by mouth daily. 90 tablet 0   colchicine 0.6 MG tablet Take 2 tabs PO at the first sign of a gout attack followed by 1 tab PO 1 hour later. 30 tablet 0   diclofenac Sodium (VOLTAREN) 1 % GEL Apply 2 g topically 4 (four) times daily. 100 g 1   DULoxetine (CYMBALTA) 60 MG capsule Take 1 capsule (60 mg total) by mouth 2 (two) times daily. (Patient taking differently: Take 60 mg by mouth every morning.) 180 capsule 0   ezetimibe (ZETIA) 10 MG tablet Take 1 tablet (10 mg total) by mouth daily. 90 tablet 0   oxyCODONE-acetaminophen (PERCOCET) 10-325 MG tablet Take 1 tablet by mouth 2 (two) times daily.     pregabalin (LYRICA) 75 MG capsule Take 75 mg by mouth 2 (two) times daily. 1 tab in the morning and 2 at bedtime     triamcinolone (NASACORT) 55 MCG/ACT AERO nasal inhaler Place 2 sprays into the nose daily. (Patient taking differently: Place 2 sprays into the nose as needed.) 1 Inhaler 12   valsartan-hydrochlorothiazide (DIOVAN-HCT) 160-12.5 MG tablet Take 1 tablet by  mouth daily. (Patient taking differently: Take 1 tablet by mouth every morning.) 90 tablet 0   No current facility-administered medications for this visit.    Allergies  Allergen Reactions   Buspirone Other (See Comments)    Disliked the way it made him feel  Other reaction(s): Unknown  Did not like the way it made him feel   Levaquin [Levofloxacin] Rash    Family History  Problem Relation Age of Onset   Stroke Mother    Liver disease Father    Lung cancer Sister    Diabetes Sister    Liver disease Brother    Lung cancer Paternal Grandmother     Social History   Socioeconomic History   Marital status: Married    Spouse name: Not on file   Number of children: Not on file   Years of education: Not on file    Highest education level: Not on file  Occupational History   Not on file  Tobacco Use   Smoking status: Never   Smokeless tobacco: Current    Types: Snuff  Vaping Use   Vaping Use: Never used  Substance and Sexual Activity   Alcohol use: Yes    Alcohol/week: 2.0 - 3.0 standard drinks of alcohol    Types: 2 - 3 Cans of beer per week    Comment: ocassionally   Drug use: No   Sexual activity: Yes  Other Topics Concern   Not on file  Social History Narrative   Not on file   Social Determinants of Health   Financial Resource Strain: Low Risk  (04/12/2020)   Overall Financial Resource Strain (CARDIA)    Difficulty of Paying Living Expenses: Not hard at all  Food Insecurity: No Food Insecurity (04/12/2020)   Hunger Vital Sign    Worried About Running Out of Food in the Last Year: Never true    Ran Out of Food in the Last Year: Never true  Transportation Needs: No Transportation Needs (04/12/2020)   PRAPARE - Hydrologist (Medical): No    Lack of Transportation (Non-Medical): No  Physical Activity: Inactive (04/12/2020)   Exercise Vital Sign    Days of Exercise per Week: 0 days    Minutes of Exercise per Session: 0 min  Stress: No Stress Concern Present (04/12/2020)   [place]    Feeling of Stress : Not at all  Social Connections: Not on file  Intimate Partner Violence: Not on file     Constitutional: Denies fever, malaise, fatigue, headache or abrupt weight changes.  HEENT: Denies eye pain, eye redness, ear pain, ringing in the ears, wax buildup, runny nose, nasal congestion, bloody nose, or sore throat. Respiratory: Denies difficulty breathing, shortness of breath, cough or sputum production.   Cardiovascular: Denies chest pain, chest tightness, palpitations or swelling in the hands or feet.  Gastrointestinal: Denies abdominal pain, bloating, constipation, diarrhea or blood in the  stool.  GU: Denies urgency, frequency, pain with urination, burning sensation, blood in urine, odor or discharge. Musculoskeletal: Patient reports joint pain.  Denies decrease in range of motion, difficulty with gait, muscle pain or joint swelling.  Skin: Denies redness, rashes, lesions or ulcercations.  Neurological: Denies dizziness, difficulty with memory, difficulty with speech or problems with balance and coordination.  Psych: Patient has a history of anxiety and depression.  Denies SI/HI.  No other specific complaints in a complete review of systems (except as listed in  HPI above).   Objective:    Vision:  Uncorrected Corrected Horizontal Field of Vision  Right Eye {result:19455::"20/20"} {result:19455::"20/20"} 70 degrees  Left Eye  {result:19455::"20/20"} {result:19455::"20/20"} 70 degrees  Both Eyes  {result:19455::"20/20"} {YKDXIP:38250::"53/97"}    Applicant can recognize and distinguish among traffic control signals and devices showing standard red, green, and amber colors.  {Corrective lens required?:19458::" "}  Monocular Vision?: No   Hearing:        Right Ear  42f     Left Ear  523f     {Hearing aid requirement:19457::" "} There were no vitals taken for this visit. Wt Readings from Last 3 Encounters:  02/06/22 276 lb (125.2 kg)  12/29/21 278 lb 7.1 oz (126.3 kg)  12/26/21 281 lb (127.5 kg)    General: Appears their stated age, well developed, well nourished in NAD. Skin: Warm, dry and intact. No rashes, lesions or ulcerations noted. HEENT: Head: normal shape and size; Eyes: sclera white, no icterus, conjunctiva pink, PERRLA and EOMs intact; Ears: Tm's gray and intact, normal light reflex; Nose: mucosa pink and moist, septum midline; Throat/Mouth: Teeth present, mucosa pink and moist, no exudate, lesions or ulcerations noted.  Neck:  Neck supple, trachea midline. No masses, lumps or thyromegaly present.  Cardiovascular: Normal rate and rhythm. S1,S2 noted.   No murmur, rubs or gallops noted. No JVD or BLE edema. No carotid bruits noted. Pulmonary/Chest: Normal effort and positive vesicular breath sounds. No respiratory distress. No wheezes, rales or ronchi noted.  Abdomen: Soft and nontender. Normal bowel sounds. No distention or masses noted. Liver, spleen and kidneys non palpable. Musculoskeletal: Normal range of motion. No signs of joint swelling. No difficulty with gait.  Neurological: Alert and oriented. Cranial nerves II-XII grossly intact. Coordination normal.  Psychiatric: Mood and affect normal. Behavior is normal. Judgment and thought content normal.     BMET    Component Value Date/Time   NA 140 12/29/2021 1359   NA 141 02/02/2019 0834   K 4.1 12/29/2021 1359   CL 104 12/29/2021 1359   CO2 28 12/29/2021 1359   GLUCOSE 105 (H) 12/29/2021 1359   BUN 17 12/29/2021 1359   BUN 18 02/02/2019 0834   CREATININE 1.19 12/29/2021 1359   CREATININE 1.42 (H) 10/26/2021 1427   CALCIUM 9.1 12/29/2021 1359   GFRNONAA >60 12/29/2021 1359   GFRNONAA 62 01/01/2020 1549   GFRAA 72 01/01/2020 1549    Lipid Panel     Component Value Date/Time   CHOL 139 10/26/2021 1427   CHOL 149 02/02/2019 0834   TRIG 170 (H) 10/26/2021 1427   HDL 58 10/26/2021 1427   HDL 67 02/02/2019 0834   CHOLHDL 2.4 10/26/2021 1427   VLDL 23 01/08/2015 0225   LDLCALC 56 10/26/2021 1427    CBC    Component Value Date/Time   WBC 6.8 12/29/2021 1359   RBC 4.77 12/29/2021 1359   HGB 15.1 12/29/2021 1359   HCT 45.5 12/29/2021 1359   PLT 275 12/29/2021 1359   MCV 95.4 12/29/2021 1359   MCH 31.7 12/29/2021 1359   MCHC 33.2 12/29/2021 1359   RDW 11.9 12/29/2021 1359   LYMPHSABS 1,554 01/01/2020 1549   MONOABS 0.6 08/27/2018 1501   EOSABS 333 01/01/2020 1549   BASOSABS 37 01/01/2020 1549    Hgb A1C Lab Results  Component Value Date   HGBA1C 5.9 (H) 10/26/2021        Labs:  Specific gravity: Glucose: Protein: Blood:  Assessment:    Healthy  male exam.  {Determination:19453}    Plan:    {ACQP:84835::"YVDPBAQ examiners certificate completed and printed.","Return as needed."}

## 2022-02-15 NOTE — Progress Notes (Signed)
Subjective:    Patient ID: Robert Greer, male    DOB: 1955-07-29, 67 y.o.   MRN: [id number]  HPI  Patient presents to clinic today with complaint of overall not feeling well.  He reports fever of 100.2 last night.  He reports some persistent runny nose, nasal congestion and cough.  He is blowing clear mucus out of his nose.  The cough is mostly nonproductive.  He denies headache, ear pain, sore throat, shortness of breath, nausea, vomiting or diarrhea.  He denies chills or bodyaches.  He was seen 1/16 for similar complaints.  He was diagnosed with influenza 1/11 and treated with Tamiflu.  He subsequently received Azithromycin for prolonged symptoms.  He also recently had a triceps tendon repair, due to have his staples removed next week.  He reports redness and warmth around the incision site and is concerned that this may be infected.  He has not noticed any pus draining from the area.  He has also been feeling depressed lately.  He attributes this to the fact that he has not been feeling well and with his recent surgery he is unable to do things that he typically likes to do.  He reports he is not used to being sedentary.  He is currently taking Duloxetine as prescribed but feels like he needs a medication adjustment.  He denies anxiety, SI/HI.  Review of Systems     Past Medical History:  Diagnosis Date   Anxiety    Arthritis    Back pain    CAD (coronary artery disease)    a.) NSTEMI 01/07/2015; b.) LHC/PCI 01/10/2015: LM nl, LAD 85p/m (overlapping 2.75 x 22 mm (pLAD), 2.5 x 22 mm (mLAD), and 2.75 x 12 mm (mLAD) Resolute Integrity DES), D1 50ost, D2/3 small/nl, LCX 60d, OM1 nl, OM2 min irregs, OM3 nl, RCA 131m(likely culprit w/ L->R, R->R collats-->Med Rx). Nl EF.   Complication of anesthesia 2010   a.) cardiac arrest in OR during back surgery done in [facility] "never told me why"   Depressive disorder, not elsewhere classified    GERD (gastroesophageal reflux disease)    Gout    History of  echocardiogram    a. 12/2014 Echo: EF 60-65%, no rwma, mild MR. Nl RV fxn.   Hyperlipidemia    Hypertension    Lipoma of back 02/09/2015   Long term current use of antithrombotics/antiplatelets    a.) on DAPT (ASA + clopidogrel)   Measles    Mobitz (type) II atrioventricular block    Mumps    NSTEMI (non-ST elevated myocardial infarction) ([facility] 01/07/2015   a.) troponins were trended: 0.63 --> 3.16 --> 10.08 --> 9.73; b.) LHC/PCI 01/10/2015 --> overlapping 2.75 x 22 mm (pLAD), 2.5 x 22 mm (mLAD), and 2.75 x 12 mm (mLAD) Resolute Integrity DES   Obesity    OSA treated with BiPAP    Other hemochromatosis 12/05/2016    Current Outpatient Medications  Medication Sig Dispense Refill   aspirin 81 MG chewable tablet Chew 1 tablet (81 mg total) by mouth daily. 30 tablet 3   atorvastatin (LIPITOR) 20 MG tablet Take 1 tablet (20 mg total) by mouth daily. (Patient taking differently: Take 20 mg by mouth every morning.) 90 tablet 3   cetirizine (ZYRTEC) 10 MG tablet Take 1 tablet (10 mg total) by mouth as needed for allergies. Reported on 02/09/2015 90 tablet 1   clopidogrel (PLAVIX) 75 MG tablet Take 1 tablet (75 mg total) by mouth daily. 90 tablet  0   colchicine 0.6 MG tablet Take 2 tabs PO at the first sign of a gout attack followed by 1 tab PO 1 hour later. 30 tablet 0   diclofenac Sodium (VOLTAREN) 1 % GEL Apply 2 g topically 4 (four) times daily. 100 g 1   DULoxetine (CYMBALTA) 60 MG capsule Take 1 capsule (60 mg total) by mouth 2 (two) times daily. (Patient taking differently: Take 60 mg by mouth every morning.) 180 capsule 0   ezetimibe (ZETIA) 10 MG tablet Take 1 tablet (10 mg total) by mouth daily. 90 tablet 0   oxyCODONE-acetaminophen (PERCOCET) 10-325 MG tablet Take 1 tablet by mouth 2 (two) times daily.     pregabalin (LYRICA) 75 MG capsule Take 75 mg by mouth 2 (two) times daily. 1 tab in the morning and 2 at bedtime     triamcinolone (NASACORT) 55 MCG/ACT AERO nasal inhaler Place 2  sprays into the nose daily. (Patient taking differently: Place 2 sprays into the nose as needed.) 1 Inhaler 12   valsartan-hydrochlorothiazide (DIOVAN-HCT) 160-12.5 MG tablet Take 1 tablet by mouth daily. (Patient taking differently: Take 1 tablet by mouth every morning.) 90 tablet 0   azithromycin (ZITHROMAX) 250 MG tablet Take 2 tabs today, then 1 tab daily x 4 days 6 tablet 0   No current facility-administered medications for this visit.    Allergies  Allergen Reactions   Buspirone Other (See Comments)    Disliked the way it made him feel  Other reaction(s): Unknown  Did not like the way it made him feel   Levaquin [Levofloxacin] Rash    Family History  Problem Relation Age of Onset   Stroke Mother    Liver disease Father    Lung cancer Sister    Diabetes Sister    Liver disease Brother    Lung cancer Paternal Grandmother     Social History   Socioeconomic History   Marital status: Married    Spouse name: Not on file   Number of children: Not on file   Years of education: Not on file   Highest education level: Not on file  Occupational History   Not on file  Tobacco Use   Smoking status: Never   Smokeless tobacco: Current    Types: Snuff  Vaping Use   Vaping Use: Never used  Substance and Sexual Activity   Alcohol use: Yes    Alcohol/week: 2.0 - 3.0 standard drinks of alcohol    Types: 2 - 3 Cans of beer per week    Comment: ocassionally   Drug use: No   Sexual activity: Yes  Other Topics Concern   Not on file  Social History Narrative   Not on file   Social Determinants of Health   Financial Resource Strain: Low Risk  (04/12/2020)   Overall Financial Resource Strain (CARDIA)    Difficulty of Paying Living Expenses: Not hard at all  Food Insecurity: No Food Insecurity (04/12/2020)   Hunger Vital Sign    Worried About Running Out of Food in the Last Year: Never true    Ran Out of Food in the Last Year: Never true  Transportation Needs: No  Transportation Needs (04/12/2020)   PRAPARE - Hydrologist (Medical): No    Lack of Transportation (Non-Medical): No  Physical Activity: Inactive (04/12/2020)   Exercise Vital Sign    Days of Exercise per Week: 0 days    Minutes of Exercise per Session: 0 min  Stress: No Stress Concern Present (04/12/2020)   [place]    Feeling of Stress : Not at all  Social Connections: Not on file  Intimate Partner Violence: Not on file     Constitutional: Patient reports fever malaise.  Denies fatigue, headache or abrupt weight changes.  HEENT: Patient reports runny nose, nasal congestion.  Denies eye pain, eye redness, ear pain, ringing in the ears, wax buildup, bloody nose, or sore throat. Respiratory: Patient reports cough.  Denies difficulty breathing, shortness of breath, or sputum production.   Cardiovascular: Denies chest pain, chest tightness, palpitations or swelling in the hands or feet.  Gastrointestinal: Denies abdominal pain, bloating, constipation, diarrhea or blood in the stool.  GU: Denies urgency, frequency, pain with urination, burning sensation, blood in urine, odor or discharge. Musculoskeletal: Denies decrease in range of motion, difficulty with gait, muscle pain or joint pain and swelling.  Skin: Patient reports redness and warmth around incision site.  Denies rashes, lesions or ulcercations.  Neurological: Denies dizziness, difficulty with memory, difficulty with speech or problems with balance and coordination.  Psych: Patient reports depression.  Denies anxiety, SI/HI.  No other specific complaints in a complete review of systems (except as listed in HPI above).  Objective:   Physical Exam   BP 118/80 (BP Location: Right Arm, Patient Position: Sitting, Cuff Size: Large)   Pulse 68   Temp (!) 96.4 F (35.8 C) (Temporal)   Wt 271 lb (122.9 kg)   SpO2 100%   BMI 37.80 kg/m  Wt  Readings from Last 3 Encounters:  02/15/22 271 lb (122.9 kg)  02/06/22 276 lb (125.2 kg)  12/29/21 278 lb 7.1 oz (126.3 kg)    General: Appears his stated age, obese, in NAD. Skin: Warm, dry and intact.  Slight redness and warmth around the incision of the left elbow however this appears normal status post surgical intervention and there is no concern for flexion. HEENT: Head: normal shape and size, no sinus tenderness noted; Eyes: sclera white, no icterus, conjunctiva pink, PERRLA and EOMs intact; Neck: No adenopathy noted. Cardiovascular: Normal rate and rhythm. S1,S2 noted.  No murmur, rubs or gallops noted.  Pulmonary/Chest: Normal effort and positive vesicular breath sounds. No respiratory distress. No wheezes, rales or ronchi noted.  Musculoskeletal: Decreased flexion extension of the left elbow secondary to recent surgery.  Slight swelling noted of the left upper extremity which would be expected status post surgery.  No difficulty with gait.  Neurological: Alert and oriented.  Psychiatric: Mood and affect flat. Behavior is normal. Judgment and thought content normal.    BMET    Component Value Date/Time   NA 140 12/29/2021 1359   NA 141 02/02/2019 0834   K 4.1 12/29/2021 1359   CL 104 12/29/2021 1359   CO2 28 12/29/2021 1359   GLUCOSE 105 (H) 12/29/2021 1359   BUN 17 12/29/2021 1359   BUN 18 02/02/2019 0834   CREATININE 1.19 12/29/2021 1359   CREATININE 1.42 (H) 10/26/2021 1427   CALCIUM 9.1 12/29/2021 1359   GFRNONAA >60 12/29/2021 1359   GFRNONAA 62 01/01/2020 1549   GFRAA 72 01/01/2020 1549    Lipid Panel     Component Value Date/Time   CHOL 139 10/26/2021 1427   CHOL 149 02/02/2019 0834   TRIG 170 (H) 10/26/2021 1427   HDL 58 10/26/2021 1427   HDL 67 02/02/2019 0834   CHOLHDL 2.4 10/26/2021 1427   VLDL 23 01/08/2015 0225  LDLCALC 56 10/26/2021 1427    CBC    Component Value Date/Time   WBC 6.8 12/29/2021 1359   RBC 4.77 12/29/2021 1359   HGB 15.1  12/29/2021 1359   HCT 45.5 12/29/2021 1359   PLT 275 12/29/2021 1359   MCV 95.4 12/29/2021 1359   MCH 31.7 12/29/2021 1359   MCHC 33.2 12/29/2021 1359   RDW 11.9 12/29/2021 1359   LYMPHSABS 1,554 01/01/2020 1549   MONOABS 0.6 08/27/2018 1501   EOSABS 333 01/01/2020 1549   BASOSABS 37 01/01/2020 1549    Hgb A1C Lab Results  Component Value Date   HGBA1C 5.9 (H) 10/26/2021           Assessment & Plan:   Fever, Stuffy and Runny Nose, Cough, Triceps Tendon Tear:  No indication for repeat flu or COVID testing at this time We will check CBC and BMET today If WBC count elevated, would be concern for persistent URI infection versus postsurgical infection Take Tylenol or Ibuprofen OTC for fever Would recommend Zyrtec and Flonase OTC for cough and congestion Would avoid prednisone given that he is in the postsurgical.  RTC in 3 months for follow-up of chronic conditions Webb Silversmith, NP

## 2022-02-15 NOTE — Patient Instructions (Signed)
Depression Screening Depression screening is a tool that your health care provider can use to learn if you have symptoms of depression. Depression is a common condition with many symptoms that are also often found in other conditions. Depression is treatable, but it must first be diagnosed. You may not know that certain feelings, thoughts, and behaviors that you are having can be symptoms of depression. Taking a depression screening test can help you and your health care provider decide if you need more assessment, or if you should be referred to a mental health care provider. What are the screening tests? You may have a physical exam to see if another condition is affecting your mental health. You may have a blood or urine sample taken during the physical exam. You may be interviewed or offered a written test using a screening tool that was developed from research, such as one of these: Patient Health Questionnaire (PHQ). This is a set of either 2 or 9 questions. A health care provider who has been trained to score this screening test uses a guide to assess if your symptoms suggest that you may have depression. Hamilton Depression Rating Scale (HAM-D). This is a set of either 17 or 24 questions. You may be asked to take it again during or after your treatment, to see if your depression has gotten better. Beck Depression Inventory (BDI). This is a set of 21 multiple choice questions. Your health care provider scores your answers to assess: Your level of depression, ranging from mild to severe. Your response to treatment. Your health care provider may talk with you about your daily activities, such as eating, sleeping, work, and recreation, and ask if you have had any changes in activity. Your health care provider may ask you to see a mental health specialist, such as a psychiatrist or psychologist, for more evaluation. Who should be screened for depression?  All adults, including adults with a family  history of a mental health disorder. People who are 86-3 years old. People who are recovering from an acute condition, such as myocardial infarction (MI) or stroke. Pregnant women, or women who have given birth. People who have a long-term (chronic) illness. Anyone who has been diagnosed with another type of mental health disorder. Anyone who has symptoms that could show depression. What do my results mean? Your health care provider will review the results of your depression screening, physical exam, and lab tests. Positive screens suggest that you may have depression. Screening is the first step in getting the care that you may need. It will be important for you to know the results of your tests. Ask your health care provider, or the department that is doing your screening tests, when your results will be ready. Talk with your health care provider about your results, diagnosis, and recommendations for follow-up. A diagnosis of depression is made using information from the Diagnostic and Statistical Manual of Mental Disorders (DSM-5). This is a book that lists the number and type of symptoms that must be present for a health care provider to give a specific diagnosis. Your health care provider may work with you to treat your symptoms of depression, or your health care provider may help you find a mental health provider who can assess and help you develop a plan to treat your depression. Get help right away if: You have thoughts about hurting yourself or others. If you ever feel like you may hurt yourself or others, or have thoughts about taking your own life,  get help right away. Go to your nearest emergency department or: Call your local emergency services (911 in the [place] Call a suicide crisis helpline, such as the [place] at 613-640-1281 or 988 in the [place]. This is open 24 hours a [place] in the [place] Text the Crisis Text Line at 667-546-0561 (in the  [place].). Summary Depression screening is the first step in getting the help that you may need. If your screening test shows symptoms of depression (is positive), your health care provider may ask you to see a mental health provider who will help identify ways to treat your depression. Anyone aged 68 or older should be screened for depression. This information is not intended to replace advice given to you by your health care provider. Make sure you discuss any questions you have with your health care provider. Document Revised: 08/03/2020 Document Reviewed: 04/18/2020 Elsevier Patient Education  [place].

## 2022-02-16 ENCOUNTER — Telehealth: Payer: Self-pay

## 2022-02-16 LAB — BASIC METABOLIC PANEL WITH GFR
BUN: 19 mg/dL (ref 7–25)
CO2: 32 mmol/L (ref 20–32)
Calcium: 9.8 mg/dL (ref 8.6–10.3)
Chloride: 101 mmol/L (ref 98–110)
Creat: 1.15 mg/dL (ref 0.70–1.35)
Glucose, Bld: 93 mg/dL (ref 65–99)
Potassium: 4.4 mmol/L (ref 3.5–5.3)
Sodium: 140 mmol/L (ref 135–146)
eGFR: 69 mL/min/{1.73_m2} (ref >=60)

## 2022-02-16 LAB — CBC
HCT: 43.3 % (ref 38.5–50.0)
Hemoglobin: 15 g/dL (ref 13.2–17.1)
MCH: 32.8 pg (ref 27.0–33.0)
MCHC: 34.6 g/dL (ref 32.0–36.0)
MCV: 94.5 fL (ref 80.0–100.0)
MPV: 9.6 fL (ref 7.5–12.5)
Platelets: 458 10*3/uL — ABNORMAL HIGH (ref 140–400)
RBC: 4.58 10*6/uL (ref 4.20–5.80)
RDW: 11.8 % (ref 11.0–15.0)
WBC: 6.7 10*3/uL (ref 3.8–10.8)

## 2022-02-16 NOTE — Telephone Encounter (Signed)
There is no indication that he needs another antibiotic.  His WBC count was normal.  His exam was benign.  I think his cough is just lingering and this can last 4 to 6 weeks after the flu.

## 2022-02-16 NOTE — Telephone Encounter (Signed)
Copied from [place] [phone]. Topic: General - Inquiry >> Feb 16, 2022  1:52 PM Ludger Nutting wrote: Patient's wife called to see if Rollene Fare was going to send in an antibiotic for patient now that the cbc results have come back. Please follow up with patient.

## 2022-02-16 NOTE — Telephone Encounter (Signed)
Pt's wife advised.   Thanks,   -[place]  

## 2022-03-05 ENCOUNTER — Other Ambulatory Visit: Payer: Self-pay | Admitting: Cardiovascular Disease

## 2022-03-09 ENCOUNTER — Ambulatory Visit (INDEPENDENT_AMBULATORY_CARE_PROVIDER_SITE_OTHER): Payer: Medicare Other | Admitting: Internal Medicine

## 2022-03-09 ENCOUNTER — Encounter: Payer: Self-pay | Admitting: Internal Medicine

## 2022-03-09 VITALS — BP 116/76 | HR 93 | Temp 96.7°F | Ht 72.0 in | Wt 270.0 lb

## 2022-03-09 DIAGNOSIS — M542 Cervicalgia: Secondary | ICD-10-CM | POA: Diagnosis not present

## 2022-03-09 DIAGNOSIS — Z024 Encounter for examination for driving license: Secondary | ICD-10-CM

## 2022-03-09 DIAGNOSIS — M5416 Radiculopathy, lumbar region: Secondary | ICD-10-CM | POA: Diagnosis not present

## 2022-03-09 DIAGNOSIS — Z79891 Long term (current) use of opiate analgesic: Secondary | ICD-10-CM | POA: Diagnosis not present

## 2022-03-09 DIAGNOSIS — M961 Postlaminectomy syndrome, not elsewhere classified: Secondary | ICD-10-CM | POA: Diagnosis not present

## 2022-03-09 DIAGNOSIS — G894 Chronic pain syndrome: Secondary | ICD-10-CM | POA: Diagnosis not present

## 2022-03-09 DIAGNOSIS — Z5181 Encounter for therapeutic drug level monitoring: Secondary | ICD-10-CM | POA: Diagnosis not present

## 2022-03-09 NOTE — Progress Notes (Signed)
Commercial Driver Medical Examination   Robert Greer is a 67 y.o. male who presents today for a commercial driver fitness determination physical exam. The patient reports no problems. The following portions of the patient's history were reviewed and updated as appropriate: allergies, current medications, past family history, past medical history, past social history, past surgical history, and problem list. Review of Systems  Past Medical History:  Diagnosis Date   Anxiety    Arthritis    Back pain    CAD (coronary artery disease)    a.) NSTEMI 01/07/2015; b.) LHC/PCI 01/10/2015: LM nl, LAD 85p/m (overlapping 2.75 x 22 mm (pLAD), 2.5 x 22 mm (mLAD), and 2.75 x 12 mm (mLAD) Resolute Integrity DES), D1 50ost, D2/3 small/nl, LCX 60d, OM1 nl, OM2 min irregs, OM3 nl, RCA 172m(likely culprit w/ L->R, R->R collats-->Med Rx). Nl EF.   Complication of anesthesia 2010   a[place]"   Depressive disorder, not elsewhere classified    GERD (gastroesophageal reflux disease)    Gout    History of echocardiogram    a. 12/2014 Echo: EF 60-65%, no rwma, mild MR. Nl RV fxn.   Hyperlipidemia    Hypertension    Lipoma of back 02/09/2015   Long term current use of antithrombotics/antiplatelets    a.) on DAPT (ASA + clopidogrel)   Measles    Mobitz (type) II atrioventricular block    Mumps    [place]   a.) troponins were trended: 0.63 --> 3.16 --> 10.08 --> 9.73; b.) LHC/PCI 01/10/2015 --> overlapping 2.75 x 22 mm (pLAD), 2.5 x 22 mm (mLAD), and 2.75 x 12 mm (mLAD) Resolute Integrity DES   Obesity    OSA treated with BiPAP    Other hemochromatosis 12/05/2016    Current Outpatient Medications  Medication Sig Dispense Refill   aspirin 81 MG chewable tablet Chew 1 tablet (81 mg total) by mouth daily. 30 tablet 3   atorvastatin (LIPITOR) 20 MG tablet Take 1 tablet (20 mg total) by  mouth daily. 90 tablet 2   azithromycin (ZITHROMAX) 250 MG tablet Take 2 tabs today, then 1 tab daily x 4 days 6 tablet 0   buPROPion (WELLBUTRIN XL) 150 MG 24 hr tablet Take 1 tablet (150 mg total) by mouth daily. 90 tablet 1   cetirizine (ZYRTEC) 10 MG tablet Take 1 tablet (10 mg total) by mouth as needed for allergies. Reported on 02/09/2015 90 tablet 1   clopidogrel (PLAVIX) 75 MG tablet Take 1 tablet (75 mg total) by mouth daily. 90 tablet 0   colchicine 0.6 MG tablet Take 2 tabs PO at the first sign of a gout attack followed by 1 tab PO 1 hour later. 30 tablet 0   diclofenac Sodium (VOLTAREN) 1 % GEL Apply 2 g topically 4 (four) times daily. 100 g 1   DULoxetine (CYMBALTA) 60 MG capsule Take 1 capsule (60 mg total) by mouth 2 (two) times daily. (Patient taking differently: Take 60 mg by mouth every morning.) 180 capsule 0   ezetimibe (ZETIA) 10 MG tablet Take 1 tablet (10 mg total) by mouth daily. 90 tablet 0   oxyCODONE-acetaminophen (PERCOCET) 10-325 MG tablet Take 1 tablet by mouth 2 (two) times daily.     pregabalin (LYRICA) 75 MG capsule Take 75 mg by mouth 2 (two) times daily. 1 tab in the morning and 2 at bedtime  triamcinolone (NASACORT) 55 MCG/ACT AERO nasal inhaler Place 2 sprays into the nose daily. (Patient taking differently: Place 2 sprays into the nose as needed.) 1 Inhaler 12   valsartan-hydrochlorothiazide (DIOVAN-HCT) 160-12.5 MG tablet Take 1 tablet by mouth daily. (Patient taking differently: Take 1 tablet by mouth every morning.) 90 tablet 0   No current facility-administered medications for this visit.    Allergies  Allergen Reactions   Buspirone Other (See Comments)    Disliked the way it made him feel  Other reaction(s): Unknown  Did not like the way it made him feel   Levaquin [Levofloxacin] Rash    Family History  Problem Relation Age of Onset   Stroke Mother    Liver disease Father    Lung cancer Sister    Diabetes Sister    Liver disease Brother     Lung cancer Paternal Grandmother     Social History   Socioeconomic History   Marital status: Married    Spouse name: Not on file   Number of children: Not on file   Years of education: Not on file   Highest education level: Not on file  Occupational History   Not on file  Tobacco Use   Smoking status: Never   Smokeless tobacco: Current    Types: Snuff  Vaping Use   Vaping Use: Never used  Substance and Sexual Activity   Alcohol use: Yes    Alcohol/week: 2.0 - 3.0 standard drinks of alcohol    Types: 2 - 3 Cans of beer per week    Comment: ocassionally   Drug use: No   Sexual activity: Yes  Other Topics Concern   Not on file  Social History Narrative   Not on file   Social Determinants of Health   Financial Resource Strain: Low Risk  (04/12/2020)   Overall Financial Resource Strain (CARDIA)    Difficulty of Paying Living Expenses: Not hard at all  Food Insecurity: No Food Insecurity (04/12/2020)   Hunger Vital Sign    Worried About Running Out of Food in the Last Year: Never true    Ran Out of Food in the Last Year: Never true  Transportation Needs: No Transportation Needs (04/12/2020)   PRAPARE - Hydrologist (Medical): No    Lack of Transportation (Non-Medical): No  Physical Activity: Inactive (04/12/2020)   Exercise Vital Sign    Days of Exercise per Week: 0 days    Minutes of Exercise per Session: 0 min  Stress: No Stress Concern Present (04/12/2020)   [place]    Feeling of Stress : Not at all  Social Connections: Not on file  Intimate Partner Violence: Not on file     Constitutional: Denies fever, malaise, fatigue, headache or abrupt weight changes.  HEENT: Denies eye pain, eye redness, ear pain, ringing in the ears, wax buildup, runny nose, nasal congestion, bloody nose, or sore throat. Respiratory: Denies difficulty breathing, shortness of breath, cough or  sputum production.   Cardiovascular: Denies chest pain, chest tightness, palpitations or swelling in the hands or feet.  Gastrointestinal: Denies abdominal pain, bloating, constipation, diarrhea or blood in the stool.  GU: Denies urgency, frequency, pain with urination, burning sensation, blood in urine, odor or discharge. Musculoskeletal: Patient reports chronic joint pain.  Denies decrease in range of motion, difficulty with gait, muscle pain or joint swelling.  Skin: Denies redness, rashes, lesions or ulcercations.  Neurological: Denies dizziness,  difficulty with memory, difficulty with speech or problems with balance and coordination.  Psych: Patient has a history of anxiety and depression.  Denies SI/HI.  No other specific complaints in a complete review of systems (except as listed in HPI above).   Objective:    Vision:  Uncorrected Corrected Horizontal Field of Vision  Right Eye 20/30  70 degrees  Left Eye  20/30  70 degrees  Both Eyes  [id number]     Applicant can recognize and distinguish among traffic control signals and devices showing standard red, green, and amber colors.     Monocular Vision?: no   Hearing:        Right Ear  > 5 ft     Left Ear  > 73f         BP 116/76 (BP Location: Left Arm, Patient Position: Sitting, Cuff Size: Large)   Pulse 93   Temp (!) 96.7 F (35.9 C) (Temporal)   Ht 6' (1.829 m)   Wt 270 lb (122.5 kg)   SpO2 97%   BMI 36.62 kg/m   Wt Readings from Last 3 Encounters:  02/15/22 271 lb (122.9 kg)  02/06/22 276 lb (125.2 kg)  12/29/21 278 lb 7.1 oz (126.3 kg)    General: Appears his stated age, obese, in NAD. Skin: Warm, dry and intact.  HEENT: Head: normal shape and size; Eyes: sclera white, no icterus, conjunctiva pink, PERRLA and EOMs intact; Ears: Tm's gray and intact, normal light reflex; Nose: mucosa pink and moist, septum midline; Throat/Mouth: Teeth present, mucosa pink and moist, no exudate, lesions or ulcerations noted.   Neck:  Neck supple, trachea midline. No masses, lumps or thyromegaly present.  Cardiovascular: Normal rate and rhythm. S1,S2 noted.  No murmur, rubs or gallops noted. No JVD or BLE edema. No carotid bruits noted. Pulmonary/Chest: Normal effort and positive vesicular breath sounds. No respiratory distress. No wheezes, rales or ronchi noted.  Abdomen: Soft and nontender. Normal bowel sounds. No distention or masses noted. Liver, spleen and kidneys non palpable. Musculoskeletal: Normal range of motion.  Joint enlargement noted in hands but he has normal dexterity.  Strength 5/5 BUE/BLE.  No difficulty with gait.  Neurological: Alert and oriented. Cranial nerves II-XII grossly intact. Coordination normal.  Psychiatric: Mood and affect normal. Behavior is normal. Judgment and thought content normal.    BMET    Component Value Date/Time   NA 140 02/15/2022 0915   NA 141 02/02/2019 0834   K 4.4 02/15/2022 0915   CL 101 02/15/2022 0915   CO2 32 02/15/2022 0915   GLUCOSE 93 02/15/2022 0915   BUN 19 02/15/2022 0915   BUN 18 02/02/2019 0834   CREATININE 1.15 02/15/2022 0915   CALCIUM 9.8 02/15/2022 0915   GFRNONAA >60 12/29/2021 1359   GFRNONAA 62 01/01/2020 1549   GFRAA 72 01/01/2020 1549    Lipid Panel     Component Value Date/Time   CHOL 139 10/26/2021 1427   CHOL 149 02/02/2019 0834   TRIG 170 (H) 10/26/2021 1427   HDL 58 10/26/2021 1427   HDL 67 02/02/2019 0834   CHOLHDL 2.4 10/26/2021 1427   VLDL 23 01/08/2015 0225   LDLCALC 56 10/26/2021 1427    CBC    Component Value Date/Time   WBC 6.7 02/15/2022 0915   RBC 4.58 02/15/2022 0915   HGB 15.0 02/15/2022 0915   HCT 43.3 02/15/2022 0915   PLT 458 (H) 02/15/2022 0915   MCV 94.5 02/15/2022 0915   MCH 32.8  02/15/2022 0915   MCHC 34.6 02/15/2022 0915   RDW 11.8 02/15/2022 0915   LYMPHSABS 1,554 01/01/2020 1549   MONOABS 0.6 08/27/2018 1501   EOSABS 333 01/01/2020 1549   BASOSABS 37 01/01/2020 1549    Hgb A1C Lab  Results  Component Value Date   HGBA1C 5.9 (H) 10/26/2021        Labs:  Urinalysis:  Specific gravity: 1.020  Blood: Negative  Protein: Negative  Glucose: Negative    Assessment:    Healthy male exam.  Meets standards, but periodic monitoring required due to HTN.  Driver qualified only for 1 year.    Plan:    Medical examiners certificate completed and printed. Return as needed.   Webb Silversmith, NP

## 2022-03-28 DIAGNOSIS — E6609 Other obesity due to excess calories: Secondary | ICD-10-CM | POA: Insufficient documentation

## 2022-03-28 DIAGNOSIS — E669 Obesity, unspecified: Secondary | ICD-10-CM | POA: Insufficient documentation

## 2022-03-28 DIAGNOSIS — M961 Postlaminectomy syndrome, not elsewhere classified: Secondary | ICD-10-CM | POA: Insufficient documentation

## 2022-03-28 DIAGNOSIS — T8859XA Other complications of anesthesia, initial encounter: Secondary | ICD-10-CM | POA: Insufficient documentation

## 2022-03-28 DIAGNOSIS — E66812 Obesity, class 2: Secondary | ICD-10-CM | POA: Insufficient documentation

## 2022-03-28 DIAGNOSIS — Z7901 Long term (current) use of anticoagulants: Secondary | ICD-10-CM | POA: Insufficient documentation

## 2022-03-28 DIAGNOSIS — G8929 Other chronic pain: Secondary | ICD-10-CM | POA: Insufficient documentation

## 2022-04-09 ENCOUNTER — Telehealth: Payer: Self-pay | Admitting: Internal Medicine

## 2022-04-09 NOTE — Telephone Encounter (Signed)
Contacted Tillmon R Antunes to schedule their annual wellness visit. Call back at later date: 05/2022 having knee surgery  Sherol Dade; [place]: [phone]

## 2022-04-27 DIAGNOSIS — S46302D Unspecified injury of muscle, fascia and tendon of triceps, left arm, subsequent encounter: Secondary | ICD-10-CM | POA: Diagnosis not present

## 2022-04-27 DIAGNOSIS — M25522 Pain in left elbow: Secondary | ICD-10-CM | POA: Diagnosis not present

## 2022-04-30 DIAGNOSIS — M1711 Unilateral primary osteoarthritis, right knee: Secondary | ICD-10-CM | POA: Diagnosis not present

## 2022-04-30 DIAGNOSIS — M25561 Pain in right knee: Secondary | ICD-10-CM | POA: Diagnosis not present

## 2022-04-30 DIAGNOSIS — M25522 Pain in left elbow: Secondary | ICD-10-CM | POA: Diagnosis not present

## 2022-04-30 DIAGNOSIS — M109 Gout, unspecified: Secondary | ICD-10-CM | POA: Diagnosis not present

## 2022-04-30 DIAGNOSIS — E669 Obesity, unspecified: Secondary | ICD-10-CM | POA: Diagnosis not present

## 2022-04-30 DIAGNOSIS — G8929 Other chronic pain: Secondary | ICD-10-CM | POA: Diagnosis not present

## 2022-04-30 DIAGNOSIS — Z6837 Body mass index (BMI) 37.0-37.9, adult: Secondary | ICD-10-CM | POA: Diagnosis not present

## 2022-04-30 DIAGNOSIS — S46302D Unspecified injury of muscle, fascia and tendon of triceps, left arm, subsequent encounter: Secondary | ICD-10-CM | POA: Diagnosis not present

## 2022-05-01 ENCOUNTER — Other Ambulatory Visit: Payer: Self-pay

## 2022-05-01 ENCOUNTER — Encounter
Admission: RE | Admit: 2022-05-01 | Discharge: 2022-05-01 | Disposition: A | Discharge status: Home / Self Care | Payer: Medicare Other | Source: Ambulatory Visit | Attending: Orthopedic Surgery | Admitting: Orthopedic Surgery

## 2022-05-01 ENCOUNTER — Encounter: Payer: Self-pay | Admitting: Oncology

## 2022-05-01 DIAGNOSIS — Z01818 Encounter for other preprocedural examination: Secondary | ICD-10-CM | POA: Diagnosis not present

## 2022-05-01 DIAGNOSIS — Z01812 Encounter for preprocedural laboratory examination: Secondary | ICD-10-CM

## 2022-05-01 DIAGNOSIS — I252 Old myocardial infarction: Secondary | ICD-10-CM | POA: Diagnosis not present

## 2022-05-01 DIAGNOSIS — M1711 Unilateral primary osteoarthritis, right knee: Secondary | ICD-10-CM | POA: Diagnosis not present

## 2022-05-01 DIAGNOSIS — I25118 Atherosclerotic heart disease of native coronary artery with other forms of angina pectoris: Secondary | ICD-10-CM

## 2022-05-01 LAB — COMPREHENSIVE METABOLIC PANEL
ALT: 39 U/L (ref 0–44)
AST: 32 U/L (ref 15–41)
Albumin: 4.5 g/dL (ref 3.5–5.0)
Alkaline Phosphatase: 71 U/L (ref 38–126)
Anion gap: 8 (ref 5–15)
BUN: 23 mg/dL (ref 8–23)
CO2: 28 mmol/L (ref 22–32)
Calcium: 9.4 mg/dL (ref 8.9–10.3)
Chloride: 104 mmol/L (ref 98–111)
Creatinine, Ser: 1.04 mg/dL (ref 0.61–1.24)
GFR, Estimated: >60 mL/min (ref >60)
Glucose, Bld: 107 mg/dL — ABNORMAL HIGH (ref 70–99)
Potassium: 4.1 mmol/L (ref 3.5–5.1)
Sodium: 140 mmol/L (ref 135–145)
Total Bilirubin: 0.7 mg/dL (ref 0.3–1.2)
Total Protein: 7.8 g/dL (ref 6.5–8.1)

## 2022-05-01 LAB — TYPE AND SCREEN
ABO/RH(D): A POS
Antibody Screen: NEGATIVE

## 2022-05-01 LAB — CBC
HCT: 45.5 % (ref 39.0–52.0)
Hemoglobin: 15 g/dL (ref 13.0–17.0)
MCH: 31.5 pg (ref 26.0–34.0)
MCHC: 33 g/dL (ref 30.0–36.0)
MCV: 95.6 fL (ref 80.0–100.0)
Platelets: 288 10*3/uL (ref 150–400)
RBC: 4.76 MIL/uL (ref 4.22–5.81)
RDW: 12.6 % (ref 11.5–15.5)
WBC: 8.2 10*3/uL (ref 4.0–10.5)
nRBC: 0 % (ref 0.0–0.2)

## 2022-05-01 LAB — SURGICAL PCR SCREEN
MRSA, PCR: NEGATIVE
Staphylococcus aureus: NEGATIVE

## 2022-05-01 LAB — URINALYSIS, ROUTINE W REFLEX MICROSCOPIC
Bilirubin Urine: NEGATIVE
Glucose, UA: NEGATIVE mg/dL
Hgb urine dipstick: NEGATIVE
Ketones, ur: NEGATIVE mg/dL
Leukocytes,Ua: NEGATIVE
Nitrite: NEGATIVE
Protein, ur: NEGATIVE mg/dL
Specific Gravity, Urine: 1.02 (ref 1.005–1.030)
pH: 5 (ref 5.0–8.0)

## 2022-05-01 LAB — C-REACTIVE PROTEIN: CRP: 0.8 mg/dL (ref <1.0)

## 2022-05-01 LAB — SEDIMENTATION RATE: Sed Rate: 6 mm/hr (ref 0–20)

## 2022-05-01 NOTE — Patient Instructions (Addendum)
Your procedure is scheduled on: 05/09/22 - Wednesday Report to the Registration Desk on the 1st floor of the [facility]. To find out your arrival time, please call [phone] between 1PM - 3PM on: 05/08/22 - Tuesday If your arrival time is 6:00 am, do not arrive before that time as the [facility] entrance doors do not open until 6:00 am.  REMEMBER: Instructions that are not followed completely may result in serious medical risk, up to and including death; or upon the discretion of your surgeon and anesthesiologist your surgery may need to be rescheduled.  Do not eat food after midnight the night before surgery.  No gum chewing or hard candies.  You may however, drink CLEAR liquids up to 2 hours before you are scheduled to arrive for your surgery. Do not drink anything within 2 hours of your scheduled arrival time.  Clear liquids include: - water  - apple juice without pulp - gatorade (not RED colors) - black coffee or tea (Do NOT add milk or creamers to the coffee or tea) Do NOT drink anything that is not on this list.  In addition, your doctor has ordered for you to drink the provided:  Ensure Pre-Surgery Clear Carbohydrate Drink  Drinking this carbohydrate drink up to two hours before surgery helps to reduce insulin resistance and improve patient outcomes. Please complete drinking 2 hours before scheduled arrival time.  One week prior to surgery: Stop Anti-inflammatories (NSAIDS) such as Advil, Aleve, Ibuprofen, Motrin, Naproxen, Naprosyn and Aspirin based products such as Excedrin, Goody's Powder, BC Powder.  Stop ANY OVER THE COUNTER supplements until after surgery. MultiVitamin  You may however, continue to take Tylenol if needed for pain up until the [place] of surgery.  Continue taking all prescribed medications with the exception of the following:  Hold clopidogrel (PLAVIX) 7 days prior to your surgery, last dose on 05/01/22. Hold valsartan-hydrochlorothiazide on the  morning of surgery.  TAKE ONLY THESE MEDICATIONS THE MORNING OF SURGERY WITH A SIP OF WATER:  atorvastatin (LIPITOR)  buPROPion (WELLBUTRIN XL)  DULoxetine (CYMBALTA)  ezetimibe (ZETIA)  oxyCODONE-acetaminophen (PERCOCET)  pregabalin (LYRICA)    No Alcohol for 24 hours before or after surgery.  No Smoking including e-cigarettes for 24 hours before surgery.  No chewable tobacco products for at least 6 hours before surgery.  No nicotine patches on the [place] of surgery.  Do not use any "recreational" drugs for at least a week (preferably 2 weeks) before your surgery.  Please be advised that the combination of cocaine and anesthesia may have negative outcomes, up to and including death. If you test positive for cocaine, your surgery will be cancelled.  On the morning of surgery brush your teeth with toothpaste and water, you may rinse your mouth with mouthwash if you wish. Do not swallow any toothpaste or mouthwash.  Use CHG Soap or wipes as directed on instruction sheet.  Do not wear jewelry, make-up, hairpins, clips or nail polish.  Do not wear lotions, powders, or perfumes.   Do not shave body hair from the neck down 48 hours before surgery.  Contact lenses, hearing aids and dentures may not be worn into surgery.  Do not bring valuables to the hospital. [facility] is not responsible for any missing/lost belongings or valuables.   Bring your Bi-PAP to the hospital in case you may have to spend the night.   Notify your doctor if there is any change in your medical condition (cold, fever, infection).  Wear comfortable  clothing (specific to your surgery type) to the hospital.  After surgery, you can help prevent lung complications by doing breathing exercises.  Take deep breaths and cough every 1-2 hours. Your doctor may order a device called an Incentive Spirometer to help you take deep breaths. When coughing or sneezing, hold a pillow firmly against your incision with both  hands. This is called "splinting." Doing this helps protect your incision. It also decreases belly discomfort.  If you are being admitted to the hospital overnight, leave your suitcase in the car. After surgery it may be brought to your room.  In case of increased patient census, it may be necessary for you, the patient, to continue your postoperative care in the [facility] department.  If you are being discharged the [place] of surgery, you will not be allowed to drive home. You will need a responsible individual to drive you home and stay with you for 24 hours after surgery.   If you are taking public transportation, you will need to have a responsible individual with you.  Please call the Pre-admissions Testing Dept. at [phone] if you have any questions about these instructions.  Surgery Visitation Policy:  Patients having surgery or a procedure may have two visitors.  Children under the age of 18 must have an adult with them who is not the patient.  Inpatient Visitation:    Visiting hours are 7 a.m. to 8 p.m. Up to four visitors are allowed at one time in a patient room. The visitors may rotate out with other people during the [place].  One visitor age 68 or older may stay with the patient overnight and must be in the room by 8 p.m.    Preparing for Surgery with CHLORHEXIDINE GLUCONATE (CHG) Soap  Chlorhexidine Gluconate (CHG) Soap  o An antiseptic cleaner that kills germs and bonds with the skin to continue killing germs even after washing  o Used for showering the night before surgery and morning of surgery  Before surgery, you can play an important role by reducing the number of germs on your skin.  CHG (Chlorhexidine gluconate) soap is an antiseptic cleanser which kills germs and bonds with the skin to continue killing germs even after washing.  Please do not use if you have an allergy to CHG or antibacterial soaps. If your skin becomes reddened/irritated stop using  the CHG.  1. Shower the NIGHT BEFORE SURGERY and the MORNING OF SURGERY with CHG soap.  2. If you choose to wash your hair, wash your hair first as usual with your normal shampoo.  3. After shampooing, rinse your hair and body thoroughly to remove the shampoo.  4. Use CHG as you would any other liquid soap. You can apply CHG directly to the skin and wash gently with a scrungie or a clean washcloth.  5. Apply the CHG soap to your body only from the neck down. Do not use on open wounds or open sores. Avoid contact with your eyes, ears, mouth, and genitals (private parts). Wash face and genitals (private parts) with your normal soap.  6. Wash thoroughly, paying special attention to the area where your surgery will be performed.  7. Thoroughly rinse your body with warm water.  8. Do not shower/wash with your normal soap after using and rinsing off the CHG soap.  9. Pat yourself dry with a clean towel.  10. Wear clean pajamas to bed the night before surgery.  12. Place clean sheets on your  bed the night of your first shower and do not sleep with pets.  13. Shower again with the CHG soap on the [place] of surgery prior to arriving at the hospital.  14. Do not apply any deodorants/lotions/powders.  15. Please wear clean clothes to the hospital. How to Use an Incentive Spirometer  An incentive spirometer is a tool that measures how well you are filling your lungs with each breath. Learning to take long, deep breaths using this tool can help you keep your lungs clear and active. This may help to reverse or lessen your chance of developing breathing (pulmonary) problems, especially infection. You may be asked to use a spirometer: After a surgery. If you have a lung problem or a history of smoking. After a long period of time when you have been unable to move or be active. If the spirometer includes an indicator to show the highest number that you have reached, your health care provider or  respiratory therapist will help you set a goal. Keep a log of your progress as told by your health care provider. What are the risks? Breathing too quickly may cause dizziness or cause you to pass out. Take your time so you do not get dizzy or light-headed. If you are in pain, you may need to take pain medicine before doing incentive spirometry. It is harder to take a deep breath if you are having pain. How to use your incentive spirometer  Sit up on the edge of your bed or on a chair. Hold the incentive spirometer so that it is in an upright position. Before you use the spirometer, breathe out normally. Place the mouthpiece in your mouth. Make sure your lips are closed tightly around it. Breathe in slowly and as deeply as you can through your mouth, causing the piston or the ball to rise toward the top of the chamber. Hold your breath for 3-5 seconds, or for as long as possible. If the spirometer includes a coach indicator, use this to guide you in breathing. Slow down your breathing if the indicator goes above the marked areas. Remove the mouthpiece from your mouth and breathe out normally. The piston or ball will return to the bottom of the chamber. Rest for a few seconds, then repeat the steps 10 or more times. Take your time and take a few normal breaths between deep breaths so that you do not get dizzy or light-headed. Do this every 1-2 hours when you are awake. If the spirometer includes a goal marker to show the highest number you have reached (best effort), use this as a goal to work toward during each repetition. After each set of 10 deep breaths, cough a few times. This will help to make sure that your lungs are clear. If you have an incision on your chest or abdomen from surgery, place a pillow or a rolled-up towel firmly against the incision when you cough. This can help to reduce pain while taking deep breaths and coughing. General tips When you are able to get out of bed: Walk  around often. Continue to take deep breaths and cough in order to clear your lungs. Keep using the incentive spirometer until your health care provider says it is okay to stop using it. If you have been in the hospital, you may be told to keep using the spirometer at home. Contact a health care provider if: You are having difficulty using the spirometer. You have trouble using the spirometer as often as  instructed. Your pain medicine is not giving enough relief for you to use the spirometer as told. You have a fever. Get help right away if: You develop shortness of breath. You develop a cough with bloody mucus from the lungs. You have fluid or blood coming from an incision site after you cough. Summary An incentive spirometer is a tool that can help you learn to take long, deep breaths to keep your lungs clear and active. You may be asked to use a spirometer after a surgery, if you have a lung problem or a history of smoking, or if you have been inactive for a long period of time. Use your incentive spirometer as instructed every 1-2 hours while you are awake. If you have an incision on your chest or abdomen, place a pillow or a rolled-up towel firmly against your incision when you cough. This will help to reduce pain. Get help right away if you have shortness of breath, you cough up bloody mucus, or blood comes from your incision when you cough. This information is not intended to replace advice given to you by your health care provider. Make sure you discuss any questions you have with your health care provider. Document Revised: 03/30/2019 Document Reviewed: 03/30/2019 Elsevier Patient Education  2023 Elsevier Inc. POLAR CARE INFORMATION  MassAdvertisement.it  How to use [facility] Therapy System?  YouTube   ShippingScam.co.uk  OPERATING INSTRUCTIONS  Start the product With dry hands, connect the transformer to the electrical connection located on  the top of the cooler. Next, plug the transformer into an appropriate electrical outlet. The unit will automatically start running at this point.  To stop the pump, disconnect electrical power.  Unplug to stop the product when not in use. Unplugging the Polar Care unit turns it off. Always unplug immediately after use. Never leave it plugged in while unattended. Remove pad.    FIRST ADD WATER TO FILL LINE, THEN ICE---Replace ice when existing ice is almost melted  1 Discuss Treatment with your Licensed Health Care Practitioner and Use Only as Prescribed 2 Apply Insulation Barrier & Cold Therapy Pad 3 Check for Moisture 4 Inspect Skin Regularly  Tips and Trouble Shooting Usage Tips 1. Use cubed or chunked ice for optimal performance. 2. It is recommended to drain the Pad between uses. To drain the pad, hold the Pad upright with the hose pointed toward the ground. Depress the black plunger and allow water to drain out. 3. You may disconnect the Pad from the unit without removing the pad from the affected area by depressing the silver tabs on the hose coupling and gently pulling the hoses apart. The Pad and unit will seal itself and will not leak. Note: Some dripping during release is normal. 4. DO NOT RUN PUMP WITHOUT WATER! The pump in this unit is designed to run with water. Running the unit without water will cause permanent damage to the pump. 5. Unplug unit before removing lid.  TROUBLESHOOTING GUIDE Pump not running, Water not flowing to the pad, Pad is not getting cold 1. Make sure the transformer is plugged into the wall outlet. 2. Confirm that the ice and water are filled to the indicated levels. 3. Make sure there are no kinks in the pad. 4. Gently pull on the blue tube to make sure the tube/pad junction is straight. 5. Remove the pad from the treatment site and ll it while the pad is lying at; then reapply. 6. Confirm that the  pad couplings are securely attached to the unit. Listen  for the double clicks (Figure 1) to confirm the pad couplings are securely attached.  Leaks    Note: Some condensation on the lines, controller, and pads is unavoidable, especially in warmer climates. 1. If using a [facility] Polar Care Cold Therapy unit with a detachable Cold Therapy Pad, and a leak exists (other than condensation on the lines) disconnect the pad couplings. Make sure the silver tabs on the couplings are depressed before reconnecting the pad to the pump hose; then confirm both sides of the coupling are properly clicked in. 2. If the coupling continues to leak or a leak is detected in the pad itself, stop using it and call [employer] Customer Care at [phone].  Cleaning After use, empty and dry the unit with a soft cloth. Warm water and mild detergent may be used occasionally to clean the pump and tubes.  WARNING: The Polar Care Cube can be cold enough to cause serious injury, including full skin necrosis. Follow these Operating Instructions, and carefully read the Product Insert (see pouch on side of unit) and the Cold Therapy Pad Fitting Instructions (provided with each Cold Therapy Pad) prior to use.

## 2022-05-04 ENCOUNTER — Ambulatory Visit (INDEPENDENT_AMBULATORY_CARE_PROVIDER_SITE_OTHER): Payer: Medicare Other | Admitting: Internal Medicine

## 2022-05-04 ENCOUNTER — Encounter: Payer: Self-pay | Admitting: Internal Medicine

## 2022-05-04 VITALS — BP 134/82 | HR 80 | Temp 96.8°F | Wt 270.0 lb

## 2022-05-04 DIAGNOSIS — S46312D Strain of muscle, fascia and tendon of triceps, left arm, subsequent encounter: Secondary | ICD-10-CM | POA: Diagnosis not present

## 2022-05-04 DIAGNOSIS — J301 Allergic rhinitis due to pollen: Secondary | ICD-10-CM

## 2022-05-04 MED ORDER — PREDNISONE 20 MG PO TABS: 20.0000 mg | ORAL_TABLET | Freq: Every day | ORAL | 0 refills | Status: DC

## 2022-05-04 NOTE — Patient Instructions (Signed)

## 2022-05-04 NOTE — Progress Notes (Signed)
Subjective:    Patient ID: Robert Greer, male    DOB: 01-19-1956, 67 y.o.   MRN: [id number]  HPI  Pt reports watery eyes, runny nose, nasal congestion. This started 5 days ago.  He denies headache, ear pain, sore throat, cough, shortness of breath, nausea, vomiting or diarrhea.  He denies fever, chills or body aches.  He has tried Zyrtec and Sudafed with minimal relief of symptoms. He is scheduled to have a right knee replacement next week.   Review of Systems     Past Medical History:  Diagnosis Date   Anxiety    Arthritis    Back pain    CAD (coronary artery disease)    a.) NSTEMI 01/07/2015; b.) LHC/PCI 01/10/2015: LM nl, LAD 85p/m (overlapping 2.75 x 22 mm (pLAD), 2.5 x 22 mm (mLAD), and 2.75 x 12 mm (mLAD) Resolute Integrity DES), D1 50ost, D2/3 small/nl, LCX 60d, OM1 nl, OM2 min irregs, OM3 nl, RCA 180m (likely culprit w/ L->R, R->R collats-->Med Rx). Nl EF.   Complication of anesthesia 2010   a.) cardiac arrest in OR during back surgery done in [place]; "never told me why"   Depressive disorder, not elsewhere classified    GERD (gastroesophageal reflux disease)    Gout    History of echocardiogram    a. 12/2014 Echo: EF 60-65%, no rwma, mild MR. Nl RV fxn.   Hyperlipidemia    Hypertension    Lipoma of back 02/09/2015   Long term current use of antithrombotics/antiplatelets    a.) on DAPT (ASA + clopidogrel)   Measles    Mobitz (type) II atrioventricular block    Mumps    NSTEMI (non-ST elevated myocardial infarction) 01/07/2015   a.) troponins were trended: 0.63 --> 3.16 --> 10.08 --> 9.73; b.) LHC/PCI 01/10/2015 --> overlapping 2.75 x 22 mm (pLAD), 2.5 x 22 mm (mLAD), and 2.75 x 12 mm (mLAD) Resolute Integrity DES   Obesity    OSA treated with BiPAP    Other hemochromatosis 12/05/2016    Current Outpatient Medications  Medication Sig Dispense Refill   aspirin 81 MG chewable tablet Chew 1 tablet (81 mg total) by mouth daily. 30 tablet 3   atorvastatin (LIPITOR) 20 MG  tablet Take 1 tablet (20 mg total) by mouth daily. 90 tablet 2   buPROPion (WELLBUTRIN XL) 150 MG 24 hr tablet Take 1 tablet (150 mg total) by mouth daily. 90 tablet 1   celecoxib (CELEBREX) 200 MG capsule Take 200 mg by mouth daily.     cetirizine (ZYRTEC) 10 MG tablet Take 1 tablet (10 mg total) by mouth as needed for allergies. Reported on 02/09/2015 90 tablet 1   clopidogrel (PLAVIX) 75 MG tablet Take 1 tablet (75 mg total) by mouth daily. 90 tablet 0   colchicine 0.6 MG tablet Take 2 tabs PO at the first sign of a gout attack followed by 1 tab PO 1 hour later. 30 tablet 0   diclofenac Sodium (VOLTAREN) 1 % GEL Apply 2 g topically 4 (four) times daily. 100 g 1   DULoxetine (CYMBALTA) 60 MG capsule Take 1 capsule (60 mg total) by mouth 2 (two) times daily. (Patient taking differently: Take 60 mg by mouth every morning.) 180 capsule 0   ezetimibe (ZETIA) 10 MG tablet Take 1 tablet (10 mg total) by mouth daily. 90 tablet 0   Multiple Vitamin (MULTIVITAMIN) capsule Take 1 capsule by mouth daily.     oxyCODONE-acetaminophen (PERCOCET) 10-325 MG tablet Take 1 tablet  by mouth 2 (two) times daily.     predniSONE (DELTASONE) 20 MG tablet Take 1 tablet (20 mg total) by mouth daily with breakfast. 4 tablet 0   pregabalin (LYRICA) 75 MG capsule Take 75 mg by mouth 2 (two) times daily. 1 tab in the morning and 2 at bedtime     triamcinolone (NASACORT) 55 MCG/ACT AERO nasal inhaler Place 2 sprays into the nose daily. (Patient taking differently: Place 2 sprays into the nose as needed.) 1 Inhaler 12   valsartan-hydrochlorothiazide (DIOVAN-HCT) 160-12.5 MG tablet Take 1 tablet by mouth daily. (Patient taking differently: Take 1 tablet by mouth every morning.) 90 tablet 0   No current facility-administered medications for this visit.    Allergies  Allergen Reactions   Levaquin [Levofloxacin] Rash    Family History  Problem Relation Age of Onset   Stroke Mother    Liver disease Father    Lung cancer  Sister    Diabetes Sister    Liver disease Brother    Lung cancer Paternal Grandmother     Social History   Socioeconomic History   Marital status: Married    Spouse name: Alvino Chapel   Number of children: Not on file   Years of education: Not on file   Highest education level: Not on file  Occupational History   Not on file  Tobacco Use   Smoking status: Never   Smokeless tobacco: Current    Types: Snuff  Vaping Use   Vaping Use: Never used  Substance and Sexual Activity   Alcohol use: Yes    Alcohol/week: 2.0 - 3.0 standard drinks of alcohol    Types: 2 - 3 Cans of beer per week    Comment: ocassionally   Drug use: No   Sexual activity: Yes  Other Topics Concern   Not on file  Social History Narrative   Not on file   Social Determinants of Health   Financial Resource Strain: Low Risk  (04/12/2020)   Overall Financial Resource Strain (CARDIA)    Difficulty of Paying Living Expenses: Not hard at all  Food Insecurity: No Food Insecurity (04/12/2020)   Hunger Vital Sign    Worried About Running Out of Food in the Last Year: Never true    Ran Out of Food in the Last Year: Never true  Transportation Needs: No Transportation Needs (04/12/2020)   PRAPARE - Administrator, Civil Service (Medical): No    Lack of Transportation (Non-Medical): No  Physical Activity: Inactive (04/12/2020)   Exercise Vital Sign    Days of Exercise per Week: 0 days    Minutes of Exercise per Session: 0 min  Stress: No Stress Concern Present (04/12/2020)   [employer] of Occupational Health - Occupational Stress Questionnaire    Feeling of Stress : Not at all  Social Connections: Not on file  Intimate Partner Violence: Not on file     Constitutional: Denies fever, malaise, fatigue, headache or abrupt weight changes.  HEENT: Patient reports runny nose and nasal congestion.  Denies eye pain, eye redness, ear pain, ringing in the ears, wax buildup, bloody nose, or sore  throat. Respiratory: Denies difficulty breathing, shortness of breath, cough or sputum production.   Cardiovascular: Denies chest pain, chest tightness, palpitations or swelling in the hands or feet.  Gastrointestinal: Denies abdominal pain, bloating, constipation, diarrhea or blood in the stool.  Neurological: Denies dizziness, difficulty with memory, difficulty with speech or problems with balance and coordination.  No other specific complaints in a complete review of systems (except as listed in HPI above).  Objective:   Physical Exam   BP 134/82 (BP Location: Right Arm, Patient Position: Sitting, Cuff Size: Normal)   Pulse 80   Temp (!) 96.8 F (36 C) (Temporal)   Wt 270 lb (122.5 kg)   SpO2 100%   BMI 36.62 kg/m  Wt Readings from Last 3 Encounters:  05/04/22 270 lb (122.5 kg)  03/09/22 270 lb (122.5 kg)  02/15/22 271 lb (122.9 kg)    General: Appears his stated age, obese, in NAD. Skin: Warm, dry and intact.  HEENT: Head: normal shape and size, no sinus tenderness noted; Eyes: sclera white, no icterus, conjunctiva pink, PERRLA and EOMs intact;  Nose: mucosa pink and moist, septum midline; Throat/Mouth: Teeth present, mucosa pink and moist, + PND, no exudate, lesions or ulcerations noted.  Neck: No adenopathy noted. Cardiovascular: Normal rate and rhythm. S1,S2 noted.  No murmur, rubs or gallops noted.  Pulmonary/Chest: Normal effort and positive vesicular breath sounds. No respiratory distress. No wheezes, rales or ronchi noted.  Neurological: Alert and oriented.   BMET    Component Value Date/Time   NA 140 05/01/2022 1517   NA 141 02/02/2019 0834   K 4.1 05/01/2022 1517   CL 104 05/01/2022 1517   CO2 28 05/01/2022 1517   GLUCOSE 107 (H) 05/01/2022 1517   BUN 23 05/01/2022 1517   BUN 18 02/02/2019 0834   CREATININE 1.04 05/01/2022 1517   CREATININE 1.15 02/15/2022 0915   CALCIUM 9.4 05/01/2022 1517   GFRNONAA >60 05/01/2022 1517   GFRNONAA 62 01/01/2020 1549    GFRAA 72 01/01/2020 1549    Lipid Panel     Component Value Date/Time   CHOL 139 10/26/2021 1427   CHOL 149 02/02/2019 0834   TRIG 170 (H) 10/26/2021 1427   HDL 58 10/26/2021 1427   HDL 67 02/02/2019 0834   CHOLHDL 2.4 10/26/2021 1427   VLDL 23 01/08/2015 0225   LDLCALC 56 10/26/2021 1427    CBC    Component Value Date/Time   WBC 8.2 05/01/2022 1517   RBC 4.76 05/01/2022 1517   HGB 15.0 05/01/2022 1517   HCT 45.5 05/01/2022 1517   PLT 288 05/01/2022 1517   MCV 95.6 05/01/2022 1517   MCH 31.5 05/01/2022 1517   MCHC 33.0 05/01/2022 1517   RDW 12.6 05/01/2022 1517   LYMPHSABS 1,554 01/01/2020 1549   MONOABS 0.6 08/27/2018 1501   EOSABS 333 01/01/2020 1549   BASOSABS 37 01/01/2020 1549    Hgb A1C Lab Results  Component Value Date   HGBA1C 5.9 (H) 10/26/2021           Assessment & Plan:   Allergic Rhinitis:  Continue Zyrtec OTC Rx for Prednisone 20 mg daily x 4 days No indication for antibiotics at this time  RTC in 1 month for follow-up of chronic conditions Nicki Reaper, NP
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
## 2022-05-05 NOTE — Discharge Instructions (Signed)
Instructions after Total Knee Replacement   James P. Hooten, Jr., M.D.     Dept. of Orthopaedics & Sports Medicine  [facility]  [address]  [place], [place]  [address]  Phone: [phone]   Fax: [phone]    DIET: Drink plenty of non-alcoholic fluids. Resume your normal diet. Include foods high in fiber.  ACTIVITY:  You may use crutches or a walker with weight-bearing as tolerated, unless instructed otherwise. You may be weaned off of the walker or crutches by your Physical Therapist.  Do NOT place pillows under the knee. Anything placed under the knee could limit your ability to straighten the knee.   Continue doing gentle exercises. Exercising will reduce the pain and swelling, increase motion, and prevent muscle weakness.   Please continue to use the TED compression stockings for 6 weeks. You may remove the stockings at night, but should reapply them in the morning. Do not drive or operate any equipment until instructed.  WOUND CARE:  Continue to use the PolarCare or ice packs periodically to reduce pain and swelling. You may bathe or shower after the staples are removed at the first office visit following surgery.  MEDICATIONS: You may resume your regular medications. Please take the pain medication as prescribed on the medication. Do not take pain medication on an empty stomach. You have been given a prescription for a blood thinner (Lovenox or Coumadin). Please take the medication as instructed. (NOTE: After completing a 2 week course of Lovenox, take one Enteric-coated aspirin twice a [place]. This along with elevation will help reduce the possibility of phlebitis in your operated leg.) Do not drive or drink alcoholic beverages when taking pain medications.  CALL THE OFFICE FOR: Temperature above 101 degrees Excessive bleeding or drainage on the dressing. Excessive swelling, coldness, or paleness of the toes. Persistent nausea and vomiting.  FOLLOW-UP:   You should have an appointment to return to the office in 10-14 days after surgery. Arrangements have been made for continuation of Physical Therapy (either home therapy or outpatient therapy).     [facility] Department Directory         [email or web address]       [email or web address]          Cardiology  Appointments: [place] - [phone] [place] - [phone]  Endocrinology  Appointments: [place] - [phone] [place] - [phone]  Gastroenterology  Appointments: [place] - [phone] [place] - [phone]        General Surgery   Appointments: [place] - [phone]  Internal Medicine/Family Medicine  Appointments: [place] - [phone] [place] - [phone] [place] - [phone]  Metabolic and Weigh Loss Surgery  Appointments: [place] - [phone]        Neurology  Appointments: [place] - [phone] [place] - [phone]  Neurosurgery  Appointments: [place] - [phone]  Obstetrics & Gynecology  Appointments: [place] - [phone] [place] - [phone]        Pediatrics  Appointments: [place] - [phone] [place] - [phone]  Physiatry  Appointments: [place] -[phone]  Physical Therapy  Appointments: [place] - [phone] [place] - [phone]        Podiatry  Appointments: [place] - [phone] [place] - [phone]  Pulmonology  Appointments: [place] - [phone]  Rheumatology  Appointments: [place] - [phone]         Location: [facility]  [address] , [place]  [address]  [place] Location: [facility] [address] [place], [place]  [address]  [place] Location: [facility] [address] [place], [place]  [address]    

## 2022-05-07 ENCOUNTER — Telehealth: Payer: Self-pay | Admitting: *Deleted

## 2022-05-07 NOTE — Progress Notes (Incomplete)
Perioperative Services  Pre-Admission/Anesthesia Testing Clinical Review  Date: 05/08/22  Patient Demographics:  Name: Robert Greer DOB:   August 14, 1955 MRN:   [id number]  Planned Surgical Procedure(s):    Case: [id number] Date/Time: 01/10/22 1100   Procedure: COMPUTER ASSISTED TOTAL KNEE ARTHROPLASTY (Right: Knee)   Anesthesia type: Choice   Pre-op diagnosis: PRIMARY OSTEOARTHRITIS OF RIGHT KNEE.   Location: [facility] OR ROOM 01 / [facility] ORS FOR ANESTHESIA GROUP   Surgeons: Donato Heinz, MD   NOTE: Available PAT nursing documentation and vital signs have been reviewed. Clinical nursing staff has updated patient's PMH/PSHx, current medication list, and drug allergies/intolerances to ensure comprehensive history available to assist in medical decision making as it pertains to the aforementioned surgical procedure and anticipated anesthetic course. Extensive review of available clinical information performed. [place] PMH and PSHx updated with any diagnoses/procedures that  may have been inadvertently omitted during his intake with the pre-admission testing department's nursing staff.  Clinical Discussion:  Robert Greer is a 67 y.o. male who is submitted for pre-surgical anesthesia review and clearance prior to him undergoing the above procedure. Patient has never been a smoker. Pertinent PMH includes: CAD, NSTEMI, Mobitz 2 AV block, HTN, HLD, OSAH (requires nocturnal BiPAP therapy), GERD (no daily Tx), hemochromatosis, OA, back pain, depression, anxiety.  Patient is followed by cardiology Mariah Milling, MD). He was last seen in the cardiology clinic on 12/26/2021; notes reviewed.  At the time of his clinic visit, patient doing well overall from a cardiovascular perspective. Patient denied any chest pain, shortness of breath, PND, orthopnea, palpitations, significant peripheral edema, weakness, fatigue, vertiginous symptoms, or presyncope/syncope. Patient with a past medical history significant for  cardiovascular diagnoses. Documented physical exam was grossly benign, providing no evidence of acute exacerbation and/or decompensation of the patient's known cardiovascular conditions.  Patient suffered an NSTEMI on 01/07/2015.  Troponins were trended: 0.63 --> 3.16 --> 10.08 --> 9.73 ng/L.    TTE was performed on 01/07/2018 revealing a normal left ventricular systolic function with an EF of 60 to 65%.  There were no regional wall motion abnormalities.  Right ventricular size and function normal.  There was mild mitral valve regurgitation.  All transvalvular gradients were noted to be normal with no evidence suggestive of valvular stenosis.  Study was determined to be normal.  Diagnostic LEFT heart catheterization was performed on 01/10/2015.  Revealing multivessel CAD; 100% mid RCA, 60% distal LCx, 50% ostial D1-D1, and 85% proximal to mid LAD.  PCI was subsequently performed placing overlapping 2.75 x 22 mm (proximal LAD), 2.5 x 22 mm (mid LAD), and 2.75 x 12 mm (mid LAD) Resolute Integrity DES.  Procedure yielded excellent angiographic result and TIMI-3 flow.  Exercise tolerance test performed on 02/02/2019, at which time patient was able to achieve 9.0 METS following 7 minutes and 21 seconds of exercise.  Heart rate 141 bpm, which was 90% of the MPHR.  Peak blood pressure 200/97 mmHg.  There were no significant ST or T wave abnormalities at peak stress or in recovery.  Study determined to be normal overall.  Due to previous PCI requiring placement of overlapping stents, patient remains on daily DAPT therapy (ASA + clopidogrel).  He is reported to be compliant with prescribed medications with no evidence or reports of GI bleeding.  Blood pressure reasonably controlled at 130/76 mmHg on currently prescribed ARB (valsartan) and diuretic (HCTZ) therapies.  Patient is on atorvastatin + ezetimibe for his HLD diagnosis and further ASCVD prevention.  He is  not diabetic.  Patient does have a significant OSAH  diagnosis that is successfully being treated with nocturnal BiPAP therapy; compliant with therapy.  Patient maintains an active lifestyle.  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 Functional capacity, as defined by DASI, is documented as being >/= 4 METS.  No changes were made to his medication regimen.  Patient to follow-up with outpatient cardiology in 1 year or sooner if needed.  Baiden Lynes Lascola is scheduled for an elective COMPUTER ASSISTED RIGHT TOTAL KNEE ARTHROPLASTY on 05/09/2022 with Dr. Francesco Sor, MD given patient's past medical history significant for cardiovascular diagnoses, presurgical cardiac clearance was sought by the PAT team. Per cardiology, "Mr. Robert's perioperative risk of a major cardiac event is 0.9% according to the Revised Cardiac Risk Index (RCRI).  Therefore, he is at low risk for perioperative complications.   His functional capacity is good at 6.36 METs according to the [place] Activity Status Index (DASI). According to ACC/AHA guidelines, no further cardiovascular testing needed.  The patient may proceed to surgery at ACCEPTABLE risk".  Again, this patient is on daily DAPT therapy. He has been holding his clopidogrel since 05/01/2022 per recommendations from surgery. He has continued his daily low dose ASA, with last dose being on 05/08/2022.   Patient reports previous perioperative complications with anesthesia in the past.  While unsure of the details, patient reporting that he experienced (+) cardiac arrest during a back surgery performed in [place], [place]. In review of the available records, it is noted that patient underwent a general anesthetic course here at [facility] ([facility]) in 01/2017 without documented complications.      05/04/2022    8:47 AM 03/09/2022    8:06 AM 02/15/2022    9:00 AM  Vitals with BMI  Height  6\' 0"    Weight 270 lbs 270 lbs 271 lbs  BMI  36.61   Systolic 134 116 165  Diastolic 82  76 80  Pulse 80 93 68    Providers/Specialists:   NOTE: Primary physician provider listed below. Patient may have been seen by APP or partner within same practice.   PROVIDER ROLE / SPECIALTY LAST OV  Hooten, Illene Labrador, MD Orthopedics (Surgeon) 04/30/2022  Lorre Munroe, NP Primary Care Provider 05/02/2022  Julien Nordmann, MD Cardiology 12/26/2021   Allergies:  Levaquin [levofloxacin]  Current Home Medications:   No current facility-administered medications for this encounter.    aspirin 81 MG chewable tablet   atorvastatin (LIPITOR) 20 MG tablet   buPROPion (WELLBUTRIN XL) 150 MG 24 hr tablet   celecoxib (CELEBREX) 200 MG capsule   cetirizine (ZYRTEC) 10 MG tablet   clopidogrel (PLAVIX) 75 MG tablet   colchicine 0.6 MG tablet   diclofenac Sodium (VOLTAREN) 1 % GEL   DULoxetine (CYMBALTA) 60 MG capsule   ezetimibe (ZETIA) 10 MG tablet   Multiple Vitamin (MULTIVITAMIN) capsule   oxyCODONE-acetaminophen (PERCOCET) 10-325 MG tablet   predniSONE (DELTASONE) 20 MG tablet   pregabalin (LYRICA) 75 MG capsule   triamcinolone (NASACORT) 55 MCG/ACT AERO nasal inhaler   valsartan-hydrochlorothiazide (DIOVAN-HCT) 160-12.5 MG tablet   History:   Past Medical History:  Diagnosis Date   Anxiety    Arthritis    Back pain    CAD (coronary artery disease)    a.) NSTEMI 01/07/2015; b.) LHC/PCI 01/10/2015: LM nl, LAD 85p/m (overlapping 2.75 x 22 mm (pLAD), 2.5 x 22 mm (mLAD), and 2.75 x 12 mm (mLAD)  Resolute Integrity DES), D1 50ost, D2/3 small/nl, LCX 60d, OM1 nl, OM2 min irregs, OM3 nl, RCA 150m (likely culprit w/ L->R, R->R collats-->Med Rx). Nl EF.   Complication of anesthesia 2010   a.) cardiac arrest in OR during back surgery done in [place]; "never told me why"   Depressive disorder, not elsewhere classified    GERD (gastroesophageal reflux disease)    Gout    History of echocardiogram    a. 12/2014 Echo: EF 60-65%, no rwma, mild MR. Nl RV fxn.   Hyperlipidemia    Hypertension     Lipoma of back 02/09/2015   Long term current use of antithrombotics/antiplatelets    a.) on DAPT (ASA + clopidogrel)   Measles    Mobitz (type) II atrioventricular block    Mumps    NSTEMI (non-ST elevated myocardial infarction) 01/07/2015   a.) troponins were trended: 0.63 --> 3.16 --> 10.08 --> 9.73; b.) LHC/PCI 01/10/2015 --> overlapping 2.75 x 22 mm (pLAD), 2.5 x 22 mm (mLAD), and 2.75 x 12 mm (mLAD) Resolute Integrity DES   Obesity    OSA treated with BiPAP    Other hemochromatosis 12/05/2016   Past Surgical History:  Procedure Laterality Date   ANTERIOR FUSION LUMBAR SPINE     BACK SURGERY  2012   x2   CARDIAC CATHETERIZATION N/A 01/10/2015   Procedure: Left Heart Cath and Coronary Angiography;  Surgeon: Iran Ouch, MD;  Location: [facility] INVASIVE CV LAB;  Service: Cardiovascular;  Laterality: N/A;   CARDIAC CATHETERIZATION N/A 01/10/2015   Procedure: Coronary Stent Intervention;  Surgeon: Iran Ouch, MD;  Location: [facility] INVASIVE CV LAB;  Service: Cardiovascular;  Laterality: N/A;   COLONOSCOPY  2005   COLONOSCOPY WITH PROPOFOL N/A 02/14/2017   Procedure: COLONOSCOPY WITH PROPOFOL;  Surgeon: Wyline Mood, MD;  Location: [facility] ENDOSCOPY;  Service: Gastroenterology;  Laterality: N/A;   ESOPHAGOGASTRODUODENOSCOPY (EGD) WITH PROPOFOL N/A 02/14/2017   Procedure: ESOPHAGOGASTRODUODENOSCOPY (EGD) WITH PROPOFOL;  Surgeon: Wyline Mood, MD;  Location: [facility] ENDOSCOPY;  Service: Gastroenterology;  Laterality: N/A;   KNEE SURGERY     bilateral   TONSILLECTOMY AND ADENOIDECTOMY     TOTAL SHOULDER REPLACEMENT Right    Family History  Problem Relation Age of Onset   Stroke Mother    Liver disease Father    Lung cancer Sister    Diabetes Sister    Liver disease Brother    Lung cancer Paternal Grandmother    Social History   Tobacco Use   Smoking status: Never   Smokeless tobacco: Current    Types: Snuff  Vaping Use   Vaping Use: Never used  Substance Use Topics    Alcohol use: Yes    Alcohol/week: 2.0 - 3.0 standard drinks of alcohol    Types: 2 - 3 Cans of beer per week    Comment: ocassionally   Drug use: No    Pertinent Clinical Results:  LABS: Labs reviewed: Acceptable for surgery.  No visits with results within 3 [place]azama(s) from this visit.  Latest known visit with results is:  Hospital Outpatient Visit on 05/01/2022  Component Date Value Ref Range Status   MRSA, PCR 05/01/2022 NEGATIVE  NEGATIVE Final   Staphylococcus aureus 05/01/2022 NEGATIVE  NEGATIVE Final   Comment: (NOTE) The Xpert SA Assay (FDA approved for NASAL specimens in patients 68 years of age and older), is one component of a comprehensive surveillance program. It is not intended to diagnose infection nor to guide or monitor treatment. Performed at [facility], [address] [place], [place] [address]    ABO/RH(D) 05/01/2022 A POS   Final   Antibody Screen 05/01/2022 NEG   Final   Sample Expiration 05/01/2022 05/15/2022,2359   Final   Extend sample reason 05/01/2022    Final                   Value:NO TRANSFUSIONS OR PREGNANCY IN THE PAST 3 MONTHS Performed at [facility], [address] [place], [place] [address]    WBC 05/01/2022 8.2  4.0 - 10.5 K/uL Final   RBC 05/01/2022 4.76  4.22 - 5.81 MIL/uL Final   Hemoglobin 05/01/2022 15.0  13.0 - 17.0 g/dL Final   HCT 09/81/1914 45.5  39.0 - 52.0 % Final   MCV 05/01/2022 95.6  80.0 - 100.0 fL Final   MCH 05/01/2022 31.5  26.0 - 34.0 pg Final   MCHC 05/01/2022 33.0  30.0 - 36.0 g/dL Final   RDW 78/29/5621 12.6  11.5 - 15.5 % Final   Platelets 05/01/2022 288  150 - 400 K/uL Final   nRBC 05/01/2022 0.0  0.0 - 0.2 % Final   Performed at [facility], [address] [place], [place] [address]   Sodium 05/01/2022 140  135 - 145 mmol/L Final   Potassium 05/01/2022 4.1  3.5 - 5.1 mmol/L Final   Chloride 05/01/2022 104  98 - 111 mmol/L Final   CO2 05/01/2022 28  22 - 32 mmol/L Final    Glucose, Bld 05/01/2022 107 (H)  70 - 99 mg/dL Final   Glucose reference range applies only to samples taken after fasting for at least 8 hours.   BUN 05/01/2022 23  8 - 23 mg/dL Final   Creatinine, Ser 05/01/2022 1.04  0.61 - 1.24 mg/dL Final   Calcium 78/46/9629 9.4  8.9 - 10.3 mg/dL Final   Total Protein 52/84/1324 7.8  6.5 - 8.1 g/dL Final   Albumin 40/10/2723 4.5  3.5 - 5.0 g/dL Final   AST 36/64/4034 32  15 - 41 U/L Final   ALT 05/01/2022 39  0 - 44 U/L Final   Alkaline Phosphatase 05/01/2022 71  38 - 126 U/L Final   Total Bilirubin 05/01/2022 0.7  0.3 - 1.2 mg/dL Final   GFR, Estimated 05/01/2022 >60  >60 mL/min Final   Comment: (NOTE) Calculated using the CKD-EPI Creatinine Equation (2021)    Anion gap 05/01/2022 8  5 - 15 Final   Performed at [facility], [address] [place], [place] [address]   Color, Urine 05/01/2022 YELLOW (A)  YELLOW Final   APPearance 05/01/2022 CLEAR (A)  CLEAR Final   Specific Gravity, Urine 05/01/2022 1.020  1.005 - 1.030 Final   pH 05/01/2022 5.0  5.0 - 8.0 Final   Glucose, UA 05/01/2022 NEGATIVE  NEGATIVE mg/dL Final   Hgb urine dipstick 05/01/2022 NEGATIVE  NEGATIVE Final   Bilirubin Urine 05/01/2022 NEGATIVE  NEGATIVE Final   Ketones, ur 05/01/2022 NEGATIVE  NEGATIVE mg/dL Final   Protein, ur 56/38/7564 NEGATIVE  NEGATIVE mg/dL Final   Nitrite 33/29/5188 NEGATIVE  NEGATIVE Final   Leukocytes,Ua 05/01/2022 NEGATIVE  NEGATIVE Final   Performed at [facility], [address] [place], [place] [address]   CRP 05/01/2022 0.8  <1.0 mg/dL Final   Performed at [facility], [address] [place], [place] [address]   Sed Rate 05/01/2022 6  0 - 20 mm/hr Final   Performed at [facility], [address] [place], [place] [address]    ECG: Date: 12/26/2021 Time ECG obtained: 1509 PM Rate: 71 bpm Rhythm: normal sinus Axis (leads I and aVF): Normal Intervals: PR 162 ms. QRS 90 ms. QTc 443 ms. ST segment and  T wave changes: No evidence of acute ST segment elevation or depression Comparison: Similar to previous tracing obtained on 11/28/2020   IMAGING / PROCEDURES: DIAGNOSTIC RADIOGRAPHS OF RIGHT KNEE 3 VIEWS performed on 04/30/2022 Medial compartment complete collapse with bone-on-bone pathology.   There is medial osteophyte formation.   Patellofemoral joint shows lateral positioning with osteophyte formation in the left knee reveals the tibial plateau ORIF with medial compartment mild narrowing.     DIAGNOSTIC RADIOGRAPHS OF CHEST 2 VIEWS performed on 02/01/2022 Mild left hemidiaphragm elevation with increased left basilar linear opacities. No focal consolidation. No pleural effusion or pneumothorax.  Cardiac silhouette is normal in size.  Thoracic aorta is tortuous.  Left shoulder arthroplasty new from the prior study.  Degenerative changes of the spine.   EXERCISE TOLERANCE TEST performed on 02/02/2019 Resting EKG normal sinus rhythm rate 71 bpm no significant ST-T wave changes  Target heart rate achieved, 141 bpm 90% of maximum predicted heart rate  No significant ST or T wave abnormality at peak stress or in recovery  Normal exercise treadmill study Target heart rate achieved  LEFT HEART CATHETERIZATION AND CORONARY ANGIOGRAPHY performed on 01/10/2015 Significant three-vessel coronary artery disease. Occluded mid right coronary artery is likely the culprit for myocardial infarction. However, there are readily right to right collaterals and left-to-right collaterals. There is a 60% distal left circumflex stenosis and an 85% proximal to mid heavily calcified LAD disease. Low normal LV systolic function with mildly elevated left ventricular end-diastolic pressure. Successful angioplasty and 3 overlapped drug-eluting stent placement to the proximal to mid LAD. Sluggish flow in the jailed first diagonal which improved with nitroglycerin.  Recommendations: It appears that the RCA occlusion is  more than 53 days old and already has some faint collaterals. Thus, no PCI was performed. The distal vessel also appears to be diffusely diseased and does not seem to be a good target for intervention. I decided to intervene in the proximal to mid LAD which was heavily calcified. This was a difficult procedure. The distal left circumflex disease can be left to be treated medically. Continue indefinite dual antiplatelet therapy if possible. Aggressive treatment of risk factors.    TRANSTHORACIC ECHOCARDIOGRAM performed on 01/08/2015 Normal left ventricular systolic function with concentric LVH; EF 60-65% Normal left ventricular diastolic Doppler parameters Normal right ventricular systolic function Mild MR Normal transvalvular gradients; no valvular stenosis No pericardial effusion  Impression and Plan:  Robert Greer has been referred for pre-anesthesia review and clearance prior to him undergoing the planned anesthetic and procedural courses. Available labs, pertinent testing, and imaging results were personally reviewed by me in preparation for upcoming operative/procedural course. [facility] medical record has been updated following extensive record review and patient interview with PAT staff.   This patient has been appropriately cleared by cardiology with an overall ACCEPTABLE risk of significant perioperative cardiovascular complications. Based on clinical review performed today (05/08/22), barring any significant acute changes in the patient's overall condition, it is anticipated that he will be able to proceed with the planned surgical intervention. Any acute changes in clinical condition may necessitate his procedure being postponed and/or cancelled. Patient will meet with anesthesia team (MD and/or CRNA) on the [place] of his procedure for preoperative evaluation/assessment. Questions regarding  anesthetic course will be fielded at that time.   Pre-surgical instructions were reviewed with the  patient during his PAT appointment, and questions were fielded to satisfaction by PAT clinical staff. He has been instructed on which medications that he will need to hold prior to surgery, as well as the ones that have been deemed safe/appropriate to take of the [place] of his procedure. As part of the general education provided by PAT, patient made aware both verbally and in writing, that he would need to abstain from the use of any illegal substances during his perioperative course. He was advised that failure to follow the provided instructions could necessitate case cancellation or result serious perioperative complications up to and including death. Patient encouraged to contact PAT and/or his surgeon's office to discuss any questions or concerns that may arise prior to surgery; verbalized understanding.   Quentin Mulling, MSN, APRN, FNP-C, CEN [facility]  Peri-operative Services Nurse Practitioner Phone: [phone] Fax: [phone] 05/08/22 3:11 PM  NOTE: This note has been prepared using Dragon dictation software. Despite my best ability to proofread, there is always the potential that unintentional transcriptional errors may still occur from this process.

## 2022-05-07 NOTE — Telephone Encounter (Signed)
-----   Message from Verlee Monte, NP sent at 05/07/2022 11:47 AM EDT ----- Regarding: Request for pre-operative cardiac clearance Request for pre-operative cardiac clearance:  1. What type of surgery is being performed?  COMPUTER ASSISTED TOTAL KNEE ARTHROPLASTY  2. When is this surgery scheduled?  05/09/2022. Just cleared for surgery in 12/2021, however surgery was postponed. Ended up having another surgery for triceps rupture at [facility] in 01/2022. Just wanting to ensure that clearance remained in effect and that we were cleared to proceed with knee arthroplasty  3. Type of clearance being requested (medical, pharmacy, both)? BOTH   4. Are there any medications that need to be held prior to surgery? ASA + CLOPIDOGREL (has been off clopidogrel since 05/01/2022)  5. Practice name and name of physician performing surgery?  Performing surgeon: Dr. Francesco Sor, MD Requesting clearance: Quentin Mulling, FNP-C    6. Anesthesia type (none, local, MAC, general)? GENERAL  7. What is the office phone and fax number?   Phone: [phone] Fax: [phone]  ATTENTION: Unable to create telephone message as per your standard workflow. Directed by [facility] providers to send requests for cardiac clearance to this pool for appropriate distribution to provider covering pre-operative clearances.   Quentin Mulling, MSN, APRN, FNP-C, CEN [facility]  Peri-operative Services Nurse Practitioner Phone: [phone] 05/07/22 11:47 AM

## 2022-05-07 NOTE — Telephone Encounter (Signed)
   Name: Robert Greer  DOB: [birth year]  MRN: [id number]  Primary Cardiologist: Julien Nordmann, MD  Chart reviewed as part of pre-operative protocol coverage. Because of Robert Greer's past medical history and time since last visit, he will require a follow-up telephone visit in order to better assess preoperative cardiovascular risk.  Pre-op covering staff: - Please schedule appointment and call patient to inform them. If patient already had an upcoming appointment within acceptable timeframe, please add "pre-op clearance" to the appointment notes so provider is aware. - Please contact requesting surgeon's office via preferred method (i.e, phone, fax) to inform them of need for appointment prior to surgery.  He has already been taken off plavix since surgery is 4/17.  Sharlene Dory, PA-C  05/07/2022, 12:21 PM

## 2022-05-07 NOTE — Telephone Encounter (Signed)
1st attempt to reach pt regarding surgical clearance and the need for a tele visit.  Left a message to call us back and ask for the preop team.  Will need to be added to tomorrow at 3:20.

## 2022-05-07 NOTE — Telephone Encounter (Signed)
1. What type of surgery is being performed?  COMPUTER ASSISTED TOTAL KNEE ARTHROPLASTY   2. When is this surgery scheduled?  05/09/2022. Just cleared for surgery in 12/2021, however surgery was postponed. Ended up having another surgery for triceps rupture at [facility] in 01/2022. Just wanting to ensure that clearance remained in effect and that we were cleared to proceed with knee arthroplasty    3. Type of clearance being requested (medical, pharmacy, both)?  BOTH    4. Are there any medications that need to be held prior to surgery?  ASA + CLOPIDOGREL (has been off clopidogrel since 05/01/2022)   5. Practice name and name of physician performing surgery?  Performing surgeon: Dr. Francesco Sor, MD  Requesting clearance: Robert Mulling, FNP-C     6. Anesthesia type (none, local, MAC, general)?  GENERAL   7. What is the office phone and fax number?   Phone: [phone]  Fax: [phone]

## 2022-05-08 ENCOUNTER — Telehealth: Payer: Self-pay | Admitting: *Deleted

## 2022-05-08 ENCOUNTER — Telehealth: Payer: Medicare Other

## 2022-05-08 ENCOUNTER — Ambulatory Visit: Payer: Medicare Other | Attending: Physician Assistant

## 2022-05-08 DIAGNOSIS — Z01810 Encounter for preprocedural cardiovascular examination: Secondary | ICD-10-CM

## 2022-05-08 NOTE — Telephone Encounter (Signed)
Pt has been added on today at 1:40 for pre op tele appt. Surgery is planned for tomorrow 05/09/22, though we only received yesterday 05/07/22. Pt states he has been holding Plavix as of 05/01/22, pt states he has been taking ASA still though. I advised the pt to not take any ASA until after his surgery. Surgeon will let the pt know when safe to resume both ASA and Plavix. I will update the surgeon's office today to let the know we have a tele appt 1:40 today. Med rec and consent are done.     Patient Consent for Virtual Visit        Robert Greer has provided verbal consent on 05/08/2022 for a virtual visit (video or telephone).   CONSENT FOR VIRTUAL VISIT FOR:  Robert Greer  By participating in this virtual visit I agree to the following:  I hereby voluntarily request, consent and authorize [facility] and its employed or contracted physicians, physician assistants, nurse practitioners or other licensed health care professionals (the Practitioner), to provide me with telemedicine health care services (the "Services") as deemed necessary by the treating Practitioner. I acknowledge and consent to receive the Services by the Practitioner via telemedicine. I understand that the telemedicine visit will involve communicating with the Practitioner through live audiovisual communication technology and the disclosure of certain medical information by electronic transmission. I acknowledge that I have been given the opportunity to request an in-person assessment or other available alternative prior to the telemedicine visit and am voluntarily participating in the telemedicine visit.  I understand that I have the right to withhold or withdraw my consent to the use of telemedicine in the course of my care at any time, without affecting my right to future care or treatment, and that the Practitioner or I may terminate the telemedicine visit at any time. I understand that I have the right to inspect all  information obtained and/or recorded in the course of the telemedicine visit and may receive copies of available information for a reasonable fee.  I understand that some of the potential risks of receiving the Services via telemedicine include:  Delay or interruption in medical evaluation due to technological equipment failure or disruption; Information transmitted may not be sufficient (e.g. poor resolution of images) to allow for appropriate medical decision making by the Practitioner; and/or  In rare instances, security protocols could fail, causing a breach of personal health information.  Furthermore, I acknowledge that it is my responsibility to provide information about my medical history, conditions and care that is complete and accurate to the best of my ability. I acknowledge that Practitioner's advice, recommendations, and/or decision may be based on factors not within their control, such as incomplete or inaccurate data provided by me or distortions of diagnostic images or specimens that may result from electronic transmissions. I understand that the practice of medicine is not an exact science and that Practitioner makes no warranties or guarantees regarding treatment outcomes. I acknowledge that a copy of this consent can be made available to me via my patient portal [facility] MyChart), or I can request a printed copy by calling the office of [facility].    I understand that my insurance will be billed for this visit.   I have read or had this consent read to me. I understand the contents of this consent, which adequately explains the benefits and risks of the Services being provided via telemedicine.  I have been provided ample opportunity  to ask questions regarding this consent and the Services and have had my questions answered to my satisfaction. I give my informed consent for the services to be provided through the use of telemedicine in my medical care

## 2022-05-08 NOTE — Progress Notes (Signed)
Virtual Visit via Telephone Note   Because of Robert Greer's co-morbid illnesses, he is at least at moderate risk for complications without adequate follow up.  This format is felt to be most appropriate for this patient at this time.  The patient did not have access to video technology/had technical difficulties with video requiring transitioning to audio format only (telephone).  All issues noted in this document were discussed and addressed.  No physical exam could be performed with this format.  Please refer to the patient's chart for his consent to telehealth for [facility].  Evaluation Performed:  Preoperative cardiovascular risk assessment _____________   Date:  05/08/2022   Patient ID:  Robert Greer, DOB Dec 22, 1955, MRN [id number] Patient Location:  Home Provider location:   Office  Primary Care Provider:  Lorre Munroe, NP Primary Cardiologist:  Julien Nordmann, MD  Chief Complaint / Patient Profile   67 y.o. y/o male with a h/o coronary artery disease, stent placement to proximal LAD and occluded RCA in the mid region 12/2014, hypertension, hyperlipidemia, sleep apnea, depressive disorder, anxiety who is pending computer assisted total knee arthoplasty and presents today for telephonic preoperative cardiovascular risk assessment.  History of Present Illness    Robert Greer is a 67 y.o. male who presents via audio/video conferencing for a telehealth visit today.  Pt was last seen in cardiology clinic on 12/26/21 by Dr. Mariah Milling.  At that time Sricharan Lacomb Granja was doing well.  The patient is now pending procedure as outlined above. Since his last visit, he states that he does not have any chest pain or shortness of breath.  He is able to get around great other than having some knee pain from time to time.  He has scored a 6.36 on the DASI.  This exceeds the 4 METS minimum requirement.  He is okay to hold his aspirin x 7 days and his Plavix x 5 days prior to his surgery.  Please  resume when medically safe to do so.  He is already off these medications since his surgery is tomorrow.   Past Medical History    Past Medical History:  Diagnosis Date   Anxiety    Arthritis    Back pain    CAD (coronary artery disease)    a.) NSTEMI 01/07/2015; b.) LHC/PCI 01/10/2015: LM nl, LAD 85p/m (overlapping 2.75 x 22 mm (pLAD), 2.5 x 22 mm (mLAD), and 2.75 x 12 mm (mLAD) Resolute Integrity DES), D1 50ost, D2/3 small/nl, LCX 60d, OM1 nl, OM2 min irregs, OM3 nl, RCA 1106m (likely culprit w/ L->R, R->R collats-->Med Rx). Nl EF.   Complication of anesthesia 2010   a.) cardiac arrest in OR during back surgery done in [place]; "never told me why"   Depressive disorder, not elsewhere classified    GERD (gastroesophageal reflux disease)    Gout    History of echocardiogram    a. 12/2014 Echo: EF 60-65%, no rwma, mild MR. Nl RV fxn.   Hyperlipidemia    Hypertension    Lipoma of back 02/09/2015   Long term current use of antithrombotics/antiplatelets    a.) on DAPT (ASA + clopidogrel)   Measles    Mobitz (type) II atrioventricular block    Mumps    NSTEMI (non-ST elevated myocardial infarction) 01/07/2015   a.) troponins were trended: 0.63 --> 3.16 --> 10.08 --> 9.73; b.) LHC/PCI 01/10/2015 --> overlapping 2.75 x 22 mm (pLAD), 2.5 x 22 mm (mLAD), and 2.75 x 12  mm (mLAD) Resolute Integrity DES   Obesity    OSA treated with BiPAP    Other hemochromatosis 12/05/2016   Past Surgical History:  Procedure Laterality Date   ANTERIOR FUSION LUMBAR SPINE     BACK SURGERY  2012   x2   CARDIAC CATHETERIZATION N/A 01/10/2015   Procedure: Left Heart Cath and Coronary Angiography;  Surgeon: Iran Ouch, MD;  Location: [facility] INVASIVE CV LAB;  Service: Cardiovascular;  Laterality: N/A;   CARDIAC CATHETERIZATION N/A 01/10/2015   Procedure: Coronary Stent Intervention;  Surgeon: Iran Ouch, MD;  Location: [facility] INVASIVE CV LAB;  Service: Cardiovascular;  Laterality: N/A;   COLONOSCOPY   2005   COLONOSCOPY WITH PROPOFOL N/A 02/14/2017   Procedure: COLONOSCOPY WITH PROPOFOL;  Surgeon: Wyline Mood, MD;  Location: [facility] ENDOSCOPY;  Service: Gastroenterology;  Laterality: N/A;   ESOPHAGOGASTRODUODENOSCOPY (EGD) WITH PROPOFOL N/A 02/14/2017   Procedure: ESOPHAGOGASTRODUODENOSCOPY (EGD) WITH PROPOFOL;  Surgeon: Wyline Mood, MD;  Location: [facility] ENDOSCOPY;  Service: Gastroenterology;  Laterality: N/A;   KNEE SURGERY     bilateral   TONSILLECTOMY AND ADENOIDECTOMY     TOTAL SHOULDER REPLACEMENT Right     Allergies  Allergies  Allergen Reactions   Levaquin [Levofloxacin] Rash    Home Medications    Prior to Admission medications   Medication Sig Start Date End Date Taking? Authorizing Provider  aspirin 81 MG chewable tablet Chew 1 tablet (81 mg total) by mouth daily. 01/11/15   Gale Journey, MD  atorvastatin (LIPITOR) 20 MG tablet Take 1 tablet (20 mg total) by mouth daily. 03/05/22   Antonieta Iba, MD  buPROPion (WELLBUTRIN XL) 150 MG 24 hr tablet Take 1 tablet (150 mg total) by mouth daily. 02/15/22   Lorre Munroe, NP  celecoxib (CELEBREX) 200 MG capsule Take 200 mg by mouth daily.    [provider]  cetirizine (ZYRTEC) 10 MG tablet Take 1 tablet (10 mg total) by mouth as needed for allergies. Reported on 02/09/2015 02/09/20   Smitty Cords, DO  clopidogrel (PLAVIX) 75 MG tablet Take 1 tablet (75 mg total) by mouth daily. 01/11/22   Antonieta Iba, MD  colchicine 0.6 MG tablet Take 2 tabs PO at the first sign of a gout attack followed by 1 tab PO 1 hour later. 09/26/21   Lorre Munroe, NP  diclofenac Sodium (VOLTAREN) 1 % GEL Apply 2 g topically 4 (four) times daily. 05/05/21   Lorre Munroe, NP  DULoxetine (CYMBALTA) 60 MG capsule Take 1 capsule (60 mg total) by mouth 2 (two) times daily. Patient taking differently: Take 60 mg by mouth every morning. 08/30/20   Karamalegos, Netta Neat, DO  ezetimibe (ZETIA) 10 MG tablet Take 1 tablet (10 mg  total) by mouth daily. 01/19/22   Antonieta Iba, MD  Multiple Vitamin (MULTIVITAMIN) capsule Take 1 capsule by mouth daily.    [provider]  oxyCODONE-acetaminophen (PERCOCET) 10-325 MG tablet Take 1 tablet by mouth 2 (two) times daily.    [provider]  predniSONE (DELTASONE) 20 MG tablet Take 1 tablet (20 mg total) by mouth daily with breakfast. 05/04/22   Lorre Munroe, NP  pregabalin (LYRICA) 75 MG capsule Take 75 mg by mouth 2 (two) times daily. 1 tab in the morning and 2 at bedtime 06/22/21 12/22/22  [provider]  triamcinolone (NASACORT) 55 MCG/ACT AERO nasal inhaler Place 2 sprays into the nose daily. Patient taking differently: Place 2 sprays into the nose as  needed. 03/24/18   Galen Manila, NP  valsartan-hydrochlorothiazide (DIOVAN-HCT) 160-12.5 MG tablet Take 1 tablet by mouth daily. Patient taking differently: Take 1 tablet by mouth every morning. 11/06/21   Antonieta Iba, MD    Physical Exam    Vital Signs:  Robert Phenes Leppert does not have vital signs available for review today.  Given telephonic nature of communication, physical exam is limited. AAOx3. NAD. Normal affect.  Speech and respirations are unlabored.  Accessory Clinical Findings    None  Assessment & Plan    1.  Preoperative Cardiovascular Risk Assessment:  Mr. Folks perioperative risk of a major cardiac event is 0.9% according to the Revised Cardiac Risk Index (RCRI).  Therefore, he is at low risk for perioperative complications.   His functional capacity is good at 6.36 METs according to the [facility] Activity Status Index (DASI). Recommendations: According to ACC/AHA guidelines, no further cardiovascular testing needed.  The patient may proceed to surgery at acceptable risk.   Antiplatelet and/or Anticoagulation Recommendations: Aspirin can be held for 7 days prior to his surgery.  Please resume Aspirin post operatively when it is felt to be safe from a bleeding  standpoint.  Clopidogrel (Plavix) can be held for 5 days prior to his surgery and resumed as soon as possible post op.   A copy of this note will be routed to requesting surgeon.  Time:   Today, I have spent 10 minutes with the patient with telehealth technology discussing medical history, symptoms, and management plan.     Sharlene Dory, PA-C  05/08/2022, 1:44 PM

## 2022-05-08 NOTE — Telephone Encounter (Signed)
Pt has been added on today at 1:40 for pre op tele appt. Surgery is planned for tomorrow 05/09/22, though we only received yesterday 05/07/22. Pt states he has been holding Plavix as of 05/01/22, pt states he has been taking ASA still though. I advised the pt to not take any ASA until after his surgery. Surgeon will let the pt know when safe to resume both ASA and Plavix. I will update the surgeon's office today to let the know we have a tele appt 1:40 today. Med rec and consent are done.

## 2022-05-08 NOTE — Anesthesia Preprocedure Evaluation (Signed)
Anesthesia Evaluation  Patient identified by MRN, date of birth, ID band Patient awake    Reviewed: Allergy & Precautions, H&P , NPO status , Patient's Chart, lab work & pertinent test results  History of Anesthesia Complications (+) history of anesthetic complications (cardiac arrest in OR during back surgery done in [place]; "never told me why)  Airway Mallampati: II  TM Distance: >3 FB Neck ROM: full    Dental  (+) Chipped   Pulmonary sleep apnea (BIPAP)    Pulmonary exam normal        Cardiovascular hypertension, Pt. on medications + CAD and + Past MI (2016)  Normal cardiovascular exam+ dysrhythmias (Mobitz (type) II atrioventricular block)   2016: b.) LHC/PCI 01/10/2015: LM nl, LAD 85p/m (overlapping 2.75 x 22 mm (pLAD), 2.5 x 22 mm (mLAD), and 2.75 x 12 mm (mLAD) Resolute Integrity DES), D1 50ost, D2/3 small/nl, LCX 60d, OM1 nl, OM2 min irregs, OM3 nl, RCA 141m (likely culprit w/ L->R, R->R collats-->Med Rx). Nl EF.   Neuro/Psych  PSYCHIATRIC DISORDERS Anxiety Depression    S/p ANTERIOR FUSION LUMBAR SPINE    GI/Hepatic Neg liver ROS,GERD  ,,  Endo/Other  negative endocrine ROS    Renal/GU      Musculoskeletal   Abdominal  (+) + obese  Peds  Hematology negative hematology ROS (+)   Anesthesia Other Findings   Past Medical History: No date: Anxiety No date: Arthritis No date: Back pain No date: CAD (coronary artery disease)     Comment:  a.) NSTEMI 01/07/2015; b.) LHC/PCI 01/10/2015: LM nl,               LAD 85p/m (overlapping 2.75 x 22 mm (pLAD), 2.5 x 22 mm               (mLAD), and 2.75 x 12 mm (mLAD) Resolute Integrity DES),               D1 50ost, D2/3 small/nl, LCX 60d, OM1 nl, OM2 min irregs,              OM3 nl, RCA 133m (likely culprit w/ L->R, R->R               collats-->Med Rx). Nl EF. 2010: Complication of anesthesia     Comment:  a.) cardiac arrest in OR during back surgery done in                [place]; "never told me why" No date: Depressive disorder, not elsewhere classified No date: GERD (gastroesophageal reflux disease) No date: Gout No date: History of echocardiogram     Comment:  a. 12/2014 Echo: EF 60-65%, no rwma, mild MR. Nl RV fxn. No date: Hyperlipidemia No date: Hypertension 02/09/2015: Lipoma of back No date: Long term current use of antithrombotics/antiplatelets     Comment:  a.) on DAPT (ASA + clopidogrel) No date: Measles No date: Mobitz (type) II atrioventricular block No date: Mumps 01/07/2015: NSTEMI (non-ST elevated myocardial infarction)     Comment:  a.) troponins were trended: 0.63 --> 3.16 --> 10.08 -->               9.73; b.) LHC/PCI 01/10/2015 --> overlapping 2.75 x 22 mm              (pLAD), 2.5 x 22 mm (mLAD), and 2.75 x 12 mm (mLAD)               Resolute Integrity DES No date: Obesity No date: OSA treated  with BiPAP 12/05/2016: Other hemochromatosis  Past Surgical History: No date: ANTERIOR FUSION LUMBAR SPINE 2012: BACK SURGERY     Comment:  x2 01/10/2015: CARDIAC CATHETERIZATION; N/A     Comment:  Procedure: Left Heart Cath and Coronary Angiography;                Surgeon: Iran Ouch, MD;  Location: [facility] INVASIVE               CV LAB;  Service: Cardiovascular;  Laterality: N/A; 01/10/2015: CARDIAC CATHETERIZATION; N/A     Comment:  Procedure: Coronary Stent Intervention;  Surgeon:               Iran Ouch, MD;  Location: [facility] INVASIVE CV LAB;                Service: Cardiovascular;  Laterality: N/A; 2005: COLONOSCOPY 02/14/2017: COLONOSCOPY WITH PROPOFOL; N/A     Comment:  Procedure: COLONOSCOPY WITH PROPOFOL;  Surgeon: Wyline Mood, MD;  Location: [facility] ENDOSCOPY;  Service:               Gastroenterology;  Laterality: N/A; 02/14/2017: ESOPHAGOGASTRODUODENOSCOPY (EGD) WITH PROPOFOL; N/A     Comment:  Procedure: ESOPHAGOGASTRODUODENOSCOPY (EGD) WITH               PROPOFOL;  Surgeon: Wyline Mood, MD;  Location:  [facility]               ENDOSCOPY;  Service: Gastroenterology;  Laterality: N/A; No date: KNEE SURGERY     Comment:  bilateral No date: TONSILLECTOMY AND ADENOIDECTOMY No date: TOTAL SHOULDER REPLACEMENT; Right     Reproductive/Obstetrics negative OB ROS                             Anesthesia Physical Anesthesia Plan  ASA: 3  Anesthesia Plan: Spinal   Post-op Pain Management:    Induction:   PONV Risk Score and Plan: Propofol infusion  Airway Management Planned:   Additional Equipment:   Intra-op Plan:   Post-operative Plan:   Informed Consent:      Dental Advisory Given  Plan Discussed with: CRNA and Surgeon  Anesthesia Plan Comments:         Anesthesia Quick Evaluation

## 2022-05-09 ENCOUNTER — Encounter: Payer: Self-pay | Admitting: Orthopedic Surgery

## 2022-05-09 ENCOUNTER — Observation Stay
Admission: RE | Admit: 2022-05-09 | Discharge: 2022-05-10 | Disposition: A | Discharge status: Home Health | Payer: Medicare Other | Source: Ambulatory Visit | Attending: Orthopedic Surgery | Admitting: Orthopedic Surgery

## 2022-05-09 ENCOUNTER — Other Ambulatory Visit: Payer: Self-pay

## 2022-05-09 ENCOUNTER — Encounter
Admission: RE | Disposition: A | Discharge status: Home Health | Payer: Self-pay | Source: Ambulatory Visit | Attending: Orthopedic Surgery

## 2022-05-09 ENCOUNTER — Observation Stay: Payer: Medicare Other

## 2022-05-09 ENCOUNTER — Ambulatory Visit: Payer: Medicare Other | Admitting: Urgent Care

## 2022-05-09 DIAGNOSIS — I25118 Atherosclerotic heart disease of native coronary artery with other forms of angina pectoris: Secondary | ICD-10-CM

## 2022-05-09 DIAGNOSIS — Z96651 Presence of right artificial knee joint: Secondary | ICD-10-CM | POA: Diagnosis not present

## 2022-05-09 DIAGNOSIS — I10 Essential (primary) hypertension: Secondary | ICD-10-CM | POA: Insufficient documentation

## 2022-05-09 DIAGNOSIS — M1711 Unilateral primary osteoarthritis, right knee: Secondary | ICD-10-CM | POA: Diagnosis not present

## 2022-05-09 DIAGNOSIS — Z79899 Other long term (current) drug therapy: Secondary | ICD-10-CM | POA: Insufficient documentation

## 2022-05-09 DIAGNOSIS — Z96659 Presence of unspecified artificial knee joint: Secondary | ICD-10-CM

## 2022-05-09 DIAGNOSIS — Z7902 Long term (current) use of antithrombotics/antiplatelets: Secondary | ICD-10-CM | POA: Insufficient documentation

## 2022-05-09 DIAGNOSIS — Z955 Presence of coronary angioplasty implant and graft: Secondary | ICD-10-CM | POA: Insufficient documentation

## 2022-05-09 DIAGNOSIS — I2510 Atherosclerotic heart disease of native coronary artery without angina pectoris: Secondary | ICD-10-CM | POA: Insufficient documentation

## 2022-05-09 DIAGNOSIS — F17290 Nicotine dependence, other tobacco product, uncomplicated: Secondary | ICD-10-CM | POA: Diagnosis not present

## 2022-05-09 DIAGNOSIS — Z96611 Presence of right artificial shoulder joint: Secondary | ICD-10-CM | POA: Insufficient documentation

## 2022-05-09 DIAGNOSIS — Z471 Aftercare following joint replacement surgery: Secondary | ICD-10-CM | POA: Diagnosis not present

## 2022-05-09 DIAGNOSIS — Z7982 Long term (current) use of aspirin: Secondary | ICD-10-CM | POA: Diagnosis not present

## 2022-05-09 DIAGNOSIS — I252 Old myocardial infarction: Secondary | ICD-10-CM

## 2022-05-09 HISTORY — PX: KNEE ARTHROPLASTY: SHX992

## 2022-05-09 SURGERY — ARTHROPLASTY, KNEE, TOTAL, USING IMAGELESS COMPUTER-ASSISTED NAVIGATION
Anesthesia: Spinal | Site: Knee | Laterality: Right

## 2022-05-09 MED ORDER — FLEET ENEMA 7-19 GM/118ML RE ENEM: 1.0000 | ENEMA | Freq: Once | RECTAL | Status: DC | PRN

## 2022-05-09 MED ORDER — HYDROMORPHONE HCL 1 MG/ML IJ SOLN
0.5000 mg | INTRAMUSCULAR | Status: DC | PRN
  Administered 2022-05-09 (×2): 0.5 mg via INTRAVENOUS

## 2022-05-09 MED ORDER — PANTOPRAZOLE SODIUM 40 MG PO TBEC
DELAYED_RELEASE_TABLET | ORAL | Status: AC
  Filled 2022-05-09: qty 1

## 2022-05-09 MED ORDER — TRANEXAMIC ACID-NACL 1000-0.7 MG/100ML-% IV SOLN
INTRAVENOUS | Status: AC
  Filled 2022-05-09: qty 100

## 2022-05-09 MED ORDER — SURGIPHOR WOUND IRRIGATION SYSTEM - OPTIME
TOPICAL | Status: DC | PRN
  Administered 2022-05-09: 450 mL via TOPICAL

## 2022-05-09 MED ORDER — TRANEXAMIC ACID-NACL 1000-0.7 MG/100ML-% IV SOLN
1000.0000 mg | INTRAVENOUS | Status: AC
  Administered 2022-05-09: 1000 mg via INTRAVENOUS

## 2022-05-09 MED ORDER — CEFAZOLIN SODIUM-DEXTROSE 2-4 GM/100ML-% IV SOLN
INTRAVENOUS | Status: AC
  Filled 2022-05-09: qty 100

## 2022-05-09 MED ORDER — SUGAMMADEX SODIUM 200 MG/2ML IV SOLN
INTRAVENOUS | Status: DC | PRN
  Administered 2022-05-09: 100 mg via INTRAVENOUS

## 2022-05-09 MED ORDER — TRANEXAMIC ACID-NACL 1000-0.7 MG/100ML-% IV SOLN
1000.0000 mg | Freq: Once | INTRAVENOUS | Status: AC
  Administered 2022-05-09: 1000 mg via INTRAVENOUS

## 2022-05-09 MED ORDER — METOCLOPRAMIDE HCL 10 MG PO TABS
10.0000 mg | ORAL_TABLET | Freq: Three times a day (TID) | ORAL | Status: DC
  Administered 2022-05-09 – 2022-05-10 (×3): 10 mg via ORAL

## 2022-05-09 MED ORDER — SENNOSIDES-DOCUSATE SODIUM 8.6-50 MG PO TABS
ORAL_TABLET | ORAL | Status: AC
  Filled 2022-05-09: qty 1

## 2022-05-09 MED ORDER — MAGNESIUM HYDROXIDE 400 MG/5ML PO SUSP
ORAL | Status: AC
  Filled 2022-05-09: qty 30

## 2022-05-09 MED ORDER — FERROUS SULFATE 325 (65 FE) MG PO TABS
ORAL_TABLET | ORAL | Status: AC
  Filled 2022-05-09: qty 1

## 2022-05-09 MED ORDER — ACETAMINOPHEN 10 MG/ML IV SOLN
INTRAVENOUS | Status: DC | PRN
  Administered 2022-05-09: 1000 mg via INTRAVENOUS

## 2022-05-09 MED ORDER — HYDROCHLOROTHIAZIDE 12.5 MG PO TABS
12.5000 mg | ORAL_TABLET | Freq: Every day | ORAL | Status: DC
  Administered 2022-05-10: 12.5 mg via ORAL
  Filled 2022-05-09: qty 1

## 2022-05-09 MED ORDER — GABAPENTIN 300 MG PO CAPS
300.0000 mg | ORAL_CAPSULE | Freq: Once | ORAL | Status: AC
  Administered 2022-05-09: 300 mg via ORAL

## 2022-05-09 MED ORDER — DROPERIDOL 2.5 MG/ML IJ SOLN: 0.6250 mg | Freq: Once | INTRAMUSCULAR | Status: DC | PRN

## 2022-05-09 MED ORDER — PREGABALIN 50 MG PO CAPS
ORAL_CAPSULE | ORAL | Status: AC
  Filled 2022-05-09: qty 3

## 2022-05-09 MED ORDER — OXYCODONE HCL 5 MG PO TABS
5.0000 mg | ORAL_TABLET | ORAL | Status: DC | PRN
  Administered 2022-05-09: 5 mg via ORAL

## 2022-05-09 MED ORDER — OXYCODONE HCL 5 MG PO TABS
ORAL_TABLET | ORAL | Status: AC
  Filled 2022-05-09: qty 1

## 2022-05-09 MED ORDER — GABAPENTIN 300 MG PO CAPS
ORAL_CAPSULE | ORAL | Status: AC
  Filled 2022-05-09: qty 1

## 2022-05-09 MED ORDER — SODIUM CHLORIDE (PF) 0.9 % IJ SOLN
INTRAMUSCULAR | Status: DC | PRN
  Administered 2022-05-09: 120 mL via PERIARTICULAR

## 2022-05-09 MED ORDER — OXYCODONE HCL 5 MG PO TABS
5.0000 mg | ORAL_TABLET | Freq: Once | ORAL | Status: AC | PRN
  Administered 2022-05-09: 5 mg via ORAL

## 2022-05-09 MED ORDER — EZETIMIBE 10 MG PO TABS
10.0000 mg | ORAL_TABLET | Freq: Every day | ORAL | Status: DC
  Administered 2022-05-09 – 2022-05-10 (×2): 10 mg via ORAL

## 2022-05-09 MED ORDER — ENSURE PRE-SURGERY PO LIQD
296.0000 mL | Freq: Once | ORAL | Status: AC
  Administered 2022-05-09: 296 mL via ORAL
  Filled 2022-05-09: qty 296

## 2022-05-09 MED ORDER — FERROUS SULFATE 325 (65 FE) MG PO TABS
325.0000 mg | ORAL_TABLET | Freq: Two times a day (BID) | ORAL | Status: DC
  Administered 2022-05-09 – 2022-05-10 (×2): 325 mg via ORAL

## 2022-05-09 MED ORDER — SENNA 8.6 MG PO TABS
ORAL_TABLET | ORAL | Status: AC
  Filled 2022-05-09: qty 1

## 2022-05-09 MED ORDER — PHENOL 1.4 % MT LIQD: 1.0000 | OROMUCOSAL | Status: DC | PRN

## 2022-05-09 MED ORDER — LACTATED RINGERS IV SOLN: INTRAVENOUS | Status: DC

## 2022-05-09 MED ORDER — FENTANYL CITRATE (PF) 100 MCG/2ML IJ SOLN
INTRAMUSCULAR | Status: AC
  Filled 2022-05-09: qty 2

## 2022-05-09 MED ORDER — ORAL CARE MOUTH RINSE: 15.0000 mL | Freq: Once | OROMUCOSAL | Status: AC

## 2022-05-09 MED ORDER — METOCLOPRAMIDE HCL 10 MG PO TABS
ORAL_TABLET | ORAL | Status: AC
  Filled 2022-05-09: qty 1

## 2022-05-09 MED ORDER — PROPOFOL 10 MG/ML IV BOLUS
INTRAVENOUS | Status: DC | PRN
  Administered 2022-05-09: 100 mg via INTRAVENOUS
  Administered 2022-05-09: 200 mg via INTRAVENOUS

## 2022-05-09 MED ORDER — SODIUM CHLORIDE 0.9 % IR SOLN
Status: DC | PRN
  Administered 2022-05-09: 3000 mL

## 2022-05-09 MED ORDER — ROCURONIUM BROMIDE 100 MG/10ML IV SOLN
INTRAVENOUS | Status: DC | PRN
  Administered 2022-05-09: 80 mg via INTRAVENOUS
  Administered 2022-05-09: 20 mg via INTRAVENOUS

## 2022-05-09 MED ORDER — ASPIRIN 81 MG PO CHEW
CHEWABLE_TABLET | ORAL | Status: AC
  Filled 2022-05-09: qty 1

## 2022-05-09 MED ORDER — ONDANSETRON HCL 4 MG/2ML IJ SOLN
INTRAMUSCULAR | Status: DC | PRN
  Administered 2022-05-09: 4 mg via INTRAVENOUS

## 2022-05-09 MED ORDER — ACETAMINOPHEN 10 MG/ML IV SOLN
INTRAVENOUS | Status: AC
  Filled 2022-05-09: qty 100

## 2022-05-09 MED ORDER — ACETAMINOPHEN 10 MG/ML IV SOLN: 1000.0000 mg | Freq: Once | INTRAVENOUS | Status: DC | PRN

## 2022-05-09 MED ORDER — VALSARTAN-HYDROCHLOROTHIAZIDE 160-12.5 MG PO TABS: 1.0000 | ORAL_TABLET | ORAL | Status: DC

## 2022-05-09 MED ORDER — MIDAZOLAM HCL 5 MG/5ML IJ SOLN
INTRAMUSCULAR | Status: DC | PRN
  Administered 2022-05-09: 2 mg via INTRAVENOUS

## 2022-05-09 MED ORDER — IRBESARTAN 150 MG PO TABS
150.0000 mg | ORAL_TABLET | Freq: Every day | ORAL | Status: DC
  Administered 2022-05-10: 150 mg via ORAL
  Filled 2022-05-09: qty 1

## 2022-05-09 MED ORDER — CEFAZOLIN SODIUM-DEXTROSE 2-4 GM/100ML-% IV SOLN
2.0000 g | Freq: Four times a day (QID) | INTRAVENOUS | Status: AC
  Administered 2022-05-09 (×2): 2 g via INTRAVENOUS

## 2022-05-09 MED ORDER — CELECOXIB 200 MG PO CAPS
ORAL_CAPSULE | ORAL | Status: AC
  Filled 2022-05-09: qty 2

## 2022-05-09 MED ORDER — ONDANSETRON HCL 4 MG PO TABS: 4.0000 mg | ORAL_TABLET | Freq: Four times a day (QID) | ORAL | Status: DC | PRN

## 2022-05-09 MED ORDER — ATORVASTATIN CALCIUM 20 MG PO TABS
ORAL_TABLET | ORAL | Status: AC
  Filled 2022-05-09: qty 1

## 2022-05-09 MED ORDER — CELECOXIB 200 MG PO CAPS
ORAL_CAPSULE | ORAL | Status: AC
  Filled 2022-05-09: qty 1

## 2022-05-09 MED ORDER — KETAMINE HCL 10 MG/ML IJ SOLN
INTRAMUSCULAR | Status: DC | PRN
  Administered 2022-05-09: 40 mg via INTRAVENOUS

## 2022-05-09 MED ORDER — ACETAMINOPHEN 325 MG PO TABS: 325.0000 mg | ORAL_TABLET | Freq: Four times a day (QID) | ORAL | Status: DC | PRN

## 2022-05-09 MED ORDER — BUPIVACAINE HCL (PF) 0.5 % IJ SOLN
INTRAMUSCULAR | Status: AC
  Filled 2022-05-09: qty 10

## 2022-05-09 MED ORDER — HYDROMORPHONE HCL 1 MG/ML IJ SOLN
INTRAMUSCULAR | Status: DC | PRN
  Administered 2022-05-09 (×2): .5 mg via INTRAVENOUS

## 2022-05-09 MED ORDER — DEXMEDETOMIDINE HCL IN NACL 200 MCG/50ML IV SOLN
INTRAVENOUS | Status: DC | PRN
  Administered 2022-05-09: 8 ug via INTRAVENOUS
  Administered 2022-05-09: 12 ug via INTRAVENOUS

## 2022-05-09 MED ORDER — CLOPIDOGREL BISULFATE 75 MG PO TABS
75.0000 mg | ORAL_TABLET | Freq: Every day | ORAL | Status: DC
  Administered 2022-05-10: 75 mg via ORAL

## 2022-05-09 MED ORDER — MENTHOL 3 MG MT LOZG: 1.0000 | LOZENGE | OROMUCOSAL | Status: DC | PRN

## 2022-05-09 MED ORDER — CHLORHEXIDINE GLUCONATE 0.12 % MT SOLN
15.0000 mL | Freq: Once | OROMUCOSAL | Status: AC
  Administered 2022-05-09: 15 mL via OROMUCOSAL

## 2022-05-09 MED ORDER — PANTOPRAZOLE SODIUM 40 MG PO TBEC
40.0000 mg | DELAYED_RELEASE_TABLET | Freq: Two times a day (BID) | ORAL | Status: DC
  Administered 2022-05-09 – 2022-05-10 (×3): 40 mg via ORAL

## 2022-05-09 MED ORDER — FENTANYL CITRATE (PF) 100 MCG/2ML IJ SOLN
25.0000 ug | INTRAMUSCULAR | Status: DC | PRN
  Administered 2022-05-09 (×2): 50 ug via INTRAVENOUS
  Administered 2022-05-09: 25 ug via INTRAVENOUS
  Administered 2022-05-09: 50 ug via INTRAVENOUS
  Administered 2022-05-09: 25 ug via INTRAVENOUS

## 2022-05-09 MED ORDER — HYDROMORPHONE HCL 1 MG/ML IJ SOLN
INTRAMUSCULAR | Status: AC
  Filled 2022-05-09: qty 1

## 2022-05-09 MED ORDER — PROPOFOL 1000 MG/100ML IV EMUL
INTRAVENOUS | Status: AC
  Filled 2022-05-09: qty 100

## 2022-05-09 MED ORDER — PHENYLEPHRINE 80 MCG/ML (10ML) SYRINGE FOR IV PUSH (FOR BLOOD PRESSURE SUPPORT)
PREFILLED_SYRINGE | INTRAVENOUS | Status: DC | PRN
  Administered 2022-05-09 (×3): 80 ug via INTRAVENOUS

## 2022-05-09 MED ORDER — SODIUM CHLORIDE FLUSH 0.9 % IV SOLN
INTRAVENOUS | Status: AC
  Filled 2022-05-09: qty 40

## 2022-05-09 MED ORDER — DEXAMETHASONE SODIUM PHOSPHATE 10 MG/ML IJ SOLN
INTRAMUSCULAR | Status: AC
  Filled 2022-05-09: qty 1

## 2022-05-09 MED ORDER — CEFAZOLIN SODIUM-DEXTROSE 2-3 GM-%(50ML) IV SOLR
INTRAVENOUS | Status: DC | PRN
  Administered 2022-05-09: 3 g via INTRAVENOUS

## 2022-05-09 MED ORDER — PREDNISONE 10 MG PO TABS
20.0000 mg | ORAL_TABLET | Freq: Every day | ORAL | Status: DC
  Administered 2022-05-10: 20 mg via ORAL
  Filled 2022-05-09 (×2): qty 2

## 2022-05-09 MED ORDER — CHLORHEXIDINE GLUCONATE 4 % EX LIQD: 60.0000 mL | Freq: Once | CUTANEOUS | Status: DC

## 2022-05-09 MED ORDER — FLUTICASONE PROPIONATE 50 MCG/ACT NA SUSP: 2.0000 | NASAL | Status: DC | PRN

## 2022-05-09 MED ORDER — DEXAMETHASONE SODIUM PHOSPHATE 10 MG/ML IJ SOLN
8.0000 mg | Freq: Once | INTRAMUSCULAR | Status: AC
  Administered 2022-05-09: 8 mg via INTRAVENOUS

## 2022-05-09 MED ORDER — ALUM & MAG HYDROXIDE-SIMETH 200-200-20 MG/5ML PO SUSP: 30.0000 mL | ORAL | Status: DC | PRN

## 2022-05-09 MED ORDER — SODIUM CHLORIDE 0.9 % IV SOLN: INTRAVENOUS | Status: DC

## 2022-05-09 MED ORDER — FENTANYL CITRATE (PF) 100 MCG/2ML IJ SOLN
INTRAMUSCULAR | Status: DC | PRN
  Administered 2022-05-09 (×2): 50 ug via INTRAVENOUS

## 2022-05-09 MED ORDER — TRAMADOL HCL 50 MG PO TABS: 50.0000 mg | ORAL_TABLET | ORAL | Status: DC | PRN

## 2022-05-09 MED ORDER — CELECOXIB 200 MG PO CAPS
400.0000 mg | ORAL_CAPSULE | Freq: Once | ORAL | Status: AC
  Administered 2022-05-09: 400 mg via ORAL

## 2022-05-09 MED ORDER — PROMETHAZINE HCL 25 MG/ML IJ SOLN: 6.2500 mg | INTRAMUSCULAR | Status: DC | PRN

## 2022-05-09 MED ORDER — EZETIMIBE 10 MG PO TABS
ORAL_TABLET | ORAL | Status: AC
  Filled 2022-05-09: qty 1

## 2022-05-09 MED ORDER — ATORVASTATIN CALCIUM 20 MG PO TABS
20.0000 mg | ORAL_TABLET | Freq: Every day | ORAL | Status: DC
  Administered 2022-05-09 – 2022-05-10 (×2): 20 mg via ORAL

## 2022-05-09 MED ORDER — BUPROPION HCL ER (XL) 150 MG PO TB24
150.0000 mg | ORAL_TABLET | Freq: Every day | ORAL | Status: DC
  Administered 2022-05-09 – 2022-05-10 (×2): 150 mg via ORAL
  Filled 2022-05-09 (×2): qty 1

## 2022-05-09 MED ORDER — PREGABALIN 75 MG PO CAPS
75.0000 mg | ORAL_CAPSULE | Freq: Every day | ORAL | Status: DC
  Filled 2022-05-09: qty 1

## 2022-05-09 MED ORDER — KETAMINE HCL 50 MG/5ML IJ SOSY
PREFILLED_SYRINGE | INTRAMUSCULAR | Status: AC
  Filled 2022-05-09: qty 5

## 2022-05-09 MED ORDER — EPHEDRINE SULFATE (PRESSORS) 50 MG/ML IJ SOLN
INTRAMUSCULAR | Status: DC | PRN
  Administered 2022-05-09: 10 mg via INTRAVENOUS

## 2022-05-09 MED ORDER — OXYCODONE HCL 5 MG/5ML PO SOLN: 5.0000 mg | Freq: Once | ORAL | Status: AC | PRN

## 2022-05-09 MED ORDER — MIDAZOLAM HCL 2 MG/2ML IJ SOLN
INTRAMUSCULAR | Status: AC
  Filled 2022-05-09: qty 2

## 2022-05-09 MED ORDER — MAGNESIUM HYDROXIDE 400 MG/5ML PO SUSP
30.0000 mL | Freq: Every day | ORAL | Status: DC
  Administered 2022-05-09: 30 mL via ORAL

## 2022-05-09 MED ORDER — PREGABALIN 50 MG PO CAPS
150.0000 mg | ORAL_CAPSULE | Freq: Every day | ORAL | Status: DC
  Administered 2022-05-09: 150 mg via ORAL

## 2022-05-09 MED ORDER — LORATADINE 10 MG PO TABS
10.0000 mg | ORAL_TABLET | Freq: Every day | ORAL | Status: DC
  Administered 2022-05-10: 10 mg via ORAL

## 2022-05-09 MED ORDER — BISACODYL 10 MG RE SUPP: 10.0000 mg | Freq: Every day | RECTAL | Status: DC | PRN

## 2022-05-09 MED ORDER — CEFAZOLIN IN SODIUM CHLORIDE 3-0.9 GM/100ML-% IV SOLN
3.0000 g | INTRAVENOUS | Status: DC
  Filled 2022-05-09: qty 100

## 2022-05-09 MED ORDER — BUPIVACAINE LIPOSOME 1.3 % IJ SUSP
INTRAMUSCULAR | Status: AC
  Filled 2022-05-09: qty 20

## 2022-05-09 MED ORDER — ACETAMINOPHEN 10 MG/ML IV SOLN
1000.0000 mg | Freq: Four times a day (QID) | INTRAVENOUS | Status: DC
  Administered 2022-05-09 – 2022-05-10 (×3): 1000 mg via INTRAVENOUS

## 2022-05-09 MED ORDER — CELECOXIB 200 MG PO CAPS
200.0000 mg | ORAL_CAPSULE | Freq: Two times a day (BID) | ORAL | Status: DC
  Administered 2022-05-09 – 2022-05-10 (×2): 200 mg via ORAL

## 2022-05-09 MED ORDER — BUPIVACAINE HCL (PF) 0.25 % IJ SOLN
INTRAMUSCULAR | Status: AC
  Filled 2022-05-09: qty 60

## 2022-05-09 MED ORDER — DULOXETINE HCL 60 MG PO CPEP
60.0000 mg | ORAL_CAPSULE | ORAL | Status: DC
  Administered 2022-05-10: 60 mg via ORAL
  Filled 2022-05-09: qty 1

## 2022-05-09 MED ORDER — ONDANSETRON HCL 4 MG/2ML IJ SOLN: 4.0000 mg | Freq: Four times a day (QID) | INTRAMUSCULAR | Status: DC | PRN

## 2022-05-09 MED ORDER — HYDROMORPHONE HCL 1 MG/ML IJ SOLN
0.5000 mg | INTRAMUSCULAR | Status: DC | PRN
  Administered 2022-05-10: 0.5 mg via INTRAVENOUS

## 2022-05-09 MED ORDER — ASPIRIN 81 MG PO CHEW
81.0000 mg | CHEWABLE_TABLET | Freq: Two times a day (BID) | ORAL | Status: DC
  Administered 2022-05-09 – 2022-05-10 (×2): 81 mg via ORAL

## 2022-05-09 MED ORDER — DIPHENHYDRAMINE HCL 12.5 MG/5ML PO ELIX: 12.5000 mg | ORAL_SOLUTION | ORAL | Status: DC | PRN

## 2022-05-09 MED ORDER — OXYCODONE-ACETAMINOPHEN 5-325 MG PO TABS
2.0000 | ORAL_TABLET | Freq: Two times a day (BID) | ORAL | Status: DC
  Administered 2022-05-09: 2 via ORAL

## 2022-05-09 MED ORDER — OXYCODONE HCL 5 MG PO TABS
10.0000 mg | ORAL_TABLET | ORAL | Status: DC | PRN
  Administered 2022-05-10: 10 mg via ORAL

## 2022-05-09 MED ORDER — CHLORHEXIDINE GLUCONATE 0.12 % MT SOLN
OROMUCOSAL | Status: AC
  Filled 2022-05-09: qty 15

## 2022-05-09 MED ORDER — OXYCODONE-ACETAMINOPHEN 5-325 MG PO TABS
ORAL_TABLET | ORAL | Status: AC
  Filled 2022-05-09: qty 2

## 2022-05-09 MED ORDER — FAMOTIDINE 20 MG PO TABS
ORAL_TABLET | ORAL | Status: AC
  Filled 2022-05-09: qty 1

## 2022-05-09 MED ORDER — COLCHICINE 0.6 MG PO TABS: 0.6000 mg | ORAL_TABLET | Freq: Every day | ORAL | Status: DC | PRN

## 2022-05-09 MED ORDER — SENNOSIDES-DOCUSATE SODIUM 8.6-50 MG PO TABS
1.0000 | ORAL_TABLET | Freq: Two times a day (BID) | ORAL | Status: DC
  Administered 2022-05-09 – 2022-05-10 (×3): 1 via ORAL

## 2022-05-09 SURGICAL SUPPLY — 78 items
ATTUNE MED DOME PAT 41 KNEE (Knees) IMPLANT
ATTUNE PS FEM RT SZ 7 CEM KNEE (Femur) IMPLANT
ATTUNE PSRP INSR SZ7 5 KNEE (Insert) IMPLANT
BASE TIBIAL ROT PLAT SZ 8 KNEE (Knees) IMPLANT
BATTERY INSTRU NAVIGATION (MISCELLANEOUS) ×4 IMPLANT
BLADE SAW 70X12.5 (BLADE) ×1 IMPLANT
BLADE SAW 90X13X1.19 OSCILLAT (BLADE) ×1 IMPLANT
BLADE SAW 90X25X1.19 OSCILLAT (BLADE) ×1 IMPLANT
BONE CEMENT GENTAMICIN (Cement) ×2 IMPLANT
BSPLAT TIB 8 CMNT ROT PLAT STR (Knees) ×1 IMPLANT
BTRY SRG DRVR LF (MISCELLANEOUS) ×4
CEMENT BONE GENTAMICIN 40 (Cement) IMPLANT
COOLER POLAR GLACIER W/PUMP (MISCELLANEOUS) ×1 IMPLANT
CUFF TOURN SGL QUICK 24 (TOURNIQUET CUFF)
CUFF TOURN SGL QUICK 34 (TOURNIQUET CUFF)
CUFF TRNQT CYL 24X4X16.5-23 (TOURNIQUET CUFF) IMPLANT
CUFF TRNQT CYL 34X4.125X (TOURNIQUET CUFF) IMPLANT
DRAPE 3/4 80X56 (DRAPES) ×1 IMPLANT
DRAPE INCISE IOBAN 66X45 STRL (DRAPES) IMPLANT
DRSG AQUACEL AG ADV 3.5X14 (GAUZE/BANDAGES/DRESSINGS) IMPLANT
DRSG DERMACEA 8X12 NADH (GAUZE/BANDAGES/DRESSINGS) IMPLANT
DRSG DERMACEA NONADH 3X8 (GAUZE/BANDAGES/DRESSINGS) ×1 IMPLANT
DRSG MEPILEX SACRM 8.7X9.8 (GAUZE/BANDAGES/DRESSINGS) ×1 IMPLANT
DRSG OPSITE POSTOP 4X14 (GAUZE/BANDAGES/DRESSINGS) ×1 IMPLANT
DRSG TEGADERM 4X4.75 (GAUZE/BANDAGES/DRESSINGS) ×1 IMPLANT
DURAPREP 26ML APPLICATOR (WOUND CARE) ×2 IMPLANT
ELECT CAUTERY BLADE 6.4 (BLADE) ×1 IMPLANT
ELECT REM PT RETURN 9FT ADLT (ELECTROSURGICAL) ×1
ELECTRODE REM PT RTRN 9FT ADLT (ELECTROSURGICAL) ×1 IMPLANT
EX-PIN ORTHOLOCK NAV 4X150 (PIN) ×2 IMPLANT
GLOVE BIOGEL M STRL SZ7.5 (GLOVE) ×2 IMPLANT
GLOVE SRG 8 PF TXTR STRL LF DI (GLOVE) ×1 IMPLANT
GLOVE SURG UNDER POLY LF SZ8 (GLOVE) ×1
GOWN STRL REUS W/ TWL LRG LVL3 (GOWN DISPOSABLE) ×1 IMPLANT
GOWN STRL REUS W/TWL LRG LVL3 (GOWN DISPOSABLE) ×2
GOWN TOGA ZIPPER T7+ PEEL AWAY (MISCELLANEOUS) ×1 IMPLANT
HANDLE YANKAUER SUCT OPEN TIP (MISCELLANEOUS) ×1 IMPLANT
HEMOVAC 400CC 10FR (MISCELLANEOUS) ×1 IMPLANT
HOLDER FOLEY CATH W/STRAP (MISCELLANEOUS) ×1 IMPLANT
HOOD PEEL AWAY T7 (MISCELLANEOUS) IMPLANT
IV NS IRRIG 3000ML ARTHROMATIC (IV SOLUTION) ×1 IMPLANT
KIT TURNOVER KIT A (KITS) ×1 IMPLANT
KNIFE SCULPS 14X20 (INSTRUMENTS) ×1 IMPLANT
MANIFOLD NEPTUNE II (INSTRUMENTS) ×2 IMPLANT
NDL SPNL 20GX3.5 QUINCKE YW (NEEDLE) ×2 IMPLANT
NEEDLE SPNL 20GX3.5 QUINCKE YW (NEEDLE) ×2 IMPLANT
NS IRRIG 500ML POUR BTL (IV SOLUTION) ×1 IMPLANT
PACK TOTAL KNEE (MISCELLANEOUS) ×1 IMPLANT
PAD ABD DERMACEA PRESS 5X9 (GAUZE/BANDAGES/DRESSINGS) ×2 IMPLANT
PAD WRAPON POLAR KNEE (MISCELLANEOUS) ×1 IMPLANT
PADDING CAST COTTON 6X4 ST (SOFTGOODS) IMPLANT
PIN DRILL FIX HALF THREAD (BIT) ×2 IMPLANT
PIN DRILL QUICK PACK ×2 IMPLANT
PIN FIXATION 1/8DIA X 3INL (PIN) ×1 IMPLANT
PULSAVAC PLUS IRRIG FAN TIP (DISPOSABLE) ×1
SOL PREP PVP 2OZ (MISCELLANEOUS) ×1
SOLUTION IRRIG SURGIPHOR (IV SOLUTION) ×1 IMPLANT
SOLUTION PREP PVP 2OZ (MISCELLANEOUS) ×1 IMPLANT
SPONGE DRAIN TRACH 4X4 STRL 2S (GAUZE/BANDAGES/DRESSINGS) ×1 IMPLANT
STAPLER SKIN PROX 35W (STAPLE) ×1 IMPLANT
STOCKINETTE BIAS CUT 6 980064 (GAUZE/BANDAGES/DRESSINGS) IMPLANT
STOCKINETTE IMPERV 14X48 (MISCELLANEOUS) ×1 IMPLANT
STRAP TIBIA SHORT (MISCELLANEOUS) ×1 IMPLANT
SUCTION FRAZIER HANDLE 10FR (MISCELLANEOUS) ×1
SUCTION TUBE FRAZIER 10FR DISP (MISCELLANEOUS) ×1 IMPLANT
SUT VIC AB 0 CT1 36 (SUTURE) ×1 IMPLANT
SUT VIC AB 1 CT1 36 (SUTURE) ×2 IMPLANT
SUT VIC AB 2-0 CT2 27 (SUTURE) ×1 IMPLANT
SYR 30ML LL (SYRINGE) ×2 IMPLANT
TAPE PAPER 2X10 WHT MICROPORE (GAUZE/BANDAGES/DRESSINGS) IMPLANT
TIBIAL BASE ROT PLAT SZ 8 KNEE (Knees) ×1 IMPLANT
TIP FAN IRRIG PULSAVAC PLUS (DISPOSABLE) ×1 IMPLANT
TOWEL OR 17X26 4PK STRL BLUE (TOWEL DISPOSABLE) IMPLANT
TOWER CARTRIDGE SMART MIX (DISPOSABLE) ×1 IMPLANT
TRAP FLUID SMOKE EVACUATOR (MISCELLANEOUS) ×1 IMPLANT
TRAY FOLEY MTR SLVR 16FR STAT (SET/KITS/TRAYS/PACK) ×1 IMPLANT
WATER STERILE IRR 1000ML POUR (IV SOLUTION) ×1 IMPLANT
WRAPON POLAR PAD KNEE (MISCELLANEOUS) ×1

## 2022-05-09 NOTE — Interval H&P Note (Signed)
History and Physical Interval Note:  05/09/2022 6:07 AM  Robert Greer  has presented today for surgery, with the diagnosis of PRIMARY OSTEOARTHRITIS OF RIGHT KNEE..  The various methods of treatment have been discussed with the patient and family. After consideration of risks, benefits and other options for treatment, the patient has consented to  Procedure(s): COMPUTER ASSISTED TOTAL KNEE ARTHROPLASTY - RNFA (Right) as a surgical intervention.  The patient's history has been reviewed, patient examined, no change in status, stable for surgery.  I have reviewed the patient's chart and labs.  Questions were answered to the patient's satisfaction.     [name] P Priseis Cratty

## 2022-05-09 NOTE — Anesthesia Postprocedure Evaluation (Signed)
Anesthesia Post Note  Patient: Robert Greer  Procedure(s) Performed: COMPUTER ASSISTED TOTAL KNEE ARTHROPLASTY - RNFA (Right: Knee)  Patient location during evaluation: PACU Anesthesia Type: Spinal Level of consciousness: awake and alert Pain management: pain level controlled Vital Signs Assessment: post-procedure vital signs reviewed and stable Respiratory status: spontaneous breathing, nonlabored ventilation and respiratory function stable Cardiovascular status: blood pressure returned to baseline and stable Postop Assessment: no apparent nausea or vomiting Anesthetic complications: no   No notable events documented.   Last Vitals:  Vitals:   05/09/22 1420 05/09/22 1427  BP:  118/73  Pulse: 86 84  Resp: 13 16  Temp:  37.2 C  SpO2: 96% 96%    Last Pain:  Vitals:   05/09/22 1427  TempSrc: Temporal  PainSc:                  Foye Deer

## 2022-05-09 NOTE — TOC Initial Note (Signed)
Transition of Care [facility]) - Initial/Assessment Note    Patient Details  Name: Robert Greer MRN: [id number] Date of Birth: 06-22-1955  [facility] CM/SW Contact:    Marlowe Sax, RN Phone Number: 05/09/2022, 3:19 PM  Clinical Narrative:                 Patient is set up with [facility] services prior to [facility] by Surgeons office  Expected Discharge Plan: Home w Home Health Services Barriers to Discharge: No Barriers Identified   Patient Goals and CMS Choice            Expected Discharge Plan and Services                                              Prior Living Arrangements/Services                       Activities of Daily Living Home Assistive Devices/Equipment: Cane (specify quad or straight), Bedside commode/3-in-1, Walker (specify type) ADL Screening (condition at time of admission) Patient's cognitive ability adequate to safely complete daily activities?: Yes Is the patient deaf or have difficulty hearing?: No Does the patient have difficulty seeing, even when wearing glasses/contacts?: No (uses readers) Does the patient have difficulty concentrating, remembering, or making decisions?: No Patient able to express need for assistance with ADLs?: Yes Does the patient have difficulty dressing or bathing?: No Independently performs ADLs?: Yes (appropriate for developmental age) Does the patient have difficulty walking or climbing stairs?: No Weakness of Legs: None Weakness of Arms/Hands: None  Permission Sought/Granted                  Emotional Assessment              Admission diagnosis:  Total knee replacement status [Z96.659] Patient Active Problem List   Diagnosis Date Noted   Total knee replacement status 05/09/2022   Anesthesia complication 03/28/2022   Chronic anticoagulation 03/28/2022   Failed back surgical syndrome 03/28/2022   Obesity (BMI 30-39.9) 03/28/2022   Chronic pain 03/28/2022    Primary osteoarthritis of right knee 11/19/2021   OSA (obstructive sleep apnea) 10/09/2020   Primary osteoarthritis involving multiple joints 02/06/2019   Gout 06/18/2017   Other hemochromatosis 12/05/2016   Coronary artery disease of native artery of native heart with stable angina pectoris 09/09/2016   Anxiety and depression 02/09/2015   Hypertension 02/09/2015   GERD (gastroesophageal reflux disease) 02/09/2015   History of ST elevation myocardial infarction (STEMI)    Chronic low back pain 06/30/2014   Mixed hyperlipidemia 08/20/2011   Primary osteoarthritis of shoulder 01/03/2011   Disorder of bursae and tendons in shoulder region 10/16/2010   PCP:  Lorre Munroe, NP Pharmacy:   [facility] - [place], [place] - [address] [place] [address] Phone: [phone] Fax: [phone]     Social Determinants of Health (SDOH) Social History: SDOH Screenings   Food Insecurity: No Food Insecurity (05/09/2022)  Housing: Low Risk  (05/09/2022)  Transportation Needs: No Transportation Needs (05/09/2022)  Utilities: Not At Risk (05/09/2022)  Alcohol Screen: Low Risk  (07/02/2017)  Depression (PHQ2-9): High Risk (02/15/2022)  Financial Resource Strain: Low Risk  (04/12/2020)  Physical Activity: Inactive (04/12/2020)  Stress: No Stress Concern Present (04/12/2020)  Tobacco  Use: High Risk (05/09/2022)   SDOH Interventions: Housing Interventions: Intervention Not Indicated   Readmission Risk Interventions     No data to display

## 2022-05-09 NOTE — Transfer of Care (Signed)
Immediate Anesthesia Transfer of Care Note  Patient: Robert Greer  Procedure(s) Performed: COMPUTER ASSISTED TOTAL KNEE ARTHROPLASTY - RNFA (Right: Knee)  Patient Location: PACU  Anesthesia Type:General  Level of Consciousness: awake, alert , and oriented  Airway & Oxygen Therapy: Patient Spontanous Breathing and Patient connected to face mask oxygen  Post-op Assessment: Report given to RN and Post -op Vital signs reviewed and stable  Post vital signs: Reviewed and stable  Last Vitals:  Vitals Value Taken Time  BP 113/71 05/09/22 1203  Temp    Pulse 84 05/09/22 1208  Resp 15 05/09/22 1208  SpO2 97 % 05/09/22 1208  Vitals shown include unvalidated device data.  Last Pain:  Vitals:   05/09/22 0630  TempSrc: Oral  PainSc: 0-No pain         Complications: No notable events documented.

## 2022-05-09 NOTE — Op Note (Signed)
OPERATIVE NOTE  DATE OF SURGERY:  05/09/2022  PATIENT NAME:  Robert Greer   DOB: [birth year]  MRN: [id number]  PRE-OPERATIVE DIAGNOSIS: Degenerative arthrosis of the right knee, primary  POST-OPERATIVE DIAGNOSIS:  Same  PROCEDURE:  Right total knee arthroplasty using computer-assisted navigation  SURGEON:  Jena Gauss. M.D.  ANESTHESIA: general  ESTIMATED BLOOD LOSS: 50 mL  FLUIDS REPLACED: 900 mL of crystalloid  TOURNIQUET TIME: 101 minutes  DRAINS: 2 medium Hemovac drains  SOFT TISSUE RELEASES: Anterior cruciate ligament, posterior cruciate ligament, deep medial collateral ligament, patellofemoral ligament  IMPLANTS UTILIZED: DePuy Attune size 7 posterior stabilized femoral component (cemented), size 8 rotating platform tibial component (cemented), 41 mm medialized dome patella (cemented), and a 5 mm stabilized rotating platform polyethylene insert.  INDICATIONS FOR SURGERY: Robert Greer is a 66 y.o. year old male with a long history of progressive knee pain. X-rays demonstrated severe degenerative changes in tricompartmental fashion. The patient had not seen any significant improvement despite conservative nonsurgical intervention. After discussion of the risks and benefits of surgical intervention, the patient expressed understanding of the risks benefits and agree with plans for total knee arthroplasty.   The risks, benefits, and alternatives were discussed at length including but not limited to the risks of infection, bleeding, nerve injury, stiffness, blood clots, the need for revision surgery, cardiopulmonary complications, among others, and they were willing to proceed.  PROCEDURE IN DETAIL: The patient was brought into the operating room and, after adequate general anesthesia was achieved, a tourniquet was placed on the patient's upper thigh. The patient's knee and leg were cleaned and prepped with alcohol and DuraPrep and draped in the usual sterile fashion. A "timeout"  was performed as per usual protocol. The lower extremity was exsanguinated using an Esmarch, and the tourniquet was inflated to 300 mmHg. An anterior longitudinal incision was made followed by a standard mid vastus approach. The deep fibers of the medial collateral ligament were elevated in a subperiosteal fashion off of the medial flare of the tibia so as to maintain a continuous soft tissue sleeve. The patella was subluxed laterally and the patellofemoral ligament was incised. Inspection of the knee demonstrated severe degenerative changes with full-thickness loss of articular cartilage. Osteophytes were debrided using a rongeur. Anterior and posterior cruciate ligaments were excised. Two 4.0 mm Schanz pins were inserted in the femur and into the tibia for attachment of the array of trackers used for computer-assisted navigation. Hip center was identified using a circumduction technique. Distal landmarks were mapped using the computer. The distal femur and proximal tibia were mapped using the computer. The distal femoral cutting guide was positioned using computer-assisted navigation so as to achieve a 5 distal valgus cut. The femur was sized and it was felt that a size 7 femoral component was appropriate. A size 7 femoral cutting guide was positioned and the anterior cut was performed and verified using the computer. This was followed by completion of the posterior and chamfer cuts. Femoral cutting guide for the central box was then positioned in the center box cut was performed.  Attention was then directed to the proximal tibia. Medial and lateral menisci were excised. The extramedullary tibial cutting guide was positioned using computer-assisted navigation so as to achieve a 0 varus-valgus alignment and 3 posterior slope. The cut was performed and verified using the computer. The proximal tibia was sized and it was felt that a size 8 tibial tray was appropriate. Tibial and femoral trials were inserted  followed  by insertion of a 5 mm polyethylene insert. This allowed for excellent mediolateral soft tissue balancing both in flexion and in full extension. Finally, the patella was cut and prepared so as to accommodate a 41 mm medialized dome patella. A patella trial was placed and the knee was placed through a range of motion with excellent patellar tracking appreciated. The femoral trial was removed after debridement of posterior osteophytes. The central post-hole for the tibial component was reamed followed by insertion of a keel punch. Tibial trials were then removed. Cut surfaces of bone were irrigated with copious amounts of normal saline using pulsatile lavage and then suctioned dry. Polymethylmethacrylate cement with gentamicin was prepared in the usual fashion using a vacuum mixer. Cement was applied to the cut surface of the proximal tibia as well as along the undersurface of a size 8 rotating platform tibial component. Tibial component was positioned and impacted into place. Excess cement was removed using Personal assistant. Cement was then applied to the cut surfaces of the femur as well as along the posterior flanges of the size 7 femoral component. The femoral component was positioned and impacted into place. Excess cement was removed using Personal assistant. A 5 mm polyethylene trial was inserted and the knee was brought into full extension with steady axial compression applied. Finally, cement was applied to the backside of a 41 mm medialized dome patella and the patellar component was positioned and patellar clamp applied. Excess cement was removed using Personal assistant. After adequate curing of the cement, the tourniquet was deflated after a total tourniquet time of 101 minutes. Hemostasis was achieved using electrocautery. The knee was irrigated with copious amounts of normal saline using pulsatile lavage followed by 450 ml of Surgiphor and then suctioned dry. 20 mL of 1.3% Exparel and 60 mL of 0.25%  Marcaine in 40 mL of normal saline was injected along the posterior capsule, medial and lateral gutters, and along the arthrotomy site. A 5 mm stabilized rotating platform polyethylene insert was inserted and the knee was placed through a range of motion with excellent mediolateral soft tissue balancing appreciated and excellent patellar tracking noted. 2 medium drains were placed in the wound bed and brought out through separate stab incisions. The medial parapatellar portion of the incision was reapproximated using interrupted sutures of #1 Vicryl. Subcutaneous tissue was approximated in layers using first #0 Vicryl followed #2-0 Vicryl. The skin was approximated with skin staples. A sterile dressing was applied.  The patient tolerated the procedure well and was transported to the recovery room in stable condition.    Keah Lamba P. Angie Fava., M.D.

## 2022-05-09 NOTE — Anesthesia Procedure Notes (Signed)
Procedure Name: Intubation Date/Time: 05/09/2022 8:00 AM  Performed by: Valary Manahan, CRNAPre-anesthesia Checklist: Patient identified, Patient being monitored, Timeout performed, Emergency Drugs available and Suction available Patient Re-evaluated:Patient Re-evaluated prior to induction Oxygen Delivery Method: Circle system utilized Preoxygenation: Pre-oxygenation with 100% oxygen Induction Type: IV induction Ventilation: Oral airway inserted - appropriate to patient size Laryngoscope Size: Mac and 4 Grade View: Grade II Tube type: Oral Tube size: 7.5 mm Number of attempts: 2 (first attempt patient was inadequately paralyzed) Airway Equipment and Method: Stylet Placement Confirmation: ETT inserted through vocal cords under direct vision, positive ETCO2 and breath sounds checked- equal and bilateral Secured at: 23 cm Tube secured with: Tape Dental Injury: Teeth and Oropharynx as per pre-operative assessment

## 2022-05-09 NOTE — Evaluation (Signed)
Physical Therapy Evaluation Patient Details Name: Robert Greer MRN: [id number] DOB: 01-13-1956 Today's Date: 05/09/2022  History of Present Illness  Pt admitted for R TKR and is POD 0 at time of evaluation. History includes anxiety, arthritis, depression, GERD, HTN, and gout.  Clinical Impression  Pt is a pleasant 67 year old male who was admitted for R TKR. Pt performs bed mobility, transfers, and ambulation with cga and RW. Pt demonstrates deficits with strength/mobility/pain. Would benefit from skilled PT to address above deficits and promote optimal return to PLOF. Pt demonstrates ability to perform 10 SLRs with independence, therefore does not require KI for mobility. Will continue to receive skilled therapy while admitted and defer to [facility] and care team for disposition.      Recommendations for follow up therapy are one component of a multi-disciplinary discharge planning process, led by the attending physician.  Recommendations may be updated based on patient status, additional functional criteria and insurance authorization.  Follow Up Recommendations       Assistance Recommended at Discharge Set up Supervision/Assistance  Patient can return home with the following  A little help with walking and/or transfers;Help with stairs or ramp for entrance    Equipment Recommendations BSC/3in1  Recommendations for Other Services       Functional Status Assessment Patient has had a recent decline in their functional status and demonstrates the ability to make significant improvements in function in a reasonable and predictable amount of time.     Precautions / Restrictions Precautions Precautions: Fall;Knee Precaution Booklet Issued: Yes (comment) Restrictions Weight Bearing Restrictions: Yes RLE Weight Bearing: Weight bearing as tolerated      Mobility  Bed Mobility Overal bed mobility: Needs Assistance Bed Mobility: Supine to Sit     Supine to sit: Min guard     General bed  mobility comments: safe technique with ease of mobility    Transfers Overall transfer level: Needs assistance Equipment used: Rolling walker (2 wheels) Transfers: Sit to/from Stand Sit to Stand: Min guard           General transfer comment: safe technique with cues for sequencing    Ambulation/Gait Ambulation/Gait assistance: Min guard Gait Distance (Feet): 100 Feet Assistive device: Rolling walker (2 wheels) Gait Pattern/deviations: Step-to pattern       General Gait Details: ambulated with step to gait pattern and occasional cues for keeping RW closer to body. Decreased weight bearing on surgical leg with heavy WBing through B UEs  Stairs            Wheelchair Mobility    Modified Rankin (Stroke Patients Only)       Balance Overall balance assessment: Needs assistance Sitting-balance support: Feet supported Sitting balance-Leahy Scale: Good     Standing balance support: Bilateral upper extremity supported Standing balance-Leahy Scale: Good                               Pertinent Vitals/Pain Pain Assessment Pain Assessment: No/denies pain    Home Living Family/patient expects to be discharged to:: Private residence Living Arrangements: Spouse/significant other Available Help at Discharge: Family;Available 24 hours/[place] Type of Home: House Home Access: Stairs to enter Entrance Stairs-Rails: None Entrance Stairs-Number of Steps: 2   Home Layout: One level Home Equipment: Agricultural consultant (2 wheels);Cane - single point      Prior Function Prior Level of Function : Independent/Modified Independent;Working/employed;Driving;History of Falls (last six months)  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 Reports 1 recent fall. Indep prior ADLs Comments: indep     Hand Dominance        Extremity/Trunk Assessment   Upper Extremity Assessment Upper Extremity Assessment: Overall WFL for tasks assessed     Lower Extremity Assessment Lower Extremity Assessment: Generalized weakness (R LE grossly 3/5)       Communication   Communication: No difficulties  Cognition Arousal/Alertness: Awake/alert Behavior During Therapy: WFL for tasks assessed/performed Overall Cognitive Status: Within Functional Limits for tasks assessed                                          General Comments      Exercises Total Joint Exercises Goniometric ROM: R knee AAROM: 0-72 degrees Other Exercises Other Exercises: ambulated to bathroom, supervision for hygiene. Other Exercises: supine ther-ex performed on B LE including SLRs, AP, and hip abd/add. 10 reps   Assessment/Plan    PT Assessment Patient needs continued PT services  PT Problem List Decreased strength;Decreased range of motion;Decreased balance;Decreased mobility;Pain       PT Treatment Interventions DME instruction;Gait training;Stair training;Therapeutic exercise    PT Goals (Current goals can be found in the Care Plan section)  Acute Rehab PT Goals Patient Stated Goal: to go home PT Goal Formulation: With patient Time For Goal Achievement: 05/23/22 Potential to Achieve Goals: Good    Frequency BID     Co-evaluation               AM-PAC PT "6 Clicks" Mobility  Outcome Measure Help needed turning from your back to your side while in a flat bed without using bedrails?: None Help needed moving from lying on your back to sitting on the side of a flat bed without using bedrails?: A Little Help needed moving to and from a bed to a chair (including a wheelchair)?: A Little Help needed standing up from a chair using your arms (e.g., wheelchair or bedside chair)?: A Little Help needed to walk in hospital room?: A Little Help needed climbing 3-5 steps with a railing? : A Lot 6 Click Score: 18    End of Session Equipment Utilized During Treatment: Gait belt Activity Tolerance: Patient tolerated treatment  well Patient left: in chair Nurse Communication: Mobility status PT Visit Diagnosis: Muscle weakness (generalized) (M62.81);Difficulty in walking, not elsewhere classified (R26.2);Pain Pain - Right/Left: Right Pain - part of body: Knee    Time: 9604-5409 PT Time Calculation (min) (ACUTE ONLY): 38 min   Charges:   PT Evaluation $PT Eval Low Complexity: 1 Low PT Treatments $Gait Training: 8-22 mins $Therapeutic Exercise: 8-22 mins        Elizabeth Palau, PT, DPT, GCS [phone]   Margorie Renner 05/09/2022, 4:47 PM

## 2022-05-10 ENCOUNTER — Encounter: Payer: Self-pay | Admitting: Orthopedic Surgery

## 2022-05-10 ENCOUNTER — Telehealth: Payer: Self-pay

## 2022-05-10 DIAGNOSIS — M1711 Unilateral primary osteoarthritis, right knee: Secondary | ICD-10-CM | POA: Diagnosis not present

## 2022-05-10 DIAGNOSIS — Z7982 Long term (current) use of aspirin: Secondary | ICD-10-CM | POA: Diagnosis not present

## 2022-05-10 DIAGNOSIS — I2510 Atherosclerotic heart disease of native coronary artery without angina pectoris: Secondary | ICD-10-CM | POA: Diagnosis not present

## 2022-05-10 DIAGNOSIS — Z955 Presence of coronary angioplasty implant and graft: Secondary | ICD-10-CM | POA: Diagnosis not present

## 2022-05-10 DIAGNOSIS — Z7902 Long term (current) use of antithrombotics/antiplatelets: Secondary | ICD-10-CM | POA: Diagnosis not present

## 2022-05-10 DIAGNOSIS — Z79899 Other long term (current) drug therapy: Secondary | ICD-10-CM | POA: Diagnosis not present

## 2022-05-10 MED ORDER — MAGNESIUM HYDROXIDE 400 MG/5ML PO SUSP
ORAL | Status: AC
  Filled 2022-05-10: qty 30

## 2022-05-10 MED ORDER — EZETIMIBE 10 MG PO TABS
ORAL_TABLET | ORAL | Status: AC
  Filled 2022-05-10: qty 1

## 2022-05-10 MED ORDER — ASPIRIN 81 MG PO CHEW
CHEWABLE_TABLET | ORAL | Status: AC
  Filled 2022-05-10: qty 1

## 2022-05-10 MED ORDER — FERROUS SULFATE 325 (65 FE) MG PO TABS
ORAL_TABLET | ORAL | Status: AC
  Filled 2022-05-10: qty 1

## 2022-05-10 MED ORDER — LORATADINE 10 MG PO TABS
ORAL_TABLET | ORAL | Status: AC
  Filled 2022-05-10: qty 1

## 2022-05-10 MED ORDER — ACETAMINOPHEN 10 MG/ML IV SOLN
INTRAVENOUS | Status: AC
  Filled 2022-05-10: qty 100

## 2022-05-10 MED ORDER — HYDROMORPHONE HCL 1 MG/ML IJ SOLN
INTRAMUSCULAR | Status: AC
  Filled 2022-05-10: qty 0.5

## 2022-05-10 MED ORDER — TRAMADOL HCL 50 MG PO TABS: 50.0000 mg | ORAL_TABLET | ORAL | 0 refills | Status: DC | PRN

## 2022-05-10 MED ORDER — OXYCODONE HCL 5 MG PO TABS
ORAL_TABLET | ORAL | Status: AC
  Filled 2022-05-10: qty 2

## 2022-05-10 MED ORDER — SENNOSIDES-DOCUSATE SODIUM 8.6-50 MG PO TABS
ORAL_TABLET | ORAL | Status: AC
  Filled 2022-05-10: qty 1

## 2022-05-10 MED ORDER — PANTOPRAZOLE SODIUM 40 MG PO TBEC
DELAYED_RELEASE_TABLET | ORAL | Status: AC
  Filled 2022-05-10: qty 1

## 2022-05-10 MED ORDER — CELECOXIB 200 MG PO CAPS
ORAL_CAPSULE | ORAL | Status: AC
  Filled 2022-05-10: qty 1

## 2022-05-10 MED ORDER — ATORVASTATIN CALCIUM 20 MG PO TABS
ORAL_TABLET | ORAL | Status: AC
  Filled 2022-05-10: qty 1

## 2022-05-10 MED ORDER — METOCLOPRAMIDE HCL 10 MG PO TABS
ORAL_TABLET | ORAL | Status: AC
  Filled 2022-05-10: qty 1

## 2022-05-10 MED ORDER — ASPIRIN 81 MG PO CHEW: 81.0000 mg | CHEWABLE_TABLET | Freq: Two times a day (BID) | ORAL | 0 refills | Status: DC

## 2022-05-10 MED ORDER — CELECOXIB 200 MG PO CAPS: 200.0000 mg | ORAL_CAPSULE | Freq: Two times a day (BID) | ORAL | 1 refills | Status: AC

## 2022-05-10 MED ORDER — OXYCODONE-ACETAMINOPHEN 5-325 MG PO TABS
ORAL_TABLET | ORAL | Status: AC
  Filled 2022-05-10: qty 2

## 2022-05-10 MED ORDER — CLOPIDOGREL BISULFATE 75 MG PO TABS
ORAL_TABLET | ORAL | Status: AC
  Filled 2022-05-10: qty 1

## 2022-05-10 MED ORDER — OXYCODONE HCL 5 MG PO TABS: 5.0000 mg | ORAL_TABLET | ORAL | 0 refills | Status: AC | PRN

## 2022-05-10 NOTE — Progress Notes (Signed)
Subjective: 1 [place] Post-Op Procedure(s) (LRB): COMPUTER ASSISTED TOTAL KNEE ARTHROPLASTY - RNFA (Right) Patient reports pain as mild.   Patient is well, and has had no acute complaints or problems Plan is to go Home after hospital stay. Negative for chest pain and shortness of breath Fever: no Gastrointestinal: Negative for nausea and vomiting  Objective: Vital signs in last 24 hours: Temp:  [97.3 F (36.3 C)-98.9 F (37.2 C)] 97.6 F (36.4 C) (04/18 0324) Pulse Rate:  [60-95] 60 (04/18 0324) Resp:  [7-23] 18 (04/18 0324) BP: (100-135)/(58-92) 128/81 (04/18 0324) SpO2:  [89 %-98 %] 98 % (04/18 0324)  Intake/Output from previous [facility]:  Intake/Output Summary (Last 24 hours) at 05/10/2022 0708 Last data filed at 05/10/2022 0530 Gross per 24 hour  Intake 1530.56 ml  Output 850 ml  Net 680.56 ml    Intake/Output this shift: No intake/output data recorded.  Labs: No results for input(s): "HGB" in the last 72 hours. No results for input(s): "WBC", "RBC", "HCT", "PLT" in the last 72 hours. No results for input(s): "NA", "K", "CL", "CO2", "BUN", "CREATININE", "GLUCOSE", "CALCIUM" in the last 72 hours. No results for input(s): "LABPT", "INR" in the last 72 hours.   EXAM General - Patient is Alert and Oriented Extremity - Neurovascular intact Sensation intact distally Dorsiflexion/Plantar flexion intact Compartment soft Dressing/Incision - clean, dry, with the Hemovac removed with no complication.  The Hemovac tubing was intact on removal. Motor Function - intact, moving foot and toes well on exam.  Able to straight leg raise independently  Past Medical History:  Diagnosis Date   Anxiety    Arthritis    Back pain    CAD (coronary artery disease)    a.) NSTEMI 01/07/2015; b.) LHC/PCI 01/10/2015: LM nl, LAD 85p/m (overlapping 2.75 x 22 mm (pLAD), 2.5 x 22 mm (mLAD), and 2.75 x 12 mm (mLAD) Resolute Integrity DES), D1 50ost, D2/3 small/nl, LCX 60d, OM1 nl, OM2 min irregs, OM3  nl, RCA 141m (likely culprit w/ L->R, R->R collats-->Med Rx). Nl EF.   Complication of anesthesia 2010   a.) cardiac arrest in OR during back surgery done in [place]; "never told me why"   Depressive disorder, not elsewhere classified    GERD (gastroesophageal reflux disease)    Gout    History of echocardiogram    a. 12/2014 Echo: EF 60-65%, no rwma, mild MR. Nl RV fxn.   Hyperlipidemia    Hypertension    Lipoma of back 02/09/2015   Long term current use of antithrombotics/antiplatelets    a.) on DAPT (ASA + clopidogrel)   Measles    Mobitz (type) II atrioventricular block    Mumps    NSTEMI (non-ST elevated myocardial infarction) 01/07/2015   a.) troponins were trended: 0.63 --> 3.16 --> 10.08 --> 9.73; b.) LHC/PCI 01/10/2015 --> overlapping 2.75 x 22 mm (pLAD), 2.5 x 22 mm (mLAD), and 2.75 x 12 mm (mLAD) Resolute Integrity DES   Obesity    OSA treated with BiPAP    Other hemochromatosis 12/05/2016    Assessment/Plan: 1 [place] Post-Op Procedure(s) (LRB): COMPUTER ASSISTED TOTAL KNEE ARTHROPLASTY - RNFA (Right) Principal Problem:   Total knee replacement status  Estimated body mass index is 36.62 kg/m as calculated from the following:   Height as of this encounter: 6' (1.829 m).   Weight as of this encounter: 122.5 kg. Advance diet Up with therapy D/C IV fluids Discharge home with home health  DVT Prophylaxis - Foot Pumps, TED hose, and Plavix Weight-Bearing as  tolerated to right leg  Dedra Skeens, PA-C Orthopaedic Surgery 05/10/2022, 7:08 AM

## 2022-05-10 NOTE — Discharge Summary (Signed)
Physician Discharge Summary  Subjective: 1 [place] Post-Op Procedure(s) (LRB): COMPUTER ASSISTED TOTAL KNEE ARTHROPLASTY - RNFA (Right) Patient reports pain as mild.   Patient seen in rounds with Dr. Ernest Pine. Patient is well, and has had no acute complaints or problems Patient is ready to go home with home health physical therapy  Physician Discharge Summary  Patient ID: Robert Greer MRN: [id number] DOB/AGE: 05/24/1955 67 y.o.  Admit date: 05/09/2022 Discharge date: 05/10/2022  Admission Diagnoses:  Discharge Diagnoses:  Principal Problem:   Total knee replacement status   Discharged Condition: good  Hospital Course: The patient is postop Robert Greer 1 from a right total knee arthroplasty.  He did physical therapy and ambulated 100 feet on the [place] of surgery.  His pain is well-controlled.  The patient had his Hemovac removed this morning.  He will do physical therapy before discharging home.  Treatments: surgery:   Right total knee arthroplasty using computer-assisted navigation   SURGEON:  Jena Gauss. M.D.   ANESTHESIA: general   ESTIMATED BLOOD LOSS: 50 mL   FLUIDS REPLACED: 900 mL of crystalloid   TOURNIQUET TIME: 101 minutes   DRAINS: 2 medium Hemovac drains   SOFT TISSUE RELEASES: Anterior cruciate ligament, posterior cruciate ligament, deep medial collateral ligament, patellofemoral ligament   IMPLANTS UTILIZED: DePuy Attune size 7 posterior stabilized femoral component (cemented), size 8 rotating platform tibial component (cemented), 41 mm medialized dome patella (cemented), and a 5 mm stabilized rotating platform polyethylene insert.  Discharge Exam: Blood pressure 128/81, pulse 60, temperature 97.6 F (36.4 C), temperature source Temporal, resp. rate 18, height 6' (1.829 m), weight 122.5 kg, SpO2 98 %.   Disposition: Discharge disposition: 01-Home or Self Care        Allergies as of 05/10/2022       Reactions   Levaquin [levofloxacin] Rash         Medication List     TAKE these medications    aspirin 81 MG chewable tablet Chew 1 tablet (81 mg total) by mouth in the morning and at bedtime. What changed: when to take this   atorvastatin 20 MG tablet Commonly known as: LIPITOR Take 1 tablet (20 mg total) by mouth daily.   buPROPion 150 MG 24 hr tablet Commonly known as: Wellbutrin XL Take 1 tablet (150 mg total) by mouth daily.   celecoxib 200 MG capsule Commonly known as: CELEBREX Take 1 capsule (200 mg total) by mouth 2 (two) times daily. What changed: when to take this   cetirizine 10 MG tablet Commonly known as: ZYRTEC Take 1 tablet (10 mg total) by mouth as needed for allergies. Reported on 02/09/2015   clopidogrel 75 MG tablet Commonly known as: PLAVIX Take 1 tablet (75 mg total) by mouth daily.   colchicine 0.6 MG tablet Take 2 tabs PO at the first sign of a gout attack followed by 1 tab PO 1 hour later.   diclofenac Sodium 1 % Gel Commonly known as: VOLTAREN Apply 2 g topically 4 (four) times daily.   DULoxetine 60 MG capsule Commonly known as: CYMBALTA Take 1 capsule (60 mg total) by mouth 2 (two) times daily. What changed: when to take this   ezetimibe 10 MG tablet Commonly known as: ZETIA Take 1 tablet (10 mg total) by mouth daily.   multivitamin capsule Take 1 capsule by mouth daily.   oxyCODONE 5 MG immediate release tablet Commonly known as: Oxy IR/ROXICODONE Take 1 tablet (5 mg total) by mouth every 4 (four) hours  as needed for moderate pain (pain score 4-6).   oxyCODONE-acetaminophen 10-325 MG tablet Commonly known as: PERCOCET Take 1 tablet by mouth 2 (two) times daily.   predniSONE 20 MG tablet Commonly known as: DELTASONE Take 1 tablet (20 mg total) by mouth daily with breakfast.   pregabalin 75 MG capsule Commonly known as: LYRICA Take 75 mg by mouth 2 (two) times daily. 1 tab in the morning and 2 at bedtime   traMADol 50 MG tablet Commonly known as: ULTRAM Take 1-2 tablets  (50-100 mg total) by mouth every 4 (four) hours as needed for moderate pain.   triamcinolone 55 MCG/ACT Aero nasal inhaler Commonly known as: NASACORT Place 2 sprays into the nose daily. What changed:  when to take this reasons to take this   valsartan-hydrochlorothiazide 160-12.5 MG tablet Commonly known as: DIOVAN-HCT Take 1 tablet by mouth daily. What changed: when to take this               Durable Medical Equipment  (From admission, onward)           Start     Ordered   05/09/22 1426  DME Walker rolling  Once       Question:  Patient needs a walker to treat with the following condition  Answer:  Total knee replacement status   05/09/22 1425   05/09/22 1426  DME Bedside commode  Once       Comments: Patient is not able to walk the distance required to go the bathroom, or he/she is unable to safely negotiate stairs required to access the bathroom.  A 3in1 BSC will alleviate this problem  Question:  Patient needs a bedside commode to treat with the following condition  Answer:  Total knee replacement status   05/09/22 1425            Follow-up Information     Tera Partridge, PA Follow up on 05/23/2022.   Specialty: Physician Assistant Why: at 9:15am Contact information: [address] [place] [address] [phone]         Donato Heinz, MD Follow up on 06/21/2022.   Specialty: Orthopedic Surgery Why: at 9:15am Contact information: [address] [facility] [place] [address] [phone]                 Signed: Lenard Forth, Jammie Clink 05/10/2022, 7:13 AM   Objective: Vital signs in last 24 hours: Temp:  [97.3 F (36.3 C)-98.9 F (37.2 C)] 97.6 F (36.4 C) (04/18 0324) Pulse Rate:  [60-95] 60 (04/18 0324) Resp:  [7-23] 18 (04/18 0324) BP: (100-135)/(58-92) 128/81 (04/18 0324) SpO2:  [89 %-98 %] 98 % (04/18 0324)  Intake/Output from previous [place]:  Intake/Output Summary (Last 24 hours) at 05/10/2022 0713 Last  data filed at 05/10/2022 0530 Gross per 24 hour  Intake 1530.56 ml  Output 850 ml  Net 680.56 ml    Intake/Output this shift: No intake/output data recorded.  Labs: No results for input(s): "HGB" in the last 72 hours. No results for input(s): "WBC", "RBC", "HCT", "PLT" in the last 72 hours. No results for input(s): "NA", "K", "CL", "CO2", "BUN", "CREATININE", "GLUCOSE", "CALCIUM" in the last 72 hours. No results for input(s): "LABPT", "INR" in the last 72 hours.  EXAM: General - Patient is Alert and Oriented Extremity - Neurovascular intact Sensation intact distally Dorsiflexion/Plantar flexion intact Compartment soft Incision - clean, dry, with the Hemovac removed with no complication Motor Function -plantarflexion and dorsiflexion intact.  Able to straight leg raise independently  Assessment/Plan: 1 [place] Post-Op Procedure(s) (LRB): COMPUTER ASSISTED TOTAL KNEE ARTHROPLASTY - RNFA (Right) Procedure(s) (LRB): COMPUTER ASSISTED TOTAL KNEE ARTHROPLASTY - RNFA (Right) Past Medical History:  Diagnosis Date   Anxiety    Arthritis    Back pain    CAD (coronary artery disease)    a.) NSTEMI 01/07/2015; b.) LHC/PCI 01/10/2015: LM nl, LAD 85p/m (overlapping 2.75 x 22 mm (pLAD), 2.5 x 22 mm (mLAD), and 2.75 x 12 mm (mLAD) Resolute Integrity DES), D1 50ost, D2/3 small/nl, LCX 60d, OM1 nl, OM2 min irregs, OM3 nl, RCA 143m (likely culprit w/ L->R, R->R collats-->Med Rx). Nl EF.   Complication of anesthesia 2010   a.) cardiac arrest in OR during back surgery done in [place]; "never told me why"   Depressive disorder, not elsewhere classified    GERD (gastroesophageal reflux disease)    Gout    History of echocardiogram    a. 12/2014 Echo: EF 60-65%, no rwma, mild MR. Nl RV fxn.   Hyperlipidemia    Hypertension    Lipoma of back 02/09/2015   Long term current use of antithrombotics/antiplatelets    a.) on DAPT (ASA + clopidogrel)   Measles    Mobitz (type) II atrioventricular block     Mumps    NSTEMI (non-ST elevated myocardial infarction) 01/07/2015   a.) troponins were trended: 0.63 --> 3.16 --> 10.08 --> 9.73; b.) LHC/PCI 01/10/2015 --> overlapping 2.75 x 22 mm (pLAD), 2.5 x 22 mm (mLAD), and 2.75 x 12 mm (mLAD) Resolute Integrity DES   Obesity    OSA treated with BiPAP    Other hemochromatosis 12/05/2016   Principal Problem:   Total knee replacement status  Estimated body mass index is 36.62 kg/m as calculated from the following:   Height as of this encounter: 6' (1.829 m).   Weight as of this encounter: 122.5 kg. Advance diet Up with therapy D/C IV fluids Discharge home with home health Diet - Regular diet Follow up - in 2 weeks Activity - WBAT Disposition - Home Condition Upon Discharge - Stable DVT Prophylaxis - Aspirin, TED hose, and Plavix  Dedra Skeens, PA-C Orthopaedic Surgery 05/10/2022, 7:13 AM

## 2022-05-10 NOTE — Plan of Care (Signed)
  Problem: Activity: Goal: Risk for activity intolerance will decrease Outcome: Progressing   Problem: Elimination: Goal: Will not experience complications related to urinary retention Outcome: Progressing   Problem: Pain Managment: Goal: General experience of comfort will improve Outcome: Progressing   Problem: Safety: Goal: Ability to remain free from injury will improve Outcome: Progressing   Problem: Skin Integrity: Goal: Risk for impaired skin integrity will decrease Outcome: Progressing   

## 2022-05-10 NOTE — Progress Notes (Signed)
Physical Therapy Treatment Patient Details Name: Robert Greer Vassar MRN: [id number] DOB: [birth year] Today's Date: 05/10/2022   History of Present Illness Pt admitted for R TKR and is POD 0 at time of evaluation. History includes anxiety, arthritis, depression, GERD, HTN, and gout.    PT Comments    Pt is making good progress towards goals and is safe to dc this date. Good progress with ambulation distance progressing to reciprocal gait pattern with safe use of RW. Stair training performed and there-ex reviewed. Good progress with AAROM. Will dc in house at this time.   Recommendations for follow up therapy are one component of a multi-disciplinary discharge planning process, led by the attending physician.  Recommendations may be updated based on patient status, additional functional criteria and insurance authorization.  Follow Up Recommendations       Assistance Recommended at Discharge Set up Supervision/Assistance  Patient can return home with the following A little help with walking and/or transfers;Help with stairs or ramp for entrance   Equipment Recommendations  BSC/3in1    Recommendations for Other Services       Precautions / Restrictions Precautions Precautions: Fall;Knee Precaution Booklet Issued: Yes (comment) Restrictions Weight Bearing Restrictions: Yes RLE Weight Bearing: Weight bearing as tolerated     Mobility  Bed Mobility               General bed mobility comments: NT, received up in standing with OT    Transfers Overall transfer level: Needs assistance Equipment used: Rolling walker (2 wheels) Transfers: Sit to/from Stand Sit to Stand: Supervision           General transfer comment: safe technqiue    Ambulation/Gait Ambulation/Gait assistance: Supervision Gait Distance (Feet): 250 Feet Assistive device: Rolling walker (2 wheels) Gait Pattern/deviations: Step-through pattern       General Gait Details: improved gait pattern with  reciprocal gait. RW used with safe technique. Still rates pain at 7/10   Stairs Stairs: Yes Stairs assistance: Min guard Stair Management: No rails Number of Stairs: 6 General stair comments: PT demonstrated prior to performance. Practiced twice with safe technique and cga for bracing of RW. Up/down backwards   Wheelchair Mobility    Modified Rankin (Stroke Patients Only)       Balance Overall balance assessment: Mild deficits observed, not formally tested                                          Cognition Arousal/Alertness: Awake/alert Behavior During Therapy: WFL for tasks assessed/performed Overall Cognitive Status: Within Functional Limits for tasks assessed                                 General Comments: pleasant and agreeable to session        Exercises Total Joint Exercises Goniometric ROM: R knee AAROM: 0-90 degrees Other Exercises Other Exercises: performed seated ther-ex including 10 reps of AP, SLRs, and hip abd/ad. Also performed knee flexion stretches. Education reviewed regarding frequency/duration    General Comments        Pertinent Vitals/Pain Pain Assessment Pain Assessment: 0-10 Pain Score: 7  Pain Location: R knee with amb Pain Descriptors / Indicators: Aching Pain Intervention(s): Limited activity within patient's tolerance, Premedicated before session, Repositioned, Ice applied    Home Living Family/patient expects to be discharged to:: Private  residence Living Arrangements: Spouse/significant other Available Help at Discharge: Family;Available 24 hours/[place] Type of Home: House Home Access: Stairs to enter Entrance Stairs-Rails: None Entrance Stairs-Number of Steps: 2   Home Layout: One level Home Equipment: Agricultural consultant (2 wheels);Cane - single point;BSC/3in1;Adaptive equipment      Prior Function            PT Goals (current goals can now be found in the care plan section) Acute Rehab PT  Goals Patient Stated Goal: to go home PT Goal Formulation: With patient Time For Goal Achievement: 05/23/22 Potential to Achieve Goals: Good Progress towards PT goals: Progressing toward goals    Frequency    BID      PT Plan Current plan remains appropriate    Co-evaluation              AM-PAC PT "6 Clicks" Mobility   Outcome Measure  Help needed turning from your back to your side while in a flat bed without using bedrails?: None Help needed moving from lying on your back to sitting on the side of a flat bed without using bedrails?: A Little Help needed moving to and from a bed to a chair (including a wheelchair)?: A Little Help needed standing up from a chair using your arms (e.g., wheelchair or bedside chair)?: A Little Help needed to walk in hospital room?: A Little Help needed climbing 3-5 steps with a railing? : A Little 6 Click Score: 19    End of Session Equipment Utilized During Treatment: Gait belt Activity Tolerance: Patient tolerated treatment well Patient left: in chair Nurse Communication: Mobility status PT Visit Diagnosis: Muscle weakness (generalized) (M62.81);Difficulty in walking, not elsewhere classified (R26.2);Pain Pain - Right/Left: Right Pain - part of body: Knee     Time: 9147-8295 PT Time Calculation (min) (ACUTE ONLY): 26 min  Charges:  $Gait Training: 8-22 mins $Therapeutic Exercise: 8-22 mins                     Elizabeth Palau, PT, DPT, GCS [phone]    [place] 05/10/2022, 11:00 AM

## 2022-05-10 NOTE — Transitions of Care (Post Inpatient/ED Visit) (Signed)
   05/10/2022  Name: Robert Migliaccio Greer MRN: [id number] DOB: 1955/06/12  Today's TOC FU Call Status: Today's TOC FU Call Status:: Successful TOC FU Call Competed TOC FU Call Complete Date: 05/10/22  Transition Care Management Follow-up Telephone Call Date of Discharge: 05/10/22 Discharge Facility: [facility]) Type of Discharge: Inpatient Admission Primary Inpatient Discharge Diagnosis:: total knee replacement How have you been since you were released from the hospital?: Better Any questions or concerns?: No  Items Reviewed: Did you receive and understand the discharge instructions provided?: Yes Medications obtained and verified?: Yes (Medications Reviewed) Any new allergies since your discharge?: No Dietary orders reviewed?: NA Do you have support at home?: Yes People in Home: significant other  Home Care and Equipment/Supplies: Were Home Health Services Ordered?: Yes Has Agency set up a time to come to your home?: Yes First Home Health Visit Date: 05/11/22 Any new equipment or medical supplies ordered?: NA (has walker)  Functional Questionnaire: Do you need assistance with bathing/showering or dressing?: No Do you need assistance with meal preparation?: No Do you need assistance with eating?: No Do you have difficulty maintaining continence: No Do you need assistance with getting out of bed/getting out of a chair/moving?: No Do you have difficulty managing or taking your medications?: No  Follow up appointments reviewed: PCP Follow-up appointment confirmed?: NA Specialist Hospital Follow-up appointment confirmed?: Yes Date of Specialist follow-up appointment?: 05/23/22 Follow-Up Specialty Provider:: Van Clines, PA-Ortho Do you need transportation to your follow-up appointment?: No Do you understand care options if your condition(s) worsen?: Yes-patient verbalized understanding    SIGNATURE  Robert Greer, CMA (AAMA)  [facility] [phone]

## 2022-05-10 NOTE — Evaluation (Signed)
Occupational Therapy Evaluation Patient Details Name: Robert Greer MRN: [id number] DOB: [birth year] Today's Date: 05/10/2022   History of Present Illness Pt admitted for R TKR and is POD 0 at time of evaluation. History includes anxiety, arthritis, depression, GERD, HTN, and gout.   Clinical Impression   Pt seen for OT evaluation this date, POD#1 from above surgery. Pt was independent in all ADL prior to surgery however some activities were becoming more difficult due to R knee pain. 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 Pt is eager to return to PLOF with less pain and improved safety and independence. Pt currently requires PRN MIN assist for LB ADL while in seated position due to pain and limited AROM of R knee. Pt instructed in polar care mgt, falls prevention strategies, home/routines modifications, DME/AE for LB bathing and dressing tasks, pet care considerations, and compression stocking mgt. Handout provided. Pt verbalized understanding of all education/training provided. Do not currently anticipate any additional skilled OT needs following this hospitalization. Will sign off.    Recommendations for follow up therapy are one component of a multi-disciplinary discharge planning process, led by the attending physician.  Recommendations may be updated based on patient status, additional functional criteria and insurance authorization.   Assistance Recommended at Discharge PRN  Patient can return home with the following A little help with bathing/dressing/bathroom;Assistance with cooking/housework;Assist for transportation;Help with stairs or ramp for entrance    Functional Status Assessment  Patient has had a recent decline in their functional status and demonstrates the ability to make significant improvements in function in a reasonable and predictable amount of time.  Equipment Recommendations  None recommended by OT    Recommendations for Other  Services       Precautions / Restrictions Precautions Precautions: Fall;Knee Precaution Booklet Issued: Yes (comment) Restrictions Weight Bearing Restrictions: Yes RLE Weight Bearing: Weight bearing as tolerated      Mobility Bed Mobility               General bed mobility comments: NT, up in recliner    Transfers Overall transfer level: Needs assistance Equipment used: Rolling walker (2 wheels) Transfers: Sit to/from Stand Sit to Stand: Supervision                  Balance Overall balance assessment: Mild deficits observed, not formally tested                                         ADL either performed or assessed with clinical judgement   ADL                                         General ADL Comments: Pt requires PRN MIN A for LB ADL such as compression stockings; spouse able to assist     Vision         Perception     Praxis      Pertinent Vitals/Pain Pain Assessment Pain Assessment: 0-10 Pain Score: 7  Pain Location: R knee with amb Pain Descriptors / Indicators: Aching Pain Intervention(s): Limited activity within patient's tolerance, Monitored during session, Repositioned, Patient requesting pain meds-RN notified     Hand Dominance     Extremity/Trunk Assessment Upper Extremity Assessment Upper Extremity  Assessment: Overall WFL for tasks assessed   Lower Extremity Assessment Lower Extremity Assessment: RLE deficits/detail RLE Deficits / Details: expected post-op strength/ROM deficits       Communication Communication Communication: No difficulties   Cognition Arousal/Alertness: Awake/alert Behavior During Therapy: WFL for tasks assessed/performed Overall Cognitive Status: Within Functional Limits for tasks assessed                                       General Comments       Exercises Other Exercises Other Exercises: Pt instructed in home/routines modifications, falls  prevention, pet care considerations, AE/DME, compression stocking mgt, and polar care mgt. Handout provided.   Shoulder Instructions      Home Living Family/patient expects to be discharged to:: Private residence Living Arrangements: Spouse/significant other Available Help at Discharge: Family;Available 24 hours/[place] Type of Home: House Home Access: Stairs to enter [employer] of Steps: 2 Entrance Stairs-Rails: None Home Layout: One level     Bathroom Shower/Tub: Producer, television/film/video: Handicapped height     Home Equipment: Agricultural consultant (2 wheels);Cane - single point;BSC/3in1;Adaptive equipment Adaptive Equipment: Reacher        Prior Functioning/Environment Prior Level of Function : Independent/Modified Independent;Working/employed;Driving;History of Falls (last six months)             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 Reports 1 recent fall. Indep prior ADLs Comments: indep        OT Problem List: Decreased strength;Decreased range of motion;Pain      OT Treatment/Interventions:      OT Goals(Current goals can be found in the care plan section) Acute Rehab OT Goals Patient Stated Goal: go home and be independent OT Goal Formulation: All assessment and education complete, DC therapy  OT Frequency:      Co-evaluation              AM-PAC OT "6 Clicks" Daily Activity     Outcome Measure Help from another person eating meals?: None Help from another person taking care of personal grooming?: None Help from another person toileting, which includes using toliet, bedpan, or urinal?: None Help from another person bathing (including washing, rinsing, drying)?: A Little Help from another person to put on and taking off regular upper body clothing?: None Help from another person to put on and taking off regular lower body clothing?: A Little 6 Click Score: 22   End of Session Equipment Utilized  During Treatment: Gait belt;Rolling walker (2 wheels) Nurse Communication: Patient requests pain meds;Mobility status  Activity Tolerance: Patient tolerated treatment well Patient left: Other (comment) (standing with RW in hallway wiht PT)  OT Visit Diagnosis: Other abnormalities of gait and mobility (R26.89);Pain Pain - Right/Left: Right Pain - part of body: Knee                Time: 8295-6213 OT Time Calculation (min): 18 min Charges:  OT General Charges $OT Visit: 1 Visit OT Evaluation $OT Eval Low Complexity: 1 Low OT Treatments $Self Care/Home Management : 8-22 mins  Arman Filter., MPH, MS, OTR/L ascom [phone] 05/10/22, 9:33 AM

## 2022-05-10 NOTE — Progress Notes (Signed)
DISCHARGE NOTE:  Pt given discharge instructions and verbalized understanding. TED hose on both legs, extra honeycomb sent with pt. Walker, polar care and bone foam sent with pt. Pt wheeled to car by staff, wife providing transportation.

## 2022-05-10 NOTE — Progress Notes (Signed)
Patient is not able to walk the distance required to go the bathroom, or he/she is unable to safely negotiate stairs required to access the bathroom.  A 3in1 BSC will alleviate this problem  

## 2022-05-11 ENCOUNTER — Other Ambulatory Visit: Payer: Self-pay | Admitting: Cardiovascular Disease

## 2022-05-11 DIAGNOSIS — K219 Gastro-esophageal reflux disease without esophagitis: Secondary | ICD-10-CM | POA: Diagnosis not present

## 2022-05-11 DIAGNOSIS — M545 Low back pain, unspecified: Secondary | ICD-10-CM | POA: Diagnosis not present

## 2022-05-11 DIAGNOSIS — I252 Old myocardial infarction: Secondary | ICD-10-CM | POA: Diagnosis not present

## 2022-05-11 DIAGNOSIS — F329 Major depressive disorder, single episode, unspecified: Secondary | ICD-10-CM | POA: Diagnosis not present

## 2022-05-11 DIAGNOSIS — Z6837 Body mass index (BMI) 37.0-37.9, adult: Secondary | ICD-10-CM | POA: Diagnosis not present

## 2022-05-11 DIAGNOSIS — Z96611 Presence of right artificial shoulder joint: Secondary | ICD-10-CM | POA: Diagnosis not present

## 2022-05-11 DIAGNOSIS — Z96651 Presence of right artificial knee joint: Secondary | ICD-10-CM | POA: Diagnosis not present

## 2022-05-11 DIAGNOSIS — E785 Hyperlipidemia, unspecified: Secondary | ICD-10-CM | POA: Diagnosis not present

## 2022-05-11 DIAGNOSIS — Z955 Presence of coronary angioplasty implant and graft: Secondary | ICD-10-CM | POA: Diagnosis not present

## 2022-05-11 DIAGNOSIS — I2510 Atherosclerotic heart disease of native coronary artery without angina pectoris: Secondary | ICD-10-CM | POA: Diagnosis not present

## 2022-05-11 DIAGNOSIS — F419 Anxiety disorder, unspecified: Secondary | ICD-10-CM | POA: Diagnosis not present

## 2022-05-11 DIAGNOSIS — I441 Atrioventricular block, second degree: Secondary | ICD-10-CM | POA: Diagnosis not present

## 2022-05-11 DIAGNOSIS — M1990 Unspecified osteoarthritis, unspecified site: Secondary | ICD-10-CM | POA: Diagnosis not present

## 2022-05-11 DIAGNOSIS — Z9181 History of falling: Secondary | ICD-10-CM | POA: Diagnosis not present

## 2022-05-11 DIAGNOSIS — G4733 Obstructive sleep apnea (adult) (pediatric): Secondary | ICD-10-CM | POA: Diagnosis not present

## 2022-05-11 DIAGNOSIS — I10 Essential (primary) hypertension: Secondary | ICD-10-CM | POA: Diagnosis not present

## 2022-05-11 DIAGNOSIS — G8929 Other chronic pain: Secondary | ICD-10-CM | POA: Diagnosis not present

## 2022-05-11 DIAGNOSIS — Z471 Aftercare following joint replacement surgery: Secondary | ICD-10-CM | POA: Diagnosis not present

## 2022-05-11 DIAGNOSIS — Z9989 Dependence on other enabling machines and devices: Secondary | ICD-10-CM | POA: Diagnosis not present

## 2022-05-11 DIAGNOSIS — M109 Gout, unspecified: Secondary | ICD-10-CM | POA: Diagnosis not present

## 2022-05-11 DIAGNOSIS — Z7902 Long term (current) use of antithrombotics/antiplatelets: Secondary | ICD-10-CM | POA: Diagnosis not present

## 2022-05-11 DIAGNOSIS — Z7982 Long term (current) use of aspirin: Secondary | ICD-10-CM | POA: Diagnosis not present

## 2022-05-11 DIAGNOSIS — E669 Obesity, unspecified: Secondary | ICD-10-CM | POA: Diagnosis not present

## 2022-05-13 DIAGNOSIS — I2510 Atherosclerotic heart disease of native coronary artery without angina pectoris: Secondary | ICD-10-CM | POA: Diagnosis not present

## 2022-05-13 DIAGNOSIS — I441 Atrioventricular block, second degree: Secondary | ICD-10-CM | POA: Diagnosis not present

## 2022-05-13 DIAGNOSIS — F329 Major depressive disorder, single episode, unspecified: Secondary | ICD-10-CM | POA: Diagnosis not present

## 2022-05-13 DIAGNOSIS — I10 Essential (primary) hypertension: Secondary | ICD-10-CM | POA: Diagnosis not present

## 2022-05-13 DIAGNOSIS — Z96651 Presence of right artificial knee joint: Secondary | ICD-10-CM | POA: Diagnosis not present

## 2022-05-13 DIAGNOSIS — Z471 Aftercare following joint replacement surgery: Secondary | ICD-10-CM | POA: Diagnosis not present

## 2022-05-15 DIAGNOSIS — F329 Major depressive disorder, single episode, unspecified: Secondary | ICD-10-CM | POA: Diagnosis not present

## 2022-05-15 DIAGNOSIS — I441 Atrioventricular block, second degree: Secondary | ICD-10-CM | POA: Diagnosis not present

## 2022-05-15 DIAGNOSIS — I2510 Atherosclerotic heart disease of native coronary artery without angina pectoris: Secondary | ICD-10-CM | POA: Diagnosis not present

## 2022-05-15 DIAGNOSIS — Z471 Aftercare following joint replacement surgery: Secondary | ICD-10-CM | POA: Diagnosis not present

## 2022-05-15 DIAGNOSIS — Z96651 Presence of right artificial knee joint: Secondary | ICD-10-CM | POA: Diagnosis not present

## 2022-05-15 DIAGNOSIS — I10 Essential (primary) hypertension: Secondary | ICD-10-CM | POA: Diagnosis not present

## 2022-05-18 DIAGNOSIS — I2510 Atherosclerotic heart disease of native coronary artery without angina pectoris: Secondary | ICD-10-CM | POA: Diagnosis not present

## 2022-05-18 DIAGNOSIS — Z96651 Presence of right artificial knee joint: Secondary | ICD-10-CM | POA: Diagnosis not present

## 2022-05-18 DIAGNOSIS — I10 Essential (primary) hypertension: Secondary | ICD-10-CM | POA: Diagnosis not present

## 2022-05-18 DIAGNOSIS — I441 Atrioventricular block, second degree: Secondary | ICD-10-CM | POA: Diagnosis not present

## 2022-05-18 DIAGNOSIS — Z471 Aftercare following joint replacement surgery: Secondary | ICD-10-CM | POA: Diagnosis not present

## 2022-05-18 DIAGNOSIS — F329 Major depressive disorder, single episode, unspecified: Secondary | ICD-10-CM | POA: Diagnosis not present

## 2022-05-20 DIAGNOSIS — Z471 Aftercare following joint replacement surgery: Secondary | ICD-10-CM | POA: Diagnosis not present

## 2022-05-20 DIAGNOSIS — F329 Major depressive disorder, single episode, unspecified: Secondary | ICD-10-CM | POA: Diagnosis not present

## 2022-05-20 DIAGNOSIS — I10 Essential (primary) hypertension: Secondary | ICD-10-CM | POA: Diagnosis not present

## 2022-05-20 DIAGNOSIS — I2510 Atherosclerotic heart disease of native coronary artery without angina pectoris: Secondary | ICD-10-CM | POA: Diagnosis not present

## 2022-05-20 DIAGNOSIS — I441 Atrioventricular block, second degree: Secondary | ICD-10-CM | POA: Diagnosis not present

## 2022-05-20 DIAGNOSIS — Z96651 Presence of right artificial knee joint: Secondary | ICD-10-CM | POA: Diagnosis not present

## 2022-05-21 ENCOUNTER — Other Ambulatory Visit: Payer: Self-pay | Admitting: Internal Medicine

## 2022-05-21 MED ORDER — COLCHICINE 0.6 MG PO TABS: ORAL_TABLET | ORAL | 0 refills | Status: DC

## 2022-05-22 ENCOUNTER — Encounter: Payer: Self-pay | Admitting: Orthopedic Surgery

## 2022-05-22 DIAGNOSIS — I2510 Atherosclerotic heart disease of native coronary artery without angina pectoris: Secondary | ICD-10-CM | POA: Diagnosis not present

## 2022-05-22 DIAGNOSIS — F329 Major depressive disorder, single episode, unspecified: Secondary | ICD-10-CM | POA: Diagnosis not present

## 2022-05-22 DIAGNOSIS — I10 Essential (primary) hypertension: Secondary | ICD-10-CM | POA: Diagnosis not present

## 2022-05-22 DIAGNOSIS — Z96651 Presence of right artificial knee joint: Secondary | ICD-10-CM | POA: Diagnosis not present

## 2022-05-22 DIAGNOSIS — Z471 Aftercare following joint replacement surgery: Secondary | ICD-10-CM | POA: Diagnosis not present

## 2022-05-22 DIAGNOSIS — I441 Atrioventricular block, second degree: Secondary | ICD-10-CM | POA: Diagnosis not present

## 2022-05-23 DIAGNOSIS — M25561 Pain in right knee: Secondary | ICD-10-CM | POA: Diagnosis not present

## 2022-05-23 DIAGNOSIS — M25461 Effusion, right knee: Secondary | ICD-10-CM | POA: Diagnosis not present

## 2022-05-25 ENCOUNTER — Other Ambulatory Visit: Payer: Self-pay

## 2022-05-25 DIAGNOSIS — M25461 Effusion, right knee: Secondary | ICD-10-CM | POA: Diagnosis not present

## 2022-05-25 DIAGNOSIS — M25561 Pain in right knee: Secondary | ICD-10-CM | POA: Diagnosis not present

## 2022-05-25 MED ORDER — VALSARTAN-HYDROCHLOROTHIAZIDE 160-12.5 MG PO TABS: 1.0000 | ORAL_TABLET | Freq: Every day | ORAL | 1 refills | Status: DC

## 2022-05-28 DIAGNOSIS — M25461 Effusion, right knee: Secondary | ICD-10-CM | POA: Diagnosis not present

## 2022-05-28 DIAGNOSIS — M25561 Pain in right knee: Secondary | ICD-10-CM | POA: Diagnosis not present

## 2022-05-30 ENCOUNTER — Telehealth: Payer: Self-pay | Admitting: Internal Medicine

## 2022-05-30 NOTE — Telephone Encounter (Signed)
Contacted Robert Greer to schedule their annual wellness visit. Appointment made for 06/15/2022.   Verlee Rossetti; Care Guide Ambulatory Clinical Support [place] [facility] Direct Dial: [phone]

## 2022-06-01 ENCOUNTER — Ambulatory Visit: Payer: Medicare Other | Admitting: Internal Medicine

## 2022-06-01 DIAGNOSIS — M25461 Effusion, right knee: Secondary | ICD-10-CM | POA: Diagnosis not present

## 2022-06-01 DIAGNOSIS — M25561 Pain in right knee: Secondary | ICD-10-CM | POA: Diagnosis not present

## 2022-06-06 DIAGNOSIS — M25461 Effusion, right knee: Secondary | ICD-10-CM | POA: Diagnosis not present

## 2022-06-06 DIAGNOSIS — M25561 Pain in right knee: Secondary | ICD-10-CM | POA: Diagnosis not present

## 2022-06-08 DIAGNOSIS — M25561 Pain in right knee: Secondary | ICD-10-CM | POA: Diagnosis not present

## 2022-06-08 DIAGNOSIS — M25461 Effusion, right knee: Secondary | ICD-10-CM | POA: Diagnosis not present

## 2022-06-15 ENCOUNTER — Ambulatory Visit (INDEPENDENT_AMBULATORY_CARE_PROVIDER_SITE_OTHER): Payer: Medicare Other

## 2022-06-15 VITALS — Ht 72.0 in | Wt 270.0 lb

## 2022-06-15 DIAGNOSIS — Z0000 Encounter for general adult medical examination without abnormal findings: Secondary | ICD-10-CM

## 2022-06-15 NOTE — Patient Instructions (Signed)
Robert Greer , Thank you for taking time to come for your Medicare Wellness Visit. I appreciate your ongoing commitment to your health goals. Please review the following plan we discussed and let me know if I can assist you in the future.   These are the goals we discussed:  Goals       Acknowledge receipt of Advanced Directive package     Bring documents to clinic to place on file     DIET - EAT MORE FRUITS AND VEGETABLES     Obtain Annual Eye Exam     Patient Stated     04/12/2020, wants to weigh 225 pounds     PharmD - Medication Assistance     CARE PLAN ENTRY (see longitudinal plan of care for additional care plan information)  Current Barriers:  Financial Barriers in complicated patient with multiple medical conditions including CAD, HTN, HLD, arthritis, sleep apnea, and anxiety  patient has [employer] PDP Part D plan and reports copay for Zetia is cost prohibitive at this time  Pharmacist Clinical Goal(s):  Over the next 30 days, patient will work with PharmD and providers to relieve medication access concerns  Interventions: Counseled on Medicare Part D plan and medication assistance options Zetia: Patient reported meeting income criteria for [employer], which is currently Open Again provide patient with contact information for [employer] Schedule appointment with patient to complete medication review and follow up regarding medication assistance  Patient unable to continue conversation today as he is at work. Inter-disciplinary care team collaboration (see longitudinal plan of care)  Patient Self Care Activities:  Patient will provide necessary portions of application   Please see past updates related to this goal by clicking on the "Past Updates" button in the selected goal          This is a list of the screening recommended for you and due dates:  Health Maintenance  Topic Date Due   Zoster  (Shingles) Vaccine (1 of 2) Never done   COVID-19 Vaccine (3 - Moderna risk series) 05/01/2019   Flu Shot  08/23/2022   Medicare Annual Wellness Visit  06/15/2023   Colon Cancer Screening  02/15/2027   DTaP/Tdap/Td vaccine (3 - Td or Tdap) 02/05/2028   Pneumonia Vaccine  Completed   Hepatitis C Screening  Completed   HPV Vaccine  Aged Out    Advanced directives: no  Conditions/risks identified: none  Next appointment: Follow up in one year for your annual wellness visit. 06/21/23 @ 2:00 pm by phone  Preventive Care 65 Years and Older, Male  Preventive care refers to lifestyle choices and visits with your health care provider that can promote health and wellness. What does preventive care include? A yearly physical exam. This is also called an annual well check. Dental exams once or twice a year. Routine eye exams. Ask your health care provider how often you should have your eyes checked. Personal lifestyle choices, including: Daily care of your teeth and gums. Regular physical activity. Eating a healthy diet. Avoiding tobacco and drug use. Limiting alcohol use. Practicing safe sex. Taking low doses of aspirin every [place]. Taking vitamin and mineral supplements as recommended by your health care provider. What happens during an annual well check? The services and screenings done by your health care provider during your annual well check will depend on your age, overall health, lifestyle risk factors, and family history of disease. Counseling  Your health care provider may  ask you questions about your: Alcohol use. Tobacco use. Drug use. Emotional well-being. Home and relationship well-being. Sexual activity. Eating habits. History of falls. Memory and ability to understand (cognition). Work and work Astron[employer]. Screening  You may have the following tests or measurements: Height, weight, and BMI. Blood pressure. Lipid and cholesterol levels. These may be checked every 5  years, or more frequently if you are over 40 years old. Skin check. Lung cancer screening. You may have this screening every year starting at age 86 if you have a 30-pack-year history of smoking and currently smoke or have quit within the past 15 years. Fecal occult blood test (FOBT) of the stool. You may have this test every year starting at age 30. Flexible sigmoidoscopy or colonoscopy. You may have a sigmoidoscopy every 5 years or a colonoscopy every 10 years starting at age 27. Prostate cancer screening. Recommendations will vary depending on your family history and other risks. Hepatitis C blood test. Hepatitis B blood test. Sexually transmitted disease (STD) testing. Diabetes screening. This is done by checking your blood sugar (glucose) after you have not eaten for a while (fasting). You may have this done every 1-3 years. Abdominal aortic aneurysm (AAA) screening. You may need this if you are a current or former smoker. Osteoporosis. You may be screened starting at age 4 if you are at high risk. Talk with your health care provider about your test results, treatment options, and if necessary, the need for more tests. Vaccines  Your health care provider may recommend certain vaccines, such as: Influenza vaccine. This is recommended every year. Tetanus, diphtheria, and acellular pertussis (Tdap, Td) vaccine. You may need a Td booster every 10 years. Zoster vaccine. You may need this after age 67. Pneumococcal 13-valent conjugate (PCV13) vaccine. One dose is recommended after age 46. Pneumococcal polysaccharide (PPSV23) vaccine. One dose is recommended after age 75. Talk to your health care provider about which screenings and vaccines you need and how often you need them. This information is not intended to replace advice given to you by your health care provider. Make sure you discuss any questions you have with your health care provider. Document Released: 02/04/2015 Document Revised:  09/28/2015 Document Reviewed: 11/09/2014 Elsevier Interactive Patient Education  2017 [employer].  Fall Prevention in the Home Falls can cause injuries. They can happen to people of all ages. There are many things you can do to make your home safe and to help prevent falls. What can I do on the outside of my home? Regularly fix the edges of walkways and driveways and fix any cracks. Remove anything that might make you trip as you walk through a door, such as a raised step or threshold. Trim any bushes or trees on the path to your home. Use bright outdoor lighting. Clear any walking paths of anything that might make someone trip, such as rocks or tools. Regularly check to see if handrails are loose or broken. Make sure that both sides of any steps have handrails. Any raised decks and porches should have guardrails on the edges. Have any leaves, snow, or ice cleared regularly. Use sand or salt on walking paths during winter. Clean up any spills in your garage right away. This includes oil or grease spills. What can I do in the bathroom? Use night lights. Install grab bars by the toilet and in the tub and shower. Do not use towel bars as grab bars. Use non-skid mats or decals in the tub or shower.  If you need to sit down in the shower, use a plastic, non-slip stool. Keep the floor dry. Clean up any water that spills on the floor as soon as it happens. Remove soap buildup in the tub or shower regularly. Attach bath mats securely with double-sided non-slip rug tape. Do not have throw rugs and other things on the floor that can make you trip. What can I do in the bedroom? Use night lights. Make sure that you have a light by your bed that is easy to reach. Do not use any sheets or blankets that are too big for your bed. They should not hang down onto the floor. Have a firm chair that has side arms. You can use this for support while you get dressed. Do not have throw rugs and other things  on the floor that can make you trip. What can I do in the kitchen? Clean up any spills right away. Avoid walking on wet floors. Keep items that you use a lot in easy-to-reach places. If you need to reach something above you, use a strong step stool that has a grab bar. Keep electrical cords out of the way. Do not use floor polish or wax that makes floors slippery. If you must use wax, use non-skid floor wax. Do not have throw rugs and other things on the floor that can make you trip. What can I do with my stairs? Do not leave any items on the stairs. Make sure that there are handrails on both sides of the stairs and use them. Fix handrails that are broken or loose. Make sure that handrails are as long as the stairways. Check any carpeting to make sure that it is firmly attached to the stairs. Fix any carpet that is loose or worn. Avoid having throw rugs at the top or bottom of the stairs. If you do have throw rugs, attach them to the floor with carpet tape. Make sure that you have a light switch at the top of the stairs and the bottom of the stairs. If you do not have them, ask someone to add them for you. What else can I do to help prevent falls? Wear shoes that: Do not have high heels. Have rubber bottoms. Are comfortable and fit you well. Are closed at the toe. Do not wear sandals. If you use a stepladder: Make sure that it is fully opened. Do not climb a closed stepladder. Make sure that both sides of the stepladder are locked into place. Ask someone to hold it for you, if possible. Clearly mark and make sure that you can see: Any grab bars or handrails. First and last steps. Where the edge of each step is. Use tools that help you move around (mobility aids) if they are needed. These include: Canes. Walkers. Scooters. Crutches. Turn on the lights when you go into a dark area. Replace any light bulbs as soon as they burn out. Set up your furniture so you have a clear path. Avoid  moving your furniture around. If any of your floors are uneven, fix them. If there are any pets around you, be aware of where they are. Review your medicines with your doctor. Some medicines can make you feel dizzy. This can increase your chance of falling. Ask your doctor what other things that you can do to help prevent falls. This information is not intended to replace advice given to you by your health care provider. Make sure you discuss any questions you have  with your health care provider. Document Released: 11/04/2008 Document Revised: 06/16/2015 Document Reviewed: 02/12/2014 Elsevier Interactive Patient Education  2017 [employer].

## 2022-06-15 NOTE — Progress Notes (Signed)
I connected with  Tayten Chubb Bergeron on 06/15/22 by a audio enabled telemedicine application and verified that I am speaking with the correct person using two identifiers.  Patient Location: Home  Provider Location: Office/Clinic  I discussed the limitations of evaluation and management by telemedicine. The patient expressed understanding and agreed to proceed.  Subjective:   Bemnet Caltrider Shepherd is a 67 y.o. male who presents for Medicare Annual/Subsequent preventive examination.  Review of Systems     Cardiac Risk Factors include: advanced age (>90men, >52 women);dyslipidemia;hypertension;obesity (BMI >30kg/m2);smoking/ tobacco exposure     Objective:    There were no vitals filed for this visit. There is no height or weight on file to calculate BMI.     06/15/2022    3:41 PM 05/09/2022    5:00 PM 05/09/2022    6:25 AM 05/01/2022    2:40 PM 12/29/2021    1:52 PM 04/12/2020    1:33 PM 08/21/2018    4:03 PM  Advanced Directives  Does Patient Have a Medical Advance Directive? No Yes Yes Yes No Yes Yes  Type of Special educational needs teacher of [place];Living will    Healthcare Power of [place];Living will Healthcare Power of [place];Living will  Does patient want to make changes to medical advance directive?  No - Patient declined No - Patient declined    Yes (ED - Information included in AVS)  Copy of Healthcare Power of Attorney in Chart?  No - copy requested    No - copy requested No - copy requested  Would patient like information on creating a medical advance directive? No - Patient declined          Current Medications (verified) Outpatient Encounter Medications as of 06/15/2022  Medication Sig   aspirin 81 MG chewable tablet Chew 1 tablet (81 mg total) by mouth in the morning and at bedtime.   atorvastatin (LIPITOR) 20 MG tablet Take 1 tablet (20 mg total) by mouth daily.   buPROPion (WELLBUTRIN XL) 150 MG 24 hr tablet Take 1 tablet (150 mg total) by mouth daily.   celecoxib  (CELEBREX) 200 MG capsule Take 1 capsule (200 mg total) by mouth 2 (two) times daily.   cetirizine (ZYRTEC) 10 MG tablet Take 1 tablet (10 mg total) by mouth as needed for allergies. Reported on 02/09/2015   clopidogrel (PLAVIX) 75 MG tablet Take 1 tablet (75 mg total) by mouth daily.   colchicine 0.6 MG tablet Take 2 tabs PO at the first sign of a gout attack followed by 1 tab PO 1 hour later.   diclofenac Sodium (VOLTAREN) 1 % GEL Apply 2 g topically 4 (four) times daily.   DULoxetine (CYMBALTA) 60 MG capsule Take 1 capsule (60 mg total) by mouth 2 (two) times daily. (Patient taking differently: Take 60 mg by mouth every morning.)   ezetimibe (ZETIA) 10 MG tablet Take 1 tablet (10 mg total) by mouth daily.   Multiple Vitamin (MULTIVITAMIN) capsule Take 1 capsule by mouth daily.   oxyCODONE (OXY IR/ROXICODONE) 5 MG immediate release tablet Take 1 tablet (5 mg total) by mouth every 4 (four) hours as needed for moderate pain (pain score 4-6).   oxyCODONE-acetaminophen (PERCOCET) 10-325 MG tablet Take 1 tablet by mouth 2 (two) times daily.   triamcinolone (NASACORT) 55 MCG/ACT AERO nasal inhaler Place 2 sprays into the nose daily. (Patient taking differently: Place 2 sprays into the nose as needed.)   valsartan-hydrochlorothiazide (DIOVAN-HCT) 160-12.5 MG tablet Take 1 tablet by mouth daily.  predniSONE (DELTASONE) 20 MG tablet Take 1 tablet (20 mg total) by mouth daily with breakfast. (Patient not taking: Reported on 06/15/2022)   pregabalin (LYRICA) 75 MG capsule Take 75 mg by mouth 2 (two) times daily. 1 tab in the morning and 2 at bedtime (Patient not taking: Reported on 06/15/2022)   traMADol (ULTRAM) 50 MG tablet Take 1-2 tablets (50-100 mg total) by mouth every 4 (four) hours as needed for moderate pain. (Patient not taking: Reported on 06/15/2022)   No facility-administered encounter medications on file as of 06/15/2022.    Allergies (verified) Levaquin [levofloxacin]   History: Past Medical  History:  Diagnosis Date   Anxiety    Arthritis    Back pain    CAD (coronary artery disease)    a.) NSTEMI 01/07/2015; b.) LHC/PCI 01/10/2015: LM nl, LAD 85p/m (overlapping 2.75 x 22 mm (pLAD), 2.5 x 22 mm (mLAD), and 2.75 x 12 mm (mLAD) Resolute Integrity DES), D1 50ost, D2/3 small/nl, LCX 60d, OM1 nl, OM2 min irregs, OM3 nl, RCA 169m (likely culprit w/ L->R, R->R collats-->Med Rx). Nl EF.   Complication of anesthesia 2010   a.) cardiac arrest in OR during back surgery done in [place]; "never told me why"   Depressive disorder, not elsewhere classified    GERD (gastroesophageal reflux disease)    Gout    History of echocardiogram    a. 12/2014 Echo: EF 60-65%, no rwma, mild MR. Nl RV fxn.   Hyperlipidemia    Hypertension    Lipoma of back 02/09/2015   Long term current use of antithrombotics/antiplatelets    a.) on DAPT (ASA + clopidogrel)   Measles    Mobitz (type) II atrioventricular block    Mumps    NSTEMI (non-ST elevated myocardial infarction) (HCC) 01/07/2015   a.) troponins were trended: 0.63 --> 3.16 --> 10.08 --> 9.73; b.) LHC/PCI 01/10/2015 --> overlapping 2.75 x 22 mm (pLAD), 2.5 x 22 mm (mLAD), and 2.75 x 12 mm (mLAD) Resolute Integrity DES   Obesity    OSA treated with BiPAP    Other hemochromatosis 12/05/2016   Past Surgical History:  Procedure Laterality Date   ANTERIOR FUSION LUMBAR SPINE     BACK SURGERY  2012   x2   CARDIAC CATHETERIZATION N/A 01/10/2015   Procedure: Left Heart Cath and Coronary Angiography;  Surgeon: Iran Ouch, MD;  Location: [facility] INVASIVE CV LAB;  Service: Cardiovascular;  Laterality: N/A;   CARDIAC CATHETERIZATION N/A 01/10/2015   Procedure: Coronary Stent Intervention;  Surgeon: Iran Ouch, MD;  Location: [facility] INVASIVE CV LAB;  Service: Cardiovascular;  Laterality: N/A;   COLONOSCOPY  2005   COLONOSCOPY WITH PROPOFOL N/A 02/14/2017   Procedure: COLONOSCOPY WITH PROPOFOL;  Surgeon: Wyline Mood, MD;  Location: [facility] ENDOSCOPY;   Service: Gastroenterology;  Laterality: N/A;   ESOPHAGOGASTRODUODENOSCOPY (EGD) WITH PROPOFOL N/A 02/14/2017   Procedure: ESOPHAGOGASTRODUODENOSCOPY (EGD) WITH PROPOFOL;  Surgeon: Wyline Mood, MD;  Location: [facility] ENDOSCOPY;  Service: Gastroenterology;  Laterality: N/A;   KNEE ARTHROPLASTY Right 05/09/2022   Procedure: COMPUTER ASSISTED TOTAL KNEE ARTHROPLASTY - RNFA;  Surgeon: Donato Heinz, MD;  Location: [facility] ORS;  Service: Orthopedics;  Laterality: Right;   KNEE SURGERY     bilateral   TONSILLECTOMY AND ADENOIDECTOMY     TOTAL SHOULDER REPLACEMENT Right    Family History  Problem Relation Age of Onset   Stroke Mother    Liver disease Father    Lung cancer Sister    Diabetes Sister  Liver disease Brother    Lung cancer Paternal Grandmother    Social History   Socioeconomic History   Marital status: Married    Spouse name: Alvino Chapel   Number of children: Not on file   Years of education: Not on file   Highest education level: Not on file  Occupational History   Not on file  Tobacco Use   Smoking status: Never   Smokeless tobacco: Current    Types: Snuff  Vaping Use   Vaping Use: Never used  Substance and Sexual Activity   Alcohol use: Yes    Alcohol/week: 2.0 - 3.0 standard drinks of alcohol    Types: 2 - 3 Cans of beer per week    Comment: ocassionally   Drug use: No   Sexual activity: Yes  Other Topics Concern   Not on file  Social History Narrative   Not on file   Social Determinants of Health   Financial Resource Strain: Low Risk  (06/15/2022)   Overall Financial Resource Strain (CARDIA)    Difficulty of Paying Living Expenses: Not hard at all  Food Insecurity: No Food Insecurity (06/15/2022)   Hunger Vital Sign    Worried About Running Out of Food in the Last Year: Never true    Ran Out of Food in the Last Year: Never true  Transportation Needs: No Transportation Needs (06/15/2022)   PRAPARE - Administrator, Civil Service (Medical): No    Lack of  Transportation (Non-Medical): No  Physical Activity: Insufficiently Active (06/15/2022)   Exercise Vital Sign    Days of Exercise per Week: 3 days    Minutes of Exercise per Session: 30 min  Stress: No Stress Concern Present (06/15/2022)   [employer] of Occupational Health - Occupational Stress Questionnaire    Feeling of Stress : Only a little  Social Connections: Moderately Integrated (06/15/2022)   Social Connection and Isolation Panel [NHANES]    Frequency of Communication with Friends and Family: More than three times a week    Frequency of Social Gatherings with Friends and Family: More than three times a week    Attends Religious Services: More than 4 times per year    Active Member of [employer] or Organizations: No    Attends Engineer, structural: Never    Marital Status: Married    Tobacco Counseling Ready to quit: Not Answered Counseling given: Not Answered   Clinical Intake:  Pre-visit preparation completed: Yes  Pain : No/denies pain     Nutritional Risks: None Diabetes: No  How often do you need to have someone help you when you read instructions, pamphlets, or other written materials from your doctor or pharmacy?: 1 - Never  Diabetic?no  Interpreter Needed?: No  Information entered by :: Kennedy Bucker, LPN   Activities of Daily Living    06/15/2022    3:42 PM 05/09/2022    5:00 PM  In your present state of health, do you have any difficulty performing the following activities:  Hearing? 0 0  Vision? 0 0  Difficulty concentrating or making decisions? 0 0  Walking or climbing stairs? 0 0  Dressing or bathing? 0 0  Doing errands, shopping? 0 0  Preparing Food and eating ? N   Using the Toilet? N   In the past six months, have you accidently leaked urine? N   Do you have problems with loss of bowel control? N   Managing your Medications? N   Managing your  Finances? N   Housekeeping or managing your Housekeeping? N     Patient Care  Team: Lorre Munroe, NP as PCP - General (Internal Medicine) Antonieta Iba, MD as PCP - Cardiology (Cardiology)  Indicate any recent Medical Services you may have received from other than [facility] providers in the past year (date may be approximate).     Assessment:   This is a routine wellness examination for Khalifah.  Hearing/Vision screen Hearing Screening - Comments:: No aids Vision Screening - Comments:: Readers-Dr.Woodard  Dietary issues and exercise activities discussed: Current Exercise Habits: Home exercise routine, Type of exercise: walking, Time (Minutes): 30, Frequency (Times/Week): 3, Weekly Exercise (Minutes/Week): 90, Intensity: Mild   Goals Addressed             This Visit's Progress    DIET - EAT MORE FRUITS AND VEGETABLES         Depression Screen    06/15/2022    3:38 PM 02/15/2022    9:35 AM 05/05/2021    2:55 PM 04/12/2020    1:34 PM 04/22/2019   11:19 AM 09/11/2018    3:52 PM 08/21/2018    4:04 PM  PHQ 2/9 Scores  PHQ - 2 Score 1 4 0 0 0 0 1  PHQ- 9 Score 2 11 1   0 0 2    Fall Risk    06/15/2022    3:41 PM 02/15/2022    9:36 AM 05/05/2021    2:55 PM 04/12/2020    1:34 PM 01/01/2020    3:33 PM  Fall Risk   Falls in the past year? 1 0 0 1 0  Comment    tripped   Number falls in past yr: 0  0 1 0  Injury with Fall? 1 0 0 0 0  Risk for fall due to : History of fall(s) No Fall Risks No Fall Risks Medication side effect No Fall Risks  Follow up Falls prevention discussed;Falls evaluation completed  Falls evaluation completed Falls evaluation completed;Education provided;Falls prevention discussed Falls evaluation completed    FALL RISK PREVENTION PERTAINING TO THE HOME:  Any stairs in or around the home? Yes  If so, are there any without handrails? No  Home free of loose throw rugs in walkways, pet beds, electrical cords, etc? Yes  Adequate lighting in your home to reduce risk of falls? Yes   ASSISTIVE DEVICES UTILIZED TO PREVENT FALLS:  Life  alert? No  Use of a cane, walker or w/c? No  Grab bars in the bathroom? No  Shower chair or bench in shower? No  Elevated toilet seat or a handicapped toilet? No   Cognitive Function:        06/15/2022    3:46 PM 04/12/2020    1:37 PM 08/21/2018    4:08 PM  6CIT Screen  What Year? 0 points 0 points 0 points  What month? 0 points 0 points 0 points  What time? 0 points 0 points 0 points  Count back from 20 0 points 0 points 0 points  Months in reverse 2 points 0 points 2 points  Repeat phrase 0 points 2 points 0 points  Total Score 2 points 2 points 2 points    Immunizations Immunization History  Administered Date(s) Administered   Fluad Quad(high Dose 65+) 01/01/2020, 10/07/2020, 10/26/2021   Influenza,inj,Quad PF,6+ Mos 01/01/2018   Influenza,inj,quad, With Preservative 01/03/2014   Influenza-Unspecified 12/17/2014, 11/03/2015, 01/01/2018   Moderna Sars-Covid-2 Vaccination 03/06/2019, 04/03/2019   Pneumococcal Conjugate-13 08/21/2018  Pneumococcal Polysaccharide-23 05/05/2021   Tdap 07/02/2017, 02/04/2018   Zoster, Live 05/02/2015    TDAP status: Up to date  Flu Vaccine status: Up to date  Pneumococcal vaccine status: Up to date  Covid-19 vaccine status: Completed vaccines  Qualifies for Shingles Vaccine? Yes   Zostavax completed Yes   Shingrix Completed?: No.    Education has been provided regarding the importance of this vaccine. Patient has been advised to call insurance company to determine out of pocket expense if they have not yet received this vaccine. Advised may also receive vaccine at local pharmacy or Health Dept. Verbalized acceptance and understanding.  Screening Tests Health Maintenance  Topic Date Due   Zoster Vaccines- Shingrix (1 of 2) Never done   COVID-19 Vaccine (3 - Moderna risk series) 05/01/2019   INFLUENZA VACCINE  08/23/2022   Medicare Annual Wellness (AWV)  06/15/2023   Colonoscopy  02/15/2027   DTaP/Tdap/Td (3 - Td or Tdap) 02/05/2028    Pneumonia Vaccine 13+ Years old  Completed   Hepatitis C Screening  Completed   HPV VACCINES  Aged Out    Health Maintenance  Health Maintenance Due  Topic Date Due   Zoster Vaccines- Shingrix (1 of 2) Never done   COVID-19 Vaccine (3 - Moderna risk series) 05/01/2019    Colorectal cancer screening: Type of screening: Colonoscopy. Completed 02/14/17. Repeat every 10 years  Lung Cancer Screening: (Low Dose CT Chest recommended if Age 66-80 years, 30 pack-year currently smoking OR have quit w/in 15years.) does not qualify.    Additional Screening:  Hepatitis C Screening: does qualify; Completed 09/16/17  Vision Screening: Recommended annual ophthalmology exams for early detection of glaucoma and other disorders of the eye. Is the patient up to date with their annual eye exam?  Yes  Who is the provider or what is the name of the office in which the patient attends annual eye exams? Dr.Woodard If pt is not established with a provider, would they like to be referred to a provider to establish care? No .   Dental Screening: Recommended annual dental exams for proper oral hygiene  Community Resource Referral / Chronic Care Management: CRR required this visit?  No   CCM required this visit?  No      Plan:     I have personally reviewed and noted the following in the patient's chart:   Medical and social history Use of alcohol, tobacco or illicit drugs  Current medications and supplements including opioid prescriptions. Patient is currently taking opioid prescriptions. Information provided to patient regarding non-opioid alternatives. Patient advised to discuss non-opioid treatment plan with their provider. Functional ability and status Nutritional status Physical activity Advanced directives List of other physicians Hospitalizations, surgeries, and ER visits in previous 12 months Vitals Screenings to include cognitive, depression, and falls Referrals and  appointments  In addition, I have reviewed and discussed with patient certain preventive protocols, quality metrics, and best practice recommendations. A written personalized care plan for preventive services as well as general preventive health recommendations were provided to patient.     Hal Hope, LPN   1/61/0960   Nurse Notes: none

## 2022-06-20 DIAGNOSIS — M25461 Effusion, right knee: Secondary | ICD-10-CM | POA: Diagnosis not present

## 2022-06-20 DIAGNOSIS — M25561 Pain in right knee: Secondary | ICD-10-CM | POA: Diagnosis not present

## 2022-06-21 DIAGNOSIS — Z96651 Presence of right artificial knee joint: Secondary | ICD-10-CM | POA: Diagnosis not present

## 2022-06-21 DIAGNOSIS — M1711 Unilateral primary osteoarthritis, right knee: Secondary | ICD-10-CM | POA: Diagnosis not present

## 2022-07-09 ENCOUNTER — Other Ambulatory Visit: Payer: Self-pay | Admitting: Internal Medicine

## 2022-07-10 NOTE — Telephone Encounter (Signed)
Requested Prescriptions  Pending Prescriptions Disp Refills   colchicine 0.6 MG tablet [Pharmacy Med Name: COLCHICINE 0.6 MG TABLET] 30 tablet 0    Sig: Take 2 tabs at the first sign of a gout attack followed by 1 tab 1 hour later.     Endocrinology:  Gout Agents - colchicine Failed - 07/09/2022 12:46 PM      Failed - CBC within normal limits and completed in the last 12 months    WBC  Date Value Ref Range Status  05/01/2022 8.2 4.0 - 10.5 K/uL Final   RBC  Date Value Ref Range Status  05/01/2022 4.76 4.22 - 5.81 MIL/uL Final   Hemoglobin  Date Value Ref Range Status  05/01/2022 15.0 13.0 - 17.0 g/dL Final   HCT  Date Value Ref Range Status  05/01/2022 45.5 39.0 - 52.0 % Final   MCHC  Date Value Ref Range Status  05/01/2022 33.0 30.0 - 36.0 g/dL Final   [facility]  Date Value Ref Range Status  05/01/2022 31.5 26.0 - 34.0 pg Final   MCV  Date Value Ref Range Status  05/01/2022 95.6 80.0 - 100.0 fL Final   No results found for: "PLTCOUNTKUC", "LABPLAT", "POCPLA" RDW  Date Value Ref Range Status  05/01/2022 12.6 11.5 - 15.5 % Final         Passed - Cr in normal range and within 360 days    Creat  Date Value Ref Range Status  02/15/2022 1.15 0.70 - 1.35 mg/dL Final   Creatinine, Ser  Date Value Ref Range Status  05/01/2022 1.04 0.61 - 1.24 mg/dL Final         Passed - ALT in normal range and within 360 days    ALT  Date Value Ref Range Status  05/01/2022 39 0 - 44 U/L Final         Passed - AST in normal range and within 360 days    AST  Date Value Ref Range Status  05/01/2022 32 15 - 41 U/L Final         Passed - Valid encounter within last 12 months    Recent Outpatient Visits           2 months ago Seasonal allergic rhinitis due to pollen   [facility] [place], Salvadore Oxford, NP   4 months ago Encounter for Department of Transportation (DOT) examination for driving license renewal   [place] [facility] [place],  Salvadore Oxford, NP   4 months ago Fever, unspecified fever cause   [place] [facility] [place], Salvadore Oxford, NP   5 months ago Influenza A    [facility] [place], Salvadore Oxford, NP   8 months ago Need for influenza vaccination   [facility] [place], Salvadore Oxford, NP

## 2022-08-27 DIAGNOSIS — M1812 Unilateral primary osteoarthritis of first carpometacarpal joint, left hand: Secondary | ICD-10-CM | POA: Diagnosis not present

## 2022-08-27 DIAGNOSIS — M180 Bilateral primary osteoarthritis of first carpometacarpal joints: Secondary | ICD-10-CM | POA: Diagnosis not present

## 2022-08-27 DIAGNOSIS — M19042 Primary osteoarthritis, left hand: Secondary | ICD-10-CM | POA: Diagnosis not present

## 2022-08-27 DIAGNOSIS — M19041 Primary osteoarthritis, right hand: Secondary | ICD-10-CM | POA: Diagnosis not present

## 2022-08-27 DIAGNOSIS — M19039 Primary osteoarthritis, unspecified wrist: Secondary | ICD-10-CM | POA: Diagnosis not present

## 2022-08-27 DIAGNOSIS — M19031 Primary osteoarthritis, right wrist: Secondary | ICD-10-CM | POA: Diagnosis not present

## 2022-08-27 DIAGNOSIS — M1811 Unilateral primary osteoarthritis of first carpometacarpal joint, right hand: Secondary | ICD-10-CM | POA: Diagnosis not present

## 2022-08-27 DIAGNOSIS — M19032 Primary osteoarthritis, left wrist: Secondary | ICD-10-CM | POA: Diagnosis not present

## 2022-08-29 ENCOUNTER — Other Ambulatory Visit: Payer: Self-pay | Admitting: Internal Medicine

## 2022-08-30 NOTE — Telephone Encounter (Signed)
Requested Prescriptions  Pending Prescriptions Disp Refills   buPROPion (WELLBUTRIN XL) 150 MG 24 hr tablet [Pharmacy Med Name: BUPROPION HCL XL 150 MG TABLET] 90 tablet 0    Sig: Take 1 tablet (150 mg total) by mouth daily.     Psychiatry: Antidepressants - bupropion Passed - 08/29/2022  1:56 PM      Passed - Cr in normal range and within 360 days    Creat  Date Value Ref Range Status  02/15/2022 1.15 0.70 - 1.35 mg/dL Final   Creatinine, Ser  Date Value Ref Range Status  05/01/2022 1.04 0.61 - 1.24 mg/dL Final         Passed - AST in normal range and within 360 days    AST  Date Value Ref Range Status  05/01/2022 32 15 - 41 U/L Final         Passed - ALT in normal range and within 360 days    ALT  Date Value Ref Range Status  05/01/2022 39 0 - 44 U/L Final         Passed - Completed PHQ-2 or PHQ-9 in the last 360 days      Passed - Last BP in normal range    BP Readings from Last 1 Encounters:  05/10/22 135/72         Passed - Valid encounter within last 6 months    Recent Outpatient Visits           3 months ago Seasonal allergic rhinitis due to pollen   [facility] [place], Salvadore Oxford, NP   5 months ago Encounter for Department of Transportation (DOT) examination for driving license renewal   [facility] [place], Salvadore Oxford, NP   6 months ago Fever, unspecified fever cause   [facility] [place], Salvadore Oxford, NP   6 months ago Influenza A   [facility] [place], Salvadore Oxford, NP   10 months ago Need for influenza vaccination   [facility] [place], Salvadore Oxford, NP

## 2022-09-05 DIAGNOSIS — M542 Cervicalgia: Secondary | ICD-10-CM | POA: Diagnosis not present

## 2022-09-05 DIAGNOSIS — G894 Chronic pain syndrome: Secondary | ICD-10-CM | POA: Diagnosis not present

## 2022-09-05 DIAGNOSIS — Z79891 Long term (current) use of opiate analgesic: Secondary | ICD-10-CM | POA: Diagnosis not present

## 2022-09-05 DIAGNOSIS — M5416 Radiculopathy, lumbar region: Secondary | ICD-10-CM | POA: Diagnosis not present

## 2022-09-05 DIAGNOSIS — M961 Postlaminectomy syndrome, not elsewhere classified: Secondary | ICD-10-CM | POA: Diagnosis not present

## 2022-09-05 DIAGNOSIS — Z5181 Encounter for therapeutic drug level monitoring: Secondary | ICD-10-CM | POA: Diagnosis not present

## 2022-10-05 DIAGNOSIS — D485 Neoplasm of uncertain behavior of skin: Secondary | ICD-10-CM | POA: Diagnosis not present

## 2022-10-05 DIAGNOSIS — C44519 Basal cell carcinoma of skin of other part of trunk: Secondary | ICD-10-CM | POA: Diagnosis not present

## 2022-10-25 DIAGNOSIS — C4491 Basal cell carcinoma of skin, unspecified: Secondary | ICD-10-CM | POA: Diagnosis not present

## 2022-10-25 DIAGNOSIS — C44519 Basal cell carcinoma of skin of other part of trunk: Secondary | ICD-10-CM | POA: Diagnosis not present

## 2022-12-06 DIAGNOSIS — M961 Postlaminectomy syndrome, not elsewhere classified: Secondary | ICD-10-CM | POA: Diagnosis not present

## 2022-12-06 DIAGNOSIS — M542 Cervicalgia: Secondary | ICD-10-CM | POA: Diagnosis not present

## 2022-12-06 DIAGNOSIS — Z5181 Encounter for therapeutic drug level monitoring: Secondary | ICD-10-CM | POA: Diagnosis not present

## 2022-12-06 DIAGNOSIS — G894 Chronic pain syndrome: Secondary | ICD-10-CM | POA: Diagnosis not present

## 2022-12-06 DIAGNOSIS — M5416 Radiculopathy, lumbar region: Secondary | ICD-10-CM | POA: Diagnosis not present

## 2022-12-06 DIAGNOSIS — Z79891 Long term (current) use of opiate analgesic: Secondary | ICD-10-CM | POA: Diagnosis not present

## 2022-12-06 DIAGNOSIS — Z23 Encounter for immunization: Secondary | ICD-10-CM | POA: Diagnosis not present

## 2022-12-10 ENCOUNTER — Other Ambulatory Visit: Payer: Self-pay | Admitting: Cardiovascular Disease

## 2022-12-10 ENCOUNTER — Telehealth: Payer: Self-pay | Admitting: Cardiovascular Disease

## 2022-12-10 ENCOUNTER — Other Ambulatory Visit: Payer: Self-pay | Admitting: Internal Medicine

## 2022-12-10 NOTE — Telephone Encounter (Signed)
 Left voice mail, pt needs appt scheduled from recall.

## 2022-12-10 NOTE — Telephone Encounter (Signed)
Please contact pt for future appointment. Pt due for 12 month f/u. 

## 2022-12-10 NOTE — Telephone Encounter (Signed)
Left voice mail

## 2022-12-11 NOTE — Telephone Encounter (Signed)
Requested Prescriptions  Pending Prescriptions Disp Refills   buPROPion (WELLBUTRIN XL) 150 MG 24 hr tablet [Pharmacy Med Name: BUPROPION HCL XL 150 MG TABLET] 90 tablet 0    Sig: Take 1 tablet (150 mg total) by mouth daily.     Psychiatry: Antidepressants - bupropion Passed - 12/10/2022  3:36 PM      Passed - Cr in normal range and within 360 days    Creat  Date Value Ref Range Status  02/15/2022 1.15 0.70 - 1.35 mg/dL Final   Creatinine, Ser  Date Value Ref Range Status  05/01/2022 1.04 0.61 - 1.24 mg/dL Final         Passed - AST in normal range and within 360 days    AST  Date Value Ref Range Status  05/01/2022 32 15 - 41 U/L Final         Passed - ALT in normal range and within 360 days    ALT  Date Value Ref Range Status  05/01/2022 39 0 - 44 U/L Final         Passed - Completed PHQ-2 or PHQ-9 in the last 360 days      Passed - Last BP in normal range    BP Readings from Last 1 Encounters:  05/10/22 135/72         Passed - Valid encounter within last 6 months    Recent Outpatient Visits           7 months ago Seasonal allergic rhinitis due to pollen   [facility] [place], Salvadore Oxford, NP   9 months ago Encounter for Department of Transportation (DOT) examination for driving license renewal   [facility] [place], Salvadore Oxford, NP   9 months ago Fever, unspecified fever cause   [facility] [place], Salvadore Oxford, NP   10 months ago Influenza A   [place] [place], Salvadore Oxford, NP   1 year ago Need for influenza vaccination   [facility] [place], Salvadore Oxford, NP

## 2022-12-13 NOTE — Telephone Encounter (Signed)
Left voice mail

## 2022-12-17 ENCOUNTER — Encounter: Payer: Self-pay | Admitting: Cardiovascular Disease

## 2022-12-17 ENCOUNTER — Telehealth: Payer: Self-pay | Admitting: Cardiovascular Disease

## 2022-12-17 NOTE — Telephone Encounter (Signed)
Called 3x, unable to reach letter sent  via mail

## 2022-12-17 NOTE — Telephone Encounter (Signed)
Called 3x and left voicemail. Unable to reach letter being sent via mail.

## 2023-01-11 DIAGNOSIS — H43811 Vitreous degeneration, right eye: Secondary | ICD-10-CM | POA: Diagnosis not present

## 2023-01-11 DIAGNOSIS — H2513 Age-related nuclear cataract, bilateral: Secondary | ICD-10-CM | POA: Diagnosis not present

## 2023-01-11 DIAGNOSIS — H35363 Drusen (degenerative) of macula, bilateral: Secondary | ICD-10-CM | POA: Diagnosis not present

## 2023-01-14 ENCOUNTER — Other Ambulatory Visit: Payer: Self-pay | Admitting: Cardiovascular Disease

## 2023-01-17 ENCOUNTER — Ambulatory Visit (INDEPENDENT_AMBULATORY_CARE_PROVIDER_SITE_OTHER): Payer: Medicare Other | Admitting: Internal Medicine

## 2023-01-17 ENCOUNTER — Encounter: Payer: Self-pay | Admitting: Internal Medicine

## 2023-01-17 VITALS — BP 122/74 | HR 90 | Ht 72.0 in | Wt 265.6 lb

## 2023-01-17 DIAGNOSIS — J0100 Acute maxillary sinusitis, unspecified: Secondary | ICD-10-CM

## 2023-01-17 MED ORDER — DOXYCYCLINE HYCLATE 100 MG PO TABS: 100.0000 mg | ORAL_TABLET | Freq: Two times a day (BID) | ORAL | 0 refills | Status: DC

## 2023-01-17 NOTE — Progress Notes (Signed)
Subjective:    Patient ID: Kenny Usman Deshazo, male    DOB: 03-13-1955, 67 y.o.   MRN: [id number]  HPI  Discussed the use of AI scribe software for clinical note transcription with the patient, who gave verbal consent to proceed.  The patient, with a history of prediabetes, has been experiencing symptoms consistent with a sinus infection for approximately one week. He reports a headache, sinus pressure, nasal congestion, ear pain, and a sore throat. The patient has been coughing up phlegm but denies any shortness of breath. He also experienced chills and body aches, particularly in the early morning hours. No gastrointestinal symptoms were reported.  The patient attempted to manage the symptoms with over-the-counter iron and decongestant medications, which initially seemed to help. However, upon discontinuing the medication, the symptoms returned. No recent exposure to sick individuals was reported.   Review of Systems   Past Medical History:  Diagnosis Date   Anxiety    Arthritis    Back pain    CAD (coronary artery disease)    a.) NSTEMI 01/07/2015; b.) LHC/PCI 01/10/2015: LM nl, LAD 85p/m (overlapping 2.75 x 22 mm (pLAD), 2.5 x 22 mm (mLAD), and 2.75 x 12 mm (mLAD) Resolute Integrity DES), D1 50ost, D2/3 small/nl, LCX 60d, OM1 nl, OM2 min irregs, OM3 nl, RCA 155m (likely culprit w/ L->R, R->R collats-->Med Rx). Nl EF.   Complication of anesthesia 2010   a.) cardiac arrest in OR during back surgery done in [place]; "never told me why"   Depressive disorder, not elsewhere classified    GERD (gastroesophageal reflux disease)    Gout    History of echocardiogram    a. 12/2014 Echo: EF 60-65%, no rwma, mild MR. Nl RV fxn.   Hyperlipidemia    Hypertension    Lipoma of back 02/09/2015   Long term current use of antithrombotics/antiplatelets    a.) on DAPT (ASA + clopidogrel)   Measles    Mobitz (type) II atrioventricular block    Mumps    NSTEMI (non-ST elevated myocardial infarction) (HCC)  01/07/2015   a.) troponins were trended: 0.63 --> 3.16 --> 10.08 --> 9.73; b.) LHC/PCI 01/10/2015 --> overlapping 2.75 x 22 mm (pLAD), 2.5 x 22 mm (mLAD), and 2.75 x 12 mm (mLAD) Resolute Integrity DES   Obesity    OSA treated with BiPAP    Other hemochromatosis 12/05/2016    Current Outpatient Medications  Medication Sig Dispense Refill   aspirin 81 MG chewable tablet Chew 1 tablet (81 mg total) by mouth in the morning and at bedtime. 60 tablet 0   atorvastatin (LIPITOR) 20 MG tablet Take 1 tablet (20 mg total) by mouth daily. 90 tablet 0   buPROPion (WELLBUTRIN XL) 150 MG 24 hr tablet Take 1 tablet (150 mg total) by mouth daily. 90 tablet 0   celecoxib (CELEBREX) 200 MG capsule Take 1 capsule (200 mg total) by mouth 2 (two) times daily. 60 capsule 1   cetirizine (ZYRTEC) 10 MG tablet Take 1 tablet (10 mg total) by mouth as needed for allergies. Reported on 02/09/2015 90 tablet 1   clopidogrel (PLAVIX) 75 MG tablet Take 1 tablet (75 mg total) by mouth daily. 90 tablet 3   colchicine 0.6 MG tablet Take 2 tabs at the first sign of a gout attack followed by 1 tab 1 hour later. 30 tablet 0   diclofenac Sodium (VOLTAREN) 1 % GEL Apply 2 g topically 4 (four) times daily. 100 g 1   DULoxetine (CYMBALTA) 60  MG capsule Take 1 capsule (60 mg total) by mouth 2 (two) times daily. (Patient taking differently: Take 60 mg by mouth every morning.) 180 capsule 0   ezetimibe (ZETIA) 10 MG tablet Take 1 tablet (10 mg total) by mouth daily. 90 tablet 3   Multiple Vitamin (MULTIVITAMIN) capsule Take 1 capsule by mouth daily.     oxyCODONE (OXY IR/ROXICODONE) 5 MG immediate release tablet Take 1 tablet (5 mg total) by mouth every 4 (four) hours as needed for moderate pain (pain score 4-6). 30 tablet 0   traMADol (ULTRAM) 50 MG tablet Take 1-2 tablets (50-100 mg total) by mouth every 4 (four) hours as needed for moderate pain. 30 tablet 0   valsartan-hydrochlorothiazide (DIOVAN-HCT) 160-12.5 MG tablet Take 1 tablet  by mouth daily. 90 tablet 0   oxyCODONE-acetaminophen (PERCOCET) 10-325 MG tablet Take 1 tablet by mouth 2 (two) times daily. (Patient not taking: Reported on 01/17/2023)     predniSONE (DELTASONE) 20 MG tablet Take 1 tablet (20 mg total) by mouth daily with breakfast. (Patient not taking: Reported on 01/17/2023) 4 tablet 0   triamcinolone (NASACORT) 55 MCG/ACT AERO nasal inhaler Place 2 sprays into the nose daily. (Patient not taking: Reported on 01/17/2023) 1 Inhaler 12   No current facility-administered medications for this visit.    Allergies  Allergen Reactions   Levaquin [Levofloxacin] Rash    Family History  Problem Relation Age of Onset   Stroke Mother    Liver disease Father    Lung cancer Sister    Diabetes Sister    Liver disease Brother    Lung cancer Paternal Grandmother     Social History   Socioeconomic History   Marital status: Married    Spouse name: Alvino Chapel   Number of children: Not on file   Years of education: Not on file   Highest education level: Not on file  Occupational History   Not on file  Tobacco Use   Smoking status: Never   Smokeless tobacco: Current    Types: Snuff  Vaping Use   Vaping status: Never Used  Substance and Sexual Activity   Alcohol use: Yes    Alcohol/week: 2.0 - 3.0 standard drinks of alcohol    Types: 2 - 3 Cans of beer per week    Comment: ocassionally   Drug use: No   Sexual activity: Yes  Other Topics Concern   Not on file  Social History Narrative   Not on file   Social Drivers of Health   Financial Resource Strain: Low Risk  (06/15/2022)   Overall Financial Resource Strain (CARDIA)    Difficulty of Paying Living Expenses: Not hard at all  Food Insecurity: No Food Insecurity (06/15/2022)   Hunger Vital Sign    Worried About Running Out of Food in the Last Year: Never true    Ran Out of Food in the Last Year: Never true  Transportation Needs: No Transportation Needs (06/15/2022)   PRAPARE - Doctor, general practice (Medical): No    Lack of Transportation (Non-Medical): No  Physical Activity: Insufficiently Active (06/15/2022)   Exercise Vital Sign    Days of Exercise per Week: 3 days    Minutes of Exercise per Session: 30 min  Stress: No Stress Concern Present (06/15/2022)   [employer] of Occupational Health - Occupational Stress Questionnaire    Feeling of Stress : Only a little  Social Connections: Moderately Integrated (06/15/2022)   Social Connection and Isolation Panel [  NHANES]    Frequency of Communication with Friends and Family: More than three times a week    Frequency of Social Gatherings with Friends and Family: More than three times a week    Attends Religious Services: More than 4 times per year    Active Member of [employer] or Organizations: No    Attends Banker Meetings: Never    Marital Status: Married  Catering manager Violence: Not At Risk (06/15/2022)   Humiliation, Afraid, Rape, and Kick questionnaire    Fear of Current or Ex-Partner: No    Emotionally Abused: No    Physically Abused: No    Sexually Abused: No     Constitutional: Pt reports headache, fever. Denies fever, or abrupt weight changes.  HEENT: Pt reports facial pressure, runny nose, nasal congestion, ear pain and sore throat. Denies eye pain, eye redness, ear pain, ringing in the ears, wax buildup, bloody nose. Respiratory: Pt reports cough. Denies difficulty breathing, shortness of breath.   Cardiovascular: Denies chest pain, chest tightness, palpitations or swelling in the hands or feet.  Gastrointestinal: Denies abdominal pain, bloating, constipation, diarrhea or blood in the stool.   No other specific complaints in a complete review of systems (except as listed in HPI above).      Objective:   Physical Exam  BP 122/74 (BP Location: Left Arm, Patient Position: Sitting, Cuff Size: Large)   Pulse 90   Ht 6' (1.829 m)   Wt 265 lb 9.6 oz (120.5 kg)   SpO2 99%   BMI 36.02  kg/m  Wt Readings from Last 3 Encounters:  01/17/23 265 lb 9.6 oz (120.5 kg)  06/15/22 270 lb (122.5 kg)  05/09/22 270 lb (122.5 kg)    General: Appears his stated age, obese in NAD. HEENT: Head: normal shape and size, Sinus pressure noted; Eyes: sclera white, no icterus, conjunctiva pink, PERRLA and EOMs intact;  Throat/Mouth: Teeth present, mucosa pink and moist, no exudate, lesions or ulcerations noted.  Neck: No adenopathy noted. Cardiovascular: Normal rate and rhythm. Pulmonary/Chest: Normal effort and positive vesicular breath sounds. No respiratory distress. No wheezes, rales or ronchi noted.  Neurological: Alert and oriented.   BMET    Component Value Date/Time   NA 140 05/01/2022 1517   NA 141 02/02/2019 0834   K 4.1 05/01/2022 1517   CL 104 05/01/2022 1517   CO2 28 05/01/2022 1517   GLUCOSE 107 (H) 05/01/2022 1517   BUN 23 05/01/2022 1517   BUN 18 02/02/2019 0834   CREATININE 1.04 05/01/2022 1517   CREATININE 1.15 02/15/2022 0915   CALCIUM 9.4 05/01/2022 1517   GFRNONAA >60 05/01/2022 1517   GFRNONAA 62 01/01/2020 1549   GFRAA 72 01/01/2020 1549    Lipid Panel     Component Value Date/Time   CHOL 139 10/26/2021 1427   CHOL 149 02/02/2019 0834   TRIG 170 (H) 10/26/2021 1427   HDL 58 10/26/2021 1427   HDL 67 02/02/2019 0834   CHOLHDL 2.4 10/26/2021 1427   VLDL 23 01/08/2015 0225   LDLCALC 56 10/26/2021 1427    CBC    Component Value Date/Time   WBC 8.2 05/01/2022 1517   RBC 4.76 05/01/2022 1517   HGB 15.0 05/01/2022 1517   HCT 45.5 05/01/2022 1517   PLT 288 05/01/2022 1517   MCV 95.6 05/01/2022 1517   MCH 31.5 05/01/2022 1517   MCHC 33.0 05/01/2022 1517   RDW 12.6 05/01/2022 1517   LYMPHSABS 1,554 01/01/2020 1549   MONOABS  0.6 08/27/2018 1501   EOSABS 333 01/01/2020 1549   BASOSABS 37 01/01/2020 1549    Hgb A1C Lab Results  Component Value Date   HGBA1C 5.9 (H) 10/26/2021            Assessment & Plan:   Assessment and Plan     Acute maxillary sinusitis Symptoms of headache, sinus pressure, nasal congestion, ear pain, sore throat, and productive cough for about a week. Over-the-counter decongestants provided some initial relief but symptoms persisted. -Prescribe Doxycycline 100mg  BID for 10 days. -Advise to use over-the-counter Flonase.  RTC in 1 week for follow-up of chronic conditions Nicki Reaper, NP

## 2023-01-17 NOTE — Patient Instructions (Signed)

## 2023-01-22 ENCOUNTER — Ambulatory Visit (INDEPENDENT_AMBULATORY_CARE_PROVIDER_SITE_OTHER): Payer: Medicare Other | Admitting: Internal Medicine

## 2023-01-22 ENCOUNTER — Encounter: Payer: Self-pay | Admitting: Internal Medicine

## 2023-01-22 VITALS — BP 122/72 | Ht 72.0 in | Wt 263.8 lb

## 2023-01-22 DIAGNOSIS — G4733 Obstructive sleep apnea (adult) (pediatric): Secondary | ICD-10-CM | POA: Diagnosis not present

## 2023-01-22 DIAGNOSIS — E66812 Obesity, class 2: Secondary | ICD-10-CM

## 2023-01-22 DIAGNOSIS — I252 Old myocardial infarction: Secondary | ICD-10-CM | POA: Diagnosis not present

## 2023-01-22 DIAGNOSIS — M1A0710 Idiopathic chronic gout, right ankle and foot, without tophus (tophi): Secondary | ICD-10-CM | POA: Diagnosis not present

## 2023-01-22 DIAGNOSIS — F32A Depression, unspecified: Secondary | ICD-10-CM

## 2023-01-22 DIAGNOSIS — I10 Essential (primary) hypertension: Secondary | ICD-10-CM

## 2023-01-22 DIAGNOSIS — F419 Anxiety disorder, unspecified: Secondary | ICD-10-CM

## 2023-01-22 DIAGNOSIS — M150 Primary generalized (osteo)arthritis: Secondary | ICD-10-CM | POA: Diagnosis not present

## 2023-01-22 DIAGNOSIS — K219 Gastro-esophageal reflux disease without esophagitis: Secondary | ICD-10-CM

## 2023-01-22 DIAGNOSIS — Z6835 Body mass index (BMI) 35.0-35.9, adult: Secondary | ICD-10-CM

## 2023-01-22 DIAGNOSIS — E782 Mixed hyperlipidemia: Secondary | ICD-10-CM | POA: Diagnosis not present

## 2023-01-22 DIAGNOSIS — I25118 Atherosclerotic heart disease of native coronary artery with other forms of angina pectoris: Secondary | ICD-10-CM | POA: Diagnosis not present

## 2023-01-22 DIAGNOSIS — M961 Postlaminectomy syndrome, not elsewhere classified: Secondary | ICD-10-CM

## 2023-01-22 DIAGNOSIS — R7303 Prediabetes: Secondary | ICD-10-CM | POA: Diagnosis not present

## 2023-01-22 MED ORDER — CLINDAMYCIN HCL 300 MG PO CAPS: 300.0000 mg | ORAL_CAPSULE | Freq: Three times a day (TID) | ORAL | 0 refills | Status: DC

## 2023-01-22 NOTE — Assessment & Plan Note (Signed)
Uric acid level today Continue colchicine as needed

## 2023-01-22 NOTE — Progress Notes (Signed)
 Subjective:    Patient ID: Robert Greer, male    DOB: 29-Jun-1955, 67 y.o.   MRN: [id number]  HPI  Patient presents to clinic today for follow-up of chronic conditions.  HTN: His BP today is 122/72.  He is taking valsartan  hct as prescribed.  ECG from 12/2021 reviewed.  OSA: He averages 7 hours of sleep per night with the use of his CPAP.  Sleep study from 01/2021 reviewed.  GERD: He is not sure what triggers this. He takes rolaids as needed with good relief of symptoms.  Upper GI from 01/2017 reviewed.  OA/chronic low back pain: Generalized.  He takes celebrex , duloxetine , uses voltaren  gel,  oxycodone  as needed with good relief of symptoms.  He does not follow with orthopedics but he follows with pain clinic.  HLD with CAD status post MI: His last LDL was 56, triglycerides 829, 12/2021.  He denies myalgias on atorvastatin , ezetimibe .  He is taking aspirin  and plavix  as well.  Prediabetes: His last A1c was 5.9%, 12/2021.  He is not taking any oral diabetic medication this time.  He does not check his sugars.  Anxiety and depression: Chronic, managed on duloxetine  and bupropion . He is not currently seeing a therapist. He denies SI/HI.  Gout: He denies recent flare. He has colchicine  to take if needed. He does not follow with rheumatology.  Review of Systems   Past Medical History:  Diagnosis Date   Anxiety    Arthritis    Back pain    CAD (coronary artery disease)    a.) NSTEMI 01/07/2015; b.) LHC/PCI 01/10/2015: LM nl, LAD 85p/m (overlapping 2.75 x 22 mm (pLAD), 2.5 x 22 mm (mLAD), and 2.75 x 12 mm (mLAD) Resolute Integrity DES), D1 50ost, D2/3 small/nl, LCX 60d, OM1 nl, OM2 min irregs, OM3 nl, RCA 165m (likely culprit w/ L->R, R->R collats-->Med Rx). Nl EF.   Complication of anesthesia 2010   a.) cardiac arrest in OR during back surgery done in [place]; never told me why   Depressive disorder, not elsewhere classified    GERD (gastroesophageal reflux disease)    Gout    History  of echocardiogram    a. 12/2014 Echo: EF 60-65%, no rwma, mild MR. Nl RV fxn.   Hyperlipidemia    Hypertension    Lipoma of back 02/09/2015   Long term current use of antithrombotics/antiplatelets    a.) on DAPT (ASA + clopidogrel )   Measles    Mobitz (type) II atrioventricular block    Mumps    NSTEMI (non-ST elevated myocardial infarction) (HCC) 01/07/2015   a.) troponins were trended: 0.63 --> 3.16 --> 10.08 --> 9.73; b.) LHC/PCI 01/10/2015 --> overlapping 2.75 x 22 mm (pLAD), 2.5 x 22 mm (mLAD), and 2.75 x 12 mm (mLAD) Resolute Integrity DES   Obesity    OSA treated with BiPAP    Other hemochromatosis 12/05/2016    Current Outpatient Medications  Medication Sig Dispense Refill   aspirin  81 MG chewable tablet Chew 1 tablet (81 mg total) by mouth in the morning and at bedtime. 60 tablet 0   atorvastatin  (LIPITOR) 20 MG tablet Take 1 tablet (20 mg total) by mouth daily. 90 tablet 0   buPROPion  (WELLBUTRIN  XL) 150 MG 24 hr tablet Take 1 tablet (150 mg total) by mouth daily. 90 tablet 0   celecoxib  (CELEBREX ) 200 MG capsule Take 1 capsule (200 mg total) by mouth 2 (two) times daily. 60 capsule 1   cetirizine  (ZYRTEC ) 10 MG tablet Take  1 tablet (10 mg total) by mouth as needed for allergies. Reported on 02/09/2015 90 tablet 1   clopidogrel  (PLAVIX ) 75 MG tablet Take 1 tablet (75 mg total) by mouth daily. 90 tablet 3   colchicine  0.6 MG tablet Take 2 tabs at the first sign of a gout attack followed by 1 tab 1 hour later. 30 tablet 0   diclofenac  Sodium (VOLTAREN ) 1 % GEL Apply 2 g topically 4 (four) times daily. 100 g 1   doxycycline  (VIBRA -TABS) 100 MG tablet Take 1 tablet (100 mg total) by mouth 2 (two) times daily. 20 tablet 0   DULoxetine  (CYMBALTA ) 60 MG capsule Take 1 capsule (60 mg total) by mouth 2 (two) times daily. (Patient taking differently: Take 60 mg by mouth every morning.) 180 capsule 0   ezetimibe  (ZETIA ) 10 MG tablet Take 1 tablet (10 mg total) by mouth daily. 90 tablet 3    Multiple Vitamin (MULTIVITAMIN) capsule Take 1 capsule by mouth daily.     oxyCODONE  (OXY IR/ROXICODONE ) 5 MG immediate release tablet Take 1 tablet (5 mg total) by mouth every 4 (four) hours as needed for moderate pain (pain score 4-6). 30 tablet 0   oxyCODONE -acetaminophen  (PERCOCET) 10-325 MG tablet Take 1 tablet by mouth 2 (two) times daily. (Patient not taking: Reported on 01/17/2023)     predniSONE  (DELTASONE ) 20 MG tablet Take 1 tablet (20 mg total) by mouth daily with breakfast. (Patient not taking: Reported on 01/17/2023) 4 tablet 0   traMADol  (ULTRAM ) 50 MG tablet Take 1-2 tablets (50-100 mg total) by mouth every 4 (four) hours as needed for moderate pain. 30 tablet 0   triamcinolone  (NASACORT ) 55 MCG/ACT AERO nasal inhaler Place 2 sprays into the nose daily. (Patient not taking: Reported on 01/17/2023) 1 Inhaler 12   valsartan -hydrochlorothiazide  (DIOVAN -HCT) 160-12.5 MG tablet Take 1 tablet by mouth daily. 90 tablet 0   No current facility-administered medications for this visit.    Allergies  Allergen Reactions   Levaquin  [Levofloxacin ] Rash    Family History  Problem Relation Age of Onset   Stroke Mother    Liver disease Father    Lung cancer Sister    Diabetes Sister    Liver disease Brother    Lung cancer Paternal Grandmother     Social History   Socioeconomic History   Marital status: Married    Spouse name: Idell   Number of children: Not on file   Years of education: Not on file   Highest education level: Not on file  Occupational History   Not on file  Tobacco Use   Smoking status: Never   Smokeless tobacco: Current    Types: Snuff  Vaping Use   Vaping status: Never Used  Substance and Sexual Activity   Alcohol use: Yes    Alcohol/week: 2.0 - 3.0 standard drinks of alcohol    Types: 2 - 3 Cans of beer per week    Comment: ocassionally   Drug use: No   Sexual activity: Yes  Other Topics Concern   Not on file  Social History Narrative   Not on  file   Social Drivers of Health   Financial Resource Strain: Low Risk  (06/15/2022)   Overall Financial Resource Strain (CARDIA)    Difficulty of Paying Living Expenses: Not hard at all  Food Insecurity: No Food Insecurity (06/15/2022)   Hunger Vital Sign    Worried About Running Out of Food in the Last Year: Never true    Ran  Out of Food in the Last Year: Never true  Transportation Needs: No Transportation Needs (06/15/2022)   PRAPARE - Administrator, Civil Service (Medical): No    Lack of Transportation (Non-Medical): No  Physical Activity: Insufficiently Active (06/15/2022)   Exercise Vital Sign    Days of Exercise per Week: 3 days    Minutes of Exercise per Session: 30 min  Stress: No Stress Concern Present (06/15/2022)   [employer] of Occupational Health - Occupational Stress Questionnaire    Feeling of Stress : Only a little  Social Connections: Moderately Integrated (06/15/2022)   Social Connection and Isolation Panel [NHANES]    Frequency of Communication with Friends and Family: More than three times a week    Frequency of Social Gatherings with Friends and Family: More than three times a week    Attends Religious Services: More than 4 times per year    Active Member of [employer] or Organizations: No    Attends Banker Meetings: Never    Marital Status: Married  Catering Manager Violence: Not At Risk (06/15/2022)   Humiliation, Afraid, Rape, and Kick questionnaire    Fear of Current or Ex-Partner: No    Emotionally Abused: No    Physically Abused: No    Sexually Abused: No     Constitutional: Denies fever, malaise, fatigue, headache or abrupt weight changes.  HEENT: Denies eye pain, eye redness, ear pain, ringing in the ears, wax buildup, runny nose, nasal congestion, bloody nose, or sore throat. Respiratory: Denies difficulty breathing, shortness of breath, cough or sputum production.   Cardiovascular: Denies chest pain, chest tightness,  palpitations or swelling in the hands or feet.  Gastrointestinal: Pt reports intermittent reflux. Denies abdominal pain, bloating, constipation, diarrhea or blood in the stool.  GU: Denies urgency, frequency, pain with urination, burning sensation, blood in urine, odor or discharge. Musculoskeletal: Pt reports chronic low back pain, joint pain. Denies decrease in range of motion, difficulty with gait, muscle pain or joint swelling.  Skin: Denies redness, rashes, lesions or ulcercations.  Neurological: Denies dizziness, difficulty with memory, difficulty with speech or problems with balance and coordination.  Psych: Pt has a history of anxiety and depression. Denies SI/HI.  No other specific complaints in a complete review of systems (except as listed in HPI above).     Objective:   Physical Exam  There were no vitals taken for this visit.  Wt Readings from Last 3 Encounters:  01/17/23 265 lb 9.6 oz (120.5 kg)  06/15/22 270 lb (122.5 kg)  05/09/22 270 lb (122.5 kg)    General: Appears his stated age, obese, in NAD. Skin: Warm, dry and intact.  HEENT: Head: normal shape and size; Eyes: sclera white, no icterus, conjunctiva pink, PERRLA and EOMs intact;  Cardiovascular: Normal rate and rhythm. S1,S2 noted.  No murmur, rubs or gallops noted. No JVD or BLE edema. No carotid bruits noted. Pulmonary/Chest: Normal effort and positive vesicular breath sounds. No respiratory distress. No wheezes, rales or ronchi noted.  Abdomen: Normal bowel sounds.  Musculoskeletal: Joint enlargement noted of bilateral hands.  No difficulty with gait.  Neurological: Alert and oriented.  Psych: Mood and affect normal.  Behavior is normal.  Judgment and thought content normal.  BMET    Component Value Date/Time   NA 140 05/01/2022 1517   NA 141 02/02/2019 0834   K 4.1 05/01/2022 1517   CL 104 05/01/2022 1517   CO2 28 05/01/2022 1517   GLUCOSE 107 (H)  05/01/2022 1517   BUN 23 05/01/2022 1517   BUN 18  02/02/2019 0834   CREATININE 1.04 05/01/2022 1517   CREATININE 1.15 02/15/2022 0915   CALCIUM  9.4 05/01/2022 1517   GFRNONAA >60 05/01/2022 1517   GFRNONAA 62 01/01/2020 1549   GFRAA 72 01/01/2020 1549    Lipid Panel     Component Value Date/Time   CHOL 139 10/26/2021 1427   CHOL 149 02/02/2019 0834   TRIG 170 (H) 10/26/2021 1427   HDL 58 10/26/2021 1427   HDL 67 02/02/2019 0834   CHOLHDL 2.4 10/26/2021 1427   VLDL 23 01/08/2015 0225   LDLCALC 56 10/26/2021 1427    CBC    Component Value Date/Time   WBC 8.2 05/01/2022 1517   RBC 4.76 05/01/2022 1517   HGB 15.0 05/01/2022 1517   HCT 45.5 05/01/2022 1517   PLT 288 05/01/2022 1517   MCV 95.6 05/01/2022 1517   MCH 31.5 05/01/2022 1517   MCHC 33.0 05/01/2022 1517   RDW 12.6 05/01/2022 1517   LYMPHSABS 1,554 01/01/2020 1549   MONOABS 0.6 08/27/2018 1501   EOSABS 333 01/01/2020 1549   BASOSABS 37 01/01/2020 1549    Hgb A1C Lab Results  Component Value Date   HGBA1C 5.9 (H) 10/26/2021           Assessment & Plan:     RTC in 6 months, follow up chronic conditions Angeline Laura, NP

## 2023-01-22 NOTE — Assessment & Plan Note (Signed)
CBC and iron panel today 

## 2023-01-22 NOTE — Assessment & Plan Note (Signed)
A1c today Encourage low-carb diet and exercise for weight loss

## 2023-01-22 NOTE — Assessment & Plan Note (Signed)
Continue medication for pain management

## 2023-01-22 NOTE — Assessment & Plan Note (Signed)
 Continue Celebrex, duloxetine, Voltaren gel and oxycodone Encourage weight loss as this can help reduce joint pain

## 2023-01-22 NOTE — Assessment & Plan Note (Signed)
C-Met and lipid profile today Continue atorvastatin, ezetimibe, aspirin and Plavix Encouraged low fat diet and exercise for weight loss 

## 2023-01-22 NOTE — Assessment & Plan Note (Signed)
 C-Met and lipid profile today Continue atorvastatin, ezetimibe, aspirin and plavix Encouraged low fat diet and exercise for weight loss

## 2023-01-22 NOTE — Patient Instructions (Signed)
Prediabetes Eating Plan Prediabetes is a condition that causes blood sugar (glucose) levels to be higher than normal. This increases the risk for developing type 2 diabetes (type 2 diabetes mellitus). Working with a health care provider or nutrition specialist (dietitian) to make diet and lifestyle changes can help prevent the onset of diabetes. These changes may help you: Control your blood glucose levels. Improve your cholesterol levels. Manage your blood pressure. What are tips for following this plan? Reading food labels Read food labels to check the amount of fat, salt (sodium), and sugar in prepackaged foods. Avoid foods that have: Saturated fats. Trans fats. Added sugars. Avoid foods that have more than 300 milligrams (mg) of sodium per serving. Limit your sodium intake to less than 2,300 mg each day. Shopping Avoid buying pre-made and processed foods. Avoid buying drinks with added sugar. Cooking Cook with olive oil. Do not use butter, lard, or ghee. Bake, broil, grill, steam, or boil foods. Avoid frying. Meal planning  Work with your dietitian to create an eating plan that is right for you. This may include tracking how many calories you take in each day. Use a food diary, notebook, or mobile application to track what you eat at each meal. Consider following a Mediterranean diet. This includes: Eating several servings of fresh fruits and vegetables each day. Eating fish at least twice a week. Eating one serving each day of whole grains, beans, nuts, and seeds. Using olive oil instead of other fats. Limiting alcohol. Limiting red meat. Using nonfat or low-fat dairy products. Consider following a plant-based diet. This includes dietary choices that focus on eating mostly vegetables and fruit, grains, beans, nuts, and seeds. If you have high blood pressure, you may need to limit your sodium intake or follow a diet such as the DASH (Dietary Approaches to Stop Hypertension) eating  plan. The DASH diet aims to lower high blood pressure. Lifestyle Set weight loss goals with help from your health care team. It is recommended that most people with prediabetes lose 7% of their body weight. Exercise for at least 30 minutes 5 or more days a week. Attend a support group or seek support from a mental health counselor. Take over-the-counter and prescription medicines only as told by your health care provider. What foods are recommended? Fruits Berries. Bananas. Apples. Oranges. Grapes. Papaya. Mango. Pomegranate. Kiwi. Grapefruit. Cherries. Vegetables Lettuce. Spinach. Peas. Beets. Cauliflower. Cabbage. Broccoli. Carrots. Tomatoes. Squash. Eggplant. Herbs. Peppers. Onions. Cucumbers. Brussels sprouts. Grains Whole grains, such as whole-wheat or whole-grain breads, crackers, cereals, and pasta. Unsweetened oatmeal. Bulgur. Barley. Quinoa. Brown rice. Corn or whole-wheat flour tortillas or taco shells. Meats and other proteins Seafood. Poultry without skin. Lean cuts of pork and beef. Tofu. Eggs. Nuts. Beans. Dairy Low-fat or fat-free dairy products, such as yogurt, cottage cheese, and cheese. Beverages Water. Tea. Coffee. Sugar-free or diet soda. Seltzer water. Low-fat or nonfat milk. Milk alternatives, such as soy or almond milk. Fats and oils Olive oil. Canola oil. Sunflower oil. Grapeseed oil. Avocado. Walnuts. Sweets and desserts Sugar-free or low-fat pudding. Sugar-free or low-fat ice cream and other frozen treats. Seasonings and condiments Herbs. Sodium-free spices. Mustard. Relish. Low-salt, low-sugar ketchup. Low-salt, low-sugar barbecue sauce. Low-fat or fat-free mayonnaise. The items listed above may not be a complete list of recommended foods and beverages. Contact a dietitian for more information. What foods are not recommended? Fruits Fruits canned with syrup. Vegetables Canned vegetables. Frozen vegetables with butter or cream sauce. Grains Refined white  flour and flour   products, such as bread, pasta, snack foods, and cereals. Meats and other proteins Fatty cuts of meat. Poultry with skin. Breaded or fried meat. Processed meats. Dairy Full-fat yogurt, cheese, or milk. Beverages Sweetened drinks, such as iced tea and soda. Fats and oils Butter. Lard. Ghee. Sweets and desserts Baked goods, such as cake, cupcakes, pastries, cookies, and cheesecake. Seasonings and condiments Spice mixes with added salt. Ketchup. Barbecue sauce. Mayonnaise. The items listed above may not be a complete list of foods and beverages that are not recommended. Contact a dietitian for more information. Where to find more information American Diabetes Association: www.diabetes.org Summary You may need to make diet and lifestyle changes to help prevent the onset of diabetes. These changes can help you control blood sugar, improve cholesterol levels, and manage blood pressure. Set weight loss goals with help from your health care team. It is recommended that most people with prediabetes lose 7% of their body weight. Consider following a Mediterranean diet. This includes eating plenty of fresh fruits and vegetables, whole grains, beans, nuts, seeds, fish, and low-fat dairy, and using olive oil instead of other fats. This information is not intended to replace advice given to you by your health care provider. Make sure you discuss any questions you have with your health care provider. Document Revised: 04/09/2019 Document Reviewed: 04/09/2019 Elsevier Patient Education  2024 Elsevier Inc.  

## 2023-01-22 NOTE — Assessment & Plan Note (Signed)
Try to identify and avoid foods that trigger reflux Encourage weight loss as this can help reduce reflux symptoms Okay to continue OTC treatment as needed if symptoms recurring >3 days weekly

## 2023-01-22 NOTE — Assessment & Plan Note (Signed)
Encouraged diet and exercise for weight loss ?

## 2023-01-22 NOTE — Assessment & Plan Note (Signed)
 Continue valsartan HCT Reinforce DASH diet and exercise for weight loss C-Met today

## 2023-01-22 NOTE — Assessment & Plan Note (Signed)
Continue duloxetine and bupropion Support offered

## 2023-01-22 NOTE — Assessment & Plan Note (Signed)
Encourage weight loss as this can produce sleep apnea symptoms Continue CPAP 

## 2023-01-23 LAB — COMPLETE METABOLIC PANEL WITH GFR
AG Ratio: 1.6 (calc) (ref 1.0–2.5)
ALT: 32 U/L (ref 9–46)
AST: 21 U/L (ref 10–35)
Albumin: 4.5 g/dL (ref 3.6–5.1)
Alkaline phosphatase (APISO): 83 U/L (ref 35–144)
BUN: 19 mg/dL (ref 7–25)
CO2: 25 mmol/L (ref 20–32)
Calcium: 9.2 mg/dL (ref 8.6–10.3)
Chloride: 103 mmol/L (ref 98–110)
Creat: 1.09 mg/dL (ref 0.70–1.35)
Globulin: 2.8 g/dL (ref 1.9–3.7)
Glucose, Bld: 95 mg/dL (ref 65–139)
Potassium: 4.2 mmol/L (ref 3.5–5.3)
Sodium: 140 mmol/L (ref 135–146)
Total Bilirubin: 0.4 mg/dL (ref 0.2–1.2)
Total Protein: 7.3 g/dL (ref 6.1–8.1)
eGFR: 73 mL/min/{1.73_m2} (ref >=60)

## 2023-01-23 LAB — LIPID PANEL
Cholesterol: 135 mg/dL (ref <200)
HDL: 57 mg/dL (ref >=40)
LDL Cholesterol (Calc): 53 mg/dL
Non-HDL Cholesterol (Calc): 78 mg/dL (ref <130)
Total CHOL/HDL Ratio: 2.4 (calc) (ref <5.0)
Triglycerides: 170 mg/dL — ABNORMAL HIGH (ref <150)

## 2023-01-23 LAB — CBC
HCT: 45.1 % (ref 38.5–50.0)
Hemoglobin: 15.4 g/dL (ref 13.2–17.1)
MCH: 32.5 pg (ref 27.0–33.0)
MCHC: 34.1 g/dL (ref 32.0–36.0)
MCV: 95.1 fL (ref 80.0–100.0)
MPV: 9.5 fL (ref 7.5–12.5)
Platelets: 328 10*3/uL (ref 140–400)
RBC: 4.74 10*6/uL (ref 4.20–5.80)
RDW: 11.8 % (ref 11.0–15.0)
WBC: 9.7 10*3/uL (ref 3.8–10.8)

## 2023-01-23 LAB — HEMOGLOBIN A1C
Hgb A1c MFr Bld: 6.1 %{Hb} — ABNORMAL HIGH (ref <5.7)
Mean Plasma Glucose: 128 mg/dL
eAG (mmol/L): 7.1 mmol/L

## 2023-01-23 LAB — IRON,TIBC AND FERRITIN PANEL
%SAT: 58 % — ABNORMAL HIGH (ref 20–48)
Ferritin: 342 ng/mL (ref 24–380)
Iron: 175 ug/dL (ref 50–180)
TIBC: 303 ug/dL (ref 250–425)

## 2023-01-23 LAB — URIC ACID: Uric Acid, Serum: 6.7 mg/dL (ref 4.0–8.0)

## 2023-02-28 ENCOUNTER — Encounter: Payer: Self-pay | Admitting: Oncology

## 2023-03-01 NOTE — Progress Notes (Signed)
 Date:  03/04/2023   ID:  Robert Greer, DOB [birth year], MRN [id number]  Patient Location:  [address] [place] [facility] [id number]   Provider location:   [facility], [place] office  PCP:  Carollynn Cirri, NP  Cardiologist:  Cheryll Corti [facility]  Chief Complaint  Patient presents with   12 month follow up     "Doing well."     History of Present Illness:    Robert Greer is a 68 y.o. 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 Aubry Blase Completed PT, doing well  Blood pressure well-controlled  No chest pain or shortness of breath on exertion Given overlapping stents, prefers to stay on aspirin  Plavix , previously discussed non-smoker A1C 6.1 Total chol 135, LDL 53  EKG personally reviewed by myself on todays visit EKG Interpretation Date/Time:  Monday March 04 2023 08:33:27 EST Ventricular Rate:  75 PR Interval:  162 QRS Duration:  100 QT Interval:  390 QTC Calculation: 435 R Axis:   39  Text Interpretation: Normal sinus rhythm Normal ECG When compared with ECG of 11-Jan-2015 04:56, No significant change was found Confirmed by Belva Boyden ([id number]) on 03/04/2023 8:48:46 AM     Lab Results  Component Value Date   CHOL 135 01/22/2023   HDL 57 01/22/2023   LDLCALC 53 01/22/2023   TRIG 170 (H) 01/22/2023    Prior CV studies:   The following studies were reviewed today:  Echocardiogram report was not completed,  ejection fraction unavailable but estimated at normal, 60% with no wall motion abnormality on stress test  Cath 2016 Mid RCA lesion, 100% stenosed. Dist Cx lesion, 60% stenosed. Ost 1st Diag to 1st Diag lesion, 50% stenosed. Prox LAD to Mid LAD lesion, 85% stenosed. Post intervention, there is a 5% residual stenosis. The left ventricular systolic function is normal.   1. Significant three-vessel coronary artery disease.  Occluded mid right coronary artery is likely the culprit for myocardial infarction.  However, there are readily right to right collaterals and left-to-right collaterals. There is a 60% distal left circumflex stenosis and an 85% proximal to mid heavily calcified LAD disease.   2. Low normal LV systolic function with mildly elevated left ventricular end-diastolic pressure.   3. Successful angioplasty and 3 overlapped drug-eluting stent placement to the proximal to mid LAD. Sluggish flow in the jailed first diagonal which improved with nitroglycerin .   It appears that the RCA occlusion is more than 53 days old and already has some faint collaterals. Thus, no PCI was performed. The distal vessel also appears to be diffusely diseased and does not seem to be a good target for intervention. I decided to intervene in the proximal to mid LAD which was heavily calcified. This was a difficult procedure. The distal left circumflex disease can be left to be treated medically. Continue indefinite dual antiplatelet therapy if possible. Aggressive treatment of risk factors.   Past Medical History:  Diagnosis Date  Anxiety    Arthritis    Back pain    CAD (coronary artery disease)    a.) NSTEMI 01/07/2015; b.) LHC/PCI 01/10/2015: LM nl, LAD 85p/m (overlapping 2.75 x 22 mm (pLAD), 2.5 x 22 mm (mLAD), and 2.75 x 12 mm (mLAD) Resolute Integrity DES), D1 50ost, D2/3 small/nl, LCX 60d, OM1 nl, OM2 min irregs, OM3 nl, RCA 186m (likely culprit w/ L->R, R->R collats-->Med Rx). Nl EF.    Complication of anesthesia 2010   a.) cardiac arrest in OR during back surgery done in [place]; "never told me why"   Depressive disorder, not elsewhere classified    GERD (gastroesophageal reflux disease)    Gout    History of echocardiogram    a. 12/2014 Echo: EF 60-65%, no rwma, mild MR. Nl RV fxn.   Hyperlipidemia    Hypertension    Lipoma of back 02/09/2015   Long term current use of antithrombotics/antiplatelets    a.) on DAPT (ASA + clopidogrel )   Measles    Mobitz (type) II atrioventricular block    Mumps    NSTEMI (non-ST elevated myocardial infarction) (HCC) 01/07/2015   a.) troponins were trended: 0.63 --> 3.16 --> 10.08 --> 9.73; b.) LHC/PCI 01/10/2015 --> overlapping 2.75 x 22 mm (pLAD), 2.5 x 22 mm (mLAD), and 2.75 x 12 mm (mLAD) Resolute Integrity DES   Obesity    OSA treated with BiPAP    Other hemochromatosis 12/05/2016   Past Surgical History:  Procedure Laterality Date   ANTERIOR FUSION LUMBAR SPINE     BACK SURGERY  2012   x2   CARDIAC CATHETERIZATION N/A 01/10/2015   Procedure: Left Heart Cath and Coronary Angiography;  Surgeon: Wenona Hamilton, MD;  Location: [facility] INVASIVE CV LAB;  Service: Cardiovascular;  Laterality: N/A;   CARDIAC CATHETERIZATION N/A 01/10/2015   Procedure: Coronary Stent Intervention;  Surgeon: Wenona Hamilton, MD;  Location: [facility] INVASIVE CV LAB;  Service: Cardiovascular;  Laterality: N/A;   COLONOSCOPY  2005   COLONOSCOPY WITH PROPOFOL  N/A 02/14/2017   Procedure: COLONOSCOPY WITH PROPOFOL ;  Surgeon: Luke Salaam, MD;  Location: [facility] ENDOSCOPY;  Service: Gastroenterology;  Laterality: N/A;   ESOPHAGOGASTRODUODENOSCOPY (EGD) WITH PROPOFOL  N/A 02/14/2017   Procedure: ESOPHAGOGASTRODUODENOSCOPY (EGD) WITH PROPOFOL ;  Surgeon: Luke Salaam, MD;  Location: [facility] ENDOSCOPY;  Service: Gastroenterology;  Laterality: N/A;   KNEE ARTHROPLASTY Right 05/09/2022   Procedure: COMPUTER ASSISTED TOTAL KNEE ARTHROPLASTY - RNFA;  Surgeon: Arlyne Lame, MD;   Location: [facility] ORS;  Service: Orthopedics;  Laterality: Right;   KNEE SURGERY     bilateral   TONSILLECTOMY AND ADENOIDECTOMY     TOTAL SHOULDER REPLACEMENT Right      Current Meds  Medication Sig   aspirin  81 MG chewable tablet Chew 1 tablet (81 mg total) by mouth in the morning and at bedtime.   atorvastatin  (LIPITOR) 20 MG tablet Take 1 tablet (20 mg total) by mouth daily.   buPROPion  (WELLBUTRIN  XL) 150 MG 24 hr tablet Take 1 tablet (150 mg total) by mouth daily.   celecoxib  (CELEBREX ) 200 MG capsule Take 1 capsule (200 mg total) by mouth 2 (two) times daily.   cetirizine  (ZYRTEC ) 10 MG tablet Take 1 tablet (10 mg total) by mouth as needed for allergies. Reported on 02/09/2015   clopidogrel  (PLAVIX ) 75 MG tablet Take 1 tablet (75 mg total) by mouth daily.   colchicine  0.6 MG tablet Take 2 tabs at the first sign of a gout attack followed by 1 tab 1 hour later.  diclofenac  Sodium (VOLTAREN ) 1 % GEL Apply 2 g topically 4 (four) times daily.   DULoxetine  (CYMBALTA ) 60 MG capsule Take 1 capsule (60 mg total) by mouth 2 (two) times daily. (Patient taking differently: Take 60 mg by mouth every morning.)   ezetimibe  (ZETIA ) 10 MG tablet Take 1 tablet (10 mg total) by mouth daily.   oxyCODONE  (OXY IR/ROXICODONE ) 5 MG immediate release tablet Take 1 tablet (5 mg total) by mouth every 4 (four) hours as needed for moderate pain (pain score 4-6).   pregabalin  (LYRICA ) 75 MG capsule Take 75 mg by mouth 3 (three) times daily.   valsartan -hydrochlorothiazide  (DIOVAN -HCT) 160-12.5 MG tablet Take 1 tablet by mouth daily.     Allergies:   Levaquin  [levofloxacin ]   Social History   Tobacco Use   Smoking status: Never   Smokeless tobacco: Current    Types: Snuff  Vaping Use   Vaping status: Never Used  Substance Use Topics   Alcohol use: Yes    Alcohol/week: 2.0 - 3.0 standard drinks of alcohol    Types: 2 - 3 Cans of beer per week    Comment: ocassionally   Drug use: No     Family Hx: The  patient's family history includes Diabetes in his sister; Liver disease in his brother and father; Lung cancer in his paternal grandmother and sister; Stroke in his mother.  ROS:   Please see the history of present illness.    Review of Systems  Constitutional: Negative.   Respiratory: Negative.    Cardiovascular: Negative.   Gastrointestinal: Negative.   Musculoskeletal:  Positive for back pain.  Neurological: Negative.   Psychiatric/Behavioral: Negative.    All other systems reviewed and are negative.  Labs/Other Tests and Data Reviewed:    Recent Labs: 01/22/2023: ALT 32; BUN 19; Creat 1.09; Hemoglobin 15.4; Platelets 328; Potassium 4.2; Sodium 140   Recent Lipid Panel Lab Results  Component Value Date/Time   CHOL 135 01/22/2023 11:47 AM   CHOL 149 02/02/2019 08:34 AM   TRIG 170 (H) 01/22/2023 11:47 AM   HDL 57 01/22/2023 11:47 AM   HDL 67 02/02/2019 08:34 AM   CHOLHDL 2.4 01/22/2023 11:47 AM   LDLCALC 53 01/22/2023 11:47 AM    Wt Readings from Last 3 Encounters:  03/04/23 265 lb 2 oz (120.3 kg)  01/22/23 263 lb 12.8 oz (119.7 kg)  01/17/23 265 lb 9.6 oz (120.5 kg)     Exam:   BP 120/80 (BP Location: Left Arm, Patient Position: Sitting, Cuff Size: Normal)   Pulse 75   Ht 5\' 11"  (1.803 m)   Wt 265 lb 2 oz (120.3 kg)   SpO2 96%   BMI 36.98 kg/m  Constitutional:  oriented to person, place, and time. No distress.  HENT:  Head: Grossly normal Eyes:  no discharge. No scleral icterus.  Neck: No JVD, no carotid bruits  Cardiovascular: Regular rate and rhythm, no murmurs appreciated Pulmonary/Chest: Clear to auscultation bilaterally, no wheezes or rails Abdominal: Soft.  no distension.  no tenderness.  Musculoskeletal: Normal range of motion Neurological:  normal muscle tone. Coordination normal. No atrophy Skin: Skin warm and dry Psychiatric: normal affect, pleasant  ASSESSMENT & PLAN:    Coronary artery disease of native artery of native heart with stable  angina pectoris (HCC) Currently with no symptoms of angina. No further workup at this time. Continue current medication regimen.  Was told by Dr.  Alvenia Aus that prior catheterization/intervention was difficult with overlapping stents and he should stay  on aspirin  and Plavix  .  He prefers to stay on both aspirin  Plavix  for now  Hypertension, unspecified type Blood pressure is well controlled on today's visit. No changes made to the medications.  Mixed hyperlipidemia Cholesterol is at goal on the current lipid regimen. No changes to the medications were made.  Chronic low back pain, unspecified back pain laterality, unspecified whether sciatica present Followed by neurosurgery  back surgeries, still with chronic pain Completed left triceps surgery, knee surgery    Signed, Belva Boyden, MD  03/04/2023 8:48 AM    [facility] [address], [place], [place] [address]

## 2023-03-04 ENCOUNTER — Ambulatory Visit: Payer: Medicare Other | Attending: Cardiovascular Disease | Admitting: Cardiovascular Disease

## 2023-03-04 ENCOUNTER — Encounter: Payer: Self-pay | Admitting: Cardiovascular Disease

## 2023-03-04 VITALS — BP 120/80 | HR 75 | Ht 71.0 in | Wt 265.1 lb

## 2023-03-04 DIAGNOSIS — E785 Hyperlipidemia, unspecified: Secondary | ICD-10-CM | POA: Insufficient documentation

## 2023-03-04 DIAGNOSIS — K219 Gastro-esophageal reflux disease without esophagitis: Secondary | ICD-10-CM | POA: Diagnosis not present

## 2023-03-04 DIAGNOSIS — G4733 Obstructive sleep apnea (adult) (pediatric): Secondary | ICD-10-CM | POA: Insufficient documentation

## 2023-03-04 DIAGNOSIS — I25118 Atherosclerotic heart disease of native coronary artery with other forms of angina pectoris: Secondary | ICD-10-CM | POA: Diagnosis not present

## 2023-03-04 DIAGNOSIS — I10 Essential (primary) hypertension: Secondary | ICD-10-CM | POA: Diagnosis not present

## 2023-03-04 MED ORDER — VALSARTAN-HYDROCHLOROTHIAZIDE 160-12.5 MG PO TABS: 1.0000 | ORAL_TABLET | Freq: Every day | ORAL | 3 refills | Status: AC

## 2023-03-04 MED ORDER — ATORVASTATIN CALCIUM 20 MG PO TABS: 20.0000 mg | ORAL_TABLET | Freq: Every day | ORAL | 3 refills | Status: AC

## 2023-03-04 MED ORDER — CLOPIDOGREL BISULFATE 75 MG PO TABS: 75.0000 mg | ORAL_TABLET | Freq: Every day | ORAL | 3 refills | Status: AC

## 2023-03-04 MED ORDER — EZETIMIBE 10 MG PO TABS: 10.0000 mg | ORAL_TABLET | Freq: Every day | ORAL | 3 refills | Status: AC

## 2023-03-04 NOTE — Patient Instructions (Signed)

## 2023-03-08 ENCOUNTER — Ambulatory Visit: Payer: Medicare Other | Admitting: Sleep Medicine

## 2023-03-08 ENCOUNTER — Encounter: Payer: Self-pay | Admitting: Sleep Medicine

## 2023-03-08 VITALS — BP 124/84 | HR 87 | Temp 97.8°F | Ht 71.0 in | Wt 264.8 lb

## 2023-03-08 DIAGNOSIS — G4733 Obstructive sleep apnea (adult) (pediatric): Secondary | ICD-10-CM | POA: Diagnosis not present

## 2023-03-08 DIAGNOSIS — M1711 Unilateral primary osteoarthritis, right knee: Secondary | ICD-10-CM | POA: Diagnosis not present

## 2023-03-08 DIAGNOSIS — I10 Essential (primary) hypertension: Secondary | ICD-10-CM | POA: Diagnosis not present

## 2023-03-08 DIAGNOSIS — M542 Cervicalgia: Secondary | ICD-10-CM | POA: Diagnosis not present

## 2023-03-08 DIAGNOSIS — Z79891 Long term (current) use of opiate analgesic: Secondary | ICD-10-CM | POA: Diagnosis not present

## 2023-03-08 DIAGNOSIS — M5416 Radiculopathy, lumbar region: Secondary | ICD-10-CM | POA: Diagnosis not present

## 2023-03-08 DIAGNOSIS — G894 Chronic pain syndrome: Secondary | ICD-10-CM | POA: Diagnosis not present

## 2023-03-08 DIAGNOSIS — M961 Postlaminectomy syndrome, not elsewhere classified: Secondary | ICD-10-CM | POA: Diagnosis not present

## 2023-03-08 DIAGNOSIS — Z5181 Encounter for therapeutic drug level monitoring: Secondary | ICD-10-CM | POA: Diagnosis not present

## 2023-03-08 NOTE — Progress Notes (Signed)
Name:Zeshan R Cisar MRN: [id number] DOB: 1955/11/26   CHIEF COMPLAINT:  PAP F/U   HISTORY OF PRESENT ILLNESS:  Mr. Nastasi is a 68 y.o. w/ a h/o OSA, CAD, HTN and obesity who presents for PAP follow up visit. 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 Reports using PAP device every night. Reports feeling more refreshed upon awakening with PAP therapy. He is currently using the Mirage FX nasal mask. Reports occasional leaks and admits to dry mouth. Reports nocturnal awakenings to urinate, however does not have difficulty falling back to sleep. Admits to 15-20 lb weight gain over the last few years. Denies morning headaches or night sweats. Denies drowsy driving.   Bedtime 10 pm Sleep onset 10 mins Rise time 6 am   EPWORTH SLEEP SCORE 1     No data to display          PAST MEDICAL HISTORY :   has a past medical history of Anxiety, Arthritis, Back pain, CAD (coronary artery disease), Complication of anesthesia (2010), Depressive disorder, not elsewhere classified, GERD (gastroesophageal reflux disease), Gout, History of echocardiogram, Hyperlipidemia, Hypertension, Lipoma of back (02/09/2015), Long term current use of antithrombotics/antiplatelets, Measles, Mobitz (type) II atrioventricular block, Mumps, NSTEMI (non-ST elevated myocardial infarction) (HCC) (01/07/2015), Obesity, OSA treated with BiPAP, and Other hemochromatosis (12/05/2016).  has a past surgical history that includes Back surgery (2012); Tonsillectomy and adenoidectomy; Knee surgery; Anterior fusion lumbar spine; Total shoulder replacement (Right); Cardiac catheterization (N/A, 01/10/2015); Cardiac catheterization (N/A, 01/10/2015); Colonoscopy (2005); Esophagogastroduodenoscopy (egd) with propofol (N/A, 02/14/2017); Colonoscopy with propofol (N/A, 02/14/2017); and Knee Arthroplasty (Right, 05/09/2022). Prior to Admission medications   Medication Sig Start Date End Date Taking? Authorizing Provider   aspirin 81 MG chewable tablet Chew 1 tablet (81 mg total) by mouth in the morning and at bedtime. 05/10/22  Yes Dedra Skeens, PA-C  atorvastatin (LIPITOR) 20 MG tablet Take 1 tablet (20 mg total) by mouth daily. 03/04/23  Yes Antonieta Iba, MD  buPROPion (WELLBUTRIN XL) 150 MG 24 hr tablet Take 1 tablet (150 mg total) by mouth daily. 12/11/22  Yes Lorre Munroe, NP  celecoxib (CELEBREX) 200 MG capsule Take 1 capsule (200 mg total) by mouth 2 (two) times daily. 05/10/22  Yes Dedra Skeens, PA-C  cetirizine (ZYRTEC) 10 MG tablet Take 1 tablet (10 mg total) by mouth as needed for allergies. Reported on 02/09/2015 02/09/20  Yes Karamalegos, Netta Neat, DO  clopidogrel (PLAVIX) 75 MG tablet Take 1 tablet (75 mg total) by mouth daily. 03/04/23  Yes Antonieta Iba, MD  colchicine 0.6 MG tablet Take 2 tabs at the first sign of a gout attack followed by 1 tab 1 hour later. 07/10/22  Yes Lorre Munroe, NP  diclofenac Sodium (VOLTAREN) 1 % GEL Apply 2 g topically 4 (four) times daily. 05/05/21  Yes Lorre Munroe, NP  DULoxetine (CYMBALTA) 60 MG capsule Take 1 capsule (60 mg total) by mouth 2 (two) times daily. Patient taking differently: Take 60 mg by mouth every morning. 08/30/20  Yes Karamalegos, Netta Neat, DO  ezetimibe (ZETIA) 10 MG tablet Take 1 tablet (10 mg total) by mouth daily. 03/04/23  Yes Gollan, Tollie Pizza, MD  oxyCODONE (OXY IR/ROXICODONE) 5 MG immediate release tablet Take 1 tablet (5 mg total) by mouth every 4 (four) hours as needed for moderate pain (pain score 4-6). 05/10/22  Yes Dedra Skeens, PA-C  pregabalin (LYRICA) 75 MG capsule Take 75 mg by  mouth 3 (three) times daily. 01/29/23  Yes [provider]  traMADol (ULTRAM) 50 MG tablet Take 1-2 tablets (50-100 mg total) by mouth every 4 (four) hours as needed for moderate pain. 05/10/22  Yes Dedra Skeens, PA-C  valsartan-hydrochlorothiazide (DIOVAN-HCT) 160-12.5 MG tablet Take 1 tablet by mouth daily. 03/04/23  Yes Antonieta Iba, MD    Allergies  Allergen Reactions   Levaquin [Levofloxacin] Rash    FAMILY HISTORY:  family history includes Diabetes in his sister; Liver disease in his brother and father; Lung cancer in his paternal grandmother and sister; Stroke in his mother. SOCIAL HISTORY:  reports that he has never smoked. His smokeless tobacco use includes snuff. He reports current alcohol use of about 2.0 - 3.0 standard drinks of alcohol per week. He reports that he does not use drugs.   Review of Systems:  Gen:  Denies  fever, sweats, chills weight loss  HEENT: Denies blurred vision, double vision, ear pain, eye pain, hearing loss, nose bleeds, sore throat Cardiac:  No dizziness, chest pain or heaviness, chest tightness,edema, No JVD Resp:   No cough, -sputum production, -shortness of breath,-wheezing, -hemoptysis,  Gi: Denies swallowing difficulty, stomach pain, nausea or vomiting, diarrhea, constipation, bowel incontinence Gu:  Denies bladder incontinence, burning urine Ext:   Denies Joint pain, stiffness or swelling Skin: Denies  skin rash, easy bruising or bleeding or hives Endoc:  Denies polyuria, polydipsia , polyphagia or weight change Psych:   Denies depression, insomnia or hallucinations  Other:  All other systems negative  VITAL SIGNS: BP 124/84 (BP Location: Left Arm, Patient Position: Sitting, Cuff Size: Normal)   Pulse 87   Temp 97.8 F (36.6 C) (Temporal)   Ht 5\' 11"  (1.803 m)   Wt 264 lb 12.8 oz (120.1 kg)   SpO2 97%   BMI 36.93 kg/m    Physical Examination:   General Appearance: No distress  EYES PERRLA, EOM intact.   NECK Supple, No JVD Pulmonary: normal breath sounds, No wheezing.  CardiovascularNormal S1,S2.  No m/r/g.   Abdomen: Benign, Soft, non-tender. Skin:   warm, no rashes, no ecchymosis  Extremities: normal, no cyanosis, clubbing. Neuro:without focal findings,  speech normal  PSYCHIATRIC: Mood, affect within normal limits.   ASSESSMENT AND PLAN  OSA PAP  compliance data reviewed. Patient demonstrates excellent compliance. He is using and benefiting from PAP therapy. For mask leak, will try patient on the Airfit F30i FFM. Discussed the consequences of untreated sleep apnea. Advised not to drive drowsy for safety of patient and others. Will follow up in 1 year to review CPAP efficacy and compliance data.    HTN Stable, on current management. Following with PCP.   Obesity Counseled patient on diet and lifestyle modification.    Patient  satisfied with Plan of action and management. All questions answered   I spent a total of 30 minutes reviewing chart data, face-to-face evaluation with the patient, counseling and coordination of care as detailed above.    Tempie Hoist, M.D.  Sleep Medicine [facility]

## 2023-03-08 NOTE — Patient Instructions (Addendum)

## 2023-03-16 ENCOUNTER — Other Ambulatory Visit: Payer: Self-pay | Admitting: Internal Medicine

## 2023-03-19 NOTE — Telephone Encounter (Signed)
 Requested Prescriptions  Pending Prescriptions Disp Refills   buPROPion (WELLBUTRIN XL) 150 MG 24 hr tablet [Pharmacy Med Name: BUPROPION HCL XL 150 MG TABLET] 90 tablet 1    Sig: Take 1 tablet (150 mg total) by mouth daily.     Psychiatry: Antidepressants - bupropion Passed - 03/19/2023  8:13 AM      Passed - Cr in normal range and within 360 days    Creat  Date Value Ref Range Status  01/22/2023 1.09 0.70 - 1.35 mg/dL Final         Passed - AST in normal range and within 360 days    AST  Date Value Ref Range Status  01/22/2023 21 10 - 35 U/L Final         Passed - ALT in normal range and within 360 days    ALT  Date Value Ref Range Status  01/22/2023 32 9 - 46 U/L Final         Passed - Completed PHQ-2 or PHQ-9 in the last 360 days      Passed - Last BP in normal range    BP Readings from Last 1 Encounters:  03/08/23 124/84         Passed - Valid encounter within last 6 months    Recent Outpatient Visits           1 month ago Chronic idiopathic gout involving toe of right foot without tophus   [facility] [place], Salvadore Oxford, NP   2 months ago Acute non-recurrent maxillary sinusitis   [place] [facility] [place], [place] W, NP   10 months ago Seasonal allergic rhinitis due to pollen   [facility] [place], Salvadore Oxford, NP   1 year ago Encounter for Department of Transportation (DOT) examination for driving license renewal   [place] [facility], Salvadore Oxford, NP   1 year ago Fever, unspecified fever cause   [place] [facility] [place], Salvadore Oxford, NP       Future Appointments             In 4 months [place], Salvadore Oxford, NP [place] [facility], [facility]

## 2023-05-03 DIAGNOSIS — H269 Unspecified cataract: Secondary | ICD-10-CM | POA: Diagnosis not present

## 2023-05-09 DIAGNOSIS — E66812 Obesity, class 2: Secondary | ICD-10-CM | POA: Diagnosis not present

## 2023-05-09 DIAGNOSIS — Z6835 Body mass index (BMI) 35.0-35.9, adult: Secondary | ICD-10-CM | POA: Diagnosis not present

## 2023-05-09 DIAGNOSIS — Z96651 Presence of right artificial knee joint: Secondary | ICD-10-CM | POA: Diagnosis not present

## 2023-06-05 DIAGNOSIS — M961 Postlaminectomy syndrome, not elsewhere classified: Secondary | ICD-10-CM | POA: Diagnosis not present

## 2023-06-05 DIAGNOSIS — M542 Cervicalgia: Secondary | ICD-10-CM | POA: Diagnosis not present

## 2023-06-05 DIAGNOSIS — M1711 Unilateral primary osteoarthritis, right knee: Secondary | ICD-10-CM | POA: Diagnosis not present

## 2023-06-05 DIAGNOSIS — Z5181 Encounter for therapeutic drug level monitoring: Secondary | ICD-10-CM | POA: Diagnosis not present

## 2023-06-05 DIAGNOSIS — G894 Chronic pain syndrome: Secondary | ICD-10-CM | POA: Diagnosis not present

## 2023-06-05 DIAGNOSIS — Z79891 Long term (current) use of opiate analgesic: Secondary | ICD-10-CM | POA: Diagnosis not present

## 2023-06-05 DIAGNOSIS — M5416 Radiculopathy, lumbar region: Secondary | ICD-10-CM | POA: Diagnosis not present

## 2023-06-21 ENCOUNTER — Ambulatory Visit: Payer: Medicare Other

## 2023-06-21 DIAGNOSIS — Z0000 Encounter for general adult medical examination without abnormal findings: Secondary | ICD-10-CM

## 2023-06-21 NOTE — Patient Instructions (Signed)
 Mr. Robert Greer , Thank you for taking time out of your busy schedule to complete your Annual Wellness Visit with me. I enjoyed our conversation and look forward to speaking with you again next year. I, as well as your care team,  appreciate your ongoing commitment to your health goals. Please review the following plan we discussed and let me know if I can assist you in the future.   Follow up Visits: Next Medicare AWV with our clinical staff:   06/26/24 @ 1:20 PM BY PHONE Have you seen your provider in the last 6 months (3 months if uncontrolled diabetes)? Yes   Clinician Recommendations:  Aim for 30 minutes of exercise or brisk walking, 6-8 glasses of water, and 5 servings of fruits and vegetables each Feng. TAKE CARE!      This is a list of the screening recommended for you and due dates:  Health Maintenance  Topic Date Due   Zoster (Shingles) Vaccine (1 of 2) 02/24/1972   COVID-19 Vaccine (3 - Moderna risk series) 05/01/2019   Flu Shot  08/23/2023   Medicare Annual Wellness Visit  06/20/2024   Colon Cancer Screening  02/15/2027   DTaP/Tdap/Td vaccine (3 - Td or Tdap) 02/05/2028   Pneumonia Vaccine  Completed   Hepatitis C Screening  Completed   HPV Vaccine  Aged Out   Meningitis B Vaccine  Aged Out    Advanced directives: (ACP Link)Information on Advanced Care Planning can be found at [place]  Best boy Advance Health Care Directives Advance Health Care Directives. [email or web address]  Advance Care Planning is important because it:  [x]  Makes sure you receive the medical care that is consistent with your values, goals, and preferences  [x]  It provides guidance to your family and loved ones and reduces their decisional burden about whether or not they are making the right decisions based on your wishes.  Follow the link provided in your after visit summary or read over the paperwork we have mailed to you to help you started getting your Advance Directives in place. If you need  assistance in completing these, please reach out to us  so that we can help you!

## 2023-06-21 NOTE — Progress Notes (Signed)
 Subjective:   Robert Greer is a 68 y.o. who presents for a Medicare Wellness preventive visit.  As a reminder, Annual Wellness Visits don't include a physical exam, and some assessments may be limited, especially if this visit is performed virtually. We may recommend an in-person follow-up visit with your provider if needed.  Visit Complete: Virtual I connected with  Robert Greer on 06/21/23 by a audio enabled telemedicine application and verified that I am speaking with the correct person using two identifiers.  Patient Location: Home  Provider Location: Home Office  I discussed the limitations of evaluation and management by telemedicine. The patient expressed understanding and agreed to proceed.  Vital Signs: Because this visit was a virtual/telehealth visit, some criteria may be missing or patient reported. Any vitals not documented were not able to be obtained and vitals that have been documented are patient reported.  VideoDeclined- This patient declined Librarian, academic. Therefore the visit was completed with audio only.  Persons Participating in Visit: Patient.  AWV Questionnaire: No: Patient Medicare AWV questionnaire was not completed prior to this visit.  Cardiac Risk Factors include: advanced age (>33men, >19 women);hypertension;dyslipidemia;male gender;obesity (BMI >30kg/m2)     Objective:     Today's Vitals   06/21/23 1401  PainSc: 3    There is no height or weight on file to calculate BMI.     06/21/2023    2:06 PM 06/15/2022    3:41 PM 05/09/2022    5:00 PM 05/09/2022    6:25 AM 05/01/2022    2:40 PM 12/29/2021    1:52 PM 04/12/2020    1:33 PM  Advanced Directives  Does Patient Have a Medical Advance Directive? No No Yes Yes Yes No Yes  Type of Surveyor, minerals;Living will    Healthcare Power of [place];Living will  Does patient want to make changes to medical advance directive?   No - Patient  declined No - Patient declined     Copy of Healthcare Power of Attorney in Chart?   No - copy requested    No - copy requested  Would patient like information on creating a medical advance directive? No - Patient declined No - Patient declined         Current Medications (verified) Outpatient Encounter Medications as of 06/21/2023  Medication Sig   aspirin  81 MG chewable tablet Chew 1 tablet (81 mg total) by mouth in the morning and at bedtime.   atorvastatin  (LIPITOR) 20 MG tablet Take 1 tablet (20 mg total) by mouth daily.   buPROPion  (WELLBUTRIN  XL) 150 MG 24 hr tablet Take 1 tablet (150 mg total) by mouth daily.   celecoxib  (CELEBREX ) 200 MG capsule Take 1 capsule (200 mg total) by mouth 2 (two) times daily.   cetirizine  (ZYRTEC ) 10 MG tablet Take 1 tablet (10 mg total) by mouth as needed for allergies. Reported on 02/09/2015   clopidogrel  (PLAVIX ) 75 MG tablet Take 1 tablet (75 mg total) by mouth daily.   colchicine  0.6 MG tablet Take 2 tabs at the first sign of a gout attack followed by 1 tab 1 hour later.   diclofenac  Sodium (VOLTAREN ) 1 % GEL Apply 2 g topically 4 (four) times daily.   DULoxetine  (CYMBALTA ) 60 MG capsule Take 1 capsule (60 mg total) by mouth 2 (two) times daily. (Patient taking differently: Take 60 mg by mouth every morning.)   ezetimibe  (ZETIA ) 10 MG tablet Take 1 tablet (10  mg total) by mouth daily.   oxyCODONE  (OXY IR/ROXICODONE ) 5 MG immediate release tablet Take 1 tablet (5 mg total) by mouth every 4 (four) hours as needed for moderate pain (pain score 4-6).   pregabalin  (LYRICA ) 75 MG capsule Take 75 mg by mouth 3 (three) times daily.   valsartan -hydrochlorothiazide  (DIOVAN -HCT) 160-12.5 MG tablet Take 1 tablet by mouth daily.   traMADol  (ULTRAM ) 50 MG tablet Take 1-2 tablets (50-100 mg total) by mouth every 4 (four) hours as needed for moderate pain. (Patient not taking: Reported on 06/21/2023)   No facility-administered encounter medications on file as of  06/21/2023.    Allergies (verified) Levaquin  [levofloxacin ]   History: Past Medical History:  Diagnosis Date   Anxiety    Arthritis    Back pain    CAD (coronary artery disease)    a.) NSTEMI 01/07/2015; b.) LHC/PCI 01/10/2015: LM nl, LAD 85p/m (overlapping 2.75 x 22 mm (pLAD), 2.5 x 22 mm (mLAD), and 2.75 x 12 mm (mLAD) Resolute Integrity DES), D1 50ost, D2/3 small/nl, LCX 60d, OM1 nl, OM2 min irregs, OM3 nl, RCA 154m (likely culprit w/ L->R, R->R collats-->Med Rx). Nl EF.   Complication of anesthesia 2010   a.) cardiac arrest in OR during back surgery done in [place]; "never told me why"   Depressive disorder, not elsewhere classified    GERD (gastroesophageal reflux disease)    Gout    History of echocardiogram    a. 12/2014 Echo: EF 60-65%, no rwma, mild MR. Nl RV fxn.   Hyperlipidemia    Hypertension    Lipoma of back 02/09/2015   Long term current use of antithrombotics/antiplatelets    a.) on DAPT (ASA + clopidogrel )   Measles    Mobitz (type) II atrioventricular block    Mumps    NSTEMI (non-ST elevated myocardial infarction) (HCC) 01/07/2015   a.) troponins were trended: 0.63 --> 3.16 --> 10.08 --> 9.73; b.) LHC/PCI 01/10/2015 --> overlapping 2.75 x 22 mm (pLAD), 2.5 x 22 mm (mLAD), and 2.75 x 12 mm (mLAD) Resolute Integrity DES   Obesity    OSA treated with BiPAP    Other hemochromatosis 12/05/2016   Past Surgical History:  Procedure Laterality Date   ANTERIOR FUSION LUMBAR SPINE     BACK SURGERY  2012   x2   CARDIAC CATHETERIZATION N/A 01/10/2015   Procedure: Left Heart Cath and Coronary Angiography;  Surgeon: Wenona Hamilton, MD;  Location: [facility] INVASIVE CV LAB;  Service: Cardiovascular;  Laterality: N/A;   CARDIAC CATHETERIZATION N/A 01/10/2015   Procedure: Coronary Stent Intervention;  Surgeon: Wenona Hamilton, MD;  Location: [facility] INVASIVE CV LAB;  Service: Cardiovascular;  Laterality: N/A;   COLONOSCOPY  2005   COLONOSCOPY WITH PROPOFOL  N/A 02/14/2017    Procedure: COLONOSCOPY WITH PROPOFOL ;  Surgeon: Luke Salaam, MD;  Location: [facility] ENDOSCOPY;  Service: Gastroenterology;  Laterality: N/A;   ESOPHAGOGASTRODUODENOSCOPY (EGD) WITH PROPOFOL  N/A 02/14/2017   Procedure: ESOPHAGOGASTRODUODENOSCOPY (EGD) WITH PROPOFOL ;  Surgeon: Luke Salaam, MD;  Location[facility]COPY;  Service: Gastroenterology;  Laterality: N/A;   KNEE ARTHROPLASTY Right 05/09/2022   Procedure: COMPUTER ASSISTED TOTAL KNEE ARTHROPLASTY - RNFA;  Surgeon: Arlyne Lame, MD;  Location: [facility] ORS;  Service: Orthopedics;  Laterality: Right;   KNEE SURGERY     bilateral   TONSILLECTOMY AND ADENOIDECTOMY     TOTAL SHOULDER REPLACEMENT Right    Family History  Problem Relation Age of Onset   Stroke Mother    Liver disease Father  Lung cancer Sister    Diabetes Sister    Liver disease Brother    Lung cancer Paternal Grandmother    Social History   Socioeconomic History   Marital status: Married    Spouse name: Nancyann Aye   Number of children: Not on file   Years of education: Not on file   Highest education level: Not on file  Occupational History   Not on file  Tobacco Use   Smoking status: Never   Smokeless tobacco: Current    Types: Snuff  Vaping Use   Vaping status: Never Used  Substance and Sexual Activity   Alcohol use: Yes    Alcohol/week: 2.0 - 3.0 standard drinks of alcohol    Types: 2 - 3 Cans of beer per week    Comment: ocassionally   Drug use: No   Sexual activity: Yes  Other Topics Concern   Not on file  Social History Narrative   Not on file   Social Drivers of Health   Financial Resource Strain: Low Risk  (06/21/2023)   Overall Financial Resource Strain (CARDIA)    Difficulty of Paying Living Expenses: Not hard at all  Food Insecurity: No Food Insecurity (06/21/2023)   Hunger Vital Sign    Worried About Running Out of Food in the Last Year: Never true    Ran Out of Food in the Last Year: Never true  Transportation Needs: No Transportation Needs  (06/21/2023)   PRAPARE - Administrator, Civil Service (Medical): No    Lack of Transportation (Non-Medical): No  Physical Activity: Insufficiently Active (06/21/2023)   Exercise Vital Sign    Days of Exercise per Week: 3 days    Minutes of Exercise per Session: 30 min  Stress: No Stress Concern Present (06/21/2023)   [employer] of Occupational Health - Occupational Stress Questionnaire    Feeling of Stress : Only a little  Social Connections: Moderately Integrated (06/21/2023)   Social Connection and Isolation Panel [NHANES]    Frequency of Communication with Friends and Family: More than three times a week    Frequency of Social Gatherings with Friends and Family: More than three times a week    Attends Religious Services: More than 4 times per year    Active Member of [employer] or Organizations: No    Attends Engineer, structural: Never    Marital Status: Married    Tobacco Counseling Ready to quit: Not Answered Counseling given: Not Answered    Clinical Intake:  Pre-visit preparation completed: Yes  Pain : 0-10 Pain Score: 3  Pain Type: Chronic pain Pain Location: Back Pain Orientation: Lower Pain Descriptors / Indicators: Aching, Discomfort, Constant Pain Onset: More than a month ago Pain Frequency: Constant     BMI - recorded: 36.8 Nutritional Status: BMI > 30  Obese Nutritional Risks: None Diabetes: No  Lab Results  Component Value Date   HGBA1C 6.1 (H) 01/22/2023   HGBA1C 5.9 (H) 10/26/2021   HGBA1C 6.0 (H) 05/05/2021     How often do you need to have someone help you when you read instructions, pamphlets, or other written materials from your doctor or pharmacy?: 1 - Never  Interpreter Needed?: No  Information entered by :: Dellie Fergusson, LPN   Activities of Daily Living     06/21/2023    2:07 PM  In your present state of health, do you have any difficulty performing the following activities:  Hearing? 0  Vision? 0   Difficulty  concentrating or making decisions? 0  Walking or climbing stairs? 0  Dressing or bathing? 0  Doing errands, shopping? 0  Preparing Food and eating ? N  Using the Toilet? N  In the past six months, have you accidently leaked urine? N  Do you have problems with loss of bowel control? N  Managing your Medications? N  Managing your Finances? N  Housekeeping or managing your Housekeeping? N    Patient Care Team: Carollynn Cirri, NP as PCP - General (Internal Medicine) Devorah Fonder, MD as PCP - Cardiology [facility]  Indicate any recent Medical Services you may have received from other than [facility] providers in the past year (date may be approximate).     Assessment:    This is a routine wellness examination for Robert Greer.  Hearing/Vision screen Hearing Screening - Comments:: NO AIDS Vision Screening - Comments:: [facility]- [email or web address]   Goals Addressed             This Visit's Progress    DIET - INCREASE WATER INTAKE         Depression Screen     06/21/2023    2:04 PM 01/17/2023    9:18 AM 06/15/2022    3:38 PM 02/15/2022    9:35 AM 05/05/2021    2:55 PM 04/12/2020    1:34 PM 04/22/2019   11:19 AM  PHQ 2/9 Scores  PHQ - 2 Score 0 0 1 4 0 0 0  PHQ- 9 Score 0  2 11 1   0    Fall Risk     06/21/2023    2:07 PM 01/22/2023   11:44 AM 01/17/2023    9:18 AM 06/15/2022    3:41 PM 02/15/2022    9:36 AM  Fall Risk   Falls in the past year? 0 0 0 1 0  Number falls in past yr: 0 1  0   Injury with Fall? 0 0 0 1 0  Risk for fall due to : No Fall Risks   History of fall(s) No Fall Risks  Follow up Falls evaluation completed   Falls prevention discussed;Falls evaluation completed     MEDICARE RISK AT HOME:  Medicare Risk at Home Any stairs in or around the home?: Yes If so, are there any without handrails?: No Home free of loose throw rugs in walkways, pet beds, electrical cords, etc?: Yes Adequate lighting in your home to reduce risk  of falls?: Yes Life alert?: No Use of a cane, walker or w/c?: No Grab bars in the bathroom?: Yes Shower chair or bench in shower?: Yes Elevated toilet seat or a handicapped toilet?: Yes  TIMED UP AND GO:  Was the test performed?  No  Cognitive Function: 6CIT completed        06/21/2023    2:08 PM 06/15/2022    3:46 PM 04/12/2020    1:37 PM 08/21/2018    4:08 PM  6CIT Screen  What Year? 0 points 0 points 0 points 0 points  What month? 0 points 0 points 0 points 0 points  What time? 0 points 0 points 0 points 0 points  Count back from 20 0 points 0 points 0 points 0 points  Months in reverse 0 points 2 points 0 points 2 points  Repeat phrase 2 points 0 points 2 points 0 points  Total Score 2 points 2 points 2 points 2 points    Immunizations Immunization History  Administered Date(s) Administered  Fluad Quad(high Dose 65+) 01/01/2020, 10/07/2020, 10/26/2021   Influenza, Seasonal, Injecte, Preservative Fre 12/06/2022   Influenza,inj,Quad PF,6+ Mos 01/01/2018   Influenza,inj,quad, With Preservative 01/03/2014   Influenza-Unspecified 12/17/2014, 11/03/2015, 01/01/2018, 12/06/2022   Moderna Sars-Covid-2 Vaccination 03/06/2019, 04/03/2019   PNEUMOCOCCAL CONJUGATE-20 12/06/2022   Pneumococcal Conjugate-13 08/21/2018   Pneumococcal Polysaccharide-23 05/05/2021   Tdap 07/02/2017, 02/04/2018   Zoster, Live 05/02/2015    Screening Tests Health Maintenance  Topic Date Due   Zoster Vaccines- Shingrix (1 of 2) 02/24/1972   COVID-19 Vaccine (3 - Moderna risk series) 05/01/2019   INFLUENZA VACCINE  08/23/2023   Medicare Annual Wellness (AWV)  06/20/2024   Colonoscopy  02/15/2027   DTaP/Tdap/Td (3 - Td or Tdap) 02/05/2028   Pneumonia Vaccine 20+ Years old  Completed   Hepatitis C Screening  Completed   HPV VACCINES  Aged Out   Meningococcal B Vaccine  Aged Out    Health Maintenance  Health Maintenance Due  Topic Date Due   Zoster Vaccines- Shingrix (1 of 2) 02/24/1972    COVID-19 Vaccine (3 - Moderna risk series) 05/01/2019   Health Maintenance Items Addressed: UP TO DATE ON SHOTS EXCEPT SHINGRIX; WANTS NO MORE COVIDS; UP TO DATE ON COLONOSCOPY  Additional Screening:  Vision Screening: Recommended annual ophthalmology exams for early detection of glaucoma and other disorders of the eye.  Dental Screening: Recommended annual dental exams for proper oral hygiene  Community Resource Referral / Chronic Care Management: CRR required this visit?  No   CCM required this visit?  No   Plan:    I have personally reviewed and noted the following in the patient's chart:   Medical and social history Use of alcohol, tobacco or illicit drugs  Current medications and supplements including opioid prescriptions. Patient is currently taking opioid prescriptions. Information provided to patient regarding non-opioid alternatives. Patient advised to discuss non-opioid treatment plan with their provider. Functional ability and status Nutritional status Physical activity Advanced directives List of other physicians Hospitalizations, surgeries, and ER visits in previous 12 months Vitals Screenings to include cognitive, depression, and falls Referrals and appointments  In addition, I have reviewed and discussed with patient certain preventive protocols, quality metrics, and best practice recommendations. A written personalized care plan for preventive services as well as general preventive health recommendations were provided to patient.   Pinky Bright, LPN   04/30/8117   After Visit Summary: (MyChart) Due to this being a telephonic visit, the after visit summary with patients personalized plan was offered to patient via MyChart   Notes: Nothing significant to report at this time.

## 2023-07-23 ENCOUNTER — Ambulatory Visit (INDEPENDENT_AMBULATORY_CARE_PROVIDER_SITE_OTHER): Admitting: Internal Medicine

## 2023-07-23 ENCOUNTER — Encounter: Payer: Self-pay | Admitting: Internal Medicine

## 2023-07-23 VITALS — BP 128/72 | Ht 71.0 in | Wt 266.4 lb

## 2023-07-23 DIAGNOSIS — Z6837 Body mass index (BMI) 37.0-37.9, adult: Secondary | ICD-10-CM | POA: Diagnosis not present

## 2023-07-23 DIAGNOSIS — F32A Depression, unspecified: Secondary | ICD-10-CM | POA: Diagnosis not present

## 2023-07-23 DIAGNOSIS — I252 Old myocardial infarction: Secondary | ICD-10-CM

## 2023-07-23 DIAGNOSIS — I10 Essential (primary) hypertension: Secondary | ICD-10-CM

## 2023-07-23 DIAGNOSIS — F419 Anxiety disorder, unspecified: Secondary | ICD-10-CM | POA: Diagnosis not present

## 2023-07-23 DIAGNOSIS — M1A0710 Idiopathic chronic gout, right ankle and foot, without tophus (tophi): Secondary | ICD-10-CM

## 2023-07-23 DIAGNOSIS — I25118 Atherosclerotic heart disease of native coronary artery with other forms of angina pectoris: Secondary | ICD-10-CM | POA: Diagnosis not present

## 2023-07-23 DIAGNOSIS — R7303 Prediabetes: Secondary | ICD-10-CM

## 2023-07-23 DIAGNOSIS — G4733 Obstructive sleep apnea (adult) (pediatric): Secondary | ICD-10-CM

## 2023-07-23 DIAGNOSIS — E66812 Obesity, class 2: Secondary | ICD-10-CM

## 2023-07-23 DIAGNOSIS — K219 Gastro-esophageal reflux disease without esophagitis: Secondary | ICD-10-CM

## 2023-07-23 DIAGNOSIS — M150 Primary generalized (osteo)arthritis: Secondary | ICD-10-CM | POA: Diagnosis not present

## 2023-07-23 DIAGNOSIS — E782 Mixed hyperlipidemia: Secondary | ICD-10-CM

## 2023-07-23 DIAGNOSIS — M961 Postlaminectomy syndrome, not elsewhere classified: Secondary | ICD-10-CM

## 2023-07-23 MED ORDER — OMEPRAZOLE 20 MG PO CPDR: 20.0000 mg | DELAYED_RELEASE_CAPSULE | Freq: Every day | ORAL | 1 refills | Status: DC

## 2023-07-23 NOTE — Assessment & Plan Note (Signed)
 Continue celebrex  200 mg twice daily, duloxetine  60 mg daily, voltaren  gel, pregabalin  75 mg 3 times daily and oxycodone  5 mg every 4 hours as needed for pain management Encourage weight loss as this can help reduce joint pain

## 2023-07-23 NOTE — Assessment & Plan Note (Signed)
 Encouraged diet and exercise for weight loss ?

## 2023-07-23 NOTE — Assessment & Plan Note (Signed)
 C-Met and lipid profile today Continue atorvastatin  20 mg, ezetimibe  10 mg, aspirin  81 mg and plavix  75 mg daily as prescribed Encouraged low fat diet

## 2023-07-23 NOTE — Assessment & Plan Note (Signed)
 C-Met and lipid profile today Continue atorvastatin  20 mg, ezetimibe  10 mg daily Encouraged low fat diet

## 2023-07-23 NOTE — Assessment & Plan Note (Signed)
 Continue duloxetine  60 mg and bupropion  150 mg daily Support offered

## 2023-07-23 NOTE — Assessment & Plan Note (Signed)
Encourage weight loss as this can reduce sleep apnea symptoms Continue CPAP

## 2023-07-23 NOTE — Assessment & Plan Note (Signed)
 Uric acid level reviewed Continue colchicine  0.6 mg as needed

## 2023-07-23 NOTE — Assessment & Plan Note (Signed)
 A1c today Encourage low-carb diet and exercise for weight loss

## 2023-07-23 NOTE — Assessment & Plan Note (Signed)
 Deteriorated Try to identify and avoid foods that trigger reflux Encourage weight loss as this can help reduce reflux symptoms Rx for omeprazole  20 mg daily Okay to continue Rolaids OTC as needed

## 2023-07-23 NOTE — Assessment & Plan Note (Signed)
 Continue valsartan  HCT 160-12.5 mg daily Reinforce DASH diet and exercise for weight loss C-Met today

## 2023-07-23 NOTE — Assessment & Plan Note (Signed)
 Continue celebrex  200 mg twice daily, duloxetine  60 mg daily, voltaren  gel, pregabalin  75 mg 3 times daily and oxycodone  5 mg every 4 hours as needed for pain management Encourage weight loss as this can help reduce joint pain Encouraged regular stretching and core strengthening

## 2023-07-23 NOTE — Assessment & Plan Note (Signed)
 C-Met and lipid profile today Continue atorvastatin  20 mg, ezetimibe  10 mg, aspirin  81 mg and plavix  75 mg daily Encouraged low fat diet

## 2023-07-23 NOTE — Progress Notes (Signed)
 Subjective:    Patient ID: Robert Greer, male    DOB: 1956/01/21, 68 y.o.   MRN: [id number]  HPI  Patient presents to clinic today for follow-up of chronic conditions.  HTN: His BP today is 128/72.  He is taking valsartan  hct as prescribed.  ECG from 02/2023 reviewed.  OSA: He averages 7 hours of sleep per night with the use of his CPAP.  Sleep study from 01/2021 reviewed.  GERD: He is not sure what triggers this. He takes rolaids as needed with good relief of symptoms.  Upper GI from 01/2017 reviewed.  OA/chronic low back pain: Generalized.  He takes celebrex , duloxetine , uses voltaren  gel, pregabalin , oxycodone  as needed with good relief of symptoms.  He does not follow with orthopedics but he follows with pain clinic.  HLD with CAD status post MI: His last LDL was 53, triglycerides 829, 12/2022.  He denies myalgias on atorvastatin , ezetimibe .  He is taking aspirin  and plavix  as well.  Prediabetes: His last A1c was 6.1 %, 12/2022.  He is not taking any oral diabetic medication this time.  He does not check his sugars.  Anxiety and depression: Chronic, managed on duloxetine  and bupropion . He is not currently seeing a therapist. He denies SI/HI.  Gout: He denies recent flare. He has colchicine  to take if needed. He does not follow with rheumatology.  Review of Systems   Past Medical History:  Diagnosis Date   Anxiety    Arthritis    Back pain    CAD (coronary artery disease)    a.) NSTEMI 01/07/2015; b.) LHC/PCI 01/10/2015: LM nl, LAD 85p/m (overlapping 2.75 x 22 mm (pLAD), 2.5 x 22 mm (mLAD), and 2.75 x 12 mm (mLAD) Resolute Integrity DES), D1 50ost, D2/3 small/nl, LCX 60d, OM1 nl, OM2 min irregs, OM3 nl, RCA 170m (likely culprit w/ L->R, R->R collats-->Med Rx). Nl EF.   Complication of anesthesia 2010   a.) cardiac arrest in OR during back surgery done in [place]; never told me why   Depressive disorder, not elsewhere classified    GERD (gastroesophageal reflux disease)    Gout     History of echocardiogram    a. 12/2014 Echo: EF 60-65%, no rwma, mild MR. Nl RV fxn.   Hyperlipidemia    Hypertension    Lipoma of back 02/09/2015   Long term current use of antithrombotics/antiplatelets    a.) on DAPT (ASA + clopidogrel )   Measles    Mobitz (type) II atrioventricular block    Mumps    NSTEMI (non-ST elevated myocardial infarction) (HCC) 01/07/2015   a.) troponins were trended: 0.63 --> 3.16 --> 10.08 --> 9.73; b.) LHC/PCI 01/10/2015 --> overlapping 2.75 x 22 mm (pLAD), 2.5 x 22 mm (mLAD), and 2.75 x 12 mm (mLAD) Resolute Integrity DES   Obesity    OSA treated with BiPAP    Other hemochromatosis 12/05/2016    Current Outpatient Medications  Medication Sig Dispense Refill   aspirin  81 MG chewable tablet Chew 1 tablet (81 mg total) by mouth in the morning and at bedtime. 60 tablet 0   atorvastatin  (LIPITOR) 20 MG tablet Take 1 tablet (20 mg total) by mouth daily. 90 tablet 3   buPROPion  (WELLBUTRIN  XL) 150 MG 24 hr tablet Take 1 tablet (150 mg total) by mouth daily. 90 tablet 1   celecoxib  (CELEBREX ) 200 MG capsule Take 1 capsule (200 mg total) by mouth 2 (two) times daily. 60 capsule 1   cetirizine  (ZYRTEC ) 10 MG tablet  Take 1 tablet (10 mg total) by mouth as needed for allergies. Reported on 02/09/2015 90 tablet 1   clopidogrel  (PLAVIX ) 75 MG tablet Take 1 tablet (75 mg total) by mouth daily. 90 tablet 3   colchicine  0.6 MG tablet Take 2 tabs at the first sign of a gout attack followed by 1 tab 1 hour later. 30 tablet 0   diclofenac  Sodium (VOLTAREN ) 1 % GEL Apply 2 g topically 4 (four) times daily. 100 g 1   DULoxetine  (CYMBALTA ) 60 MG capsule Take 1 capsule (60 mg total) by mouth 2 (two) times daily. (Patient taking differently: Take 60 mg by mouth every morning.) 180 capsule 0   ezetimibe  (ZETIA ) 10 MG tablet Take 1 tablet (10 mg total) by mouth daily. 90 tablet 3   oxyCODONE  (OXY IR/ROXICODONE ) 5 MG immediate release tablet Take 1 tablet (5 mg total) by mouth  every 4 (four) hours as needed for moderate pain (pain score 4-6). 30 tablet 0   pregabalin  (LYRICA ) 75 MG capsule Take 75 mg by mouth 3 (three) times daily.     traMADol  (ULTRAM ) 50 MG tablet Take 1-2 tablets (50-100 mg total) by mouth every 4 (four) hours as needed for moderate pain. (Patient not taking: Reported on 06/21/2023) 30 tablet 0   valsartan -hydrochlorothiazide  (DIOVAN -HCT) 160-12.5 MG tablet Take 1 tablet by mouth daily. 90 tablet 3   No current facility-administered medications for this visit.    Allergies  Allergen Reactions   Levaquin  [Levofloxacin ] Rash    Family History  Problem Relation Age of Onset   Stroke Mother    Liver disease Father    Lung cancer Sister    Diabetes Sister    Liver disease Brother    Lung cancer Paternal Grandmother     Social History   Socioeconomic History   Marital status: Married    Spouse name: Idell   Number of children: Not on file   Years of education: Not on file   Highest education level: Not on file  Occupational History   Not on file  Tobacco Use   Smoking status: Never   Smokeless tobacco: Current    Types: Snuff  Vaping Use   Vaping status: Never Used  Substance and Sexual Activity   Alcohol use: Yes    Alcohol/week: 2.0 - 3.0 standard drinks of alcohol    Types: 2 - 3 Cans of beer per week    Comment: ocassionally   Drug use: No   Sexual activity: Yes  Other Topics Concern   Not on file  Social History Narrative   Not on file   Social Drivers of Health   Financial Resource Strain: Low Risk  (06/21/2023)   Overall Financial Resource Strain (CARDIA)    Difficulty of Paying Living Expenses: Not hard at all  Food Insecurity: No Food Insecurity (06/21/2023)   Hunger Vital Sign    Worried About Running Out of Food in the Last Year: Never true    Ran Out of Food in the Last Year: Never true  Transportation Needs: No Transportation Needs (06/21/2023)   PRAPARE - Administrator, Civil Service (Medical):  No    Lack of Transportation (Non-Medical): No  Physical Activity: Insufficiently Active (06/21/2023)   Exercise Vital Sign    Days of Exercise per Week: 3 days    Minutes of Exercise per Session: 30 min  Stress: No Stress Concern Present (06/21/2023)   [employer] of Occupational Health - Occupational Stress Questionnaire  Feeling of Stress : Only a little  Social Connections: Moderately Integrated (06/21/2023)   Social Connection and Isolation Panel    Frequency of Communication with Friends and Family: More than three times a week    Frequency of Social Gatherings with Friends and Family: More than three times a week    Attends Religious Services: More than 4 times per year    Active Member of [employer] or Organizations: No    Attends Banker Meetings: Never    Marital Status: Married  Catering manager Violence: Not At Risk (06/21/2023)   Humiliation, Afraid, Rape, and Kick questionnaire    Fear of Current or Ex-Partner: No    Emotionally Abused: No    Physically Abused: No    Sexually Abused: No     Constitutional: Denies fever, malaise, fatigue, headache or abrupt weight changes.  HEENT: Denies eye pain, eye redness, ear pain, ringing in the ears, wax buildup, runny nose, nasal congestion, bloody nose, or sore throat. Respiratory: Denies difficulty breathing, shortness of breath, cough or sputum production.   Cardiovascular: Denies chest pain, chest tightness, palpitations or swelling in the hands or feet.  Gastrointestinal: Pt reports intermittent reflux. Denies abdominal pain, bloating, constipation, diarrhea or blood in the stool.  GU: Denies urgency, frequency, pain with urination, burning sensation, blood in urine, odor or discharge. Musculoskeletal: Pt reports chronic low back pain, joint pain. Denies decrease in range of motion, difficulty with gait, muscle pain or joint swelling.  Skin: Denies redness, rashes, lesions or ulcercations.  Neurological: Denies  dizziness, difficulty with memory, difficulty with speech or problems with balance and coordination.  Psych: Pt has a history of anxiety and depression. Denies SI/HI.  No other specific complaints in a complete review of systems (except as listed in HPI above).     Objective:   Physical Exam  BP 128/72 (BP Location: Left Arm, Patient Position: Sitting, Cuff Size: Large)   Ht 5' 11 (1.803 m)   Wt 266 lb 6.4 oz (120.8 kg)   BMI 37.16 kg/m    Wt Readings from Last 3 Encounters:  03/08/23 264 lb 12.8 oz (120.1 kg)  03/04/23 265 lb 2 oz (120.3 kg)  01/22/23 263 lb 12.8 oz (119.7 kg)    General: Appears his stated age, obese, in NAD. Skin: Warm, dry and intact.  HEENT: Head: normal shape and size; Eyes: sclera white, no icterus, conjunctiva pink, PERRLA and EOMs intact;  Cardiovascular: Normal rate and rhythm. S1,S2 noted.  No murmur, rubs or gallops noted. No JVD or BLE edema. No carotid bruits noted. Pulmonary/Chest: Normal effort and positive vesicular breath sounds. No respiratory distress. No wheezes, rales or ronchi noted.  Abdomen: Normal bowel sounds.  Musculoskeletal: Joint enlargement noted of bilateral hands.  Status post bilateral knee replacement.  No difficulty with gait.  Neurological: Alert and oriented.  Psych: Mood and affect normal.  Behavior is normal.  Judgment and thought content normal.  BMET    Component Value Date/Time   NA 140 01/22/2023 1147   NA 141 02/02/2019 0834   K 4.2 01/22/2023 1147   CL 103 01/22/2023 1147   CO2 25 01/22/2023 1147   GLUCOSE 95 01/22/2023 1147   BUN 19 01/22/2023 1147   BUN 18 02/02/2019 0834   CREATININE 1.09 01/22/2023 1147   CALCIUM  9.2 01/22/2023 1147   GFRNONAA >60 05/01/2022 1517   GFRNONAA 62 01/01/2020 1549   GFRAA 72 01/01/2020 1549    Lipid Panel     Component  Value Date/Time   CHOL 135 01/22/2023 1147   CHOL 149 02/02/2019 0834   TRIG 170 (H) 01/22/2023 1147   HDL 57 01/22/2023 1147   HDL 67 02/02/2019  0834   CHOLHDL 2.4 01/22/2023 1147   VLDL 23 01/08/2015 0225   LDLCALC 53 01/22/2023 1147    CBC    Component Value Date/Time   WBC 9.7 01/22/2023 1147   RBC 4.74 01/22/2023 1147   HGB 15.4 01/22/2023 1147   HCT 45.1 01/22/2023 1147   PLT 328 01/22/2023 1147   MCV 95.1 01/22/2023 1147   MCH 32.5 01/22/2023 1147   MCHC 34.1 01/22/2023 1147   RDW 11.8 01/22/2023 1147   LYMPHSABS 1,554 01/01/2020 1549   MONOABS 0.6 08/27/2018 1501   EOSABS 333 01/01/2020 1549   BASOSABS 37 01/01/2020 1549    Hgb A1C Lab Results  Component Value Date   HGBA1C 6.1 (H) 01/22/2023           Assessment & Plan:     RTC in 6 months, follow up chronic conditions Angeline Laura, NP

## 2023-07-23 NOTE — Patient Instructions (Signed)
 GERD in Adults: Diet Changes When you have gastroesophageal reflux disease (GERD), you may need to make changes to your diet. Choosing the right foods can help with your symptoms. Think about working with an expert in healthy eating called a dietitian. They can help you make healthy food choices. What are tips for following this plan? Reading food labels Look for foods that are low in saturated fat. Foods that may help with your symptoms include: Foods with less than 5% of daily value (DV) of fat. Foods with 0 grams of trans fat. Cooking [employer] in ways that don't use a lot of fat. These ways include: Baking. Steaming. Grilling. Broiling. To add flavor, try to use herbs that are low in spice and acidity. Avoid frying your food. Meal planning  Eat small meals often rather than eating 3 large meals each day. Eat your meals slowly in a place where you feel relaxed. If told by your health care provider, avoid: Foods that cause symptoms. Keep a food diary to keep track of foods that cause symptoms. Alcohol. Drinking a lot of liquid with meals. General instructions For 2-3 hours after you eat, avoid: Bending over. Exercise. Lying down. Chew sugar-free gum after meals. What foods should I eat? Eat a healthy diet. Try to include: Foods with high amounts of fiber. These include: Fruits and vegetables. Whole grains and beans. Low-fat dairy products. Lean meats, fish, and poultry. Egg whites. Foods that cause symptoms in someone else may not cause symptoms for you. Work with your provider to find foods that are safe for you. The items listed above may not be all the foods and drinks you can have. Talk with a dietitian to learn more. The items listed above may not be a complete list of foods and beverages you can eat and drink. Contact a dietitian for more information. What foods should I avoid? Limiting some of these foods may help with your symptoms. Each person is different.  Talk with a dietitian or your provider to help you find the exact foods to avoid. Some of the foods to avoid may include: Fruits Fruits with a lot of acid in them. These may include citrus fruits, such as oranges, grapefruit, pineapple, and lemons. Vegetables Deep-fried vegetables, such as [place] fries. Vegetables, sauces, or toppings made with added fat and vegetables with acid in them. These may include tomatoes and tomato products, chili peppers, onions, garlic, and horseradish. Grains Pastries or quick breads with added fat. Meats and other proteins High-fat meats, such as fatty beef or pork, hot dogs, ribs, ham, sausage, salami, and bacon. Fried meat or protein, such as fried fish and fried chicken. Egg yolks. Fats and oils Butter. Margarine. Shortening. Ghee. Drinks Coffee and other drinks with caffeine in them. Fizzy and sugary drinks, such as soda and energy drinks. Fruit juice made with acidic fruits, such as orange or grapefruit. Tomato juice. Sweets and desserts Chocolate and cocoa. Donuts. Seasonings and condiments Mint, such as peppermint and spearmint. Condiments, herbs, or seasonings that cause symptoms. These may include curry, hot sauce, or vinegar-based salad dressings. The items listed above may not be all the foods and drinks you should avoid. Talk with a dietitian to learn more. Questions to ask your health care provider Changes to your diet and everyday life are often the first steps taken to manage symptoms of GERD. If these changes don't help, talk with your provider about taking medicines. Where to find more information International Foundation for Gastrointestinal Disorders:  aboutgerd.org This information is not intended to replace advice given to you by your health care provider. Make sure you discuss any questions you have with your health care provider. Document Revised: 11/20/2022 Document Reviewed: 06/06/2022 Elsevier Patient Education  2024 [employer].

## 2023-07-24 ENCOUNTER — Ambulatory Visit: Payer: Self-pay | Admitting: Internal Medicine

## 2023-07-24 LAB — COMPREHENSIVE METABOLIC PANEL WITH GFR
AG Ratio: 1.9 (calc) (ref 1.0–2.5)
ALT: 21 U/L (ref 9–46)
AST: 20 U/L (ref 10–35)
Albumin: 4.7 g/dL (ref 3.6–5.1)
Alkaline phosphatase (APISO): 72 U/L (ref 35–144)
BUN: 15 mg/dL (ref 7–25)
CO2: 29 mmol/L (ref 20–32)
Calcium: 9.6 mg/dL (ref 8.6–10.3)
Chloride: 99 mmol/L (ref 98–110)
Creat: 1.11 mg/dL (ref 0.70–1.28)
Globulin: 2.5 g/dL (ref 1.9–3.7)
Glucose, Bld: 125 mg/dL (ref 65–139)
Potassium: 4.2 mmol/L (ref 3.5–5.3)
Sodium: 138 mmol/L (ref 135–146)
Total Bilirubin: 0.4 mg/dL (ref 0.2–1.2)
Total Protein: 7.2 g/dL (ref 6.1–8.1)
eGFR: 71 mL/min/1.73m2 (ref >=60)

## 2023-07-24 LAB — CBC
HCT: 47 % (ref 38.5–50.0)
Hemoglobin: 15.3 g/dL (ref 13.2–17.1)
MCH: 31.3 pg (ref 27.0–33.0)
MCHC: 32.6 g/dL (ref 32.0–36.0)
MCV: 96.1 fL (ref 80.0–100.0)
MPV: 9.3 fL (ref 7.5–12.5)
Platelets: 309 10*3/uL (ref 140–400)
RBC: 4.89 10*6/uL (ref 4.20–5.80)
RDW: 12.1 % (ref 11.0–15.0)
WBC: 8.6 Thousand/uL (ref 3.8–10.8)

## 2023-07-24 LAB — HEMOGLOBIN A1C
Hgb A1c MFr Bld: 5.9 % — ABNORMAL HIGH (ref <5.7)
Mean Plasma Glucose: 123 mg/dL
eAG (mmol/L): 6.8 mmol/L

## 2023-07-24 LAB — LIPID PANEL
Cholesterol: 147 mg/dL (ref <200)
HDL: 56 mg/dL (ref >=40)
LDL Cholesterol (Calc): 68 mg/dL
Non-HDL Cholesterol (Calc): 91 mg/dL (ref <130)
Total CHOL/HDL Ratio: 2.6 (calc) (ref <5.0)
Triglycerides: 151 mg/dL — ABNORMAL HIGH (ref <150)

## 2023-09-06 DIAGNOSIS — Z79891 Long term (current) use of opiate analgesic: Secondary | ICD-10-CM | POA: Diagnosis not present

## 2023-09-06 DIAGNOSIS — M542 Cervicalgia: Secondary | ICD-10-CM | POA: Diagnosis not present

## 2023-09-06 DIAGNOSIS — M5416 Radiculopathy, lumbar region: Secondary | ICD-10-CM | POA: Diagnosis not present

## 2023-09-06 DIAGNOSIS — Z5181 Encounter for therapeutic drug level monitoring: Secondary | ICD-10-CM | POA: Diagnosis not present

## 2023-09-06 DIAGNOSIS — Z1331 Encounter for screening for depression: Secondary | ICD-10-CM | POA: Diagnosis not present

## 2023-09-06 DIAGNOSIS — G894 Chronic pain syndrome: Secondary | ICD-10-CM | POA: Diagnosis not present

## 2023-09-06 DIAGNOSIS — M1711 Unilateral primary osteoarthritis, right knee: Secondary | ICD-10-CM | POA: Diagnosis not present

## 2023-09-06 DIAGNOSIS — M961 Postlaminectomy syndrome, not elsewhere classified: Secondary | ICD-10-CM | POA: Diagnosis not present

## 2023-09-10 ENCOUNTER — Encounter: Payer: Self-pay | Admitting: Internal Medicine

## 2023-09-10 ENCOUNTER — Ambulatory Visit (INDEPENDENT_AMBULATORY_CARE_PROVIDER_SITE_OTHER): Admitting: Internal Medicine

## 2023-09-10 VITALS — BP 120/70 | HR 79 | Ht 71.0 in | Wt 267.0 lb

## 2023-09-10 DIAGNOSIS — H6992 Unspecified Eustachian tube disorder, left ear: Secondary | ICD-10-CM

## 2023-09-10 DIAGNOSIS — B029 Zoster without complications: Secondary | ICD-10-CM | POA: Diagnosis not present

## 2023-09-10 MED ORDER — VALACYCLOVIR HCL 1 G PO TABS: 1000.0000 mg | ORAL_TABLET | Freq: Three times a day (TID) | ORAL | 0 refills | Status: DC

## 2023-09-10 NOTE — Patient Instructions (Signed)
 Shingles  Shingles, or herpes zoster, is an infection. It gives you a skin rash and blisters. These infected areas may hurt a lot. Shingles only happens if: You've had chickenpox. You've been given a shot called a vaccine to protect you from getting chickenpox. Shingles is rare in this case. What are the causes? Shingles is caused by a germ called the varicella-zoster virus. This is the same germ that causes chickenpox. After you're exposed to the germ, it stays in your body but is dormant. This means it isn't active. Shingles happens if the germ becomes active again. This can happen years after you're first exposed to the germ. What increases the risk? You may be more likely to get shingles if: You're older than 70 years of age. You're under a lot of stress. You have a weak immune system. The immune system is your body's defense system. It may be weak if: You have human immunodeficiency virus (HIV). You have acquired immunodeficiency syndrome (AIDS). You have cancer. You take medicines that weaken your immune system. These include organ transplant medicines. What are the signs or symptoms? The first symptoms of shingles may be itching, tingling, or pain. Your skin may feel like it's burning. A few days or weeks later, you'll get a rash. Here's what you can expect: The rash is likely to be on one side of your body. The rash may be shaped like a belt or a band. Over time, it will turn into blisters filled with fluid. The blisters will break open and change into scabs. The scabs will dry up in about 2-3 weeks. You may also have: A fever. Chills. A headache. Nausea. How is this diagnosed? Shingles is diagnosed with a skin exam. A sample called a culture may be taken from one of your blisters and sent to a lab. This will show if you have shingles. How is this treated? The rash may last for several weeks. There's no cure for shingles, but your health care provider may give you medicines.  These medicines may: Help with pain. Help with itching. Help with irritation and swelling. Help you get better sooner. Help to prevent long-term problems. If the rash is on your face, you may need to see an eye doctor or an ear, nose, and throat (ENT) doctor. Follow these instructions at home: Medicines Take your medicines only as told by your provider. Put an anti-itch cream or numbing cream on the rash or blisters as told by your provider. Relieving itching and discomfort  To help with itching: Put cold, wet cloths called cold compresses on the rash or blisters. Take a cool bath. Try adding baking soda or dry oatmeal to the water. Do not bathe in hot water. Use calamine lotion on the rash or blisters. You can get this type of lotion at the store. Blister and rash care Keep your rash covered with a loose bandage. Wear loose clothes that don't rub on your rash. Take care of your rash as told by your provider. Make sure you: Wash your hands with soap and water for at least 70 seconds before and after you change your bandage. If you can't use soap and water, use hand sanitizer. Keep your rash and blisters clean by washing them with mild soap and cool water. Change your bandage. Check your rash every day for signs of infection. Check for: More redness, swelling, or pain. Fluid or blood. Warmth. Pus or a bad smell. Do not scratch your rash. Do not pick at your  blisters. To help you not scratch: Keep your fingernails clean and cut short. Try to wear gloves or mittens when you sleep. General instructions Rest. Wash your hands often with soap and water for at least 70 seconds. If you can't use soap and water, use hand sanitizer. Washing your hands lowers your chance of getting a skin infection. Your infection can cause chickenpox in others. If you have blisters that aren't scabs yet, stay away from: Babies. Pregnant people. Children who have eczema. Older people who have organ  transplants. People who have a long-term, or chronic, illness. Anyone who hasn't had chickenpox before. Anyone who hasn't gotten the chickenpox vaccine. How is this prevented? Vaccines are the best way to prevent you from getting chickenpox or shingles. Talk with your provider about getting these shots. Where to find more information Centers for Disease Control and Prevention (CDC): TonerPromos.no Contact a health care provider if: Your pain doesn't get better with medicine. Your pain doesn't get better after the rash heals. You have any signs of infection around the rash. Your rash or blisters get worse. You have a fever or chills. Get help right away if: The rash is on your face or nose. You have pain in your face or by your eye. You lose feeling on one side of your face. You have trouble seeing. You have ear pain or ringing in your ear. This information is not intended to replace advice given to you by your health care provider. Make sure you discuss any questions you have with your health care provider. Document Revised: 10/11/2022 Document Reviewed: 02/23/2022 Elsevier Patient Education  2024 [employer].

## 2023-09-10 NOTE — Progress Notes (Signed)
 Subjective:    Patient ID: Robert Greer, male    DOB: 1955-05-22, 68 y.o.   MRN: [id number]  HPI  Discussed the use of AI scribe software for clinical note transcription with the patient, who gave verbal consent to proceed.  Robert Greer is a 68 year old male who presents with left-sided ear pain and headache.  He has been experiencing popping in his left ear, accompanied by a headache on the left side of his head. The headache is described as 'weird' and tender, extending to the top of his head and around his left eye. He notes that he has never felt anything quite like this before.  The symptoms began around Friday, with a noticeable 'big old knot' on Sunday morning. He inquires about the presence of a gland in the area, indicating concern about a possible infection. No significant pressure or pain, vision changes, fever, chills, or body aches, although he did not measure his temperature. He reports a 'runny nose' and a little bit of sore throat but no significant cough.  He has been taking Sudafed, which he notes 'didn't hurt' but does not specify any significant relief from symptoms.  He mentions a rash on his head that has been present for a while, prior to the onset of his current symptoms. He describes it as a dry scalp and notes it has been there for some time.       Review of Systems   Past Medical History:  Diagnosis Date   Anxiety    Arthritis    Back pain    CAD (coronary artery disease)    a.) NSTEMI 01/07/2015; b.) LHC/PCI 01/10/2015: LM nl, LAD 85p/m (overlapping 2.75 x 22 mm (pLAD), 2.5 x 22 mm (mLAD), and 2.75 x 12 mm (mLAD) Resolute Integrity DES), D1 50ost, D2/3 small/nl, LCX 60d, OM1 nl, OM2 min irregs, OM3 nl, RCA 132m (likely culprit w/ L->R, R->R collats-->Med Rx). Nl EF.   Complication of anesthesia 2010   a.) cardiac arrest in OR during back surgery done in [place]; never told me why   Depressive disorder, not elsewhere classified    GERD (gastroesophageal  reflux disease)    Gout    History of echocardiogram    a. 12/2014 Echo: EF 60-65%, no rwma, mild MR. Nl RV fxn.   Hyperlipidemia    Hypertension    Lipoma of back 02/09/2015   Long term current use of antithrombotics/antiplatelets    a.) on DAPT (ASA + clopidogrel )   Measles    Mobitz (type) II atrioventricular block    Mumps    NSTEMI (non-ST elevated myocardial infarction) (HCC) 01/07/2015   a.) troponins were trended: 0.63 --> 3.16 --> 10.08 --> 9.73; b.) LHC/PCI 01/10/2015 --> overlapping 2.75 x 22 mm (pLAD), 2.5 x 22 mm (mLAD), and 2.75 x 12 mm (mLAD) Resolute Integrity DES   Obesity    OSA treated with BiPAP    Other hemochromatosis 12/05/2016    Current Outpatient Medications  Medication Sig Dispense Refill   aspirin  81 MG chewable tablet Chew 1 tablet (81 mg total) by mouth in the morning and at bedtime. 60 tablet 0   atorvastatin  (LIPITOR) 20 MG tablet Take 1 tablet (20 mg total) by mouth daily. 90 tablet 3   buPROPion  (WELLBUTRIN  XL) 150 MG 24 hr tablet Take 1 tablet (150 mg total) by mouth daily. 90 tablet 1   celecoxib  (CELEBREX ) 200 MG capsule Take 1 capsule (200 mg total) by mouth 2 (two)  times daily. 60 capsule 1   cetirizine  (ZYRTEC ) 10 MG tablet Take 1 tablet (10 mg total) by mouth as needed for allergies. Reported on 02/09/2015 90 tablet 1   clopidogrel  (PLAVIX ) 75 MG tablet Take 1 tablet (75 mg total) by mouth daily. 90 tablet 3   colchicine  0.6 MG tablet Take 2 tabs at the first sign of a gout attack followed by 1 tab 1 hour later. 30 tablet 0   diclofenac  Sodium (VOLTAREN ) 1 % GEL Apply 2 g topically 4 (four) times daily. 100 g 1   DULoxetine  (CYMBALTA ) 60 MG capsule Take 1 capsule (60 mg total) by mouth 2 (two) times daily. (Patient taking differently: Take 60 mg by mouth every morning.) 180 capsule 0   ezetimibe  (ZETIA ) 10 MG tablet Take 1 tablet (10 mg total) by mouth daily. 90 tablet 3   omeprazole  (PRILOSEC) 20 MG capsule Take 1 capsule (20 mg total) by mouth  daily. 90 capsule 1   oxyCODONE  (OXY IR/ROXICODONE ) 5 MG immediate release tablet Take 1 tablet (5 mg total) by mouth every 4 (four) hours as needed for moderate pain (pain score 4-6). 30 tablet 0   pregabalin  (LYRICA ) 75 MG capsule Take 75 mg by mouth 3 (three) times daily.     valsartan -hydrochlorothiazide  (DIOVAN -HCT) 160-12.5 MG tablet Take 1 tablet by mouth daily. 90 tablet 3   No current facility-administered medications for this visit.    Allergies  Allergen Reactions   Levaquin  [Levofloxacin ] Rash    Family History  Problem Relation Age of Onset   Stroke Mother    Liver disease Father    Lung cancer Sister    Diabetes Sister    Liver disease Brother    Lung cancer Paternal Grandmother     Social History   Socioeconomic History   Marital status: Married    Spouse name: Idell   Number of children: Not on file   Years of education: Not on file   Highest education level: Not on file  Occupational History   Not on file  Tobacco Use   Smoking status: Never   Smokeless tobacco: Current    Types: Snuff  Vaping Use   Vaping status: Never Used  Substance and Sexual Activity   Alcohol use: Yes    Alcohol/week: 2.0 - 3.0 standard drinks of alcohol    Types: 2 - 3 Cans of beer per week    Comment: ocassionally   Drug use: No   Sexual activity: Yes  Other Topics Concern   Not on file  Social History Narrative   Not on file   Social Drivers of Health   Financial Resource Strain: Low Risk  (06/21/2023)   Overall Financial Resource Strain (CARDIA)    Difficulty of Paying Living Expenses: Not hard at all  Food Insecurity: No Food Insecurity (06/21/2023)   Hunger Vital Sign    Worried About Running Out of Food in the Last Year: Never true    Ran Out of Food in the Last Year: Never true  Transportation Needs: No Transportation Needs (06/21/2023)   PRAPARE - Administrator, Civil Service (Medical): No    Lack of Transportation (Non-Medical): No  Physical  Activity: Insufficiently Active (06/21/2023)   Exercise Vital Sign    Days of Exercise per Week: 3 days    Minutes of Exercise per Session: 30 min  Stress: No Stress Concern Present (06/21/2023)   [employer] of Occupational Health - Occupational Stress Questionnaire  Feeling of Stress : Only a little  Social Connections: Moderately Integrated (06/21/2023)   Social Connection and Isolation Panel    Frequency of Communication with Friends and Family: More than three times a week    Frequency of Social Gatherings with Friends and Family: More than three times a week    Attends Religious Services: More than 4 times per year    Active Member of [employer] or Organizations: No    Attends Banker Meetings: Never    Marital Status: Married  Catering manager Violence: Not At Risk (06/21/2023)   Humiliation, Afraid, Rape, and Kick questionnaire    Fear of Current or Ex-Partner: No    Emotionally Abused: No    Physically Abused: No    Sexually Abused: No     Constitutional: Pt reports headache. Denies fever, or abrupt weight changes.  HEENT: Pt reports runny nose, left ear pain and mild sore throat. Denies eye pain, eye redness, ear pain, ringing in the ears, wax buildup, bloody nose. Respiratory: Denies difficulty breathing, shortness of breath, cough or sputum production.   Cardiovascular: Denies chest pain, chest tightness, palpitations or swelling in the hands or feet.  Gastrointestinal: Denies abdominal pain, bloating, constipation, diarrhea or blood in the stool.   No other specific complaints in a complete review of systems (except as listed in HPI above).      Objective:   Physical Exam  BP 120/70 (BP Location: Left Arm, Patient Position: Sitting, Cuff Size: Small)   Pulse 79   Ht 5' 11 (1.803 m)   Wt 267 lb (121.1 kg)   SpO2 96%   BMI 37.24 kg/m   Wt Readings from Last 3 Encounters:  07/23/23 266 lb 6.4 oz (120.8 kg)  03/08/23 264 lb 12.8 oz (120.1 kg)   03/04/23 265 lb 2 oz (120.3 kg)    General: Appears his stated age, obese in NAD. Skin: Grouped, scabbed lesions on erythematous base noted at the hairline of the left side of the scalp as well as at the left upper eyelid HEENT: Head: normal shape and size, no sinus pressure noted; Eyes: sclera white, no icterus, conjunctiva pink, PERRLA and EOMs intact; Left ear: TM grey and intact, + serous effusion noted Neck: Occipital adenopathy noted on the left. Cardiovascular: Normal rate and rhythm. Pulmonary/Chest: Normal effort and positive vesicular breath sounds. No respiratory distress. No wheezes, rales or ronchi noted.  Neurological: Alert and oriented.   BMET    Component Value Date/Time   NA 138 07/23/2023 1408   NA 141 02/02/2019 0834   K 4.2 07/23/2023 1408   CL 99 07/23/2023 1408   CO2 29 07/23/2023 1408   GLUCOSE 125 07/23/2023 1408   BUN 15 07/23/2023 1408   BUN 18 02/02/2019 0834   CREATININE 1.11 07/23/2023 1408   CALCIUM  9.6 07/23/2023 1408   GFRNONAA >60 05/01/2022 1517   GFRNONAA 62 01/01/2020 1549   GFRAA 72 01/01/2020 1549    Lipid Panel     Component Value Date/Time   CHOL 147 07/23/2023 1408   CHOL 149 02/02/2019 0834   TRIG 151 (H) 07/23/2023 1408   HDL 56 07/23/2023 1408   HDL 67 02/02/2019 0834   CHOLHDL 2.6 07/23/2023 1408   VLDL 23 01/08/2015 0225   LDLCALC 68 07/23/2023 1408    CBC    Component Value Date/Time   WBC 8.6 07/23/2023 1408   RBC 4.89 07/23/2023 1408   HGB 15.3 07/23/2023 1408   HCT 47.0 07/23/2023 1408  PLT 309 07/23/2023 1408   MCV 96.1 07/23/2023 1408   MCH 31.3 07/23/2023 1408   MCHC 32.6 07/23/2023 1408   RDW 12.1 07/23/2023 1408   LYMPHSABS 1,554 01/01/2020 1549   MONOABS 0.6 08/27/2018 1501   EOSABS 333 01/01/2020 1549   BASOSABS 37 01/01/2020 1549    Hgb A1C Lab Results  Component Value Date   HGBA1C 5.9 (H) 07/23/2023            Assessment & Plan:   Assessment and Plan    Shingles (herpes zoster),  left cranial dermatome with nervous system involvement Shingles with left cranial dermatome involvement, causing headache, tenderness, and swelling. Previous vaccination may reduce severity. No hearing loss, but nerve involvement causes discomfort and potential deafness risk. - Prescribed Valtrex1000 mg three times daily for seven days.  ETD left: Chronic sinus congestion with ear fluid causing popping. No acute ear infection. Symptoms include rhinorrhea and nasal congestion. - Recommended daily Flonase  use.        RTC in 5 months for follow-up of chronic conditions Angeline Laura, NP

## 2023-10-21 DIAGNOSIS — Z23 Encounter for immunization: Secondary | ICD-10-CM | POA: Diagnosis not present

## 2023-10-23 ENCOUNTER — Other Ambulatory Visit: Payer: Self-pay | Admitting: Internal Medicine

## 2023-10-27 DIAGNOSIS — M5416 Radiculopathy, lumbar region: Secondary | ICD-10-CM | POA: Diagnosis not present

## 2023-11-25 DIAGNOSIS — B351 Tinea unguium: Secondary | ICD-10-CM | POA: Diagnosis not present

## 2023-11-25 DIAGNOSIS — L578 Other skin changes due to chronic exposure to nonionizing radiation: Secondary | ICD-10-CM | POA: Diagnosis not present

## 2023-11-25 DIAGNOSIS — L853 Xerosis cutis: Secondary | ICD-10-CM | POA: Diagnosis not present

## 2023-11-25 DIAGNOSIS — Z85828 Personal history of other malignant neoplasm of skin: Secondary | ICD-10-CM | POA: Diagnosis not present

## 2023-11-25 DIAGNOSIS — L570 Actinic keratosis: Secondary | ICD-10-CM | POA: Diagnosis not present

## 2023-11-25 DIAGNOSIS — Z859 Personal history of malignant neoplasm, unspecified: Secondary | ICD-10-CM | POA: Diagnosis not present

## 2023-11-29 DIAGNOSIS — M961 Postlaminectomy syndrome, not elsewhere classified: Secondary | ICD-10-CM | POA: Diagnosis not present

## 2023-11-29 DIAGNOSIS — M542 Cervicalgia: Secondary | ICD-10-CM | POA: Diagnosis not present

## 2023-11-29 DIAGNOSIS — M5416 Radiculopathy, lumbar region: Secondary | ICD-10-CM | POA: Diagnosis not present

## 2023-11-29 DIAGNOSIS — Z5181 Encounter for therapeutic drug level monitoring: Secondary | ICD-10-CM | POA: Diagnosis not present

## 2023-11-29 DIAGNOSIS — Z79891 Long term (current) use of opiate analgesic: Secondary | ICD-10-CM | POA: Diagnosis not present

## 2023-11-29 DIAGNOSIS — M1711 Unilateral primary osteoarthritis, right knee: Secondary | ICD-10-CM | POA: Diagnosis not present

## 2023-11-29 DIAGNOSIS — G894 Chronic pain syndrome: Secondary | ICD-10-CM | POA: Diagnosis not present

## 2023-12-24 ENCOUNTER — Ambulatory Visit: Admitting: Internal Medicine

## 2023-12-24 ENCOUNTER — Encounter: Payer: Self-pay | Admitting: Internal Medicine

## 2023-12-24 VITALS — BP 122/74 | HR 83 | Ht 71.0 in | Wt 266.4 lb

## 2023-12-24 DIAGNOSIS — Z024 Encounter for examination for driving license: Secondary | ICD-10-CM

## 2023-12-24 NOTE — Progress Notes (Signed)
 Commercial Driver Medical Examination   Robert Greer is a 68 y.o. male who presents today for a commercial driver fitness determination physical exam. The patient reports no problems. The following portions of the patient's history were reviewed and updated as appropriate: allergies, current medications, past family history, past medical history, past social history, past surgical history, and problem list. Review of Systems  Past Medical History:  Diagnosis Date   Anxiety    Arthritis    Back pain    CAD (coronary artery disease)    a.) NSTEMI 01/07/2015; b.) LHC/PCI 01/10/2015: LM nl, LAD 85p/m (overlapping 2.75 x 22 mm (pLAD), 2.5 x 22 mm (mLAD), and 2.75 x 12 mm (mLAD) Resolute Integrity DES), D1 50ost, D2/3 small/nl, LCX 60d, OM1 nl, OM2 min irregs, OM3 nl, RCA 12m (likely culprit w/ L->R, R->R collats-->Med Rx). Nl EF.   Complication of anesthesia 2010   a.) cardiac arrest in OR during back surgery done in [place]; never told me why   Depressive disorder, not elsewhere classified    GERD (gastroesophageal reflux disease)    Gout    History of echocardiogram    a. 12/2014 Echo: EF 60-65%, no rwma, mild MR. Nl RV fxn.   Hyperlipidemia    Hypertension    Lipoma of back 02/09/2015   Long term current use of antithrombotics/antiplatelets    a.) on DAPT (ASA + clopidogrel )   Measles    Mobitz (type) II atrioventricular block    Mumps    NSTEMI (non-ST elevated myocardial infarction) (HCC) 01/07/2015   a.) troponins were trended: 0.63 --> 3.16 --> 10.08 --> 9.73; b.) LHC/PCI 01/10/2015 --> overlapping 2.75 x 22 mm (pLAD), 2.5 x 22 mm (mLAD), and 2.75 x 12 mm (mLAD) Resolute Integrity DES   Obesity    OSA treated with BiPAP    Other hemochromatosis 12/05/2016    Current Outpatient Medications  Medication Sig Dispense Refill   aspirin  81 MG chewable tablet Chew 1 tablet (81 mg total) by mouth in the morning and at bedtime. 60 tablet 0   atorvastatin  (LIPITOR) 20 MG tablet Take 1  tablet (20 mg total) by mouth daily. 90 tablet 3   buPROPion  (WELLBUTRIN  XL) 150 MG 24 hr tablet Take 1 tablet (150 mg total) by mouth daily. 90 tablet 0   celecoxib  (CELEBREX ) 200 MG capsule Take 1 capsule (200 mg total) by mouth 2 (two) times daily. 60 capsule 1   cetirizine  (ZYRTEC ) 10 MG tablet Take 1 tablet (10 mg total) by mouth as needed for allergies. Reported on 02/09/2015 90 tablet 1   clopidogrel  (PLAVIX ) 75 MG tablet Take 1 tablet (75 mg total) by mouth daily. 90 tablet 3   colchicine  0.6 MG tablet Take 2 tabs at the first sign of a gout attack followed by 1 tab 1 hour later. 30 tablet 0   diclofenac  Sodium (VOLTAREN ) 1 % GEL Apply 2 g topically 4 (four) times daily. 100 g 1   DULoxetine  (CYMBALTA ) 60 MG capsule Take 1 capsule (60 mg total) by mouth 2 (two) times daily. 180 capsule 0   ezetimibe  (ZETIA ) 10 MG tablet Take 1 tablet (10 mg total) by mouth daily. 90 tablet 3   omeprazole  (PRILOSEC) 20 MG capsule Take 1 capsule (20 mg total) by mouth daily. 90 capsule 1   oxyCODONE  (OXY IR/ROXICODONE ) 5 MG immediate release tablet Take 1 tablet (5 mg total) by mouth every 4 (four) hours as needed for moderate pain (pain score 4-6). 30 tablet 0  pregabalin  (LYRICA ) 75 MG capsule Take 75 mg by mouth 3 (three) times daily.     valACYclovir  (VALTREX ) 1000 MG tablet Take 1 tablet (1,000 mg total) by mouth 3 (three) times daily. For shingles 21 tablet 0   valsartan -hydrochlorothiazide  (DIOVAN -HCT) 160-12.5 MG tablet Take 1 tablet by mouth daily. 90 tablet 3   No current facility-administered medications for this visit.    Allergies  Allergen Reactions   Levaquin  [Levofloxacin ] Rash    Family History  Problem Relation Age of Onset   Stroke Mother    Liver disease Father    Lung cancer Sister    Diabetes Sister    Liver disease Brother    Lung cancer Paternal Grandmother     Social History   Socioeconomic History   Marital status: Married    Spouse name: Idell   Number of children:  Not on file   Years of education: Not on file   Highest education level: Not on file  Occupational History   Not on file  Tobacco Use   Smoking status: Never   Smokeless tobacco: Current    Types: Snuff  Vaping Use   Vaping status: Never Used  Substance and Sexual Activity   Alcohol use: Yes    Alcohol/week: 2.0 - 3.0 standard drinks of alcohol    Types: 2 - 3 Cans of beer per week    Comment: ocassionally   Drug use: No   Sexual activity: Yes  Other Topics Concern   Not on file  Social History Narrative   Not on file   Social Drivers of Health   Financial Resource Strain: Low Risk  (06/21/2023)   Overall Financial Resource Strain (CARDIA)    Difficulty of Paying Living Expenses: Not hard at all  Food Insecurity: No Food Insecurity (06/21/2023)   Hunger Vital Sign    Worried About Running Out of Food in the Last Year: Never true    Ran Out of Food in the Last Year: Never true  Transportation Needs: No Transportation Needs (06/21/2023)   PRAPARE - Administrator, Civil Service (Medical): No    Lack of Transportation (Non-Medical): No  Physical Activity: Insufficiently Active (06/21/2023)   Exercise Vital Sign    Days of Exercise per Week: 3 days    Minutes of Exercise per Session: 30 min  Stress: No Stress Concern Present (06/21/2023)   [employer] of Occupational Health - Occupational Stress Questionnaire    Feeling of Stress : Only a little  Social Connections: Moderately Integrated (06/21/2023)   Social Connection and Isolation Panel    Frequency of Communication with Friends and Family: More than three times a week    Frequency of Social Gatherings with Friends and Family: More than three times a week    Attends Religious Services: More than 4 times per year    Active Member of [employer] or Organizations: No    Attends Banker Meetings: Never    Marital Status: Married  Catering Manager Violence: Not At Risk (06/21/2023)   Humiliation, Afraid,  Rape, and Kick questionnaire    Fear of Current or Ex-Partner: No    Emotionally Abused: No    Physically Abused: No    Sexually Abused: No     Constitutional: Denies fever, malaise, fatigue, headache or abrupt weight changes.  HEENT: Denies eye pain, eye redness, ear pain, ringing in the ears, wax buildup, runny nose, nasal congestion, bloody nose, or sore throat. Respiratory: Denies difficulty breathing, shortness  of breath, cough or sputum production.   Cardiovascular: Denies chest pain, chest tightness, palpitations or swelling in the hands or feet.  Gastrointestinal: Denies abdominal pain, bloating, constipation, diarrhea or blood in the stool.  GU: Denies urgency, frequency, pain with urination, burning sensation, blood in urine, odor or discharge. Musculoskeletal: Patient reports chronic joint pain.  Denies decrease in range of motion, difficulty with gait, muscle pain or joint swelling.  Skin: Denies redness, rashes, lesions or ulcercations.  Neurological: Denies dizziness, difficulty with memory, difficulty with speech or problems with balance and coordination.  Psych: Patient has a history of anxiety and depression.  Denies SI/HI.  No other specific complaints in a complete review of systems (except as listed in HPI above).   Objective:    Vision:  Uncorrected Corrected Horizontal Field of Vision  Right Eye 20/30  70 degrees  Left Eye  20/25  70 degrees  Both Eyes  20/25     Applicant can recognize and distinguish among traffic control signals and devices showing standard red, green, and amber colors.     Monocular Vision?: no   Hearing:        Right Ear  > 5 ft     Left Ear  > 3ft      BP 122/74 (BP Location: Left Arm, Patient Position: Sitting, Cuff Size: Large)   Pulse 83   Ht 5' 11 (1.803 m)   Wt 266 lb 6.4 oz (120.8 kg)   SpO2 97%   BMI 37.16 kg/m    Wt Readings from Last 3 Encounters:  09/10/23 267 lb (121.1 kg)  07/23/23 266 lb 6.4 oz (120.8 kg)   03/08/23 264 lb 12.8 oz (120.1 kg)    General: Appears his stated age, obese, in NAD. Skin: Warm, dry and intact.  HEENT: Head: normal shape and size; Eyes: sclera white, no icterus, conjunctiva pink, PERRLA and EOMs intact; Ears: Tm's gray and intact, normal light reflex; Nose: mucosa pink and moist, septum midline; Throat/Mouth: Teeth present, mucosa pink and moist, no exudate, lesions or ulcerations noted.  Neck:  Neck supple, trachea midline. No masses, lumps or thyromegaly present.  Cardiovascular: Normal rate and rhythm. S1,S2 noted.  No murmur, rubs or gallops noted. No JVD or BLE edema. No carotid bruits noted. Pulmonary/Chest: Normal effort and positive vesicular breath sounds. No respiratory distress. No wheezes, rales or ronchi noted.  Abdomen: Soft and nontender. Normal bowel sounds. No distention or masses noted. Liver, spleen and kidneys non palpable. Musculoskeletal: Normal flexion, rotation and lateral bending of spine.  Decreased extension of the cervical spine.  Joint enlargement noted in hands but he has normal dexterity.  Strength 5/5 BUE/BLE.  No difficulty with gait.  Neurological: Alert and oriented. Cranial nerves II-XII grossly intact. Coordination normal.  Psychiatric: Mood and affect normal. Behavior is normal. Judgment and thought content normal.    BMET    Component Value Date/Time   NA 138 07/23/2023 1408   NA 141 02/02/2019 0834   K 4.2 07/23/2023 1408   CL 99 07/23/2023 1408   CO2 29 07/23/2023 1408   GLUCOSE 125 07/23/2023 1408   BUN 15 07/23/2023 1408   BUN 18 02/02/2019 0834   CREATININE 1.11 07/23/2023 1408   CALCIUM  9.6 07/23/2023 1408   GFRNONAA >60 05/01/2022 1517   GFRNONAA 62 01/01/2020 1549   GFRAA 72 01/01/2020 1549    Lipid Panel     Component Value Date/Time   CHOL 147 07/23/2023 1408   CHOL 149 02/02/2019  0834   TRIG 151 (H) 07/23/2023 1408   HDL 56 07/23/2023 1408   HDL 67 02/02/2019 0834   CHOLHDL 2.6 07/23/2023 1408   VLDL  23 01/08/2015 0225   LDLCALC 68 07/23/2023 1408    CBC    Component Value Date/Time   WBC 8.6 07/23/2023 1408   RBC 4.89 07/23/2023 1408   HGB 15.3 07/23/2023 1408   HCT 47.0 07/23/2023 1408   PLT 309 07/23/2023 1408   MCV 96.1 07/23/2023 1408   MCH 31.3 07/23/2023 1408   MCHC 32.6 07/23/2023 1408   RDW 12.1 07/23/2023 1408   LYMPHSABS 1,554 01/01/2020 1549   MONOABS 0.6 08/27/2018 1501   EOSABS 333 01/01/2020 1549   BASOSABS 37 01/01/2020 1549    Hgb A1C Lab Results  Component Value Date   HGBA1C 5.9 (H) 07/23/2023        Labs:  Urinalysis:  Specific gravity: 1.015  Blood: Negative  Protein: Negative  Glucose: Negative    Assessment:    Healthy male exam.  Meets standards, but periodic monitoring required due to HTN.  Driver qualified only for 1 year.    Plan:    Medical examiners certificate completed and printed. Return as needed.   Angeline Laura, NP

## 2024-01-24 ENCOUNTER — Encounter: Payer: Self-pay | Admitting: Oncology

## 2024-01-24 ENCOUNTER — Encounter: Payer: Self-pay | Admitting: Internal Medicine

## 2024-01-24 ENCOUNTER — Ambulatory Visit (INDEPENDENT_AMBULATORY_CARE_PROVIDER_SITE_OTHER): Admitting: Internal Medicine

## 2024-01-24 VITALS — BP 124/74 | Ht 71.0 in | Wt 271.0 lb

## 2024-01-24 DIAGNOSIS — M1A0710 Idiopathic chronic gout, right ankle and foot, without tophus (tophi): Secondary | ICD-10-CM | POA: Diagnosis not present

## 2024-01-24 DIAGNOSIS — I252 Old myocardial infarction: Secondary | ICD-10-CM

## 2024-01-24 DIAGNOSIS — R7303 Prediabetes: Secondary | ICD-10-CM

## 2024-01-24 DIAGNOSIS — G4733 Obstructive sleep apnea (adult) (pediatric): Secondary | ICD-10-CM | POA: Diagnosis not present

## 2024-01-24 DIAGNOSIS — M961 Postlaminectomy syndrome, not elsewhere classified: Secondary | ICD-10-CM | POA: Diagnosis not present

## 2024-01-24 DIAGNOSIS — F331 Major depressive disorder, recurrent, moderate: Secondary | ICD-10-CM | POA: Insufficient documentation

## 2024-01-24 DIAGNOSIS — K219 Gastro-esophageal reflux disease without esophagitis: Secondary | ICD-10-CM

## 2024-01-24 DIAGNOSIS — M150 Primary generalized (osteo)arthritis: Secondary | ICD-10-CM

## 2024-01-24 DIAGNOSIS — E782 Mixed hyperlipidemia: Secondary | ICD-10-CM | POA: Diagnosis not present

## 2024-01-24 DIAGNOSIS — I10 Essential (primary) hypertension: Secondary | ICD-10-CM

## 2024-01-24 DIAGNOSIS — N400 Enlarged prostate without lower urinary tract symptoms: Secondary | ICD-10-CM

## 2024-01-24 DIAGNOSIS — N522 Drug-induced erectile dysfunction: Secondary | ICD-10-CM

## 2024-01-24 DIAGNOSIS — F411 Generalized anxiety disorder: Secondary | ICD-10-CM

## 2024-01-24 DIAGNOSIS — N529 Male erectile dysfunction, unspecified: Secondary | ICD-10-CM | POA: Insufficient documentation

## 2024-01-24 DIAGNOSIS — I25118 Atherosclerotic heart disease of native coronary artery with other forms of angina pectoris: Secondary | ICD-10-CM | POA: Diagnosis not present

## 2024-01-24 MED ORDER — BUPROPION HCL ER (XL) 150 MG PO TB24: 150.0000 mg | ORAL_TABLET | Freq: Every day | ORAL | 1 refills | Status: AC

## 2024-01-24 NOTE — Assessment & Plan Note (Signed)
 Complicated by morbid obesity Continue celecoxib  200 mg twice daily, duloxetine  60 mg daily, voltaren  gel, pregabalin  75 mg 3 times daily and oxycodone  5 mg every 4 hours as needed for pain management Encourage weight loss as this can help reduce joint pain He will continue to follow with pain management

## 2024-01-24 NOTE — Patient Instructions (Signed)

## 2024-01-24 NOTE — Assessment & Plan Note (Signed)
 Complicated by morbid obesity Continue valsartan  HCT 160-12.5 mg daily Reinforce DASH diet and exercise for weight loss C-Met today

## 2024-01-24 NOTE — Assessment & Plan Note (Signed)
 Complicated by morbid obesity Encourage weight loss as this can reduce sleep apnea symptoms Continue BiPAP

## 2024-01-24 NOTE — Assessment & Plan Note (Signed)
 Complicated by morbid obesity Continue celebrex  200 mg twice daily, duloxetine  60 mg daily, voltaren  gel, pregabalin  75 mg 3 times daily and oxycodone  5 mg every 4 hours as needed for pain management Encourage weight loss as this can help reduce joint pain Encouraged regular stretching and core strengthening

## 2024-01-24 NOTE — Progress Notes (Signed)
 "  Subjective:    Patient ID: Robert Greer, male    DOB: Mar 06, 1955, 69 y.o.   MRN: [id number]  HPI  Patient presents to clinic today for follow-up of chronic conditions.  HTN: His BP today is 124/74.  He is taking valsartan  hct as prescribed.  ECG from 02/2023 reviewed.  OSA: He averages 7 hours of sleep per night with the use of his BiPAP.  Sleep study from 01/2021 reviewed.  GERD: He is not sure what triggers this. He takes omeprazole  only as needed.  Upper GI from 01/2017 reviewed.  OA/chronic low back pain: Generalized.  He takes celecoxib , duloxetine , uses voltaren  gel, pregabalin , oxycodone  as needed with good relief of symptoms.  He does not follow with orthopedics but he follows with pain clinic.  HLD with CAD status post MI: Status post stents. His last LDL was 68, triglycerides 848, 07/2023.  He denies myalgias on atorvastatin , ezetimibe .  He is taking clopidogrel  as well but is not longer taking A1C.  Prediabetes: His last A1c was 5.9 %, 07/2023.  He is not taking any oral diabetic medication this time.  He does not check his sugars.  Anxiety and depression (moderate, recurrent): Chronic, managed on duloxetine  and bupropion . He is not currently seeing a therapist. He denies SI/HI.  Gout: He denies recent flare. He has colchicine  to take if needed. He does not follow with rheumatology.  ED: He has difficulty initiating and maintaining erection.  He is not currently taking anything for this but has tried sildenafil in the past however it caused a severe headache.  He does not follow with urology.  Review of Systems   Past Medical History:  Diagnosis Date   Anxiety    Arthritis    Back pain    CAD (coronary artery disease)    a.) NSTEMI 01/07/2015; b.) LHC/PCI 01/10/2015: LM nl, LAD 85p/m (overlapping 2.75 x 22 mm (pLAD), 2.5 x 22 mm (mLAD), and 2.75 x 12 mm (mLAD) Resolute Integrity DES), D1 50ost, D2/3 small/nl, LCX 60d, OM1 nl, OM2 min irregs, OM3 nl, RCA 159m (likely culprit  w/ L->R, R->R collats-->Med Rx). Nl EF.   Complication of anesthesia 2010   a.) cardiac arrest in OR during back surgery done in [place]; never told me why   Depressive disorder, not elsewhere classified    GERD (gastroesophageal reflux disease)    Gout    History of echocardiogram    a. 12/2014 Echo: EF 60-65%, no rwma, mild MR. Nl RV fxn.   Hyperlipidemia    Hypertension    Lipoma of back 02/09/2015   Long term current use of antithrombotics/antiplatelets    a.) on DAPT (ASA + clopidogrel )   Measles    Mobitz (type) II atrioventricular block    Mumps    NSTEMI (non-ST elevated myocardial infarction) (HCC) 01/07/2015   a.) troponins were trended: 0.63 --> 3.16 --> 10.08 --> 9.73; b.) LHC/PCI 01/10/2015 --> overlapping 2.75 x 22 mm (pLAD), 2.5 x 22 mm (mLAD), and 2.75 x 12 mm (mLAD) Resolute Integrity DES   Obesity    OSA treated with BiPAP    Other hemochromatosis 12/05/2016    Current Outpatient Medications  Medication Sig Dispense Refill   aspirin  81 MG chewable tablet Chew 1 tablet (81 mg total) by mouth in the morning and at bedtime. 60 tablet 0   atorvastatin  (LIPITOR) 20 MG tablet Take 1 tablet (20 mg total) by mouth daily. 90 tablet 3   buPROPion  (WELLBUTRIN  XL) 150 MG  24 hr tablet Take 1 tablet (150 mg total) by mouth daily. 90 tablet 0   celecoxib  (CELEBREX ) 200 MG capsule Take 1 capsule (200 mg total) by mouth 2 (two) times daily. 60 capsule 1   cetirizine  (ZYRTEC ) 10 MG tablet Take 1 tablet (10 mg total) by mouth as needed for allergies. Reported on 02/09/2015 90 tablet 1   clopidogrel  (PLAVIX ) 75 MG tablet Take 1 tablet (75 mg total) by mouth daily. 90 tablet 3   colchicine  0.6 MG tablet Take 2 tabs at the first sign of a gout attack followed by 1 tab 1 hour later. 30 tablet 0   diclofenac  Sodium (VOLTAREN ) 1 % GEL Apply 2 g topically 4 (four) times daily. (Patient not taking: Reported on 12/24/2023) 100 g 1   DULoxetine  (CYMBALTA ) 60 MG capsule Take 1 capsule (60 mg total)  by mouth 2 (two) times daily. 180 capsule 0   ezetimibe  (ZETIA ) 10 MG tablet Take 1 tablet (10 mg total) by mouth daily. 90 tablet 3   omeprazole  (PRILOSEC) 20 MG capsule Take 1 capsule (20 mg total) by mouth daily. 90 capsule 1   oxyCODONE  (OXY IR/ROXICODONE ) 5 MG immediate release tablet Take 1 tablet (5 mg total) by mouth every 4 (four) hours as needed for moderate pain (pain score 4-6). 30 tablet 0   pregabalin  (LYRICA ) 75 MG capsule Take 75 mg by mouth 3 (three) times daily.     valACYclovir  (VALTREX ) 1000 MG tablet Take 1 tablet (1,000 mg total) by mouth 3 (three) times daily. For shingles (Patient not taking: Reported on 12/24/2023) 21 tablet 0   valsartan -hydrochlorothiazide  (DIOVAN -HCT) 160-12.5 MG tablet Take 1 tablet by mouth daily. 90 tablet 3   No current facility-administered medications for this visit.    Allergies  Allergen Reactions   Levaquin  [Levofloxacin ] Rash    Family History  Problem Relation Age of Onset   Stroke Mother    Liver disease Father    Lung cancer Sister    Diabetes Sister    Liver disease Brother    Lung cancer Paternal Grandmother     Social History   Socioeconomic History   Marital status: Married    Spouse name: Idell   Number of children: Not on file   Years of education: Not on file   Highest education level: Not on file  Occupational History   Not on file  Tobacco Use   Smoking status: Never   Smokeless tobacco: Current    Types: Snuff  Vaping Use   Vaping status: Never Used  Substance and Sexual Activity   Alcohol use: Yes    Alcohol/week: 2.0 - 3.0 standard drinks of alcohol    Types: 2 - 3 Cans of beer per week    Comment: ocassionally   Drug use: No   Sexual activity: Yes  Other Topics Concern   Not on file  Social History Narrative   Not on file   Social Drivers of Health   Tobacco Use: High Risk (12/24/2023)   Patient History    Smoking Tobacco Use: Never    Smokeless Tobacco Use: Current    Passive Exposure: Not  on file  Financial Resource Strain: Low Risk (06/21/2023)   Overall Financial Resource Strain (CARDIA)    Difficulty of Paying Living Expenses: Not hard at all  Food Insecurity: No Food Insecurity (06/21/2023)   Hunger Vital Sign    Worried About Running Out of Food in the Last Year: Never true    Ran  Out of Food in the Last Year: Never true  Transportation Needs: No Transportation Needs (06/21/2023)   PRAPARE - Administrator, Civil Service (Medical): No    Lack of Transportation (Non-Medical): No  Physical Activity: Insufficiently Active (06/21/2023)   Exercise Vital Sign    Days of Exercise per Week: 3 days    Minutes of Exercise per Session: 30 min  Stress: No Stress Concern Present (06/21/2023)   [employer] of Occupational Health - Occupational Stress Questionnaire    Feeling of Stress : Only a little  Social Connections: Moderately Integrated (06/21/2023)   Social Connection and Isolation Panel    Frequency of Communication with Friends and Family: More than three times a week    Frequency of Social Gatherings with Friends and Family: More than three times a week    Attends Religious Services: More than 4 times per year    Active Member of [employer] or Organizations: No    Attends Banker Meetings: Never    Marital Status: Married  Catering Manager Violence: Not At Risk (06/21/2023)   Humiliation, Afraid, Rape, and Kick questionnaire    Fear of Current or Ex-Partner: No    Emotionally Abused: No    Physically Abused: No    Sexually Abused: No  Depression (PHQ2-9): Low Risk (06/21/2023)   Depression (PHQ2-9)    PHQ-2 Score: 0  Alcohol Screen: Low Risk (06/21/2023)   Alcohol Screen    Last Alcohol Screening Score (AUDIT): 1  Housing: Unknown (06/21/2023)   Housing Stability Vital Sign    Unable to Pay for Housing in the Last Year: No    Number of Times Moved in the Last Year: Not on file    Homeless in the Last Year: No  Utilities: Not At Risk  (06/21/2023)   AHC Utilities    Threatened with loss of utilities: No  Health Literacy: Adequate Health Literacy (06/21/2023)   B1300 Health Literacy    Frequency of need for help with medical instructions: Never     Constitutional: Denies fever, malaise, fatigue, headache or abrupt weight changes.  HEENT: Denies eye pain, eye redness, ear pain, ringing in the ears, wax buildup, runny nose, nasal congestion, bloody nose, or sore throat. Respiratory: Denies difficulty breathing, shortness of breath, cough or sputum production.   Cardiovascular: Denies chest pain, chest tightness, palpitations or swelling in the hands or feet.  Gastrointestinal: Pt reports intermittent reflux. Denies abdominal pain, bloating, constipation, diarrhea or blood in the stool.  GU: Patient reports erectile dysfunction.  Denies urgency, frequency, pain with urination, burning sensation, blood in urine, odor or discharge. Musculoskeletal: Pt reports chronic low back pain, joint pain. Denies decrease in range of motion, difficulty with gait, muscle pain or joint swelling.  Skin: Denies redness, rashes, lesions or ulcercations.  Neurological: Denies dizziness, difficulty with memory, difficulty with speech or problems with balance and coordination.  Psych: Pt has a history of anxiety and depression. Denies SI/HI.  No other specific complaints in a complete review of systems (except as listed in HPI above).     Objective:   Physical Exam  There were no vitals taken for this visit.   Wt Readings from Last 3 Encounters:  12/24/23 266 lb 6.4 oz (120.8 kg)  09/10/23 267 lb (121.1 kg)  07/23/23 266 lb 6.4 oz (120.8 kg)    General: Appears his stated age, obese, in NAD. Skin: Warm, dry and intact.  PVD changes noted of lower extremities without ulceration. HEENT:  Head: normal shape and size; Eyes: sclera white, no icterus, conjunctiva pink, PERRLA and EOMs intact;  Cardiovascular: Normal rate and rhythm. S1,S2  noted.  No murmur, rubs or gallops noted. No JVD or BLE edema. No carotid bruits noted. Pulmonary/Chest: Normal effort and positive vesicular breath sounds. No respiratory distress. No wheezes, rales or ronchi noted.  Abdomen: Normal bowel sounds.  Musculoskeletal: Joint enlargement noted of bilateral hands.  Status post bilateral knee replacement.  No difficulty with gait.  Neurological: Alert and oriented.  Psych: Mood and affect normal.  Behavior is normal.  Judgment and thought content normal.  BMET    Component Value Date/Time   NA 138 07/23/2023 1408   NA 141 02/02/2019 0834   K 4.2 07/23/2023 1408   CL 99 07/23/2023 1408   CO2 29 07/23/2023 1408   GLUCOSE 125 07/23/2023 1408   BUN 15 07/23/2023 1408   BUN 18 02/02/2019 0834   CREATININE 1.11 07/23/2023 1408   CALCIUM  9.6 07/23/2023 1408   GFRNONAA >60 05/01/2022 1517   GFRNONAA 62 01/01/2020 1549   GFRAA 72 01/01/2020 1549    Lipid Panel     Component Value Date/Time   CHOL 147 07/23/2023 1408   CHOL 149 02/02/2019 0834   TRIG 151 (H) 07/23/2023 1408   HDL 56 07/23/2023 1408   HDL 67 02/02/2019 0834   CHOLHDL 2.6 07/23/2023 1408   VLDL 23 01/08/2015 0225   LDLCALC 68 07/23/2023 1408    CBC    Component Value Date/Time   WBC 8.6 07/23/2023 1408   RBC 4.89 07/23/2023 1408   HGB 15.3 07/23/2023 1408   HCT 47.0 07/23/2023 1408   PLT 309 07/23/2023 1408   MCV 96.1 07/23/2023 1408   MCH 31.3 07/23/2023 1408   MCHC 32.6 07/23/2023 1408   RDW 12.1 07/23/2023 1408   LYMPHSABS 1,554 01/01/2020 1549   MONOABS 0.6 08/27/2018 1501   EOSABS 333 01/01/2020 1549   BASOSABS 37 01/01/2020 1549    Hgb A1C Lab Results  Component Value Date   HGBA1C 5.9 (H) 07/23/2023           Assessment & Plan:     RTC in 6 months, follow up chronic conditions Angeline Laura, NP  "

## 2024-01-24 NOTE — Assessment & Plan Note (Signed)
 Complicated by morbid obesity C-Met and lipid profile today Continue atorvastatin  20 mg, ezetimibe  10 mg, and plavix  75 mg daily Encouraged low fat diet

## 2024-01-24 NOTE — Assessment & Plan Note (Signed)
 Complicated by morbid obesity Try to identify and avoid foods that trigger reflux Encourage weight loss as this can help reduce reflux symptoms Continue omeprazole  20 mg daily as needed Okay to continue rolaids OTC as needed

## 2024-01-24 NOTE — Assessment & Plan Note (Signed)
 Uric acid level today Continue colchicine  0.6 mg as needed  Encouraged low purine diet

## 2024-01-24 NOTE — Assessment & Plan Note (Signed)
 Complicated by morbid obesity. C-Met and lipid profile today Continue atorvastatin  20 mg, ezetimibe  10 mg, and plavix  75 mg daily as prescribed Encouraged low fat diet

## 2024-01-24 NOTE — Assessment & Plan Note (Signed)
 Continue duloxetine  60 mg and bupropion  150 mg daily Support offered

## 2024-01-24 NOTE — Assessment & Plan Note (Signed)
 He declines starting additional medication therapy at this time due to side effects

## 2024-01-24 NOTE — Assessment & Plan Note (Signed)
 Continue duloxetine  60 mg and bupropion  150 mg XL daily Support offered

## 2024-01-24 NOTE — Assessment & Plan Note (Signed)
 Encouraged diet and exercise for weight loss ?

## 2024-01-24 NOTE — Assessment & Plan Note (Signed)
 Complicated by morbid obesity A1c today Encourage low-carb diet and exercise for weight loss

## 2024-01-25 LAB — COMPREHENSIVE METABOLIC PANEL WITH GFR
AG Ratio: 1.8 (calc) (ref 1.0–2.5)
ALT: 22 U/L (ref 9–46)
AST: 19 U/L (ref 10–35)
Albumin: 4.4 g/dL (ref 3.6–5.1)
Alkaline phosphatase (APISO): 67 U/L (ref 35–144)
BUN: 17 mg/dL (ref 7–25)
CO2: 28 mmol/L (ref 20–32)
Calcium: 8.9 mg/dL (ref 8.6–10.3)
Chloride: 104 mmol/L (ref 98–110)
Creat: 1.07 mg/dL (ref 0.70–1.28)
Globulin: 2.4 g/dL (ref 1.9–3.7)
Glucose, Bld: 121 mg/dL — ABNORMAL HIGH (ref 65–99)
Potassium: 4.4 mmol/L (ref 3.5–5.3)
Sodium: 139 mmol/L (ref 135–146)
Total Bilirubin: 0.5 mg/dL (ref 0.2–1.2)
Total Protein: 6.8 g/dL (ref 6.1–8.1)
eGFR: 75 mL/min/1.73m2

## 2024-01-25 LAB — CBC
HCT: 41.1 % (ref 39.4–51.1)
Hemoglobin: 13.9 g/dL (ref 13.2–17.1)
MCH: 32.1 pg (ref 27.0–33.0)
MCHC: 33.8 g/dL (ref 31.6–35.4)
MCV: 94.9 fL (ref 81.4–101.7)
MPV: 9.6 fL (ref 7.5–12.5)
Platelets: 300 Thousand/uL (ref 140–400)
RBC: 4.33 Million/uL (ref 4.20–5.80)
RDW: 12.1 % (ref 11.0–15.0)
WBC: 7.2 Thousand/uL (ref 3.8–10.8)

## 2024-01-25 LAB — LIPID PANEL
Cholesterol: 133 mg/dL
HDL: 59 mg/dL
LDL Cholesterol (Calc): 48 mg/dL
Non-HDL Cholesterol (Calc): 74 mg/dL
Total CHOL/HDL Ratio: 2.3 (calc)
Triglycerides: 181 mg/dL — ABNORMAL HIGH

## 2024-01-25 LAB — PSA: PSA: 0.75 ng/mL

## 2024-01-25 LAB — URIC ACID: Uric Acid, Serum: 7.2 mg/dL (ref 4.0–8.0)

## 2024-01-25 LAB — HEMOGLOBIN A1C
Hgb A1c MFr Bld: 6 % — ABNORMAL HIGH
Mean Plasma Glucose: 126 mg/dL
eAG (mmol/L): 7 mmol/L

## 2024-01-27 ENCOUNTER — Ambulatory Visit: Payer: Self-pay | Admitting: Internal Medicine

## 2024-02-17 ENCOUNTER — Other Ambulatory Visit: Payer: Self-pay | Admitting: Internal Medicine

## 2024-02-18 NOTE — Telephone Encounter (Signed)
 Requested Prescriptions  Pending Prescriptions Disp Refills   omeprazole  (PRILOSEC) 20 MG capsule [Pharmacy Med Name: OMEPRAZOLE  DR 20 MG CAPSULE] 90 capsule 0    Sig: Take 1 capsule (20 mg total) by mouth daily.     Gastroenterology: Proton Pump Inhibitors Passed - 02/18/2024  9:42 AM      Passed - Valid encounter within last 12 months    Recent Outpatient Visits           3 weeks ago MDD (major depressive disorder), recurrent episode, moderate (HCC)   [facility] [place], Angeline ORN, NP   1 month ago Encounter for Department of Transportation (DOT) examination for driving license renewal   [facility] [place], [place] W, NP   5 months ago Herpes zoster without complication   [place] [facility] [place], Angeline ORN, NP   7 months ago Prediabetes   [facility] [place], Angeline ORN, [place]

## 2024-06-26 ENCOUNTER — Ambulatory Visit

## 2024-07-01 ENCOUNTER — Ambulatory Visit

## 2024-07-23 ENCOUNTER — Ambulatory Visit: Admitting: Internal Medicine
